# Patient Record
Sex: Male | Born: 1940 | Race: Black or African American | Hispanic: No | Marital: Married | Smoking: Former smoker
Health system: Southern US, Community
[De-identification: ages and names within clinical notes are randomized; demographics above are authoritative.]

## PROBLEM LIST (undated history)

## (undated) DIAGNOSIS — I739 Peripheral vascular disease, unspecified: Secondary | ICD-10-CM

## (undated) DIAGNOSIS — K2970 Gastritis, unspecified, without bleeding: Secondary | ICD-10-CM

## (undated) DIAGNOSIS — B9681 Helicobacter pylori [H. pylori] as the cause of diseases classified elsewhere: Secondary | ICD-10-CM

## (undated) DIAGNOSIS — N529 Male erectile dysfunction, unspecified: Secondary | ICD-10-CM

## (undated) DIAGNOSIS — N400 Enlarged prostate without lower urinary tract symptoms: Secondary | ICD-10-CM

## (undated) DIAGNOSIS — K219 Gastro-esophageal reflux disease without esophagitis: Secondary | ICD-10-CM

## (undated) DIAGNOSIS — D649 Anemia, unspecified: Secondary | ICD-10-CM

## (undated) DIAGNOSIS — D509 Iron deficiency anemia, unspecified: Secondary | ICD-10-CM

## (undated) DIAGNOSIS — Z86010 Personal history of colonic polyps: Secondary | ICD-10-CM

## (undated) DIAGNOSIS — J449 Chronic obstructive pulmonary disease, unspecified: Secondary | ICD-10-CM

## (undated) DIAGNOSIS — E785 Hyperlipidemia, unspecified: Secondary | ICD-10-CM

## (undated) DIAGNOSIS — T783XXA Angioneurotic edema, initial encounter: Secondary | ICD-10-CM

## (undated) DIAGNOSIS — Z87891 Personal history of nicotine dependence: Secondary | ICD-10-CM

## (undated) DIAGNOSIS — K589 Irritable bowel syndrome without diarrhea: Secondary | ICD-10-CM

## (undated) HISTORY — DX: Anemia, unspecified: D64.9

## (undated) HISTORY — PX: OTHER SURGICAL HISTORY: SHX169

## (undated) HISTORY — DX: Angioneurotic edema, initial encounter: T78.3XXA

## (undated) HISTORY — DX: Benign prostatic hyperplasia without lower urinary tract symptoms: N40.0

## (undated) HISTORY — DX: Peripheral vascular disease, unspecified: I73.9

## (undated) HISTORY — DX: Hyperlipidemia, unspecified: E78.5

## (undated) HISTORY — DX: Chronic obstructive pulmonary disease, unspecified: J44.9

## (undated) HISTORY — DX: Irritable bowel syndrome, unspecified: K58.9

## (undated) HISTORY — DX: Gastro-esophageal reflux disease without esophagitis: K21.9

## (undated) HISTORY — DX: Gastritis, unspecified, without bleeding: K29.70

## (undated) HISTORY — DX: Male erectile dysfunction, unspecified: N52.9

## (undated) HISTORY — DX: Personal history of nicotine dependence: Z87.891

## (undated) HISTORY — DX: Helicobacter pylori (H. pylori) as the cause of diseases classified elsewhere: B96.81

## (undated) HISTORY — DX: Iron deficiency anemia, unspecified: D50.9

## (undated) HISTORY — DX: Personal history of colonic polyps: Z86.010

## (undated) SURGERY — COLONOSCOPY
Anesthesia: Moderate Sedation

---

## 1978-04-12 HISTORY — PX: OTHER SURGICAL HISTORY: SHX169

## 1998-03-12 ENCOUNTER — Inpatient Hospital Stay (HOSPITAL_COMMUNITY): Admission: EM | Admit: 1998-03-12 | Discharge: 1998-03-13 | Payer: Self-pay | Admitting: Emergency Medicine

## 2001-08-16 ENCOUNTER — Encounter: Admission: RE | Admit: 2001-08-16 | Discharge: 2001-08-16 | Payer: Self-pay | Admitting: Urology

## 2001-08-16 ENCOUNTER — Encounter: Payer: Self-pay | Admitting: Urology

## 2001-08-17 ENCOUNTER — Ambulatory Visit (HOSPITAL_BASED_OUTPATIENT_CLINIC_OR_DEPARTMENT_OTHER): Admission: RE | Admit: 2001-08-17 | Discharge: 2001-08-17 | Payer: Self-pay | Admitting: Urology

## 2001-08-17 ENCOUNTER — Encounter: Payer: Self-pay | Admitting: Urology

## 2005-10-18 ENCOUNTER — Ambulatory Visit: Payer: Self-pay | Admitting: Internal Medicine

## 2005-10-19 ENCOUNTER — Ambulatory Visit: Payer: Self-pay | Admitting: Internal Medicine

## 2006-04-27 ENCOUNTER — Ambulatory Visit: Payer: Self-pay | Admitting: Internal Medicine

## 2006-06-02 ENCOUNTER — Ambulatory Visit: Payer: Self-pay | Admitting: Internal Medicine

## 2006-06-16 ENCOUNTER — Ambulatory Visit: Payer: Self-pay

## 2007-01-03 ENCOUNTER — Encounter: Payer: Self-pay | Admitting: Internal Medicine

## 2007-01-03 ENCOUNTER — Ambulatory Visit: Payer: Self-pay | Admitting: Internal Medicine

## 2007-01-03 DIAGNOSIS — E785 Hyperlipidemia, unspecified: Secondary | ICD-10-CM

## 2007-01-03 DIAGNOSIS — F528 Other sexual dysfunction not due to a substance or known physiological condition: Secondary | ICD-10-CM | POA: Insufficient documentation

## 2007-01-03 LAB — CONVERTED CEMR LAB
BUN: 7 mg/dL (ref 6–23)
CO2: 30 meq/L (ref 19–32)
Calcium: 9.8 mg/dL (ref 8.4–10.5)
Chloride: 104 meq/L (ref 96–112)
Cholesterol: 202 mg/dL (ref 0–200)
Creatinine, Ser: 1.1 mg/dL (ref 0.4–1.5)
Direct LDL: 138.3 mg/dL
GFR calc Af Amer: 86 mL/min
GFR calc non Af Amer: 71 mL/min
Glucose, Bld: 99 mg/dL (ref 70–99)
HDL: 45.9 mg/dL (ref >39.0)
Potassium: 4.7 meq/L (ref 3.5–5.1)
Sodium: 138 meq/L (ref 135–145)
Testosterone: 428.76 ng/dL (ref 350.00–890)
Total CHOL/HDL Ratio: 4.4
Triglycerides: 107 mg/dL (ref 0–149)
VLDL: 21 mg/dL (ref 0–40)

## 2007-02-01 ENCOUNTER — Ambulatory Visit: Payer: Self-pay | Admitting: Internal Medicine

## 2007-02-01 ENCOUNTER — Encounter: Payer: Self-pay | Admitting: Internal Medicine

## 2007-02-01 DIAGNOSIS — I10 Essential (primary) hypertension: Secondary | ICD-10-CM | POA: Insufficient documentation

## 2007-05-19 ENCOUNTER — Ambulatory Visit: Payer: Self-pay | Admitting: Internal Medicine

## 2007-05-19 DIAGNOSIS — I739 Peripheral vascular disease, unspecified: Secondary | ICD-10-CM | POA: Insufficient documentation

## 2007-05-19 DIAGNOSIS — D649 Anemia, unspecified: Secondary | ICD-10-CM

## 2007-06-09 ENCOUNTER — Ambulatory Visit: Payer: Self-pay | Admitting: Internal Medicine

## 2007-07-06 ENCOUNTER — Ambulatory Visit: Payer: Self-pay | Admitting: Internal Medicine

## 2007-10-20 ENCOUNTER — Ambulatory Visit: Payer: Self-pay | Admitting: Internal Medicine

## 2007-10-20 DIAGNOSIS — N400 Enlarged prostate without lower urinary tract symptoms: Secondary | ICD-10-CM

## 2007-10-20 DIAGNOSIS — K589 Irritable bowel syndrome without diarrhea: Secondary | ICD-10-CM

## 2007-11-10 ENCOUNTER — Ambulatory Visit: Payer: Self-pay | Admitting: Internal Medicine

## 2008-01-23 ENCOUNTER — Ambulatory Visit: Payer: Self-pay | Admitting: Internal Medicine

## 2008-02-14 ENCOUNTER — Telehealth: Payer: Self-pay | Admitting: Internal Medicine

## 2008-04-15 ENCOUNTER — Ambulatory Visit: Payer: Self-pay | Admitting: Internal Medicine

## 2008-06-07 ENCOUNTER — Telehealth (INDEPENDENT_AMBULATORY_CARE_PROVIDER_SITE_OTHER): Payer: Self-pay | Admitting: *Deleted

## 2008-06-13 ENCOUNTER — Ambulatory Visit: Payer: Self-pay | Admitting: Internal Medicine

## 2008-06-13 DIAGNOSIS — R252 Cramp and spasm: Secondary | ICD-10-CM | POA: Insufficient documentation

## 2008-09-11 ENCOUNTER — Ambulatory Visit: Payer: Self-pay | Admitting: Endocrinology

## 2008-10-02 ENCOUNTER — Telehealth (INDEPENDENT_AMBULATORY_CARE_PROVIDER_SITE_OTHER): Payer: Self-pay | Admitting: *Deleted

## 2008-10-03 ENCOUNTER — Ambulatory Visit: Payer: Self-pay

## 2008-10-03 ENCOUNTER — Encounter: Payer: Self-pay | Admitting: Cardiology

## 2008-11-12 ENCOUNTER — Telehealth: Payer: Self-pay | Admitting: Internal Medicine

## 2008-11-12 ENCOUNTER — Ambulatory Visit: Payer: Self-pay | Admitting: Internal Medicine

## 2008-11-13 ENCOUNTER — Telehealth (INDEPENDENT_AMBULATORY_CARE_PROVIDER_SITE_OTHER): Payer: Self-pay | Admitting: *Deleted

## 2008-11-26 ENCOUNTER — Ambulatory Visit: Payer: Self-pay | Admitting: Cardiovascular Disease

## 2008-11-26 DIAGNOSIS — I70219 Atherosclerosis of native arteries of extremities with intermittent claudication, unspecified extremity: Secondary | ICD-10-CM

## 2008-12-02 ENCOUNTER — Encounter: Payer: Self-pay | Admitting: Cardiovascular Disease

## 2008-12-02 ENCOUNTER — Ambulatory Visit: Payer: Self-pay

## 2008-12-06 ENCOUNTER — Ambulatory Visit: Payer: Self-pay | Admitting: Cardiovascular Disease

## 2008-12-06 ENCOUNTER — Encounter: Payer: Self-pay | Admitting: Cardiology

## 2008-12-17 ENCOUNTER — Ambulatory Visit: Payer: Self-pay | Admitting: Cardiovascular Disease

## 2008-12-18 ENCOUNTER — Ambulatory Visit (HOSPITAL_COMMUNITY): Admission: RE | Admit: 2008-12-18 | Discharge: 2008-12-18 | Payer: Self-pay | Admitting: Cardiovascular Disease

## 2008-12-18 ENCOUNTER — Ambulatory Visit: Payer: Self-pay | Admitting: Cardiovascular Disease

## 2008-12-18 LAB — CONVERTED CEMR LAB
BUN: 11 mg/dL (ref 6–23)
Basophils Absolute: 0 10*3/uL (ref 0.0–0.1)
Basophils Relative: 0.4 % (ref 0.0–3.0)
CO2: 30 meq/L (ref 19–32)
Calcium: 9.3 mg/dL (ref 8.4–10.5)
Chloride: 104 meq/L (ref 96–112)
Creatinine, Ser: 1.1 mg/dL (ref 0.4–1.5)
Eosinophils Absolute: 0.1 10*3/uL (ref 0.0–0.7)
Eosinophils Relative: 2.6 % (ref 0.0–5.0)
GFR calc non Af Amer: 85.75 mL/min (ref >60)
Glucose, Bld: 100 mg/dL — ABNORMAL HIGH (ref 70–99)
HCT: 39.3 % (ref 39.0–52.0)
Hemoglobin: 13.7 g/dL (ref 13.0–17.0)
INR: 0.9 (ref 0.8–1.0)
Lymphocytes Relative: 20.4 % (ref 12.0–46.0)
Lymphs Abs: 1.2 10*3/uL (ref 0.7–4.0)
MCHC: 34.8 g/dL (ref 30.0–36.0)
MCV: 100.5 fL — ABNORMAL HIGH (ref 78.0–100.0)
Monocytes Absolute: 0.6 10*3/uL (ref 0.1–1.0)
Monocytes Relative: 11.1 % (ref 3.0–12.0)
Neutro Abs: 3.8 10*3/uL (ref 1.4–7.7)
Neutrophils Relative %: 65.5 % (ref 43.0–77.0)
Platelets: 178 10*3/uL (ref 150.0–400.0)
Potassium: 4.3 meq/L (ref 3.5–5.1)
Prothrombin Time: 9.2 s (ref 9.1–11.7)
RBC: 3.91 M/uL — ABNORMAL LOW (ref 4.22–5.81)
RDW: 13.9 % (ref 11.5–14.6)
Sodium: 139 meq/L (ref 135–145)
WBC: 5.7 10*3/uL (ref 4.5–10.5)
aPTT: 33 s — ABNORMAL HIGH (ref 21.7–28.8)

## 2008-12-19 ENCOUNTER — Encounter: Payer: Self-pay | Admitting: Cardiovascular Disease

## 2009-01-01 ENCOUNTER — Encounter (INDEPENDENT_AMBULATORY_CARE_PROVIDER_SITE_OTHER): Payer: Self-pay | Admitting: *Deleted

## 2009-01-08 ENCOUNTER — Ambulatory Visit: Payer: Self-pay

## 2009-01-08 ENCOUNTER — Ambulatory Visit: Payer: Self-pay | Admitting: Cardiovascular Disease

## 2009-01-27 ENCOUNTER — Ambulatory Visit: Payer: Self-pay | Admitting: Cardiovascular Disease

## 2009-01-27 LAB — CONVERTED CEMR LAB
BUN: 13 mg/dL (ref 6–23)
Basophils Absolute: 0 10*3/uL (ref 0.0–0.1)
Basophils Relative: 0.5 % (ref 0.0–3.0)
CO2: 29 meq/L (ref 19–32)
Calcium: 9 mg/dL (ref 8.4–10.5)
Chloride: 102 meq/L (ref 96–112)
Creatinine, Ser: 1.1 mg/dL (ref 0.4–1.5)
Eosinophils Absolute: 0.1 10*3/uL (ref 0.0–0.7)
Eosinophils Relative: 2.9 % (ref 0.0–5.0)
GFR calc non Af Amer: 85.72 mL/min (ref >60)
Glucose, Bld: 86 mg/dL (ref 70–99)
HCT: 38.3 % — ABNORMAL LOW (ref 39.0–52.0)
Hemoglobin: 12.9 g/dL — ABNORMAL LOW (ref 13.0–17.0)
INR: 0.9 (ref 0.8–1.0)
Lymphocytes Relative: 29.4 % (ref 12.0–46.0)
Lymphs Abs: 1.2 10*3/uL (ref 0.7–4.0)
MCHC: 33.6 g/dL (ref 30.0–36.0)
MCV: 101.4 fL — ABNORMAL HIGH (ref 78.0–100.0)
Monocytes Absolute: 0.4 10*3/uL (ref 0.1–1.0)
Monocytes Relative: 10.6 % (ref 3.0–12.0)
Neutro Abs: 2.5 10*3/uL (ref 1.4–7.7)
Neutrophils Relative %: 56.6 % (ref 43.0–77.0)
Platelets: 219 10*3/uL (ref 150.0–400.0)
Potassium: 4.5 meq/L (ref 3.5–5.1)
Prothrombin Time: 9.2 s (ref 9.1–11.7)
RBC: 3.78 M/uL — ABNORMAL LOW (ref 4.22–5.81)
RDW: 13.9 % (ref 11.5–14.6)
Sodium: 140 meq/L (ref 135–145)
WBC: 4.2 10*3/uL — ABNORMAL LOW (ref 4.5–10.5)
aPTT: 32.2 s — ABNORMAL HIGH (ref 21.7–28.8)

## 2009-01-29 ENCOUNTER — Ambulatory Visit: Payer: Self-pay | Admitting: Cardiovascular Disease

## 2009-01-29 ENCOUNTER — Ambulatory Visit (HOSPITAL_COMMUNITY): Admission: RE | Admit: 2009-01-29 | Discharge: 2009-01-29 | Payer: Self-pay | Admitting: Cardiovascular Disease

## 2009-02-03 ENCOUNTER — Ambulatory Visit: Payer: Self-pay | Admitting: Internal Medicine

## 2009-02-04 ENCOUNTER — Telehealth: Payer: Self-pay | Admitting: Cardiovascular Disease

## 2009-02-05 ENCOUNTER — Telehealth (INDEPENDENT_AMBULATORY_CARE_PROVIDER_SITE_OTHER): Payer: Self-pay | Admitting: *Deleted

## 2009-02-18 ENCOUNTER — Ambulatory Visit: Payer: Self-pay | Admitting: Internal Medicine

## 2009-02-18 DIAGNOSIS — I872 Venous insufficiency (chronic) (peripheral): Secondary | ICD-10-CM | POA: Insufficient documentation

## 2009-02-19 ENCOUNTER — Encounter (INDEPENDENT_AMBULATORY_CARE_PROVIDER_SITE_OTHER): Payer: Self-pay | Admitting: *Deleted

## 2009-02-24 ENCOUNTER — Ambulatory Visit: Payer: Self-pay | Admitting: Surgery

## 2009-02-24 ENCOUNTER — Ambulatory Visit: Payer: Self-pay | Admitting: Cardiovascular Disease

## 2009-03-04 ENCOUNTER — Ambulatory Visit (HOSPITAL_COMMUNITY): Admission: RE | Admit: 2009-03-04 | Discharge: 2009-03-04 | Payer: Self-pay | Admitting: Surgery

## 2009-03-04 ENCOUNTER — Ambulatory Visit: Payer: Self-pay | Admitting: Surgery

## 2009-04-14 ENCOUNTER — Encounter: Payer: Self-pay | Admitting: Cardiovascular Disease

## 2009-04-14 ENCOUNTER — Ambulatory Visit: Payer: Self-pay | Admitting: Surgery

## 2009-05-26 ENCOUNTER — Telehealth: Payer: Self-pay | Admitting: Internal Medicine

## 2009-07-16 ENCOUNTER — Encounter: Payer: Self-pay | Admitting: Internal Medicine

## 2009-07-17 ENCOUNTER — Encounter: Payer: Self-pay | Admitting: Cardiovascular Disease

## 2009-07-17 ENCOUNTER — Ambulatory Visit: Payer: Self-pay

## 2009-07-18 ENCOUNTER — Ambulatory Visit: Payer: Self-pay | Admitting: Endocrinology

## 2009-07-18 DIAGNOSIS — M545 Low back pain, unspecified: Secondary | ICD-10-CM | POA: Insufficient documentation

## 2009-07-18 LAB — CONVERTED CEMR LAB
PSA: 2.58 ng/mL (ref 0.10–4.00)
Sed Rate: 16 mm/hr (ref 0–22)

## 2009-07-28 ENCOUNTER — Telehealth: Payer: Self-pay | Admitting: Internal Medicine

## 2009-10-14 ENCOUNTER — Ambulatory Visit: Payer: Self-pay | Admitting: Cardiovascular Disease

## 2009-10-14 ENCOUNTER — Encounter: Payer: Self-pay | Admitting: Surgery

## 2009-10-20 ENCOUNTER — Ambulatory Visit: Payer: Self-pay | Admitting: Internal Medicine

## 2009-10-20 DIAGNOSIS — G609 Hereditary and idiopathic neuropathy, unspecified: Secondary | ICD-10-CM | POA: Insufficient documentation

## 2009-10-20 LAB — CONVERTED CEMR LAB
Albumin: 3.7 g/dL (ref 3.5–5.2)
BUN: 11 mg/dL (ref 6–23)
Basophils Absolute: 0 10*3/uL (ref 0.0–0.1)
Basophils Relative: 0.8 % (ref 0.0–3.0)
CO2: 28 meq/L (ref 19–32)
Calcium: 9.4 mg/dL (ref 8.4–10.5)
Chloride: 110 meq/L (ref 96–112)
Creatinine, Ser: 1 mg/dL (ref 0.4–1.5)
Eosinophils Absolute: 0.1 10*3/uL (ref 0.0–0.7)
Eosinophils Relative: 3.2 % (ref 0.0–5.0)
Folate: 12.3 ng/mL
GFR calc non Af Amer: 98.89 mL/min (ref >60)
Glucose, Bld: 120 mg/dL — ABNORMAL HIGH (ref 70–99)
HCT: 33.3 % — ABNORMAL LOW (ref 39.0–52.0)
Hemoglobin: 11.1 g/dL — ABNORMAL LOW (ref 13.0–17.0)
Lymphocytes Relative: 29.5 % (ref 12.0–46.0)
Lymphs Abs: 1 10*3/uL (ref 0.7–4.0)
MCHC: 33.3 g/dL (ref 30.0–36.0)
MCV: 90.6 fL (ref 78.0–100.0)
Monocytes Absolute: 0.4 10*3/uL (ref 0.1–1.0)
Monocytes Relative: 12.2 % — ABNORMAL HIGH (ref 3.0–12.0)
Neutro Abs: 1.9 10*3/uL (ref 1.4–7.7)
Neutrophils Relative %: 54.3 % (ref 43.0–77.0)
Phosphorus: 2.7 mg/dL (ref 2.3–4.6)
Platelets: 241 10*3/uL (ref 150.0–400.0)
Potassium: 4.5 meq/L (ref 3.5–5.1)
RBC: 3.68 M/uL — ABNORMAL LOW (ref 4.22–5.81)
RDW: 17.3 % — ABNORMAL HIGH (ref 11.5–14.6)
Sodium: 141 meq/L (ref 135–145)
TSH: 0.97 microintl units/mL (ref 0.35–5.50)
Vitamin B-12: 421 pg/mL (ref 211–911)
WBC: 3.6 10*3/uL — ABNORMAL LOW (ref 4.5–10.5)

## 2010-03-20 ENCOUNTER — Ambulatory Visit: Payer: Self-pay | Admitting: Internal Medicine

## 2010-03-20 ENCOUNTER — Telehealth: Payer: Self-pay | Admitting: Internal Medicine

## 2010-03-25 ENCOUNTER — Ambulatory Visit: Payer: Self-pay | Admitting: Internal Medicine

## 2010-03-26 ENCOUNTER — Encounter: Payer: Self-pay | Admitting: Internal Medicine

## 2010-03-31 ENCOUNTER — Encounter: Payer: Self-pay | Admitting: Internal Medicine

## 2010-04-01 ENCOUNTER — Telehealth: Payer: Self-pay | Admitting: Internal Medicine

## 2010-04-08 ENCOUNTER — Ambulatory Visit: Payer: Self-pay | Admitting: Pulmonary Disease

## 2010-04-08 DIAGNOSIS — J449 Chronic obstructive pulmonary disease, unspecified: Secondary | ICD-10-CM

## 2010-04-08 DIAGNOSIS — J4489 Other specified chronic obstructive pulmonary disease: Secondary | ICD-10-CM | POA: Insufficient documentation

## 2010-05-12 NOTE — Assessment & Plan Note (Signed)
Summary: f1m per check/rsc from [facility] in May/lg   Visit Type:  Follow-up Primary Provider:  Illene Regulus  CC:  No complaints.  History of Present Illness: 70 yo AAM with h/o hyperlipidemia, tobacco abuse and PVD followed in our PV clinic. He had abnormal ABI studies in 2008 with both in mild to moderate range. (0.75 right, 0.81 left). He was started on Pletal but his symptoms did not improve.  He continued to smoke. Pain in both legs recently worsened.  Cramping at night, not clearly rest pain but present occasionally. No ulcerations or loss of sensation. 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 Lower extremity angiography performed on December 18, 2008 and at that time, he was found to have total occlusions of both mid SFAs. I was able to open the CTO of the left SFA and placed 2 self expanding stents in the mid to distal SFA into the left popliteal. I planned on staging the PTA of the right SFA at a later date.  I then attempted to open the CTO of the right SFA on 01/29/09 but was unable to cross the stenosis with a wire. I discussed his case with Dr. Myra Gianotti of Vascular Surgery. He was  limited by right leg pain and is unable to get through his activities of daily living without severe pain. He was seen by Dr. Myra Gianotti and PTA was arranged. Dr. Myra Gianotti was able to open the CTO of the right SFA  and placed two stents. He has been doing great. No pain in either leg. His hips get "tired" at the end of the day. No rest pain or ulcerations. He has been walking without any difficulty.  He has completely stopped smoking.    Current Medications (verified): 1)  Aspirin Ec Low Dose 81 Mg  Tbec (Aspirin) .... Once Daily 2)  Cialis 20 Mg  Tabs (Tadalafil) .... As Needed 3)  Doxazosin Mesylate 4 Mg  Tabs (Doxazosin Mesylate) .... Take One Tablet By Mouth Twice Daily. 4)  Gabapentin 100 Mg Caps (Gabapentin) .Marland Kitchen.. 1 Caps  1 Hour Before Bedtime For Nocturnal Leg Cramps 5)  Plavix 75 Mg Tabs (Clopidogrel Bisulfate) .... Take One Tablet By Mouth Daily 6)  Vitamin C 100 Mg Tabs (Ascorbic Acid) 7)  Vitamin D3 1000 Unit  Tabs (Cholecalciferol) .Marland Kitchen.. 1 By Mouth Daily 8)  Hydrocodone-Acetaminophen 10-325 Mg Tabs (Hydrocodone-Acetaminophen) .Marland Kitchen.. 1 Every 4 Hrs As Needed For Pain  Allergies (verified): 1)  ! Penicillin 2)  ! Ace Inhibitors  Past History:  Past Medical History: PERIPHERAL VASCULAR DISEASE (ICD-443.9) s/p PTA/stent left SFA 9/10, right SFA PTA/stent 02/2009 CHEST PAIN (ICD-786.50) ANGIOEDEMA (ICD-995.1) ESSENTIAL HYPERTENSION (ICD-401.9) HYPERLIPIDEMIA (ICD-272.4) HX, PERSONAL, TOBACCO USE (ICD-V15.82) CALF CRAMPS, NOCTURNAL (ICD-729.82) DIARRHEA (ICD-787.91) IRRITABLE BOWEL SYNDROME (ICD-564.1) BENIGN PROSTATIC HYPERTROPHY, WITH URINARY OBSTRUCTION (ICD-600.01) ANEMIA-NOS (ICD-285.9) PHARYNGITIS, ACUTE (ICD-462) IMPOTENCE (ICD-302.72)  Review of Systems  The patient denies fatigue, malaise, fever, weight gain/loss, vision loss, decreased hearing, hoarseness, chest pain, palpitations, shortness of breath, prolonged cough, wheezing, sleep apnea, coughing up blood, abdominal pain, blood in stool, nausea, vomiting, diarrhea, heartburn, incontinence, blood in urine, muscle weakness, joint pain, leg swelling, rash, skin lesions, headache, fainting, dizziness, depression, anxiety, enlarged lymph nodes, easy bruising or bleeding, and environmental allergies.    Vital Signs:  Patient profile:   70 year old male Height:      69 inches Weight:      186 pounds BMI:  27.57 Pulse rate:   58 / minute Resp:     18 per minute BP sitting:   142 / 82  (left arm)  Vitals Entered By: Marrion Coy, CNA (October 14, 2009 1:51 PM)  Physical Exam  General:  General: Well developed, well nourished, NAD HEENT: OP clear, mucus membranes moist SKIN: warm, dry Neuro: No focal deficits Musculoskeletal: Muscle  strength 5/5 all ext Psychiatric: Mood and affect normal Neck: No JVD CV: RRR no murmurs, gallops rubs Abdomen: soft, NT, ND, BS present Extremities: No edema, pulses 1+ bilateral DP/PT    Arterial Doppler  Procedure date:  10/14/2009  Findings:      ABI 1.0 on right, 0.99 on left.   Impression & Recommendations:  Problem # 1:  PERIPHERAL VASCULAR DISEASE (ICD-443.9) Stable. ABI normal. No claudication. Repeat ABI in 6 months. Continue ASA and Plavix and walking daily.   Patient Instructions: 1)  Your physician recommends that you schedule a follow-up appointment in: 12 months 2)  Your physician has requested that you have an ankle brachial index (ABI). During this test an ultrasound and blood pressure cuff are used to evaluate the arteries that supply the arms and legs with blood. Allow thirty minutes for this exam. There are no restrictions or special instructions. To be done in 6 months

## 2010-05-12 NOTE — Progress Notes (Signed)
  Phone Note Call from Patient Call back at Home Phone [phone]   Caller: Patient--walkin Call For: Jacques Navy MD Summary of Call: Pt walked in,requests to be seen today for cramps in hand & feet. Pt states he has given him pills to treat this before. Initial call taken by: Verdell Face,  March 20, 2010 10:31 AM  Follow-up for Phone Call        office visit today, ok per md Follow-up by: Lamar Sprinkles, CMA,  March 20, 2010 11:04 AM

## 2010-05-12 NOTE — Assessment & Plan Note (Signed)
Summary: SWOLLEN FEET WITH BURNING/NWS   Vital Signs:  Patient profile:   70 year old male Height:      69 inches Weight:      189 pounds BMI:     28.01 O2 Sat:      98 % on Room air Temp:     96.9 degrees F oral Pulse rate:   89 / minute BP sitting:   124 / 70  (left arm) Cuff size:   regular  Vitals Entered By: Bill Salinas CMA (October 20, 2009 9:21 AM)  O2 Flow:  Room air CC: pt here with c/o swelling and tingling in his feet x 2 days/ ab   Primary Care Provider:  Illene Regulus  CC:  pt here with c/o swelling and tingling in his feet x 2 days/ ab.  History of Present Illness: Awoke in the AM with swollen feet. The swelling went down with elevation. He also feels tingling in the feet. No swelling this AM. No lab since October '10. Renal function was normal. On CBC MCV was elevated.  Patient with h/o PVD s/p bilateral LE PCI/stenting with good results. Recent f/o Dr. Sanjuana Kava - doing well with normal ABI's  Current Medications (verified): 1)  Aspirin Ec Low Dose 81 Mg  Tbec (Aspirin) .... Once Daily 2)  Cialis 20 Mg  Tabs (Tadalafil) .... As Needed 3)  Doxazosin Mesylate 4 Mg  Tabs (Doxazosin Mesylate) .... Take One Tablet By Mouth Twice Daily. 4)  Gabapentin 100 Mg Caps (Gabapentin) .Marland Kitchen.. 1 Caps 1 Hour Before Bedtime For Nocturnal Leg Cramps 5)  Plavix 75 Mg Tabs (Clopidogrel Bisulfate) .... Take One Tablet By Mouth Daily 6)  Vitamin C 100 Mg Tabs (Ascorbic Acid) 7)  Vitamin D3 1000 Unit  Tabs (Cholecalciferol) .Marland Kitchen.. 1 By Mouth Daily 8)  Hydrocodone-Acetaminophen 10-325 Mg Tabs (Hydrocodone-Acetaminophen) .Marland Kitchen.. 1 Every 4 Hrs As Needed For Pain  Allergies (verified): 1)  ! Penicillin 2)  ! Ace Inhibitors  Past History:  Past Medical History: Last updated: 10/14/2009 PERIPHERAL VASCULAR DISEASE (ICD-443.9) s/p PTA/stent left SFA 9/10, right SFA PTA/stent 02/2009 CHEST PAIN (ICD-786.50) ANGIOEDEMA (ICD-995.1) ESSENTIAL HYPERTENSION (ICD-401.9) HYPERLIPIDEMIA  (ICD-272.4) HX, PERSONAL, TOBACCO USE (ICD-V15.82) CALF CRAMPS, NOCTURNAL (ICD-729.82) DIARRHEA (ICD-787.91) IRRITABLE BOWEL SYNDROME (ICD-564.1) BENIGN PROSTATIC HYPERTROPHY, WITH URINARY OBSTRUCTION (ICD-600.01) ANEMIA-NOS (ICD-285.9) PHARYNGITIS, ACUTE (ICD-462) IMPOTENCE (ICD-302.72)  Past Surgical History: Last updated: 01/01/2009 Abdominal surgery for ulcers 1. Distal aortogram with bilateral lower extremity runoff.  2. Percutaneous transluminal angioplasty with placement of 2 self- expanding stents in the distal left superficial femoral artery extending into the left popliteal artery.Verne Carrow, MD. 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  The patient denies anorexia, weight loss, chest pain, dyspnea on exertion, abdominal pain, muscle weakness, and difficulty walking.    Physical Exam  General:  WNWD AA male in no distress Head:  normocephalic and atraumatic.   Eyes:  C&S clear Lungs:  Normal respiratory effort, chest expands symmetrically. Lungs are clear to auscultation, no crackles or wheezes. Heart:  Normal rate and regular rhythm. S1 and S2 normal without gallop, murmur, click, rub or other extra sounds. Pulses:  2+ DP pulses Extremities:  trace to 1+ edema distal LE left > right Skin:  turgor normal and color normal.   Psych:  normally interactive, good eye contact, and  not anxious appearing.     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 Possible B12 deficiency vs thyroid disease vs other  Plan - B12 level, TSH  Orders: TLB-Renal Function Panel (80069-RENAL) TLB-B12 + Folate Pnl (16109_60454-U98/JXB) TLB-TSH (Thyroid Stimulating Hormone) (84443-TSH) TLB-CBC Platelet - w/Differential (85025-CBCD)  Addendum  = normal labs  Tests: (2) B12 + Folate Panel (B12/FOL)   Vitamin B12               421 pg/mL                   211-911   Folate                    12.3 ng/mL     Deficient  0.4 - 3.4 ng/mL     Indeterminate  3.4 - 5.4 ng/mL     Normal  >5.4 ng/mL  Tests: (3) TSH (TSH)   FastTSH                   0.97 uIU/mL                 0.35-5.50  Problem # 2:  VENOUS INSUFFICIENCY, LEFT LEG (ICD-459.81) H/o venous insufficiency but has had recent episode of AM ankle edema. Exam is unrevealing.  Plan - r/o renal disease with metabolic panel.  Addendum - renal function normal.  Problem # 3:  PERIPHERAL VASCULAR DISEASE (ICD-443.9) S/P PCI with stents - doing well. No symptoms of claudication, normal ABI's. He has quit smoking!!!  Problem # 4:  ESSENTIAL HYPERTENSION (ICD-401.9)  His updated medication list for this problem includes:    Doxazosin Mesylate 4 Mg Tabs (Doxazosin mesylate) .Marland Kitchen... Take one tablet by mouth twice daily.  BP today: 124/70 Prior BP: 142/82 (10/14/2009)  Good control on present medications.   Complete Medication List: 1)  Aspirin Ec Low Dose 81 Mg Tbec (Aspirin) .... Once daily 2)  Cialis 20 Mg Tabs (Tadalafil) .... As needed 3)  Doxazosin Mesylate 4 Mg Tabs (Doxazosin mesylate) .... Take one tablet by mouth twice daily. 4)  Gabapentin 100 Mg Caps (Gabapentin) .Marland Kitchen.. 1 caps 1 hour before bedtime for nocturnal leg cramps 5)  Plavix 75 Mg Tabs (Clopidogrel bisulfate) .... Take one tablet by mouth daily 6)  Vitamin C 100 Mg Tabs (Ascorbic acid) 7)  Vitamin D3 1000 Unit Tabs (Cholecalciferol) .Marland Kitchen.. 1 by mouth daily 8)  Hydrocodone-acetaminophen 10-325 Mg Tabs (Hydrocodone-acetaminophen) .Marland Kitchen.. 1 every 4 hrs as needed for pain   Preventive Care Screening  Last Flu Shot:    Date:  10/20/2009    Results:  Declined  Last Tetanus Booster:    Date:  07/09/2007    Results:  Historical

## 2010-05-12 NOTE — Progress Notes (Signed)
  Phone Note Refill Request Message from:  Fax from Pharmacy on July 28, 2009 1:48 PM  Refills Requested: Medication #1:  GABAPENTIN 100 MG CAPS 1 caps 1 hour before bedtime for nocturnal leg cramps Initial call taken by: Rock Nephew CMA,  July 28, 2009 1:48 PM    Prescriptions: GABAPENTIN 100 MG CAPS (GABAPENTIN) 1 caps 1 hour before bedtime for nocturnal leg cramps  #60 x 6   Entered by:   Rock Nephew CMA   Authorized by:   Jacques Navy MD   Signed by:   Rock Nephew CMA on 07/28/2009   Method used:   Electronically to        [facility] [phone]* (retail)       [address]       [place], [place]  [address]       Ph: [phone] or [phone]       Fax: [phone]   RxID:   [id number]

## 2010-05-12 NOTE — Miscellaneous (Signed)
Summary: Orders Update  Clinical Lists Changes  Orders: Added new Test order of Arterial Duplex Lower Extremity (Arterial Duplex Low) - Signed 

## 2010-05-12 NOTE — Letter (Signed)
Summary: [facility]   Imported By: Roderic Ovens 04/23/2009 15:20:29  _____________________________________________________________________  External Attachment:    Type:   Image     Comment:   External Document  Appended Document: [facility]  Pat, Can we make sure that Mr. Chauca has surveillance lower extremity arterial dopplers arranged for April 2011. He will need these every three months for one year per request of Dr. Myra Gianotti. Thanks, CDM  Appended Document: [facility]  Pt. notified of need for LEA doppler in April 2011.  Pt. transferred to scheduler and appt made for 07/17/09 for this.

## 2010-05-12 NOTE — Letter (Signed)
[facility] [address] [place], [place]  [address] Phone: [phone]      October 26, 2009   Henry Carter [address] [place], [place] [address]  RE:  LAB RESULTS  Dear  Mr. Henry Carter,  The following is an interpretation of your most recent lab tests.  Please take note of any instructions provided or changes to medications that have resulted from your lab work.  ELECTROLYTES:  Good - no changes needed  KIDNEY FUNCTION TESTS:  Good - no changes needed   THYROID STUDIES:  Thyroid studies normal TSH: 0.97     B12 and folate normal   Labs are normal. Tingling my be a result of peripheral vascular disease or it my be idiopathic (no ready medical explanation). For worseing symptoms may need further testing.    Sincerely Yours,    Jacques Navy MD  Patient: Henry Carter Note: All result statuses are Final unless otherwise noted.  Tests: (1) Renal Function Panel (RENAL)   Sodium                    141 mEq/L                   135-145   Potassium                 4.5 mEq/L                   3.5-5.1   Chloride                  110 mEq/L                   96-112   Carbon Dioxide            28 mEq/L                    19-32   Calcium                   9.4 mg/dL                   4.0-34.7   Albumin                   3.7 g/dL                    4.2-5.9   BUN                       11 mg/dL                    5-63   Creatinine                1.0 mg/dL                   8.7-5.6   Glucose              [H]  120 mg/dL                   43-32   Phosphorus                2.7 mg/dL                   9.5-1.8   GFR  98.89 mL/min                >60  Tests: (2) B12 + Folate Panel (B12/FOL)   Vitamin B12               421 pg/mL                   211-911   Folate                    12.3 ng/mL     Deficient  0.4 - 3.4 ng/mL     Indeterminate  3.4 - 5.4 ng/mL     Normal  >5.4 ng/mL  Tests: (3) TSH (TSH)   FastTSH                    0.97 uIU/mL                 0.35-5.50  Tests: (4) CBC Platelet w/Diff (CBCD)   White Cell Count     [L]  3.6 K/uL                    4.5-10.5   Red Cell Count       [L]  3.68 Mil/uL                 4.22-5.81   Hemoglobin           [L]  11.1 g/dL                   69.6-29.5   Hematocrit           [L]  33.3 %                      39.0-52.0   MCV                       90.6 fl                     78.0-100.0   MCHC                      33.3 g/dL                   28.4-13.2   RDW                  [H]  17.3 %                      11.5-14.6   Platelet Count            241.0 K/uL                  150.0-400.0   Neutrophil %              54.3 %                      43.0-77.0   Lymphocyte %              29.5 %                      12.0-46.0   Monocyte %           [H]  12.2 %  3.0-12.0   Eosinophils%              3.2 %                       0.0-5.0   Basophils %               0.8 %                       0.0-3.0   Neutrophill Absolute      1.9 K/uL                    1.4-7.7   Lymphocyte Absolute       1.0 K/uL                    0.7-4.0   Monocyte Absolute         0.4 K/uL                    0.1-1.0  Eosinophils, Absolute                             0.1 K/uL                    0.0-0.7   Basophils Absolute        0.0 K/uL                    0.0-0.1

## 2010-05-12 NOTE — Assessment & Plan Note (Signed)
Summary: BACK PROBLEM/NWS   Vital Signs:  Patient profile:   70 year old male Height:      69 inches (175.26 cm) Weight:      184.13 pounds (83.70 kg) O2 Sat:      97 % on Room air Temp:     97.4 degrees F (36.33 degrees C) oral Pulse rate:   91 / minute BP sitting:   128 / 82  (left arm) Cuff size:   regular  Vitals Entered By: Josph Macho RMA (July 18, 2009 2:54 PM)  O2 Flow:  Room air CC: Back problems X1week/ pt states he fell twice this morning/ CF Is Patient Diabetic? No   Primary Provider:  Illene Regulus  CC:  Back problems X1week/ pt states he fell twice this morning/ CF.  History of Present Illness: pt states few days of pain at the lower back.  no associated numbness.  no injury.    Current Medications (verified): 1)  Aspirin Ec Low Dose 81 Mg  Tbec (Aspirin) .... Once Daily 2)  Cialis 20 Mg  Tabs (Tadalafil) .... As Needed 3)  Doxazosin Mesylate 4 Mg  Tabs (Doxazosin Mesylate) .... Take One Tablet By Mouth Twice Daily. 4)  Gabapentin 100 Mg Caps (Gabapentin) .Marland Kitchen.. 1 Caps 1 Hour Before Bedtime For Nocturnal Leg Cramps 5)  Plavix 75 Mg Tabs (Clopidogrel Bisulfate) .... Take One Tablet By Mouth Daily 6)  Vitamin C 100 Mg Tabs (Ascorbic Acid) 7)  Vitamin D3 1000 Unit  Tabs (Cholecalciferol) .Marland Kitchen.. 1 By Mouth Daily  Allergies (verified): 1)  ! Penicillin 2)  ! Ace Inhibitors  Past History:  Past Medical History: Last updated: 02/24/2009 PERIPHERAL VASCULAR DISEASE (ICD-443.9) s/p PTA/stent left SFA 9/10. CHEST PAIN (ICD-786.50) ANGIOEDEMA (ICD-995.1) ESSENTIAL HYPERTENSION (ICD-401.9) HYPERLIPIDEMIA (ICD-272.4) HX, PERSONAL, TOBACCO USE (ICD-V15.82) CALF CRAMPS, NOCTURNAL (ICD-729.82) DIARRHEA (ICD-787.91) IRRITABLE BOWEL SYNDROME (ICD-564.1) BENIGN PROSTATIC HYPERTROPHY, WITH URINARY OBSTRUCTION (ICD-600.01) ANEMIA-NOS (ICD-285.9) PHARYNGITIS, ACUTE (ICD-462) IMPOTENCE (ICD-302.72)  Review of Systems       denies decreased bowel or bladder  function.  Physical Exam  General:  normal appearance.   Msk:  back is nontender.  Neurologic:  sensation is intact to touch throughout the lower extremities.   Impression & Recommendations:  Problem # 1:  BACK PAIN, LUMBAR (ICD-724.2)  Medications Added to Medication List This Visit: 1)  Hydrocodone-acetaminophen 10-325 Mg Tabs (Hydrocodone-acetaminophen) .Marland Kitchen.. 1 every 4 hrs as needed for pain  Other Orders: TLB-Sedimentation Rate (ESR) (85652-ESR) TLB-PSA (Prostate Specific Antigen) (84153-PSA) T-Pelvis 1or 2 views (72170TC) T-Lumbar Spine 2 Views (72100TC) Est. Patient Level III (01027)  Patient Instructions: 1)  blood test, and x rays today. 2)  tests are being ordered for you today.  a few days after the test(s), please call [phone] to hear your test results. 3)  vicodin 10/325.  1 every 4 hrs as needed for pain. 4)  call dr Debby Bud next week if not feeling better. 5)  (update: i left message on phone-tree:  rx as we discussed.  x rays show osteoarthritis). Prescriptions: HYDROCODONE-ACETAMINOPHEN 10-325 MG TABS (HYDROCODONE-ACETAMINOPHEN) 1 every 4 hrs as needed for pain  #50 x 1   Entered and Authorized by:   Minus Breeding MD   Signed by:   Minus Breeding MD on 07/18/2009   Method used:   Print then Give to Patient   RxID:   [id number]

## 2010-05-12 NOTE — Progress Notes (Signed)
Summary: Doxazosin refill  Phone Note Refill Request Message from:  Fax from Pharmacy on May 26, 2009 4:12 PM  Refills Requested: Medication #1:  DOXAZOSIN MESYLATE 4 MG  TABS Take one tablet by mouth twice daily. Initial call taken by: Lucious Groves,  May 26, 2009 4:12 PM    Prescriptions: DOXAZOSIN MESYLATE 4 MG  TABS (DOXAZOSIN MESYLATE) Take one tablet by mouth twice daily.  #60 x 3   Entered by:   Lucious Groves   Authorized by:   Jacques Navy MD   Signed by:   Lucious Groves on 05/26/2009   Method used:   [facility] to        [facility] [phone][facility] (retail)       [address]       [place], [place]  [address]       Ph: [phone] or [phone]       Fax: [phone]   RxID:   [id number]

## 2010-05-14 NOTE — Assessment & Plan Note (Signed)
Summary: SOB/LED   Visit Type:  Initial Consult Copy to:  pcp Primary Provider/Referring Provider:  Illene Regulus  CC:  Pulmonary consult.  History of Present Illness: 70/M, ex smoker with DOE, quit smoking nov'10 - 25 Pyrs Had stents put in B LEs - claudication better Smokes marijuana - a blunt/ day - coughs white phlegm when he does this, No wheeze, PND, orthopnea  No recurrent colds, chest pain. His wife continues to smoke CXR  -hyperaeration PFTs - mild airway obstruction FEV1 70%,no BD response,  nml lung volumes,  preserved DLCO.   Preventive Screening-Counseling & Management  Alcohol-Tobacco     Smoking Status: quit < 6 months     Packs/Day: 0.5     Year Started: 1958     Year Quit: 01/2009  Current Medications (verified): 1)  Aspirin Ec Low Dose 81 Mg  Tbec (Aspirin) .... Once Daily 2)  Cialis 20 Mg  Tabs (Tadalafil) .... As Needed 3)  Doxazosin Mesylate 4 Mg  Tabs (Doxazosin Mesylate) .... Take One Tablet By Mouth Twice Daily. 4)  Gabapentin 300 Mg Caps (Gabapentin) .Marland Kitchen.. 1 By Mouth Three Times A Day 5)  Plavix 75 Mg Tabs (Clopidogrel Bisulfate) .... Take One Tablet By Mouth Daily 6)  Vitamin C 100 Mg Tabs (Ascorbic Acid) 7)  Vitamin D3 1000 Unit  Tabs (Cholecalciferol) .Marland Kitchen.. 1 By Mouth Daily 8)  Hydrocodone-Acetaminophen 10-325 Mg Tabs (Hydrocodone-Acetaminophen) .Marland Kitchen.. 1 Every 4 Hrs As Needed For Pain  Allergies (verified): 1)  ! Penicillin 2)  ! Ace Inhibitors  Past History:  Past Medical History: Last updated: 10/14/2009 PERIPHERAL VASCULAR DISEASE (ICD-443.9) s/p PTA/stent left SFA 9/10, right SFA PTA/stent 02/2009 CHEST PAIN (ICD-786.50) ANGIOEDEMA (ICD-995.1) ESSENTIAL HYPERTENSION (ICD-401.9) HYPERLIPIDEMIA (ICD-272.4) HX, PERSONAL, TOBACCO USE (ICD-V15.82) CALF CRAMPS, NOCTURNAL (ICD-729.82) DIARRHEA (ICD-787.91) IRRITABLE BOWEL SYNDROME (ICD-564.1) BENIGN PROSTATIC HYPERTROPHY, WITH URINARY OBSTRUCTION (ICD-600.01) ANEMIA-NOS  (ICD-285.9) PHARYNGITIS, ACUTE (ICD-462) IMPOTENCE (ICD-302.72)  Past Surgical History: Last updated: 01/01/2009 Abdominal surgery for ulcers 1. Distal aortogram with bilateral lower extremity runoff.  2. Percutaneous transluminal angioplasty with placement of 2 self- expanding stents in the distal left superficial femoral artery extending into the left popliteal artery.Verne Carrow, MD. 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  The patient denies shortness of breath at rest, non-productive cough, coughing up blood, chest pain, irregular heartbeats, indigestion, loss of appetite, abdominal pain, difficulty swallowing, sore throat, headaches, nasal congestion/difficulty breathing through nose, sneezing, itching, ear ache, anxiety, depression, rash, change in color of mucus, and fever.    Vital Signs:  Patient profile:   70 year old male Height:      69 inches Weight:      192.4 pounds BMI:     28.52 O2 Sat:      95 % on Room air Temp:     98.1 degrees F oral Pulse rate:   93 / minute BP sitting:   138 / 82  (left arm) Cuff size:   regular  Vitals Entered By: Zackery Barefoot CMA (April 08, 2010 10:57 AM)  O2 Flow:  Room  air CC: Pulmonary consult Comments Medications reviewed with patient Verified contact number and pharmacy with patient Zackery Barefoot CMA  April 08, 2010 11:02 AM    Physical Exam  Additional Exam:  Gen. Pleasant, thin, in no distress, normal  affect ENT - no lesions, no post nasal drip Neck: No JVD, no thyromegaly, no carotid bruits Lungs: no use of accessory muscles, no dullness to percussion, clear without rales or rhonchi  Cardiovascular: Rhythm regular, heart sounds  normal, no murmurs or gallops, no peripheral edema Abdomen: soft and non-tender, no hepatosplenomegaly, BS normal. Musculoskeletal: No deformities, no cyanosis or clubbing Neuro:  alert, non focal     Impression & Recommendations:  Problem # 1:  C O P D (ICD-496) Gold stg 2 Trial of spiriva- discussed side effects - he will call for Rx if this owrks I have asked him to cut down/ quit smoking marijuana Pulm rehab referral  Problem # 2:  DYSPNEA ON EXERTION (ICD-786.09)  Likely a combination of COPD & PVD  Orders: Consultation Level III (04540) Rehabilitation Referral (Rehab)  Patient Instructions: 1)  Copy sent to: dr Debby Bud 2)  Please schedule a follow-up appointment in 6 weeks. 3)  We have recommended that you participate in Pulmonary Rehabilitation Program.   4)  trial of spiriva - call me if this works

## 2010-05-14 NOTE — Progress Notes (Signed)
Summary: REQ FOR RX   Phone Note Call from Patient   Caller: Steward Drone Summary of Call: Steward Drone left vm for pt: Pt was advised to increase gabapentin and needs new rx to reflect changes. I do not see changes after looking at office notes, what is ok to fill?  Initial call taken by: Lamar Sprinkles, CMA,  April 01, 2010 3:20 PM  Follow-up for Phone Call        gabapentin 300mg : 1 at bedtime x 3 days, 1 two times a day x 3 days and then 1 three times a day. # 90, refill as needed. Follow-up by: Jacques Navy MD,  April 01, 2010 5:44 PM  Additional Follow-up for Phone Call Additional follow up Details #1::        Pt informed  Additional Follow-up by: Lamar Sprinkles, CMA,  April 01, 2010 6:19 PM    New/Updated Medications: GABAPENTIN 300 MG CAPS (GABAPENTIN) 1 by mouth three times a day Prescriptions: GABAPENTIN 300 MG CAPS (GABAPENTIN) 1 by mouth three times a day  #90 x 3   Entered and Authorized by:   Jacques Navy MD   Signed by:   Jacques Navy MD on 04/01/2010   Method used:   Electronically to        [facility] [phone]* (retail)       [address]       [place], [place]  [address]       Ph: [phone] or [phone]       Fax: [phone]   RxID:   [id number]

## 2010-05-14 NOTE — Miscellaneous (Signed)
Summary: Orders Update  Clinical Lists Changes  Orders: Added new Referral order of Pulmonary Referral (Pulmonary) - Signed 

## 2010-05-14 NOTE — Miscellaneous (Signed)
Summary: Orders Update pft charges  Clinical Lists Changes  Orders: Added new Service order of Carbon Monoxide diffusing w/capacity (94720) - Signed Added new Service order of Lung Volumes (94240) - Signed Added new Service order of Spirometry (Pre & Post) (94060) - Signed 

## 2010-05-14 NOTE — Assessment & Plan Note (Signed)
Summary: cramps hand,feet/ok Henry Carter/cd   Vital Signs:  Patient profile:   70 year old male Height:      69 inches Weight:      186 pounds BMI:     27.57 O2 Sat:      97 % on Room air Temp:     97.2 degrees F oral Pulse rate:   79 / minute BP sitting:   130 / 72  (left arm) Cuff size:   regular  Vitals Entered By: Bill Salinas CMA (March 20, 2010 11:18 AM)  O2 Flow:  Room air CC: pt here with c/o cramps in legs and feet he states cramps worse at night  when sleeping. He states gabapentin not as effective as it used to be/ ab   Primary Care Callen Zuba:  Illene Regulus  CC:  pt here with c/o cramps in legs and feet he states cramps worse at night  when sleeping. He states gabapentin not as effective as it used to be/ ab.  History of Present Illness: Patinet presents for follow -up. His chief complaint is nocturnal leg cramps that are getting worse. He has been taking gabapentin 100mg   at bedtime which isn't working like it used to.   Patient's primary concern is DOE which has gotten progressively worse. He has been not smoking for a year. Chart reviewed: no recent CXR, NST June '10 with a low risk study. He has had claudication and is s/p intevention and remains on plavix.  Current Medications (verified): 1)  Aspirin Ec Low Dose 81 Mg  Tbec (Aspirin) .... Once Daily 2)  Cialis 20 Mg  Tabs (Tadalafil) .... As Needed 3)  Doxazosin Mesylate 4 Mg  Tabs (Doxazosin Mesylate) .... Take One Tablet By Mouth Twice Daily. 4)  Gabapentin 100 Mg Caps (Gabapentin) .Marland Kitchen.. 1 Caps 1 Hour Before Bedtime For Nocturnal Leg Cramps 5)  Plavix 75 Mg Tabs (Clopidogrel Bisulfate) .... Take One Tablet By Mouth Daily 6)  Vitamin C 100 Mg Tabs (Ascorbic Acid) 7)  Vitamin D3 1000 Unit  Tabs (Cholecalciferol) .Marland Kitchen.. 1 By Mouth Daily 8)  Hydrocodone-Acetaminophen 10-325 Mg Tabs (Hydrocodone-Acetaminophen) .Marland Kitchen.. 1 Every 4 Hrs As Needed For Pain  Allergies (verified): 1)  ! Penicillin 2)  ! Ace Inhibitors  Past  History:  Past Medical History: Last updated: 10/14/2009 PERIPHERAL VASCULAR DISEASE (ICD-443.9) s/p PTA/stent left SFA 9/10, right SFA PTA/stent 02/2009 CHEST PAIN (ICD-786.50) ANGIOEDEMA (ICD-995.1) ESSENTIAL HYPERTENSION (ICD-401.9) HYPERLIPIDEMIA (ICD-272.4) HX, PERSONAL, TOBACCO USE (ICD-V15.82) CALF CRAMPS, NOCTURNAL (ICD-729.82) DIARRHEA (ICD-787.91) IRRITABLE BOWEL SYNDROME (ICD-564.1) BENIGN PROSTATIC HYPERTROPHY, WITH URINARY OBSTRUCTION (ICD-600.01) ANEMIA-NOS (ICD-285.9) PHARYNGITIS, ACUTE (ICD-462) IMPOTENCE (ICD-302.72)  Past Surgical History: Last updated: 01/01/2009 Abdominal surgery for ulcers 1. Distal aortogram with bilateral lower extremity runoff.  2. Percutaneous transluminal angioplasty with placement of 2 self- expanding stents in the distal left superficial femoral artery extending into the left popliteal artery.Verne Carrow, MD. 12/18/2008  FH reviewed for relevance, SH/Risk Factors reviewed for relevance  Review of Systems       The patient complains of dyspnea on exertion and peripheral edema.  The patient denies anorexia, fever, weight loss, hoarseness, chest pain, syncope, prolonged cough, headaches, abdominal pain, severe indigestion/heartburn, muscle weakness, suspicious skin lesions, difficulty walking, abnormal bleeding, and enlarged lymph nodes.    Physical Exam  General:  Well-developed,well-nourished,in no acute distress; alert,appropriate and cooperative throughout examination Head:  Normocephalic and atraumatic without obvious abnormalities. No apparent alopecia or balding. Lungs:  normal respiratory effort, no intercostal retractions, no accessory muscle use,  normal breath sounds, no crackles, and no wheezes.   Heart:  normal rate and regular rhythm.   Msk:  no joint tenderness, no joint swelling, and no redness over joints.   Pulses:  2+ radial Neurologic:  alert & oriented X3 and gait normal.   Skin:  turgor normal and color  normal.   Cervical Nodes:  no anterior cervical adenopathy and no posterior cervical adenopathy.   Psych:  Oriented X3, normally interactive, and good eye contact.     Impression & Recommendations:  Problem # 1:  DYSPNEA ON EXERTION (ICD-786.09) Has had cardiovascular work-up negative of CAD. He has known peripheral vascular disease s/p treatment/intervention. He does have a 50 pk year smoking history and suspect DOE is related to primary lung disease - emphysema  Plan - CXR           full PFTs pre and post bronchodilators.  Orders: T-2 View CXR (71020TC) Pulmonary Referral (Pulmonary)  Complete Medication List: 1)  Aspirin Ec Low Dose 81 Mg Tbec (Aspirin) .... Once daily 2)  Cialis 20 Mg Tabs (Tadalafil) .... As needed 3)  Doxazosin Mesylate 4 Mg Tabs (Doxazosin mesylate) .... Take one tablet by mouth twice daily. 4)  Gabapentin 100 Mg Caps (Gabapentin) .Marland Kitchen.. 1 caps 1 hour before bedtime for nocturnal leg cramps 5)  Plavix 75 Mg Tabs (Clopidogrel bisulfate) .... Take one tablet by mouth daily 6)  Vitamin C 100 Mg Tabs (Ascorbic acid) 7)  Vitamin D3 1000 Unit Tabs (Cholecalciferol) .Marland Kitchen.. 1 by mouth daily 8)  Hydrocodone-acetaminophen 10-325 Mg Tabs (Hydrocodone-acetaminophen) .Marland Kitchen.. 1 every 4 hrs as needed for pain  DG CHEST 2 VIEW - [id number]   Clinical Data: Shortness of breath with exertion, hypertension, former smoker   CHEST - 2 VIEW   Comparison: Chest x-ray of 10/19/2005   Findings: The lungs are clear but slightly hyperaerated. Mediastinal contours appear normal.  The heart is within normal limits in size.  No bony abnormality is seen.   IMPRESSION: Hyperaeration consistent with COPD.  No active lung disease.   Read By:  Juline Patch,  M.D.  Orders Added: 1)  T-2 View CXR [71020TC] 2)  Pulmonary Referral [Pulmonary] 3)  Est. Patient Level IV [54098]

## 2010-05-21 ENCOUNTER — Encounter: Payer: Self-pay | Admitting: Pulmonary Disease

## 2010-05-21 ENCOUNTER — Ambulatory Visit (INDEPENDENT_AMBULATORY_CARE_PROVIDER_SITE_OTHER): Payer: Self-pay | Admitting: Pulmonary Disease

## 2010-05-21 DIAGNOSIS — J449 Chronic obstructive pulmonary disease, unspecified: Secondary | ICD-10-CM

## 2010-05-28 NOTE — Assessment & Plan Note (Signed)
Summary: 6 week rov//sh   Visit Type:  Follow-up Copy to:  pcp Primary Provider/Referring Provider:  Illene Regulus  CC:  Pt states breathing is the same. c/o SOB with exertion.  History of Present Illness: 69/M, ex smoker with DOE, quit smoking nov'10 - 25 Pyrs Had stents put in B LEs - claudication better Smokes marijuana - a blunt/ day - coughs white phlegm when he does this, No wheeze, PND, orthopnea  No recurrent colds, chest pain. His wife continues to smoke CXR  -hyperaeration PFTs - mild airway obstruction FEV1 70%,no BD response,  nml lung volumes,  preserved DLCO.  May 21, 2010 1:51 PM  spiriva did not help. Has not been contacted by pulm rehab. feels his 'vigor' has decresaed.    Preventive Screening-Counseling & Management  Alcohol-Tobacco     Smoking Status: quit < 6 months     Smoking Cessation Counseling: yes     Packs/Day: 0.5     Year Started: 1957     Year Quit: 01/2009  Caffeine-Diet-Exercise     Caffeine use/day: 2     Does Patient Exercise: no     Exercise (avg: min/session): 5:15  Current Medications (verified): 1)  Aspirin Ec Low Dose 81 Mg  Tbec (Aspirin) .... Once Daily 2)  Cialis 20 Mg  Tabs (Tadalafil) .... As Needed 3)  Doxazosin Mesylate 4 Mg  Tabs (Doxazosin Mesylate) .... Take One Tablet By Mouth Twice Daily. 4)  Gabapentin 300 Mg Caps (Gabapentin) .... Take 1 Tablet By Mouth Two Times A Day 5)  Plavix 75 Mg Tabs (Clopidogrel Bisulfate) .... Take One Tablet By Mouth Daily  Allergies (verified): 1)  ! Penicillin 2)  ! Ace Inhibitors  Past History:  Past Medical History: Last updated: 10/14/2009 PERIPHERAL VASCULAR DISEASE (ICD-443.9) s/p PTA/stent left SFA 9/10, right SFA PTA/stent 02/2009 CHEST PAIN (ICD-786.50) ANGIOEDEMA (ICD-995.1) ESSENTIAL HYPERTENSION (ICD-401.9) HYPERLIPIDEMIA (ICD-272.4) HX, PERSONAL, TOBACCO USE (ICD-V15.82) CALF CRAMPS, NOCTURNAL (ICD-729.82) DIARRHEA (ICD-787.91) IRRITABLE BOWEL SYNDROME  (ICD-564.1) BENIGN PROSTATIC HYPERTROPHY, WITH URINARY OBSTRUCTION (ICD-600.01) ANEMIA-NOS (ICD-285.9) PHARYNGITIS, ACUTE (ICD-462) IMPOTENCE (ICD-302.72)  Social History: Last updated: 10/20/2009 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  The patient denies anorexia, fever, weight loss, weight gain, vision loss, decreased hearing, hoarseness, chest pain, syncope, peripheral edema, prolonged cough, headaches, hemoptysis, abdominal pain, melena, hematochezia, severe indigestion/heartburn, hematuria, muscle weakness, transient blindness, difficulty walking, depression, unusual weight change, abnormal bleeding, enlarged lymph nodes, and angioedema.    Vital Signs:  Patient profile:   70 year old male Height:      69 inches Weight:      196 pounds BMI:     29.05 O2 Sat:      93 % on Room air Temp:     98.0 degrees F oral Pulse rate:   76 / minute BP sitting:   124 / 80  (left arm) Cuff size:   regular  Vitals Entered By: Zackery Barefoot CMA (May 21, 2010 1:27 PM)  O2 Flow:  Room air CC: Pt states breathing is the same. c/o SOB with exertion Comments Medications reviewed with patient Verified contact number and pharmacy with patient Zackery Barefoot CMA  May 21, 2010 1:27 PM    Physical Exam  Additional Exam:  Gen. Pleasant, thin, in no distress, normal affect ENT - no lesions, no post  nasal drip Neck: No JVD, no thyromegaly, no carotid bruits Lungs: no use of accessory muscles, no dullness to percussion, clear without rales or rhonchi  Cardiovascular: Rhythm regular, heart sounds  normal, no murmurs or gallops, no peripheral edema Musculoskeletal: No deformities, no cyanosis or clubbing      Impression & Recommendations:  Problem # 1:  C O P D (ICD-496) Enroll in pulm rehab Use albuterol MDI as needed  only Follow yearly for lung function assessment  Medications Added to Medication List This Visit: 1)  Gabapentin 300 Mg Caps (Gabapentin) .... Take 1 tablet by mouth two times a day  Other Orders: Est. Patient Level II (11914)  Patient Instructions: 1)  Copy sent to:dr norins 2)  Please schedule a follow-up appointment in 1 year. 3)  Pulmonary rehab program  4)  Albuterol sample to use as needed

## 2010-08-25 NOTE — Procedures (Signed)
VASCULAR LAB EXAM   INDICATION:  Preop for peripheral vascular disease.   HISTORY:  Diabetes:  Cardiac:  Yes.  Hypertension:  Yes.   EXAM:  Duplex of bilateral great saphenous vein for bypass conduit.   IMPRESSION:  Patent bilateral great saphenous veins with normal size.  See attachment for drawings.   ___________________________________________  V. Charlena Cross, MD   MG/MEDQ  D:  02/25/2009  T:  02/25/2009  Job:  [id number]

## 2010-08-25 NOTE — Assessment & Plan Note (Signed)
OFFICE VISIT   GURVEER, COLUCCI  DOB:  June 02, 1940                                       02/24/2009  ZOXWR#:[id number]   REASON FOR VISIT:  Right leg claudication.   HISTORY:  This is a 70 year old gentleman that I am seeing at the  request of Dr. Clifton James for right leg claudication.  The patient has  been having symptoms for greater than 1-1/2 years that has gotten worse.  He has recently undergone subintimal recannulization on the left on  December 18, 2008.  This resulted in resolution of the patient's  symptoms on the left.  Shortly thereafter, the patient underwent an  attempt at recannulization of the right but was unsuccessful.  The  patient comes in today for surgical discussions.  He states that he  cannot walk more than 50 feet without having to stop secondary to cramps  in his legs.  He denies having any rest pain.  He denies having any  ulcers.  His symptoms are alleviated by rest, they are aggravated by  activity.   The patient has a history of tobacco abuse but quit 2 months ago after a  trial of Chantix.  He also suffers from hypercholesterolemia which is  under control.   REVIEW OF SYSTEMS:  Positive for shortness of breath with exertion,  reflux, hiatal hernia, frequent urination, pain in the legs with  walking, muscle pain.  All other review of systems are negative as  documented in the encounter form.   PAST MEDICAL HISTORY:  Hypercholesterolemia, peripheral vascular  disease, erectile dysfunction.   FAMILY HISTORY:  Negative for cardiovascular disease at an early age.   SOCIAL HISTORY:  He is married with 4 children and works in [employer]ural [employer].  He does not smoke.  Has a history smoking but quit 2 months  ago.  He drinks alcohol once weekly.   PHYSICAL EXAMINATION:  Heart rate is 52, blood pressure 132/91,  temperature is 98.4.  General:  Well-appearing, no distress.  HEENT:  Normocephalic, atraumatic.  Pupils equal.   Sclerae anicteric.  Neck:  Supple.  No JVD.  No carotid bruits.  Lungs:  Clear bilaterally.  No  rhonchi.  No wheezing.  Cardiovascular:  Regular rate and rhythm.  No  murmur.  Abdomen:  Soft, nontender.  Musculoskeletal:  Without major  deformities.  Extremities:  Warm, well-perfused.  There is no  ulceration.  Neuro:  Nonfocal.  Skin:  Without rash.   DIAGNOSTICS:  I have reviewed the patient's arteriograms performed by  Dr. Clifton James.  He has a total occlusion of the right with three-vessel  runoff.   ASSESSMENT/PLAN:  Right leg claudication.   PLAN:  I had a long conversation with the patient and his wife.  We  discussed proceeding with surgical bypass.  The patient and wife,  however, preferred to have a repeat attempt at recannulization  percutaneously.  I told them that I would attempt at performing  subintimal recanalization, and I discussed that since Dr. Clifton James was  unable to get across this lesion that it is likely that I would not be  able to as well.  However, they wish to try this prior to proceeding  with surgical bypass.  I have scheduled his arteriogram for Tuesday,  March 04, 2009.  We discussed the risks of distal embolization and  possible  emergent surgery.  He understands this.  If we are unable to  get across this, we would go ahead and proceed with surgical bypass  within the next week.  He is going to get a vein map today.   Jorge Ny, MD  Electronically Signed   VWB/MEDQ  D:  02/24/2009  T:  02/25/2009  Job:  [id number]   cc:   Verne Carrow, MD

## 2010-08-25 NOTE — Procedures (Signed)
NAME:  Henry Carter, Henry Carter NO.:  [id number]   MEDICAL RECORD NO.:  [id number]          PATIENT TYPE:  AMB   LOCATION:  SDS                          FACILITY:  [facility]   PHYSICIAN:  Verne Carrow, MDDATE OF BIRTH:  23-Apr-1940   DATE OF PROCEDURE:  DATE OF DISCHARGE:                    PERIPHERAL VASCULAR INVASIVE PROCEDURE   PROCEDURES PERFORMED:  1. Selective right superficial RA angiogram.  2. Attempted percutaneous intervention of the total occlusion of the      mid right superficial femoral artery.   OPERATOR:  Verne Carrow, MD   INDICATIONS:  Claudication in the right lower extremity.  This patient  underwent angioplasty of the CTO of the left superficial femoral artery  on December 18, 2008.  We were able to cross the occlusion at that time  and placed a stent.  His ABI normalized in his left leg.  He was brought  back today for attempted angioplasty of the chronic total occlusion of  the right superficial femoral artery.   DETAILS OF THE PROCEDURE:  The patient was brought to the peripheral  vascular laboratory after signing informed consent for the procedure.  He was placed supine on the cath table.  The left groin was prepped and  draped in sterile fashion.  Lidocaine 1% was used for local anesthesia.  A 7-French crossover sheath was then inserted into the left femoral  artery.  A crossover catheter was used to engage the wire around the  bifurcation of the aorta down into the right common femoral artery.  A  Versicore wire was then used to lead in front of the crossover catheter  as it was advanced around the bifurcation into the right common femoral  artery.  The dilator and the wire were then removed.  A slip catheter  was then advanced down to the tip of the sheath.  A glidewire was then  advanced down through the proximal portion of the superficial femoral  artery to the area in the midportion of the artery of the total  occlusion.  The  slip catheter was then advanced down to the total  occlusion.  I initially attempted to advance the glidewire but was  unable to find the true lumen.  We changed out the slip catheter for an  angled catheter.  I once again attempted to pass the glidewire but was  unable to find the true lumen.  We then placed a 0.14 mm wire down and  made multiple attempts to find the lumen of the vessel.  We were unable  to do so.  The procedure was aborted after 75 minutes.  The patient  tolerated the procedure well and was taken to the holding area in stable  condition.   IMPRESSION:  1. 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  2. Unsuccessful percutaneous attempt at revascularization of the total      occlusion of the mid right superficial femoral artery.   RECOMMENDATIONS:  The patient will be continued on aspirin and Plavix.  He will be encouraged to continue smoking cessation.  I will discuss  further with him, but will  make a surgical referral for a possible  surgical revascularization as this patient is severely limited in his  lifestyle by the claudication in his right leg.      Verne Carrow, MD  Electronically Signed     CM/MEDQ  D:  01/29/2009  T:  01/29/2009  Job:  [id number]

## 2010-08-25 NOTE — Assessment & Plan Note (Signed)
OFFICE VISIT   Henry Carter, Henry Carter  DOB:  September 23, 1940                                       04/14/2009  MVHQI#:[id number]   REASON FOR VISIT:  Followup.   HISTORY:  This is a 70 year old gentleman who underwent subintimal  recanalization of an occluded right superficial femoral and popliteal  artery, which were subsequently stented.  This was performed 03/04/2009.  The patient's left leg had been performed previously by Dr. Clifton James.  The patient is not having any problems at this time.  He does complain  of some swelling in his left ankle from where he dropped a [place] on it  several months ago.  He is concerned that this could be due to some  bleeding since he is on a significant amount of blood thinner.  Otherwise, however, the patient is not having claudication-like  symptoms.   PHYSICAL EXAMINATION:  Blood pressure is 131/82, pulse 79, respirations  20.  General:  He is well-appearing, in no distress.  HEENT:  Normocephalic, atraumatic.  Pupils equal.  Sclerae anicteric,  conjunctivae clear bilaterally.  Cardiovascular reveals a regular  rhythm.  Abdomen is soft, nontender.  Neurologic:  He has no focal  deficit.  Skin is without rash.  Musculoskeletal shows a swollen left  ankle with normal range of motion.  He has palpable pedal pulses.   DIAGNOSTIC STUDIES:  Ankle-brachial index was performed today.  He is  0.98 on the right, 1.1 on the left.   ASSESSMENT/PLAN:  Status post bilateral subintimal recanalization.   PLAN:  The patient will need surveillance screening ultrasound to be  done every 3 months for the first year.  Since he was initially Dr.  Gibson Ramp patient, I would be satisfied if this was performed at the  [facility] office as he is seeing him for other reasons as well.  As long  as I can get a copy of his ultrasound, that would be fine.  Otherwise, I  will plan to see him back in 1 year.     Jorge Ny, MD  Electronically  Signed   VWB/MEDQ  D:  04/14/2009  T:  04/16/2009  Job:  [id number]   cc:   Verne Carrow, MD

## 2010-08-28 NOTE — Assessment & Plan Note (Signed)
[facility]                           PRIMARY CARE OFFICE NOTE   Henry Carter, Henry Carter                     MRN:          [id number]  DATE:06/02/2006                            DOB:          1940-08-28    Henry Carter is a 70 year old African-American gentleman last seen October 19, 2005, for a complete physical exam; please see that dictation.  The  patient's past medical history is also well documented in the chart.   The patient presents acutely today because of chest discomfort that has  been getting worse over the last week or so.  He describes pain  underneath his left breast sharp in nature, rated as a 6-8/10.  He  reports that there will be no radiation to his neck or arm, he has had  shortness of breath, no diaphoresis.  There is no exercise relationship.  Duration will be 5-10 minutes.  He will sometimes have, unrelated to  this, some discomfort and tingling in his right arm which may lead to  some discomfort under his left breast.  The patient has had no  limitations in his activities because of this discomfort.  It has not  awakened him from sleep.   CARDIAC RISK FACTORS:  Male gender, tobacco abuse.  The patient has  normal lipids, he has normal blood sugar.  He is of normal weight.   The patient also complains of pain in his calves with walking.  After 50-  75 yards he will have significant tightness and cramping type discomfort  in his calves which does get relieved with rest.   CURRENT MEDICATIONS:  Aspirin 81 mg a day.   EXAMINATION:  Temperature was 96.5, blood pressure 145/83, pulse was 76,  weight 166.  GENERAL APPEARANCE:  A slender African-American gentleman in no acute  distress.  CHEST:  Clear.  CARDIOVASCULAR:  Radial pulses 2+, precordium was quiet.  He had a  regular rate and rhythm. No murmurs, rubs or gallops were appreciated.  There were no carotid bruits, there was no JVD.  The patient had a trace  dorsalis pedis  pulse, trace posterior tibial pulse.  He had an easily  palpable femoral pulse on the right.  He did have a bruit over this  artery.   ASSESSMENT AND PLAN:  1. Cardiovascular.  Patient with atypical chest pain.  He does have      several cardiac risk factors but not a high risk.  His symptoms, as      noted, are atypical.  Plan:  Patient to increase his aspirin to 325      mg daily.  He is to stop immediately all smoking and all tobacco      products.  He is advised if he has progressive pain or discomfort,      he gets shortness of breath or diaphoresis or any change in his      symptoms he is to notify us immediately.  He was given a      prescription for sublingual nitroglycerin to take on an as needed      basis should he  get significant or severe chest discomfort.   Patient is scheduled for a stress only Cardiolite study on Thursday,  March 6th, at 7:30 a.m. and he is aware of this appointment.  1. Peripheral vascular disease.  Patient is presenting with symptoms      of claudication.  On examination, he does have weak peripheral      pulses and a bruit over the femoral artery.  His risk factors are      as above.  Plan:  The patient is to take 325 mg aspirin and to stop      all nicotine products.  He is scheduled for a lower extremity      arterial Doppler study for Thursday, March 6th, at 11 a.m. at      [facility].   In summary, the patient is presenting with atypical discomfort.  I think  he is at low to moderate risk.  He does understand if his symptoms  should progress he needs to notify us immediately for evaluation and/or  go to [facility].     Rosalyn Gess. Norins, MD  Electronically Signed    MEN/MedQ  DD: 06/02/2006  DT: 06/02/2006  Job #: [id number]   cc:   Nuclear Lab  Felicity Pellegrini

## 2010-08-28 NOTE — Assessment & Plan Note (Signed)
[facility]                             PRIMARY CARE OFFICE NOTE   Henry Carter, Henry Carter                     MRN:          [id number]  DATE:10/19/2005                            DOB:          26-May-1940    Henry Carter is a delightful 70 year old African American gentleman who  presents for followup evaluation and exam.  I last saw him in May of 2005;  please see that complete dictation for past medical history, family history  and social history.   INTERVAL HISTORY:  The patient has been healthy with no new medical  problems.  He has been working hard and watching his diet and this has  resulted in some weight loss.  The patient does continue to smoke.   CURRENT MEDICATIONS:  The patient is taking no prescription medications.   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  They currently have 8  residential patients.  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  Although he is working very hard, he is very proud of his  work and thinks that it is his calling.   REVIEW OF SYSTEMS:  Review of systems is negative for fevers, sweats and  chills.  He has had weight loss, which is due to hard work and planned.  It  has been more than 10 years since he has had an eye exam.  No dental or ENT  complaints.  The patient has rare chest discomfort described as dull  squeezing, the duration only being seconds.  He has had no respiratory  problems.  GI is significant for occasional heartburn.  GU is significant  for nocturia of 0-3 times, but does not disrupt his sleep and he does have  musculoskeletal cramps.   EXAMINATION:  VITAL SIGNS:  Temperature was 97.1, blood pressure 146/90,  pulse 67.  Weight 168, down 18 pounds from 2 years ago.  GENERAL APPEARANCE:  This is a slender  gentleman in no acute distress.  HEENT:  Normocephalic, atraumatic.  EACs and TMs were unremarkable.  The  patient is edentulous with dentures in place.  No buccal lesions were noted.  Posterior pharynx was clear.  Conjunctivae and sclerae were clear.  Pupils  were equal, round and reactive.  Funduscopic exam was unremarkable.  NECK:  Supple without thyromegaly.  NODES:  No adenopathy was noted in the cervical or supraclavicular regions.  CHEST:  Clear.  No rales, wheezes or rhonchi were noted.  CARDIOVASCULAR:  Radial pulse 2+.  No JVD or carotid bruits.  He has a quiet  precordium with regular rate and rhythm without murmurs, rubs, or gallops.  ABDOMEN:  Soft.  No guarding or rebound.  No organosplenomegaly was noted.  GENITALIA:  Normal uncircumcised  male, bilaterally descended testicles  without masses.  RECTAL:  Normal sphincter tone was noted.  Prostate was smooth, round,  normal in size and contour without nodules.  EXTREMITIES:  Without clubbing or cyanosis.  No edema and no deformities  were noted.  NEUROLOGIC:  Exam is nonfocal.   DATA BASE:  Hemoglobin was 11.1 g, white count was 4200 with a normal  differential.  Chemistries were unremarkable.  His blood sugar was 114.  Liver functions and kidney function were normal.  Thyroid function normal  with a TSH of 0.69.  PSA was normal at 1.39.  Cholesterol was normal at 152,  triglycerides 65.  HDL was slightly low at 36.  LDL cholesterol as excellent  at 103.   Twelve-lead EKG was performed, which revealed a normal sinus rhythm, no  signs of acute injury, no signs of ischemia.   ASSESSMENT AND PLAN:  This is a healthy-appearing gentleman with a normal  examination with normal laboratory, except for very minimal anemia and  normal EKG.  The patient does continue to smoke.  We discussed this at  length.  I provided him with the number 1-800-QUIT-NOW.  He is for chest x-  ray today.  I have adamantly encouraged him to stop  smoking.   The patient otherwise is doing well.  I have asked him to return to see me  for routine followup evaluation in 12-18 months, sooner on an as-needed  basis.                                   Rosalyn Gess Norins, MD   MEN/MedQ  DD:  10/20/2005  DT:  10/20/2005  Job #:  [id number]   cc:   [address] [place], [place] [address] Felicity Pellegrini

## 2010-09-09 ENCOUNTER — Other Ambulatory Visit: Payer: Self-pay | Admitting: Internal Medicine

## 2010-09-09 NOTE — Telephone Encounter (Signed)
Doxazosin 4mg  tablet Last refill: #60x2 on 04/10/2010 Rx Done.

## 2010-10-25 ENCOUNTER — Other Ambulatory Visit: Payer: Self-pay | Admitting: Internal Medicine

## 2010-11-20 ENCOUNTER — Other Ambulatory Visit (INDEPENDENT_AMBULATORY_CARE_PROVIDER_SITE_OTHER): Payer: Medicare Other

## 2010-11-20 ENCOUNTER — Encounter: Payer: Self-pay | Admitting: Internal Medicine

## 2010-11-20 ENCOUNTER — Other Ambulatory Visit: Payer: Self-pay | Admitting: Internal Medicine

## 2010-11-20 ENCOUNTER — Ambulatory Visit (INDEPENDENT_AMBULATORY_CARE_PROVIDER_SITE_OTHER): Payer: Medicare Other | Admitting: Internal Medicine

## 2010-11-20 DIAGNOSIS — I739 Peripheral vascular disease, unspecified: Secondary | ICD-10-CM

## 2010-11-20 DIAGNOSIS — R0609 Other forms of dyspnea: Secondary | ICD-10-CM

## 2010-11-20 DIAGNOSIS — I10 Essential (primary) hypertension: Secondary | ICD-10-CM

## 2010-11-20 DIAGNOSIS — E785 Hyperlipidemia, unspecified: Secondary | ICD-10-CM

## 2010-11-20 DIAGNOSIS — Z125 Encounter for screening for malignant neoplasm of prostate: Secondary | ICD-10-CM

## 2010-11-20 DIAGNOSIS — R5381 Other malaise: Secondary | ICD-10-CM

## 2010-11-20 DIAGNOSIS — F528 Other sexual dysfunction not due to a substance or known physiological condition: Secondary | ICD-10-CM

## 2010-11-20 DIAGNOSIS — D649 Anemia, unspecified: Secondary | ICD-10-CM

## 2010-11-20 DIAGNOSIS — N529 Male erectile dysfunction, unspecified: Secondary | ICD-10-CM

## 2010-11-20 DIAGNOSIS — R5383 Other fatigue: Secondary | ICD-10-CM

## 2010-11-20 LAB — HEPATIC FUNCTION PANEL
Albumin: 4.1 g/dL (ref 3.5–5.2)
Alkaline Phosphatase: 145 U/L — ABNORMAL HIGH (ref 39–117)

## 2010-11-20 LAB — CBC WITH DIFFERENTIAL/PLATELET
Basophils Absolute: 0 10*3/uL (ref 0.0–0.1)
Eosinophils Absolute: 0.1 10*3/uL (ref 0.0–0.7)
MCHC: 31.9 g/dL (ref 30.0–36.0)
MCV: 83.8 fl (ref 78.0–100.0)
Monocytes Absolute: 0.4 10*3/uL (ref 0.1–1.0)
Neutrophils Relative %: 57.2 % (ref 43.0–77.0)
Platelets: 267 10*3/uL (ref 150.0–400.0)
RDW: 20.6 % — ABNORMAL HIGH (ref 11.5–14.6)

## 2010-11-20 LAB — COMPREHENSIVE METABOLIC PANEL
BUN: 11 mg/dL (ref 6–23)
CO2: 27 mEq/L (ref 19–32)
Creatinine, Ser: 1.3 mg/dL (ref 0.4–1.5)
GFR: 67.89 mL/min (ref >60.00)
Glucose, Bld: 102 mg/dL — ABNORMAL HIGH (ref 70–99)
Sodium: 139 mEq/L (ref 135–145)
Total Bilirubin: 0.6 mg/dL (ref 0.3–1.2)
Total Protein: 7.4 g/dL (ref 6.0–8.3)

## 2010-11-20 LAB — LIPID PANEL
Cholesterol: 214 mg/dL — ABNORMAL HIGH (ref 0–200)
Triglycerides: 117 mg/dL (ref 0.0–149.0)

## 2010-11-20 LAB — TSH: TSH: 1.11 u[IU]/mL (ref 0.35–5.50)

## 2010-11-20 NOTE — Assessment & Plan Note (Signed)
For follow up lab today with recommendations to follow.  

## 2010-11-20 NOTE — Assessment & Plan Note (Signed)
Possible source of weakness and fatigue  Plan - CBC

## 2010-11-20 NOTE — Assessment & Plan Note (Signed)
Patient with generalized fatigue. No overt cardiac or respiratory symptoms to explain his complaint  Plan - r/o metabolic disease, anemia, LV dysfunction: CBC, Bmet, TSH, 2D echo

## 2010-11-20 NOTE — Assessment & Plan Note (Signed)
Good peripheral pulses, No carotid bruits

## 2010-11-20 NOTE — Assessment & Plan Note (Signed)
B/P controlled 

## 2010-11-20 NOTE — Progress Notes (Signed)
  Subjective:    Patient ID: Henry Carter, male    DOB: November 12, 1940, 70 y.o.   MRN: [id number]  HPI Mr. Dematteo presents with a 4 week h/o decreased energy and exercise tolerance. He does not recall any precipitating symptom or event. He denies c/p, fever, sweats, chills, weight change, change in bowel habit, N/V, no hematemesis, cough, increased SOB/DOE. He reports that with minimal exertion he feels exhausted. He also reports that he is unable to attain an erection.  Past Medical History  Diagnosis Date  . Peripheral vascular disease, unspecified   . Chest pain   . Angioedema   . Essential hypertension   . Hyperlipidemia   . Personal history of tobacco use, presenting hazards to health   . Cramp of limb   . Diarrhea   . IBS (irritable bowel syndrome)   . BPH (benign prostatic hyperplasia)   . Anemia   . Acute pharyngitis   . Impotence    Past Surgical History  Procedure Date  . Abdominal surg for ulcers'   . Distal aortogram   . Percutaneous transluminal angionplasty with placement of 2 self expanding stent in the distal left superfical femeral artery    No family history on file. History   Social History  . Marital Status: Married    Spouse Name: N/A    Number of Children: N/A  . Years of Education: N/A   Occupational History  . Not on file.   Social History Main Topics  . Smoking status: Not on file  . Smokeless tobacco: Not on file  . Alcohol Use: Not on file  . Drug Use: Not on file  . Sexually Active: Not on file   Other Topics Concern  . 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  HEENT:  Negative for hearing loss, ear pain, congestion, neck stiffness and postnasal drip. Negative for sore throat or swallowing problems.  Negative for dental complaints.   Eyes: Negative for vision loss or change in visual acuity.  Respiratory: Negative for chest tightness and wheezing.   Cardiovascular: Negative for chest pain and palpitation. No decreased exercise tolerance Gastrointestinal: No change in bowel habit. No bloating or gas. No reflux or indigestion Genitourinary: Negative for urgency, frequency, flank pain and difficulty urinating.  Musculoskeletal: Negative for myalgias, back pain, arthralgias and gait problem.  Neurological: Negative for dizziness, tremors, weakness and headaches.  Hematological: Negative for adenopathy.  Psychiatric/Behavioral: Negative for behavioral problems and dysphoric mood.       Objective:   Physical Exam Vitals normal Gen'l- WNWD AA male in no distress HEENT - C&S clear, PERRLA Neck - supple, no thyromegaly Nodes - negative submandibular, cervical, supraclavicular, axillary or inguinal regions Chest - no CVAT, respirations are normal Cor - 2+ radial pulses, RRR w/o m/r/g, no JVD, no carotid bruit Abdomen- BS+, no guarding or rebound Neuro - A&O x 3, CN II-XII normal       Assessment & Plan:

## 2010-11-20 NOTE — Assessment & Plan Note (Signed)
Unable to attain an erection.  Plan - testosterone level.

## 2010-11-23 ENCOUNTER — Telehealth: Payer: Self-pay | Admitting: Internal Medicine

## 2010-11-23 ENCOUNTER — Encounter: Payer: Self-pay | Admitting: Internal Medicine

## 2010-11-23 LAB — TESTOSTERONE, FREE, TOTAL, SHBG
Testosterone, Free: 90.7 pg/mL (ref 47.0–244.0)
Testosterone-% Free: 1.9 % (ref 1.6–2.9)

## 2010-11-23 NOTE — Telephone Encounter (Signed)
Please call - testosterone levels are normal.

## 2010-11-23 NOTE — Telephone Encounter (Signed)
Patient notified per MD.

## 2010-11-26 ENCOUNTER — Other Ambulatory Visit: Payer: Self-pay | Admitting: *Deleted

## 2010-11-26 MED ORDER — CLOPIDOGREL BISULFATE 75 MG PO TABS: 75.0000 mg | ORAL_TABLET | Freq: Every day | ORAL | Status: DC

## 2010-12-02 ENCOUNTER — Other Ambulatory Visit (HOSPITAL_COMMUNITY): Payer: Medicare Other

## 2010-12-09 ENCOUNTER — Ambulatory Visit (HOSPITAL_COMMUNITY): Payer: Medicare Other | Attending: Internal Medicine | Admitting: Radiology

## 2010-12-09 DIAGNOSIS — Z87891 Personal history of nicotine dependence: Secondary | ICD-10-CM | POA: Insufficient documentation

## 2010-12-09 DIAGNOSIS — J4489 Other specified chronic obstructive pulmonary disease: Secondary | ICD-10-CM | POA: Insufficient documentation

## 2010-12-09 DIAGNOSIS — R5383 Other fatigue: Secondary | ICD-10-CM | POA: Insufficient documentation

## 2010-12-09 DIAGNOSIS — I10 Essential (primary) hypertension: Secondary | ICD-10-CM | POA: Insufficient documentation

## 2010-12-09 DIAGNOSIS — J449 Chronic obstructive pulmonary disease, unspecified: Secondary | ICD-10-CM | POA: Insufficient documentation

## 2010-12-09 DIAGNOSIS — R5381 Other malaise: Secondary | ICD-10-CM | POA: Insufficient documentation

## 2010-12-09 DIAGNOSIS — I369 Nonrheumatic tricuspid valve disorder, unspecified: Secondary | ICD-10-CM

## 2010-12-09 DIAGNOSIS — E785 Hyperlipidemia, unspecified: Secondary | ICD-10-CM | POA: Insufficient documentation

## 2010-12-22 ENCOUNTER — Other Ambulatory Visit: Payer: Self-pay | Admitting: Internal Medicine

## 2011-05-03 ENCOUNTER — Other Ambulatory Visit: Payer: Self-pay | Admitting: Internal Medicine

## 2011-05-05 ENCOUNTER — Other Ambulatory Visit: Payer: Self-pay | Admitting: Internal Medicine

## 2011-05-05 ENCOUNTER — Ambulatory Visit (INDEPENDENT_AMBULATORY_CARE_PROVIDER_SITE_OTHER): Payer: Medicare HMO | Admitting: Internal Medicine

## 2011-05-05 VITALS — BP 124/86 | HR 71 | Temp 98.2°F | Wt 189.0 lb

## 2011-05-05 DIAGNOSIS — M7551 Bursitis of right shoulder: Secondary | ICD-10-CM

## 2011-05-05 DIAGNOSIS — M719 Bursopathy, unspecified: Secondary | ICD-10-CM

## 2011-05-05 MED ORDER — METHYLPREDNISOLONE ACETATE 80 MG/ML IJ SUSP: 120.0000 mg | Freq: Once | INTRAMUSCULAR | Status: DC

## 2011-05-05 MED ORDER — ESOMEPRAZOLE MAGNESIUM 40 MG PO CPDR: 40.0000 mg | DELAYED_RELEASE_CAPSULE | Freq: Every day | ORAL | Status: DC

## 2011-05-05 NOTE — Progress Notes (Signed)
  Subjective:    Patient ID: Henry Carter, male    DOB: 01/16/1941, 71 y.o.   MRN: [id number]  HPI Henry Carter presents with a 2 weeks h/o pain in the right shoulder. He denies any precipitating event. He does have restricted ROM. He is on anti-coagulation and has not taken any NSAIDs.  I have reviewed the patient's medical history in detail and updated the computerized patient record.    Review of Systems System review is negative for any constitutional, cardiac, pulmonary, GI or neuro symptoms or complaints other than as described in the HPI.     Objective:   Physical Exam Filed Vitals:   05/05/11 1044  BP: 124/86  Pulse: 71  Temp: 98.2 F (36.8 C)   Gen'l- WNWD AA man in no distress Pulm - normal respirations Cor - RRR MSK- passive ROM is 80% of normal with pain limiting adductions and flexion. No crepitus, no tenderness over the biceps tendon insertion.  Procedure Joint/bursal injection  Indication - localized pain - right shoulder Consent - informed verbal consent from patient after explanation of risks of bleeding and infection Prep - injection site identified, prepped with betadine followed by alcohol. Med -   40 Mg depomedrol with  0.5Cc 2% xylocain w/ epi Injection - bursa/joint space entered easily. Injected without difficulty. Patient tolerated this well. Post-procedure - patient with rapid reduction in discomfort. Bandaid applied. Routine precautions provided including instruction to return for fever, drainage or increased pain        Assessment & Plan:  Bursitis right shoulder Plan - better after depo injection           Decreased activity until symptoms resolved.

## 2011-07-12 ENCOUNTER — Other Ambulatory Visit: Payer: Self-pay | Admitting: Internal Medicine

## 2011-08-24 ENCOUNTER — Other Ambulatory Visit: Payer: Self-pay

## 2011-08-24 MED ORDER — GABAPENTIN 300 MG PO CAPS: ORAL_CAPSULE | ORAL | Status: DC

## 2011-09-13 ENCOUNTER — Encounter: Payer: Self-pay | Admitting: Internal Medicine

## 2011-09-13 ENCOUNTER — Ambulatory Visit (INDEPENDENT_AMBULATORY_CARE_PROVIDER_SITE_OTHER): Payer: Medicare HMO | Admitting: Internal Medicine

## 2011-09-13 VITALS — BP 140/88 | HR 72 | Temp 98.7°F | Resp 16 | Wt 178.0 lb

## 2011-09-13 DIAGNOSIS — I872 Venous insufficiency (chronic) (peripheral): Secondary | ICD-10-CM

## 2011-09-13 DIAGNOSIS — G609 Hereditary and idiopathic neuropathy, unspecified: Secondary | ICD-10-CM

## 2011-09-13 NOTE — Patient Instructions (Signed)
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 Treatment - you must try compression stockings for at least 6 months. If you continue to have problems we can refer you to a vein specialist for surgical (laser, etc) treatment.  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 Plan - increase gabapentin to 300 mg AM and 600 mg at bedtime. If this doesn't provide adequate relief may go to 600 mg twice a day.   Discharge Instructions Browse by Alphabet A B C D E F G H I J K L M N O P Q R S T U V W X Y Z  Browse by Category All Documents Allergy and Immunology Anesthesiology [facility] Bioterrorism Cardiology Critical Care Dentistry Dermatology Diabetes Dietary Easy-to-Read Emergency Medicine Endocrinology ENT Family Medicine Forms Gastroenterology Geriatrics Hematology Home Health Care Infectious Disease Internal Medicine Labs and Tests Neonatology Nephrology Neurology Obstetrics and Gynecology Oncology Ophthalmology Orthopedics Pediatrics Pharmacology Physical Medicine and Rehabilitation Podiatry Preventive Medicine Procedures Psychiatry Pulmonary Medicine Radiology Rheumatology Surgery Urology Drug Information Sheets All Drug Information Sheets  Browse by Alphabet A B C D E F G H I J K L M N O P Q R S T U V W X Y Z Varicose Veins Varicose veins are veins that have become enlarged and twisted. CAUSES This condition is the result of valves in the veins not working properly. Valves in the veins help return blood from the leg to the heart. If these valves are damaged, blood flows backwards and backs up into the veins in the leg near the skin. This causes the veins to become larger. People who are on their feet a lot, who are pregnant, or who are overweight are more likely to develop varicose veins. SYMPTOMS    Bulging, twisted-appearing, bluish veins, most commonly found on the legs.   Leg pain or a feeling of heaviness.  These symptoms may be worse at the end of the day.   Leg swelling.   Skin color changes.  DIAGNOSIS   Varicose veins can usually be diagnosed with an exam of your legs by your caregiver. He or she may recommend an ultrasound of your leg veins. TREATMENT   Most varicose veins can be treated at home. However, other treatments are available for people who have persistent symptoms or who want to treat the cosmetic appearance of the varicose veins. These include:  Laser treatment of very small varicose veins.   Medicine that is shot (injected) into the vein. This medicine hardens the walls of the vein and closes off the vein. This treatment is called sclerotherapy. Afterwards, you may need to wear clothing or bandages that apply pressure.   Surgery.  HOME CARE INSTRUCTIONS    Do not stand or sit in one position for long periods of time. Do not sit with your legs crossed. Rest with your legs raised during the day.   Wear elastic stockings or support hose. Do not wear other tight, encircling garments around the legs, pelvis, or waist.   Walk as much as possible to increase blood flow.   Raise the foot of your bed at night with 2-inch blocks.   If you get a cut in the skin over the vein and the vein bleeds, lie down with your leg raised and press on it with a clean cloth until the bleeding stops. Then place a bandage (dressing) on the cut. See your caregiver if it continues to bleed or needs  stitches.  SEEK MEDICAL CARE IF:    The skin around your ankle starts to break down.   You have pain, redness, tenderness, or hard swelling developing in your leg over a vein.   You are uncomfortable due to leg pain.  Document Released: 01/06/2005 Document Revised: 03/18/2011 Document Reviewed: 05/25/2010 [facility] Patient Information 2012 [place], [place].

## 2011-09-13 NOTE — Assessment & Plan Note (Signed)
Increasing pain.  Plan Increase gabapentin to 300 mg AM and 600 mg PM  For inadequate relief may increase to 600 mg BID

## 2011-09-13 NOTE — Assessment & Plan Note (Signed)
Patient with varicose veins worse on the left.  Plan -  knee high support hose - OTC  For persistent symptoms after six months of conservative therapy will refer to vein center.

## 2011-09-13 NOTE — Progress Notes (Signed)
  Subjective:    Patient ID: Henry Carter, male    DOB: [birth year], 71 y.o.   MRN: [id number]  HPI Mr. Wickens presents due to increased problems with varicose veins left distal LE. He reports that with work and standing the veins enlarge and become painful. He has not had phlebitis: redness, heat and pain. He has not had vein rupture.  He reports that his peripheral neuropathy, described as burning pain in the distal legs, worse at night, is getting worse. He is currently taking gabapentin 300 mg bid.  Past Medical History  Diagnosis Date  . Peripheral vascular disease, unspecified   . Chest pain   . Angioedema   . Essential hypertension   . Hyperlipidemia   . Personal history of tobacco use, presenting hazards to health   . Cramp of limb   . Diarrhea   . IBS (irritable bowel syndrome)   . BPH (benign prostatic hyperplasia)   . Anemia   . Acute pharyngitis   . Impotence    Past Surgical History  Procedure Date  . Abdominal surg for ulcers'   . Distal aortogram   . Percutaneous transluminal angionplasty with placement of 2 self expanding stent in the distal left superfical femeral artery    No family history on file. History   Social History  . Marital Status: Married    Spouse Name: N/A    Number of Children: N/A  . Years of Education: N/A   Occupational History  . Not on file.   Social History Main Topics  . Smoking status: Former Games developer  . Smokeless tobacco: Not on file  . Alcohol Use: Not on file  . Drug Use: Not on file  . Sexually Active: Not on file   Other Topics Concern  . 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     Objective:   Physical Exam Filed Vitals:   09/13/11 1152  BP: 140/88  Pulse: 72  Temp: 98.7 F (37.1 C)  Resp: 16   Gen'l- slender AA man in no distress Cor - RRR Pulm - normal respirations Ext - easily visible varicose veins distal LE L>R, not hot to touch or tender.       Assessment & Plan:

## 2011-11-29 ENCOUNTER — Other Ambulatory Visit: Payer: Self-pay | Admitting: Internal Medicine

## 2011-12-07 ENCOUNTER — Other Ambulatory Visit (INDEPENDENT_AMBULATORY_CARE_PROVIDER_SITE_OTHER): Payer: Medicare HMO

## 2011-12-07 ENCOUNTER — Encounter: Payer: Self-pay | Admitting: Internal Medicine

## 2011-12-07 ENCOUNTER — Ambulatory Visit (INDEPENDENT_AMBULATORY_CARE_PROVIDER_SITE_OTHER): Payer: Medicare HMO | Admitting: Internal Medicine

## 2011-12-07 VITALS — BP 140/86 | HR 82 | Temp 98.7°F | Resp 16 | Wt 173.0 lb

## 2011-12-07 DIAGNOSIS — R5383 Other fatigue: Secondary | ICD-10-CM

## 2011-12-07 DIAGNOSIS — E785 Hyperlipidemia, unspecified: Secondary | ICD-10-CM

## 2011-12-07 DIAGNOSIS — D649 Anemia, unspecified: Secondary | ICD-10-CM

## 2011-12-07 DIAGNOSIS — R5381 Other malaise: Secondary | ICD-10-CM

## 2011-12-07 DIAGNOSIS — R0989 Other specified symptoms and signs involving the circulatory and respiratory systems: Secondary | ICD-10-CM

## 2011-12-07 DIAGNOSIS — R0609 Other forms of dyspnea: Secondary | ICD-10-CM

## 2011-12-07 DIAGNOSIS — I10 Essential (primary) hypertension: Secondary | ICD-10-CM

## 2011-12-07 DIAGNOSIS — Z79899 Other long term (current) drug therapy: Secondary | ICD-10-CM

## 2011-12-07 LAB — COMPREHENSIVE METABOLIC PANEL
ALT: 13 U/L (ref 0–53)
CO2: 27 mEq/L (ref 19–32)
Calcium: 9.8 mg/dL (ref 8.4–10.5)
Chloride: 104 mEq/L (ref 96–112)
GFR: 76.15 mL/min (ref >60.00)
Sodium: 137 mEq/L (ref 135–145)
Total Protein: 6.6 g/dL (ref 6.0–8.3)

## 2011-12-07 LAB — LIPID PANEL
Total CHOL/HDL Ratio: 4
Triglycerides: 106 mg/dL (ref 0.0–149.0)

## 2011-12-07 LAB — SEDIMENTATION RATE: Sed Rate: 14 mm/hr (ref 0–22)

## 2011-12-07 LAB — CBC WITH DIFFERENTIAL/PLATELET
Basophils Relative: 0.4 % (ref 0.0–3.0)
Eosinophils Absolute: 0.1 10*3/uL (ref 0.0–0.7)
MCHC: 32.3 g/dL (ref 30.0–36.0)
MCV: 93.5 fl (ref 78.0–100.0)
Monocytes Absolute: 0.4 10*3/uL (ref 0.1–1.0)
Neutro Abs: 3.7 10*3/uL (ref 1.4–7.7)
Neutrophils Relative %: 69.5 % (ref 43.0–77.0)
RBC: 4.4 Mil/uL (ref 4.22–5.81)

## 2011-12-07 LAB — HEPATIC FUNCTION PANEL: Albumin: 3.9 g/dL (ref 3.5–5.2)

## 2011-12-07 LAB — TSH: TSH: 0.92 u[IU]/mL (ref 0.35–5.50)

## 2011-12-07 NOTE — Patient Instructions (Addendum)
Generalized weakness with no focal findings on exam. Concern for cardiac function - last Echo in August '12 with EF 50-55% in patient with known atherosclerotic vascular disease. Concern for metabolic derangement: sugar, anemia, kidney function or thyroid abnormality  Plan 2D echo at [facility] - you will be called with appointment  Labs - will check sugar, kidney, thyroid, blood counts  Continue all your medications.  Further evaluation based on the results of the above.

## 2011-12-07 NOTE — Progress Notes (Signed)
Subjective:    Patient ID: Henry Carter, male    DOB: Nov 13, 1940, 71 y.o.   MRN: [id number]  HPI Henry Carter presents for generalized weakness for the past month. He is limited in his activities due to loss of energy. He has no pain, no focal findings. He has not had any fever or chills but he does feel cold. He has not had a good appetite - lost 6 lbs since last visit. Bowels are normal.  No burning with urination. No cough but chronic SOB.  Past Medical History  Diagnosis Date  . Peripheral vascular disease, unspecified   . Chest pain   . Angioedema   . Essential hypertension   . Hyperlipidemia   . Personal history of tobacco use, presenting hazards to health   . Cramp of limb   . Diarrhea   . IBS (irritable bowel syndrome)   . BPH (benign prostatic hyperplasia)   . Anemia   . Acute pharyngitis   . Impotence    Past Surgical History  Procedure Date  . Abdominal surg for ulcers'   . Distal aortogram   . Percutaneous transluminal angionplasty with placement of 2 self expanding stent in the distal left superfical femeral artery    No family history on file. History   Social History  . Marital Status: Married    Spouse Name: N/A    Number of Children: N/A  . Years of Education: N/A   Occupational History  . Not on file.   Social History Main Topics  . Smoking status: Former Games developer  . Smokeless tobacco: Not on file  . Alcohol Use: Not on file  . Drug Use: Not on file  . Sexually Active: Not on file   Other Topics Concern  . 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 aspirin 81 MG tablet Take 81 mg by mouth daily.        . clopidogrel (PLAVIX) 75 MG tablet Take 1 tablet (75 mg total) by mouth daily.  90 tablet  3  . doxazosin (CARDURA) 4 MG tablet TAKE 1  TABLET BY MOUTH TWICE DAILY  180 tablet  3  . esomeprazole (NEXIUM) 40 MG capsule Take 1 capsule (40 mg total) by mouth daily.  30 capsule  11  . gabapentin (NEURONTIN) 300 MG capsule TAKE ONE CAPSULE BY MOUTH 3 TIMES A DAY  90 capsule  3  . tadalafil (CIALIS) 20 MG tablet Take 20 mg by mouth daily as needed.         Current Facility-Administered Medications on File Prior to Visit  Medication Dose Route Frequency Provider Last Rate Last Dose  . methylPREDNISolone acetate (DEPO-MEDROL) injection 120 mg  120 mg Intramuscular Once Jacques Navy, MD          Review of Systems System review is negative for any constitutional, cardiac, pulmonary, GI or neuro symptoms or complaints other than as described in the HPI.    Objective:   Physical Exam Filed Vitals:   12/07/11 1131  BP: 140/86  Pulse: 82  Temp: 98.7 F (37.1 C)  Resp: 16   Gen'l- WNWD AA man who looks younger than his stated age HEENT- C&S clear Neck supple, no thyromegaly Cor- 2+ radial pulse, RRR Pulm - normal respirations, CTAP Neuro - non-focal exam.  Lab Results  Component Value Date   WBC 5.3 12/07/2011   HGB 13.3 12/07/2011   HCT 41.1 12/07/2011   PLT 223.0 12/07/2011   GLUCOSE 72 12/07/2011   CHOL 187 12/07/2011   TRIG 106.0 12/07/2011   HDL 48.70 12/07/2011   LDLDIRECT 145.2 11/20/2010   LDLCALC 117* 12/07/2011        ALT 13 12/07/2011   AST 23 12/07/2011        NA 137 12/07/2011   K 5.2* 12/07/2011   CL 104 12/07/2011   CREATININE 1.2 12/07/2011   BUN 12 12/07/2011   CO2 27 12/07/2011   TSH 0.92 12/07/2011   PSA 2.58 07/18/2009   INR 0.9 ratio 01/27/2009   HGBA1C 6.1 12/07/2011        Assessment & Plan:

## 2011-12-08 ENCOUNTER — Encounter: Payer: Self-pay | Admitting: Internal Medicine

## 2011-12-08 NOTE — Assessment & Plan Note (Signed)
New on-set malaise/fatigue and weakness. He has multiple medical problem. Chart reviewed - last 2 D echo '12 - EF 50-55%, no wall motion abnormality, grade I diastolic dysfunction. Lab '12 were normal. Exam is non-focal and unrevealing.  Plan R/o cardiomyopathy - 2D echo  R/o metabolic origin: CBC, TSH, Cmet, A1C  Recommendations to follow. In the interim he will continue all present medications.  Addendum: all labs returned as in normal range.

## 2011-12-14 ENCOUNTER — Other Ambulatory Visit (HOSPITAL_COMMUNITY): Payer: Medicare HMO

## 2011-12-15 ENCOUNTER — Ambulatory Visit (HOSPITAL_COMMUNITY): Payer: Medicare HMO | Attending: Cardiovascular Disease | Admitting: Radiology

## 2011-12-15 DIAGNOSIS — I079 Rheumatic tricuspid valve disease, unspecified: Secondary | ICD-10-CM | POA: Insufficient documentation

## 2011-12-15 DIAGNOSIS — I517 Cardiomegaly: Secondary | ICD-10-CM

## 2011-12-15 DIAGNOSIS — J449 Chronic obstructive pulmonary disease, unspecified: Secondary | ICD-10-CM | POA: Insufficient documentation

## 2011-12-15 DIAGNOSIS — R5381 Other malaise: Secondary | ICD-10-CM | POA: Insufficient documentation

## 2011-12-15 DIAGNOSIS — F17200 Nicotine dependence, unspecified, uncomplicated: Secondary | ICD-10-CM | POA: Insufficient documentation

## 2011-12-15 DIAGNOSIS — J4489 Other specified chronic obstructive pulmonary disease: Secondary | ICD-10-CM | POA: Insufficient documentation

## 2011-12-15 DIAGNOSIS — I739 Peripheral vascular disease, unspecified: Secondary | ICD-10-CM | POA: Insufficient documentation

## 2011-12-15 DIAGNOSIS — I10 Essential (primary) hypertension: Secondary | ICD-10-CM | POA: Insufficient documentation

## 2011-12-15 DIAGNOSIS — R5383 Other fatigue: Secondary | ICD-10-CM

## 2011-12-15 NOTE — Progress Notes (Signed)
Echocardiogram performed.  

## 2011-12-19 ENCOUNTER — Encounter: Payer: Self-pay | Admitting: Internal Medicine

## 2011-12-22 ENCOUNTER — Other Ambulatory Visit: Payer: Self-pay

## 2011-12-22 MED ORDER — CLOPIDOGREL BISULFATE 75 MG PO TABS: 75.0000 mg | ORAL_TABLET | Freq: Every day | ORAL | Status: DC

## 2012-01-05 ENCOUNTER — Ambulatory Visit (INDEPENDENT_AMBULATORY_CARE_PROVIDER_SITE_OTHER): Payer: Medicare HMO | Admitting: Internal Medicine

## 2012-01-05 ENCOUNTER — Encounter: Payer: Self-pay | Admitting: Internal Medicine

## 2012-01-05 ENCOUNTER — Other Ambulatory Visit (INDEPENDENT_AMBULATORY_CARE_PROVIDER_SITE_OTHER): Payer: Medicare HMO

## 2012-01-05 VITALS — BP 142/90 | HR 76 | Temp 97.7°F | Resp 16 | Wt 176.0 lb

## 2012-01-05 DIAGNOSIS — N401 Enlarged prostate with lower urinary tract symptoms: Secondary | ICD-10-CM

## 2012-01-05 DIAGNOSIS — R5381 Other malaise: Secondary | ICD-10-CM

## 2012-01-05 DIAGNOSIS — I10 Essential (primary) hypertension: Secondary | ICD-10-CM

## 2012-01-05 DIAGNOSIS — Z125 Encounter for screening for malignant neoplasm of prostate: Secondary | ICD-10-CM

## 2012-01-05 DIAGNOSIS — R5383 Other fatigue: Secondary | ICD-10-CM

## 2012-01-05 DIAGNOSIS — Z23 Encounter for immunization: Secondary | ICD-10-CM

## 2012-01-05 LAB — COMPREHENSIVE METABOLIC PANEL
ALT: 13 U/L (ref 0–53)
Alkaline Phosphatase: 136 U/L — ABNORMAL HIGH (ref 39–117)
CO2: 29 mEq/L (ref 19–32)
Creatinine, Ser: 1.4 mg/dL (ref 0.4–1.5)
GFR: 67.09 mL/min (ref >60.00)
Glucose, Bld: 83 mg/dL (ref 70–99)
Sodium: 141 mEq/L (ref 135–145)
Total Bilirubin: 0.5 mg/dL (ref 0.3–1.2)
Total Protein: 6.5 g/dL (ref 6.0–8.3)

## 2012-01-05 LAB — PSA: PSA: 4.04 ng/mL — ABNORMAL HIGH (ref 0.10–4.00)

## 2012-01-05 NOTE — Assessment & Plan Note (Signed)
Continued fatigue and malaise: work up - lab August 27, '13 - normal chemistry, normal CBC, TSH, A1C, LFTs except Alk Phos 138. 2 D echo normal - EF 60-65%.  Weight loss = 20 lbs since Feb '12. He is a smoker and does have a cough w/o hemoptysis.  Plan - CT with contrast  PSA

## 2012-01-05 NOTE — Progress Notes (Signed)
Subjective:    Patient ID: Henry Carter, male    DOB: September 12, 1940, 71 y.o.   MRN: [id number]  HPI Mr. Klees presents for increased problem with urinary function with incontinence secondary to urgency. He reports that the doxazosin isn't working as well which he attributes in part to a change in pill size and color. Called the pharmacy - new generic product.   Mr. Broner continues to have significant fatigue and weakness. His recent work-up included normal chemistries except for alk phos, normal CBC, Bmet, A1C, TSH. He had a 2D echo 12/15/11 - EF 60-65% with no wall motion abnormality. Reviewed vitals - he has lost 20 lbs since Feb '12. He does report having increased DOE, an increased cough but no hemoptysis.  Past Medical History  Diagnosis Date  . Peripheral vascular disease, unspecified   . Chest pain   . Angioedema   . Essential hypertension   . Hyperlipidemia   . Personal history of tobacco use, presenting hazards to health   . Cramp of limb   . Diarrhea   . IBS (irritable bowel syndrome)   . BPH (benign prostatic hyperplasia)   . Anemia   . Acute pharyngitis   . Impotence    Past Surgical History  Procedure Date  . Abdominal surg for ulcers'   . Distal aortogram   . Percutaneous transluminal angionplasty with placement of 2 self expanding stent in the distal left superfical femeral artery    No family history on file. History   Social History  . Marital Status: Married    Spouse Name: N/A    Number of Children: N/A  . Years of Education: N/A   Occupational History  . Not on file.   Social History Main Topics  . Smoking status: Former Games developer  . Smokeless tobacco: Not on file  . Alcohol Use: Not on file  . Drug Use: Not on file  . Sexually Active: Not on file   Other Topics Concern  . 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 aspirin 81 MG tablet Take 81 mg by mouth daily.        . clopidogrel (PLAVIX) 75 MG tablet Take 1 tablet (75 mg total) by mouth daily.  90 tablet  3  . doxazosin (CARDURA) 4 MG tablet TAKE 1 TABLET BY MOUTH TWICE DAILY  180 tablet  3  . esomeprazole (NEXIUM) 40 MG capsule Take 1 capsule (40 mg total) by mouth daily.  30 capsule  11  . gabapentin (NEURONTIN) 300 MG capsule TAKE ONE CAPSULE BY MOUTH 3 TIMES A DAY  90 capsule  3  . tadalafil (CIALIS) 20 MG tablet Take 20 mg by mouth daily as needed.         Current Facility-Administered Medications on File Prior to Visit  Medication Dose Route Frequency Provider Last Rate Last Dose  . methylPREDNISolone acetate (DEPO-MEDROL) injection 120 mg  120 mg Intramuscular Once Jacques Navy, MD          Review of Systems System review is negative for any constitutional, cardiac, pulmonary, GI or neuro symptoms or complaints other than as described in the HPI.     Objective:   Physical Exam Filed Vitals:   01/05/12 1553  BP: 142/90  Pulse: 76  Temp: 97.7 F (36.5  C)  Resp: 16   Wt Readings from Last 3 Encounters:  01/05/12 176 lb (79.833 kg)  12/07/11 173 lb (78.472 kg)  09/13/11 178 lb (80.74 kg)   Gen'l- gaunt appearing AA man in no distress HEENT- C&S clear Cor - RRR Pulm - normal respirations, no rales or wheezes, no increased WOB Nodes - negative in the cervical or supraclavicular region.  Rectal - NST, generous prostate w/o nodules       Assessment & Plan:

## 2012-01-05 NOTE — Assessment & Plan Note (Signed)
Doxazosin, generic cardura, isn't working as well as it did.   Plan - change to tamsulosin 0.4 mg once a day.  If tamsulosin doesn't work will need to consider a urology consult.

## 2012-01-05 NOTE — Patient Instructions (Addendum)
Enlargement of the prostate - the doxazosin has stopped working as well.  Plan - will try tamsulosin (flomax) 0.4 mg once a day.  If this doesn't work will need to consider a urology consult.  Weakness - 20 lb weight loss since Feb '12. All labs done last month were normal. 2 D echo revealed normal heart function. Plan Will check for prostate cancer - lab work PSA. Normal prostate exam today  CT scan of the chest with IV contrast to look for any lung problem that could be making you weak and loose weight. You will be called with appt.

## 2012-01-07 ENCOUNTER — Ambulatory Visit (INDEPENDENT_AMBULATORY_CARE_PROVIDER_SITE_OTHER)
Admission: RE | Admit: 2012-01-07 | Discharge: 2012-01-07 | Disposition: A | Discharge status: Home / Self Care | Payer: Medicare HMO | Source: Ambulatory Visit | Attending: Internal Medicine | Admitting: Internal Medicine

## 2012-01-07 DIAGNOSIS — R5383 Other fatigue: Secondary | ICD-10-CM

## 2012-01-07 DIAGNOSIS — R0602 Shortness of breath: Secondary | ICD-10-CM

## 2012-01-07 DIAGNOSIS — R5381 Other malaise: Secondary | ICD-10-CM

## 2012-01-07 MED ORDER — IOHEXOL 300 MG/ML  SOLN
80.0000 mL | Freq: Once | INTRAMUSCULAR | Status: AC | PRN
  Administered 2012-01-07: 80 mL via INTRAVENOUS

## 2012-01-10 ENCOUNTER — Encounter: Payer: Self-pay | Admitting: Internal Medicine

## 2012-01-10 ENCOUNTER — Telehealth: Payer: Self-pay | Admitting: *Deleted

## 2012-01-10 NOTE — Telephone Encounter (Signed)
Message copied by Elnora Morrison on Mon Jan 10, 2012  5:20 PM ------      Message from: Illene Regulus E      Created: Mon Jan 10, 2012  8:13 AM       Call pt. Bmet ok, PSA ok

## 2012-01-10 NOTE — Telephone Encounter (Signed)
Patient notified of lab results

## 2012-02-03 ENCOUNTER — Other Ambulatory Visit: Payer: Self-pay | Admitting: *Deleted

## 2012-02-03 MED ORDER — GABAPENTIN 300 MG PO CAPS: ORAL_CAPSULE | ORAL | Status: DC

## 2012-03-01 ENCOUNTER — Encounter: Payer: Self-pay | Admitting: Vascular Surgery

## 2012-08-03 ENCOUNTER — Other Ambulatory Visit: Payer: Self-pay

## 2012-08-03 MED ORDER — GABAPENTIN 300 MG PO CAPS: ORAL_CAPSULE | ORAL | Status: DC

## 2012-08-09 ENCOUNTER — Encounter: Payer: Self-pay | Admitting: Internal Medicine

## 2012-08-09 ENCOUNTER — Other Ambulatory Visit (INDEPENDENT_AMBULATORY_CARE_PROVIDER_SITE_OTHER): Payer: Medicare HMO

## 2012-08-09 ENCOUNTER — Ambulatory Visit (INDEPENDENT_AMBULATORY_CARE_PROVIDER_SITE_OTHER): Payer: Medicare HMO | Admitting: Internal Medicine

## 2012-08-09 VITALS — BP 152/80 | HR 83 | Temp 97.0°F | Wt 163.2 lb

## 2012-08-09 DIAGNOSIS — Z720 Tobacco use: Secondary | ICD-10-CM

## 2012-08-09 DIAGNOSIS — R5383 Other fatigue: Secondary | ICD-10-CM

## 2012-08-09 DIAGNOSIS — R634 Abnormal weight loss: Secondary | ICD-10-CM

## 2012-08-09 DIAGNOSIS — R5381 Other malaise: Secondary | ICD-10-CM

## 2012-08-09 DIAGNOSIS — F17200 Nicotine dependence, unspecified, uncomplicated: Secondary | ICD-10-CM

## 2012-08-09 DIAGNOSIS — R630 Anorexia: Secondary | ICD-10-CM

## 2012-08-09 DIAGNOSIS — Z79899 Other long term (current) drug therapy: Secondary | ICD-10-CM

## 2012-08-09 LAB — CBC WITH DIFFERENTIAL/PLATELET
Basophils Absolute: 0 K/uL (ref 0.0–0.1)
Basophils Relative: 0.5 % (ref 0.0–3.0)
Eosinophils Absolute: 0.1 K/uL (ref 0.0–0.7)
Eosinophils Relative: 1.7 % (ref 0.0–5.0)
HCT: 43.1 % (ref 39.0–52.0)
Hemoglobin: 14.4 g/dL (ref 13.0–17.0)
Lymphocytes Relative: 21.7 % (ref 12.0–46.0)
Lymphs Abs: 0.9 K/uL (ref 0.7–4.0)
MCHC: 33.4 g/dL (ref 30.0–36.0)
MCV: 94.3 fl (ref 78.0–100.0)
Monocytes Absolute: 0.6 K/uL (ref 0.1–1.0)
Monocytes Relative: 13.7 % — ABNORMAL HIGH (ref 3.0–12.0)
Neutro Abs: 2.6 K/uL (ref 1.4–7.7)
Neutrophils Relative %: 62.4 % (ref 43.0–77.0)
Platelets: 204 K/uL (ref 150.0–400.0)
RBC: 4.57 Mil/uL (ref 4.22–5.81)
RDW: 16.7 % — ABNORMAL HIGH (ref 11.5–14.6)
WBC: 4.1 K/uL — ABNORMAL LOW (ref 4.5–10.5)

## 2012-08-09 LAB — BASIC METABOLIC PANEL
BUN: 14 mg/dL (ref 6–23)
CO2: 30 mEq/L (ref 19–32)
Calcium: 10.1 mg/dL (ref 8.4–10.5)
Chloride: 108 mEq/L (ref 96–112)
Creatinine, Ser: 1.1 mg/dL (ref 0.4–1.5)
Glucose, Bld: 86 mg/dL (ref 70–99)
Potassium: 5.3 mEq/L — ABNORMAL HIGH (ref 3.5–5.1)

## 2012-08-09 LAB — HEPATIC FUNCTION PANEL
AST: 27 U/L (ref 0–37)
Alkaline Phosphatase: 113 U/L (ref 39–117)
Bilirubin, Direct: 0.1 mg/dL (ref 0.0–0.3)
Total Bilirubin: 0.6 mg/dL (ref 0.3–1.2)

## 2012-08-09 LAB — HEMOGLOBIN A1C: Hgb A1c MFr Bld: 6.2 % (ref 4.6–6.5)

## 2012-08-09 MED ORDER — VARENICLINE TARTRATE 0.5 MG PO TABS: 0.5000 mg | ORAL_TABLET | Freq: Two times a day (BID) | ORAL | Status: DC

## 2012-08-09 NOTE — Patient Instructions (Signed)
It was good to see you today. We have reviewed your prior records including labs and tests today Test(s) ordered today. Your results will be released to MyChart (or called to you) after review, usually within 72hours after test completion. If any changes need to be made, you will be notified at that same time. we'll make referral to GI specialist for evaluation of your stomach . Our office will contact you regarding appointment(s) once made. Medications reviewed and updated, Resume Chantix to help you quit smoking at this time - no other changes recommended at this time.  Your prescription(s) have been submitted to your pharmacy. Please take as directed and contact our office if you believe you are having problem(s) with the medication(s). followup with Dr. Debby Bud in 4-6 weeks, call sooner if problems

## 2012-08-09 NOTE — Assessment & Plan Note (Signed)
Continued fatigue with unexplained weight loss Continued tobacco abuse and longtime smoker Evaluation summer 2013 reviewed - unremarkable labs, CT chest September 2013 without malignancy Recheck labs now Refer to GI because of anorexia and weight loss -patient unable to recall last colonoscopy and denies prior EGD Followup with primary care physician in 6 weeks to continue review and workup as needed

## 2012-08-09 NOTE — Progress Notes (Signed)
Subjective:    Patient ID: Henry Carter, male    DOB: 1941-03-02, 72 y.o.   MRN: [id number]  HPI  Patient of Dr. Debby Bud' - here for six-month followup Previous evaluation summer 2013 for unexplained weight loss and fatigue reviewed  Continued anorexia with poor appetite admits to eating very little but denies abdominal pain, reflux or regurgitation  Continued generalized fatigue  Resumed smoking and requests refill on Chantix to help cessation efforts  Past Medical History  Diagnosis Date  . Peripheral vascular disease, unspecified   . Angioedema   . Essential hypertension   . Hyperlipidemia   . Personal history of tobacco use, presenting hazards to health   . IBS (irritable bowel syndrome)   . BPH (benign prostatic hyperplasia)   . Anemia   . Impotence     Review of Systems  Constitutional: Positive for appetite change (poor appetite with decrease intake), fatigue and unexpected weight change. Negative for fever and activity change.  Cardiovascular: Negative for chest pain, palpitations and leg swelling.       Denies BLE claudication sx  Gastrointestinal: Negative for nausea, abdominal pain, diarrhea, constipation and blood in stool.  Musculoskeletal: Negative for joint swelling and arthralgias.       Objective:   Physical Exam BP 152/80  Pulse 83  Temp(Src) 97 F (36.1 C) (Oral)  Wt 163 lb 3.2 oz (74.027 kg)  BMI 24.09 kg/m2  SpO2 98% Wt Readings from Last 3 Encounters:  08/09/12 163 lb 3.2 oz (74.027 kg)  01/05/12 176 lb (79.833 kg)  12/07/11 173 lb (78.472 kg)   Constitutional: he is thin, but appears well-developed and well-nourished. No distress.   Neck: Normal range of motion. Neck supple. No JVD present. No thyromegaly present.  Cardiovascular: Normal rate, regular rhythm and normal heart sounds.  No murmur heard. No BLE edema. Pulmonary/Chest: Effort normal and breath sounds normal. No respiratory distress. he has no wheezes.  Abdominal: Soft.  Bowel sounds are normal. he exhibits no distension. There is no tenderness. no masses Psychiatric: he has a normal mood and affect. behavior is normal. Judgment and thought content normal.  Lab Results  Component Value Date   WBC 5.3 12/07/2011   HGB 13.3 12/07/2011   HCT 41.1 12/07/2011   PLT 223.0 12/07/2011   GLUCOSE 83 01/05/2012   CHOL 187 12/07/2011   TRIG 106.0 12/07/2011   HDL 48.70 12/07/2011   LDLDIRECT 145.2 11/20/2010   LDLCALC 117* 12/07/2011   ALT 13 01/05/2012   AST 22 01/05/2012   NA 141 01/05/2012   K 4.9 01/05/2012   CL 104 01/05/2012   CREATININE 1.4 01/05/2012   BUN 11 01/05/2012   CO2 29 01/05/2012   TSH 0.92 12/07/2011   PSA 4.04* 01/05/2012   INR 0.9 ratio 01/27/2009   HGBA1C 6.1 12/07/2011   Ct Chest W Contrast  01/07/2012  *RADIOLOGY REPORT*  Clinical Data: Weight loss.  History of smoking.  Lethargy. Increasing shortness of breath.  CT CHEST WITH CONTRAST  Technique:  Multidetector CT imaging of the chest was performed following the standard protocol during bolus administration of intravenous contrast.  Contrast: 80mL OMNIPAQUE IOHEXOL 300 MG/ML  SOLN  Comparison: No priors.  Findings:  Mediastinum: There is a small amount of anterior pericardial fluid and/or thickening, unlikely to be of hemodynamic significance at this time.  No associated pericardial calcification.  Heart size is normal. No pathologically enlarged mediastinal or hilar lymph nodes. Esophagus is unremarkable in appearance.  Mild atherosclerosis of the thoracic  aorta.  Lungs/Pleura: Mild diffuse bronchial wall thickening.  Mild centrilobular and paraseptal emphysema with multiple bilateral apical blebs.  No definite suspicious appearing pulmonary nodules or masses are identified at this time.  No acute consolidative airspace disease.  No pleural effusions.  Upper Abdomen: 4 mm nonobstructive calculus in the upper pole collecting system of the left kidney.  Several subcentimeter low attenuation lesions are noted in  the visualized portion of the kidneys bilaterally, too small to definitively characterize. Calcification along the lesser curvature of the stomach is of uncertain etiology and significance.  Musculoskeletal: There are no aggressive appearing lytic or blastic lesions noted in the visualized portions of the skeleton.  IMPRESSION: 1.  Mild diffuse bronchial wall thickening with mild centrilobular paraseptal emphysema; findings compatible with underlying COPD. 2.  No suspicious appearing pulmonary nodules or masses at this time to suggest lung malignancy. 3.  Small amount of anterior pericardial fluid and/or thickening is unusual in appearance, but is unlikely to be of hemodynamic significance at this time.  No associated pericardial calcification. 3.  Mild atherosclerosis. 4.  4 mm nonobstructive calculus in the upper pole collecting system of the left kidney. 5.  Tiny low attenuation lesions in the upper poles of the kidneys bilaterally are too small to definitively characterize. Statistically, these are likely to represent tiny cysts.   Original Report Authenticated By: Florencia Reasons, M.D.       Assessment & Plan:   See problem list. Medications and labs reviewed today.  Continued tobacco abuse - 5 minutes today spent counseling patient on unhealthy effects of continued tobacco abuse and encouragement of cessation including medical options available to help the patient quit smoking. New prescription for Chantix prescribed, patient will resume same and refer back to primary

## 2012-08-14 ENCOUNTER — Encounter: Payer: Self-pay | Admitting: Internal Medicine

## 2012-09-08 ENCOUNTER — Telehealth: Payer: Self-pay

## 2012-09-08 ENCOUNTER — Ambulatory Visit (INDEPENDENT_AMBULATORY_CARE_PROVIDER_SITE_OTHER): Payer: Medicare HMO | Admitting: Internal Medicine

## 2012-09-08 ENCOUNTER — Encounter: Payer: Self-pay | Admitting: Internal Medicine

## 2012-09-08 VITALS — BP 110/64 | HR 90 | Ht 69.0 in | Wt 160.0 lb

## 2012-09-08 DIAGNOSIS — R197 Diarrhea, unspecified: Secondary | ICD-10-CM

## 2012-09-08 DIAGNOSIS — R634 Abnormal weight loss: Secondary | ICD-10-CM

## 2012-09-08 MED ORDER — NA SULFATE-K SULFATE-MG SULF 17.5-3.13-1.6 GM/177ML PO SOLN: ORAL | Status: DC

## 2012-09-08 NOTE — Telephone Encounter (Signed)
 [facility] [address]  [place] [address]    09/08/2012    RE: Henry Carter DOB: 08-28-1940 MRN: [id number]   Dear Dr. Debby Bud,    We have scheduled the above patient for an endoscopic procedure. Our records show that he is on anticoagulation therapy.   Please advise as to how long the patient may come off his therapy of Plavix prior to the colonoscopy procedure, which is scheduled for 09/20/12.  Please fax back/ or route the completed form to Ouida Abeyta [place], CMA at [phone].   Sincerely,  [place], Clayborne Dana

## 2012-09-08 NOTE — Patient Instructions (Addendum)
You have been scheduled for a colonoscopy at [facility]. Please follow written instructions given to you at your visit today.  Please use the suprep kit we have given you today. If you use inhalers (even only as needed), please bring them with you on the day of your procedure. Your physician has requested that you go to www.startemmi.com and enter the access code given to you at your visit today. This web site gives a general overview about your procedure. However, you should still follow specific instructions given to you by our office regarding your preparation for the procedure.   You will be contaced by our office prior to your procedure for directions on holding your Plavix. If you do not hear from our office 1 week prior to your scheduled procedure, please call [phone] to discuss.   I appreciate the opportunity to care for you.

## 2012-09-08 NOTE — Progress Notes (Signed)
Subjective:    Patient ID: Henry Carter, male    DOB: 02/22/1941, 72 y.o.   MRN: [id number]  HPI This patient is here with a 1-2 month history of loose bowel movements and diarrhea. He has anorexia and says he is losing weight. He has not started any new medications and there's been no travel. He has not experienced problems like this before. He reports that every time he goes to urinate he has to turnaround and sit in defecate and has very loose stools. There is no rectal bleeding, he denies abdominal pain, there is no nausea or vomiting. He is on Plavix for peripheral vascular disease and stents. He has never had a colonoscopy.  Medications, allergies, past medical history, past surgical history, family history and social history are reviewed and updated in the EMR.  Review of Systems All other ROS negative except as per HPI.    Objective:   Physical Exam General:  Well-developed, well-nourished and in no acute distress Eyes:  anicteric. ENT:   Mouth and posterior pharynx free of lesions.  Neck:   supple w/o thyromegaly or mass.  Lungs: Clear to auscultation bilaterally. Heart:  S1S2, no rubs, murmurs, gallops. Abdomen:  soft, non-tender, no hepatosplenomegaly, hernia, or mass and BS+.  Rectal: Normal tone, no mass, soft brown stool. He has a normal prostate Lymph:  no cervical or supraclavicular adenopathy. Extremities:   no edema Skin   no rash. Some tattoos Neuro:  A&O x 3.  Psych:  appropriate mood and  Affect.   Data Reviewed: Lab Results  Component Value Date   WBC 4.1* 08/09/2012   HGB 14.4 08/09/2012   HCT 43.1 08/09/2012   MCV 94.3 08/09/2012   PLT 204.0 08/09/2012   Lab Results  Component Value Date   TSH 1.09 08/09/2012       Assessment & Plan:   1. Diarrhea   2. Loss of weight   #3 Anorexia  1. Colonoscopy is indicated. He could have neoplasia/cancer, could have inflammatory bowel disease. The risks and benefits as well as alternatives of endoscopic  procedure(s) have been discussed and reviewed. All questions answered. The patient agrees to proceed. Further plans pending these results. Will request clearance to hold his Plavix for 5-7 days prior to the procedure from his primary care physician who appears to be the main prescribe her of this. He has peripheral vascular disease and this is been stable, he may continue aspirin. He has an appointment with Dr. Norins in 3 days.   Medication List                   TAKE these medications       aspirin 81 MG tablet  Take 81 mg by mouth daily.     clopidogrel 75 MG tablet  Commonly known as:  PLAVIX  Take 1 tablet (75 mg total) by mouth daily.     doxazosin 4 MG tablet  Commonly known as:  CARDURA  TAKE 1 TABLET BY MOUTH TWICE DAILY     esomeprazole 40 MG capsule  Commonly known as:  NEXIUM  Take 1 capsule (40 mg total) by mouth daily.     gabapentin 300 MG capsule  Commonly known as:  NEURONTIN  TAKE ONE CAPSULE BY MOUTH 3 TIMES A DAY     tadalafil 20 MG tablet  Commonly known as:  CIALIS  Take 20 mg by mouth daily as needed.        CC: Michael Norins, MD   

## 2012-09-11 ENCOUNTER — Other Ambulatory Visit: Payer: Medicare HMO

## 2012-09-11 ENCOUNTER — Encounter: Payer: Self-pay | Admitting: Internal Medicine

## 2012-09-11 ENCOUNTER — Ambulatory Visit (INDEPENDENT_AMBULATORY_CARE_PROVIDER_SITE_OTHER): Payer: Medicare HMO | Admitting: Internal Medicine

## 2012-09-11 VITALS — BP 130/90 | HR 59 | Temp 97.5°F | Wt 162.4 lb

## 2012-09-11 DIAGNOSIS — J449 Chronic obstructive pulmonary disease, unspecified: Secondary | ICD-10-CM

## 2012-09-11 DIAGNOSIS — I313 Pericardial effusion (noninflammatory): Secondary | ICD-10-CM

## 2012-09-11 DIAGNOSIS — I10 Essential (primary) hypertension: Secondary | ICD-10-CM

## 2012-09-11 DIAGNOSIS — R5381 Other malaise: Secondary | ICD-10-CM

## 2012-09-11 DIAGNOSIS — Z23 Encounter for immunization: Secondary | ICD-10-CM

## 2012-09-11 DIAGNOSIS — I319 Disease of pericardium, unspecified: Secondary | ICD-10-CM

## 2012-09-11 MED ORDER — TUBERCULIN PPD 5 UNIT/0.1ML ID SOLN: 5.0000 [IU] | Freq: Once | INTRADERMAL | Status: DC

## 2012-09-11 NOTE — Assessment & Plan Note (Addendum)
Patient with continued weakness and marked weight loss: 20 lbs since September, 30+ pounds over the past year. Lab eval, CT chest are negative to date except for pericardial thickening. He admits to anorexia. He has seen Dr. Leone Payor in consult and is for colonoscopy June 6th. Additional consideration is the possibility of latent TB with recrudescence. He does have exposure to a high risk population.  Plan Colonoscopy as planned.  If negative, with poor appetite and early satiety he may be a candidate for EGD  R/o TB: placed PPD today to be read in 48-72 hours, Quanterferon TB gold

## 2012-09-11 NOTE — Patient Instructions (Addendum)
1. Blood pressure BP Readings from Last 3 Encounters:  09/11/12 130/90  09/08/12 110/64  08/09/12 152/80   Good control on present medication  2. PUlmonary - CT in the fall did reveal centro-lobular emphysema. This is the primary cause for shortness of breath and shortness of breath with exertion.  3. Infection - the CT in the fall did reveal a small pericardial effusion (fluid between the sack surrounding the heart and the heart wall) vs thickening of the pericardium (the sack). This was not thought to be causing any heart problems but this can be seen with latent (asymptomatic) TB. You have contact with a high risk population. This could also be an explanation for weight loss.  Plan TB skin test  Blood test for TB  4. Weight loss - most of the testing done in September '13 covers the territory. With the decreased appetite and continued weight loss doing a colonoscopy, as scheduled, is important to rule out any colon cancer that could be the villain. You need to be off plavix for 5 days.

## 2012-09-11 NOTE — Telephone Encounter (Signed)
5 days

## 2012-09-11 NOTE — Assessment & Plan Note (Signed)
Patient with definite DOE/SOB in a setting of advanced Emphysema. Discussed mechanism of disease. He currently is not SOB at rest nor in need of oxygen. He has once again stopped smoking.  Plan - continue smoking cessation

## 2012-09-11 NOTE — Progress Notes (Signed)
  Subjective:    Patient ID: Henry Carter, male    DOB: December 30, 1940, 72 y.o.   MRN: [id number]  HPI  Henry Carter presents due to continued DOE/SOB, weight loss, anoxrexia. Reveiwed chart going back to Sept '13 and all the tests: all labs were normal, CT chest with pericardial effusion vs pericardial thickening, centrolobular emphysema but no mass or adenopathy. He was seen by Henry Carter April '14 - note reviewed and he was referred to GI. He saw Henry Carter for anorexia and weight loss May 30th and he is scheduled for colonoscopy to r/o malignancy.  Henry Carter does not have focal symptoms. He reports that he does have progressive DOE. 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   PMH, FamHx and SocHx reviewed for any changes and relevance.  Current Outpatient Prescriptions on File Prior to Visit  Medication Sig Dispense Refill  . aspirin 81 MG tablet Take 81 mg by mouth daily.        . clopidogrel (PLAVIX) 75 MG tablet Take 1 tablet (75 mg total) by mouth daily.  90 tablet  3  . doxazosin (CARDURA) 4 MG tablet TAKE 1 TABLET BY MOUTH TWICE DAILY  180 tablet  3  . gabapentin (NEURONTIN) 300 MG capsule TAKE ONE CAPSULE BY MOUTH 3 TIMES A DAY  90 capsule  3  . Na Sulfate-K Sulfate-Mg Sulf (SUPREP BOWEL PREP) SOLN Use as directed  2 Bottle  0  . tadalafil (CIALIS) 20 MG tablet Take 20 mg by mouth daily as needed.        Marland Kitchen esomeprazole (NEXIUM) 40 MG capsule Take 1 capsule (40 mg total) by mouth daily.  30 capsule  11   No current facility-administered medications on file prior to visit.     Review of Systems System review is negative for any constitutional, cardiac, pulmonary, GI or neuro symptoms or complaints other than as described in the HPI.     Objective:   Physical Exam Filed Vitals:   09/11/12 1415  BP: 130/90  Pulse: 59  Temp: 97.5 F (36.4 C)   Wt Readings from Last 3 Encounters:  09/11/12 162 lb 6.4 oz  (73.664 kg)  09/08/12 160 lb (72.576 kg)  08/09/12 163 lb 3.2 oz (74.027 kg)          Assessment & Plan:

## 2012-09-11 NOTE — Assessment & Plan Note (Signed)
BP Readings from Last 3 Encounters:  09/11/12 130/90  09/08/12 110/64  08/09/12 152/80   Good control

## 2012-09-12 NOTE — Telephone Encounter (Signed)
LM for patient to call back to discuss plans for holding Plavix.

## 2012-09-13 LAB — QUANTIFERON TB GOLD ASSAY (BLOOD)
Mitogen value: >10 IU/mL
Quantiferon Tb Ag Minus Nil Value: 0 IU/mL

## 2012-09-13 NOTE — Telephone Encounter (Signed)
Spoke to patient , informed him to hold his Plavix for 5 days per Dr. Debby Bud prior to his colonoscopy.  He thought his colonoscopy was 09/15/12 so he didn't take his Plavix 09/12/12.  Correct date confirmed with him today: 09/20/12 colonoscopy at [facility], so last dose of Plavix 09/14/12.  He verbalized understanding.

## 2012-09-18 ENCOUNTER — Ambulatory Visit: Payer: Medicare HMO

## 2012-09-20 ENCOUNTER — Encounter (HOSPITAL_COMMUNITY): Payer: Self-pay | Admitting: *Deleted

## 2012-09-20 ENCOUNTER — Encounter: Payer: Self-pay | Admitting: Internal Medicine

## 2012-09-20 ENCOUNTER — Ambulatory Visit (HOSPITAL_COMMUNITY)
Admission: RE | Admit: 2012-09-20 | Discharge: 2012-09-20 | Disposition: A | Discharge status: Home / Self Care | Payer: Medicare HMO | Source: Ambulatory Visit | Attending: Internal Medicine | Admitting: Internal Medicine

## 2012-09-20 DIAGNOSIS — R634 Abnormal weight loss: Secondary | ICD-10-CM

## 2012-09-20 DIAGNOSIS — R197 Diarrhea, unspecified: Secondary | ICD-10-CM

## 2012-09-20 MED ORDER — SODIUM CHLORIDE 0.9 % IV SOLN: INTRAVENOUS | Status: DC

## 2012-09-20 NOTE — Progress Notes (Signed)
Patient ID: Henry Carter, male   DOB: 1941-02-25, 72 y.o.   MRN: [id number] Patient was unable to do his colonoscopy at [facility] today b/c he did not use prep kit.  He was given another suprep sample kit and the procedure is set up for 09/22/12 at 9:30 AM, arrive at 8AM.  Went over instructions and new date and time with him and his wife.  They verbalized understanding.

## 2012-09-22 ENCOUNTER — Ambulatory Visit (HOSPITAL_COMMUNITY)
Admission: RE | Admit: 2012-09-22 | Discharge: 2012-09-22 | Disposition: A | Discharge status: Home / Self Care | Payer: Medicare HMO | Source: Ambulatory Visit | Attending: Internal Medicine | Admitting: Internal Medicine

## 2012-09-22 ENCOUNTER — Encounter (HOSPITAL_COMMUNITY): Payer: Self-pay | Admitting: *Deleted

## 2012-09-22 ENCOUNTER — Encounter (HOSPITAL_COMMUNITY)
Admission: RE | Disposition: A | Discharge status: Home / Self Care | Payer: Self-pay | Source: Ambulatory Visit | Attending: Internal Medicine

## 2012-09-22 DIAGNOSIS — Z860100 Personal history of colon polyps, unspecified: Secondary | ICD-10-CM | POA: Diagnosis present

## 2012-09-22 DIAGNOSIS — D126 Benign neoplasm of colon, unspecified: Secondary | ICD-10-CM | POA: Insufficient documentation

## 2012-09-22 DIAGNOSIS — Z7902 Long term (current) use of antithrombotics/antiplatelets: Secondary | ICD-10-CM | POA: Insufficient documentation

## 2012-09-22 DIAGNOSIS — R630 Anorexia: Secondary | ICD-10-CM | POA: Insufficient documentation

## 2012-09-22 DIAGNOSIS — Z79899 Other long term (current) drug therapy: Secondary | ICD-10-CM | POA: Insufficient documentation

## 2012-09-22 DIAGNOSIS — R634 Abnormal weight loss: Secondary | ICD-10-CM | POA: Insufficient documentation

## 2012-09-22 DIAGNOSIS — R197 Diarrhea, unspecified: Secondary | ICD-10-CM | POA: Insufficient documentation

## 2012-09-22 DIAGNOSIS — I739 Peripheral vascular disease, unspecified: Secondary | ICD-10-CM | POA: Insufficient documentation

## 2012-09-22 HISTORY — DX: Personal history of colon polyps, unspecified: Z86.0100

## 2012-09-22 HISTORY — PX: COLONOSCOPY: SHX5424

## 2012-09-22 HISTORY — DX: Personal history of colonic polyps: Z86.010

## 2012-09-22 SURGERY — COLONOSCOPY
Anesthesia: Moderate Sedation

## 2012-09-22 MED ORDER — FENTANYL CITRATE 0.05 MG/ML IJ SOLN
INTRAMUSCULAR | Status: AC
  Filled 2012-09-22: qty 4

## 2012-09-22 MED ORDER — MIDAZOLAM HCL 5 MG/5ML IJ SOLN
INTRAMUSCULAR | Status: DC | PRN
  Administered 2012-09-22: 1 mg via INTRAVENOUS
  Administered 2012-09-22 (×3): 2 mg via INTRAVENOUS

## 2012-09-22 MED ORDER — MIDAZOLAM HCL 10 MG/2ML IJ SOLN
INTRAMUSCULAR | Status: AC
  Filled 2012-09-22: qty 4

## 2012-09-22 MED ORDER — LOPERAMIDE HCL 2 MG PO TABS: 2.0000 mg | ORAL_TABLET | Freq: Four times a day (QID) | ORAL | Status: DC | PRN

## 2012-09-22 MED ORDER — FENTANYL CITRATE 0.05 MG/ML IJ SOLN
INTRAMUSCULAR | Status: DC | PRN
  Administered 2012-09-22 (×3): 25 ug via INTRAVENOUS

## 2012-09-22 MED ORDER — SODIUM CHLORIDE 0.9 % IV SOLN: INTRAVENOUS | Status: DC

## 2012-09-22 MED ORDER — DIPHENHYDRAMINE HCL 50 MG/ML IJ SOLN
INTRAMUSCULAR | Status: AC
  Filled 2012-09-22: qty 1

## 2012-09-22 NOTE — Interval H&P Note (Signed)
History and Physical Interval Note:  09/22/2012 8:33 AM  Henry Carter  has presented today for surgery, with the diagnosis of diarrhea  The various methods of treatment have been discussed with the patient and family. After consideration of risks, benefits and other options for treatment, the patient has consented to  Procedure(s): COLONOSCOPY (N/A) as a surgical intervention .  The patient's history has been reviewed, patient examined, no change in status, stable for surgery.  I have reviewed the patient's chart and labs.  Questions were answered to the patient's satisfaction.     Stan Head

## 2012-09-22 NOTE — H&P (View-Only) (Signed)
Subjective:    Patient ID: Henry Carter, male    DOB: 1940-08-08, 72 y.o.   MRN: [id number]  HPI This patient is here with a 1-2 month history of loose bowel movements and diarrhea. He has anorexia and says he is losing weight. He has not started any new medications and there's been no travel. He has not experienced problems like this before. He reports that every time he goes to urinate he has to turnaround and sit in defecate and has very loose stools. There is no rectal bleeding, he denies abdominal pain, there is no nausea or vomiting. He is on Plavix for peripheral vascular disease and stents. He has never had a colonoscopy.  Medications, allergies, past medical history, past surgical history, family history and social history are reviewed and updated in the EMR.  Review of Systems All other ROS negative except as per HPI.    Objective:   Physical Exam General:  Well-developed, well-nourished and in no acute distress Eyes:  anicteric. ENT:   Mouth and posterior pharynx free of lesions.  Neck:   supple w/o thyromegaly or mass.  Lungs: Clear to auscultation bilaterally. Heart:  S1S2, no rubs, murmurs, gallops. Abdomen:  soft, non-tender, no hepatosplenomegaly, hernia, or mass and BS+.  Rectal: Normal tone, no mass, soft brown stool. He has a normal prostate Lymph:  no cervical or supraclavicular adenopathy. Extremities:   no edema Skin   no rash. Some tattoos Neuro:  A&O x 3.  Psych:  appropriate mood and  Affect.   Data Reviewed: Lab Results  Component Value Date   WBC 4.1* 08/09/2012   HGB 14.4 08/09/2012   HCT 43.1 08/09/2012   MCV 94.3 08/09/2012   PLT 204.0 08/09/2012   Lab Results  Component Value Date   TSH 1.09 08/09/2012       Assessment & Plan:   1. Diarrhea   2. Loss of weight   #3 Anorexia  1. Colonoscopy is indicated. He could have neoplasia/cancer, could have inflammatory bowel disease. The risks and benefits as well as alternatives of endoscopic  procedure(s) have been discussed and reviewed. All questions answered. The patient agrees to proceed. Further plans pending these results. Will request clearance to hold his Plavix for 5-7 days prior to the procedure from his primary care physician who appears to be the main prescribe her of this. He has peripheral vascular disease and this is been stable, he may continue aspirin. He has an appointment with Dr. Debby Bud in 3 days.   Medication List                   TAKE these medications       aspirin 81 MG tablet  Take 81 mg by mouth daily.     clopidogrel 75 MG tablet  Commonly known as:  PLAVIX  Take 1 tablet (75 mg total) by mouth daily.     doxazosin 4 MG tablet  Commonly known as:  CARDURA  TAKE 1 TABLET BY MOUTH TWICE DAILY     esomeprazole 40 MG capsule  Commonly known as:  NEXIUM  Take 1 capsule (40 mg total) by mouth daily.     gabapentin 300 MG capsule  Commonly known as:  NEURONTIN  TAKE ONE CAPSULE BY MOUTH 3 TIMES A DAY     tadalafil 20 MG tablet  Commonly known as:  CIALIS  Take 20 mg by mouth daily as needed.        CC: Illene Regulus, MD

## 2012-09-22 NOTE — Op Note (Signed)
[facility] [address] [place], [address]   COLONOSCOPY PROCEDURE REPORT  PATIENT: Henry Carter, Henry Carter  MR#: [id number] BIRTHDATE: Sep 16, 1940 , 72  yrs. old GENDER: Male ENDOSCOPIST: Iva Boop, MD, [facility] PROCEDURE DATE:  09/22/2012 PROCEDURE:   Colonoscopy with biopsy ASA CLASS:   Class II INDICATIONS:chronic diarrhea. MEDICATIONS: Fentanyl 75 mcg IV and Versed 7 mg IV  DESCRIPTION OF PROCEDURE:   After the risks benefits and alternatives of the procedure were thoroughly explained, informed consent was obtained.  A digital rectal exam revealed no rectal mass and A digital rectal exam revealed no prostatic nodules.   The Pentax Colonoscope [id number]  endoscope was introduced through the anus and advanced to the terminal ileum which was intubated for a short distance. No adverse events experienced.   The quality of the prep was Suprep good  The instrument was then slowly withdrawn as the colon was fully examined.      COLON FINDINGS: A diminutive sessile polyp was found in the right colon.  A polypectomy was performed with cold forceps.  The resection was complete and the polyp tissue was completely retrieved.   The mucosa appeared normal in the terminal ileum. The colon mucosa was otherwise normal, multiple random biopsies taken. A right colon retroflexion was performed.  Retroflexed views revealed no abnormalities. The time to cecum=2 minutes 0 seconds. Withdrawal time=12 minutes 0 seconds.  The scope was withdrawn and the procedure completed. COMPLICATIONS: There were no complications.  ENDOSCOPIC IMPRESSION: 1.   Diminutive sessile polyp was found in the left colon; polypectomy was performed with cold forceps 2.   Normal mucosa in the terminal ileum 3.   The colon mucosa was otherwise normal - random biopsies taken  RECOMMENDATIONS: 1.  Office will call with the results and plans next week 2.   Imodium AD as needed 3.   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

## 2012-09-26 ENCOUNTER — Encounter: Payer: Self-pay | Admitting: Internal Medicine

## 2012-09-26 NOTE — Progress Notes (Signed)
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

## 2012-10-02 ENCOUNTER — Ambulatory Visit (AMBULATORY_SURGERY_CENTER): Payer: Medicare HMO | Admitting: *Deleted

## 2012-10-02 VITALS — Ht 70.0 in | Wt 160.4 lb

## 2012-10-02 DIAGNOSIS — R197 Diarrhea, unspecified: Secondary | ICD-10-CM

## 2012-10-02 DIAGNOSIS — R634 Abnormal weight loss: Secondary | ICD-10-CM

## 2012-10-03 ENCOUNTER — Encounter: Payer: Self-pay | Admitting: Internal Medicine

## 2012-10-06 ENCOUNTER — Encounter: Payer: Medicare HMO | Admitting: Internal Medicine

## 2012-10-09 ENCOUNTER — Encounter: Payer: Self-pay | Admitting: *Deleted

## 2012-10-09 NOTE — Telephone Encounter (Signed)
Erroneous encounter

## 2012-10-10 ENCOUNTER — Ambulatory Visit (AMBULATORY_SURGERY_CENTER): Payer: Medicare HMO | Admitting: Internal Medicine

## 2012-10-10 ENCOUNTER — Encounter: Payer: Self-pay | Admitting: Internal Medicine

## 2012-10-10 VITALS — BP 162/88 | HR 44 | Temp 96.6°F | Resp 17 | Ht 70.0 in | Wt 160.0 lb

## 2012-10-10 DIAGNOSIS — R197 Diarrhea, unspecified: Secondary | ICD-10-CM

## 2012-10-10 DIAGNOSIS — R634 Abnormal weight loss: Secondary | ICD-10-CM

## 2012-10-10 DIAGNOSIS — B9681 Helicobacter pylori [H. pylori] as the cause of diseases classified elsewhere: Secondary | ICD-10-CM

## 2012-10-10 HISTORY — DX: Helicobacter pylori (H. pylori) as the cause of diseases classified elsewhere: B96.81

## 2012-10-10 MED ORDER — SODIUM CHLORIDE 0.9 % IV SOLN: 500.0000 mL | INTRAVENOUS | Status: DC

## 2012-10-10 NOTE — Progress Notes (Signed)
Lidocaine-40mg IV prior to Propofol InductionPropofol given over incremental dosages 

## 2012-10-10 NOTE — Progress Notes (Signed)
Patient did not experience any of the following events: a burn prior to discharge; a fall within the facility; wrong site/side/patient/procedure/implant event; or a hospital transfer or hospital admission upon discharge from the facility. (G8907)Patient did not have preoperative order for IV antibiotic SSI prophylaxis. (G8918) ewm 

## 2012-10-10 NOTE — Patient Instructions (Addendum)
No signs of ulcer or cancer that I saw. There were signs of inflammation in the stomach and intestine so I took biopsies - will let you  Know - should be by beginning of next week.  Hang in there and hopefully will have an answer soon.  I appreciate the opportunity to care for you. Iva Boop, MD, FACG  YOU HAD AN ENDOSCOPIC PROCEDURE TODAY AT THE [facility]: Refer to the procedure report that was given to you for any specific questions about what was found during the examination.  If the procedure report does not answer your questions, please call your gastroenterologist to clarify.  If you requested that your care partner not be given the details of your procedure findings, then the procedure report has been included in a sealed envelope for you to review at your convenience later.  YOU SHOULD EXPECT: Some feelings of bloating in the abdomen. Passage of more gas than usual.  Walking can help get rid of the air that was put into your GI tract during the procedure and reduce the bloating.  DIET: Your first meal following the procedure should be a light meal and then it is ok to progress to your normal diet.  A half-sandwich or bowl of soup is an example of a good first meal.  Heavy or fried foods are harder to digest and may make you feel nauseous or bloated.  Likewise meals heavy in dairy and vegetables can cause extra gas to form and this can also increase the bloating.  Drink plenty of fluids but you should avoid alcoholic beverages for 24 hours.  ACTIVITY: Your care partner should take you home directly after the procedure.  You should plan to take it easy, moving slowly for the rest of the day.  You can resume normal activity the day after the procedure however you should NOT DRIVE or use heavy machinery for 24 hours (because of the sedation medicines used during the test).    SYMPTOMS TO REPORT IMMEDIATELY: A gastroenterologist can be reached at any hour.  During normal  business hours, 8:30 AM to 5:00 PM Monday through Friday, call [phone].  After hours and on weekends, please call the GI answering service at [phone]  Emergency number who will take a message and have the physician on call contact you.   Following upper endoscopy (EGD)  Vomiting of blood or coffee ground material  New chest pain or pain under the shoulder blades  Painful or persistently difficult swallowing  New shortness of breath  Fever of 100F or higher  Black, tarry-looking stools  FOLLOW UP: If any biopsies were taken you will be contacted by phone or by letter within the next 1-3 weeks.  Call your gastroenterologist if you have not heard about the biopsies in 3 weeks.  Our staff will call the home number listed on your records the next business day following your procedure to check on you and address any questions or concerns that you may have at that time regarding the information given to you following your procedure. This is a courtesy call and so if there is no answer at the home number and we have not heard from you through the emergency physician on call, we will assume that you have returned to your regular daily activities without incident.  SIGNATURES/CONFIDENTIALITY: You and/or your care partner have signed paperwork which will be entered into your electronic medical record.  These signatures attest to the fact that that  the information above on your After Visit Summary has been reviewed and is understood.  Full responsibility of the confidentiality of this discharge information lies with you and/or your care-partner.  Handout on gastritis  Office will call with results

## 2012-10-10 NOTE — Progress Notes (Signed)
Called to room to assist during endoscopic procedure.  Patient ID and intended procedure confirmed with present staff. Received instructions for my participation in the procedure from the performing physician.  

## 2012-10-10 NOTE — Op Note (Signed)
[facility] [address] [place], [address]   ENDOSCOPY PROCEDURE REPORT  PATIENT: Klayten, Jolliff  MR#: [id number] BIRTHDATE: 09/19/1940 , 72  yrs. old GENDER: Male ENDOSCOPIST: Iva Boop, MD, [facility] PROCEDURE DATE:  10/10/2012 PROCEDURE:  EGD w/ biopsy ASA CLASS:     Class III INDICATIONS:  Unexplained diarrhea.   Weight loss. MEDICATIONS: propofol (Diprivan) 100mg  IV, MAC sedation, administered by CRNA, and These medications were titrated to patient response per physician's verbal order TOPICAL ANESTHETIC: none  DESCRIPTION OF PROCEDURE: After the risks benefits and alternatives of the procedure were thoroughly explained, informed consent was obtained.  The LB [id number] endoscope was introduced through the mouth and advanced to the second portion of the duodenum. Limited by small amount of food residue in body of stomach.  The instrument was slowly withdrawn as the mucosa was fully examined.        STOMACH: Mild and moderate gastritis (inflammation) was suspected in the gastric body and gastric antrum. erythema and edema. Multiple biopsies were performed using cold forceps.  Sample sent for histology.   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   Food residue, small amount was found in the gastric body, obscuring small area of mucosa.  DUODENUM: Abnormal mucosa was found in the duodenal bulb and 2nd part of the duodenum.  The mucosa had granularity, hypertrophic papillae  and clefts  Multiple biopsies were performed using cold forceps.  Sample sent for histology.  The remainder of the upper endoscopy exam was otherwise normal. Retroflexed views revealed no abnormalities.     The scope was then withdrawn from the patient and the procedure completed.  COMPLICATIONS: There were no complications.       ENDOSCOPIC IMPRESSION: 1.   Gastritis (inflammation) was  suspected in the gastric body and gastric antrum; multiple biopsies 2.   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   Food residue in the gastric body 4.   Abnormal mucosa was found in the duodenal bulb and 2nd part of the duodenum; The mucosa had granularity, clefts and hypertrophic papillae; multiple biopsies 5.   The remainder of the upper endoscopy exam was otherwise normal  RECOMMENDATIONS: Office will call with results - he could need repeat pericardial imaging He has not had bleeding problems from AVM that I know but is on clopidogrel so is at higher risk. Keep in mindover time and if develops anemia - could need ablation (off clopidogrel).    eSigned:  Iva Boop, MD, [facility] 10/10/2012 9:54 AM   OZ:[id number] Esther Hardy, MD and The Patient  PATIENT NAME:  Ryman, Rathgeber MR#: [id number]

## 2012-10-11 ENCOUNTER — Telehealth: Payer: Self-pay | Admitting: *Deleted

## 2012-10-11 NOTE — Telephone Encounter (Signed)
  Follow up Call-  Call back number 10/10/2012  Post procedure Call Back phone  # [phone]  Permission to leave phone message Yes     Patient questions:  Do you have a fever, pain , or abdominal swelling? no Pain Score  0 *  Have you tolerated food without any problems? yes  Have you been able to return to your normal activities? yes  Do you have any questions about your discharge instructions: Diet   no Medications  no Follow up visit  no  Do you have questions or concerns about your Care? no  Actions: * If pain score is 4 or above: No action needed, pain <4.

## 2012-10-12 ENCOUNTER — Encounter: Payer: Medicare HMO | Admitting: Internal Medicine

## 2012-10-20 ENCOUNTER — Encounter: Payer: Self-pay | Admitting: Internal Medicine

## 2012-10-20 ENCOUNTER — Ambulatory Visit (INDEPENDENT_AMBULATORY_CARE_PROVIDER_SITE_OTHER): Payer: Medicare HMO | Admitting: Internal Medicine

## 2012-10-20 VITALS — BP 140/80 | HR 64 | Ht 69.0 in | Wt 162.0 lb

## 2012-10-20 DIAGNOSIS — R5381 Other malaise: Secondary | ICD-10-CM

## 2012-10-20 DIAGNOSIS — B9681 Helicobacter pylori [H. pylori] as the cause of diseases classified elsewhere: Secondary | ICD-10-CM | POA: Insufficient documentation

## 2012-10-20 DIAGNOSIS — R634 Abnormal weight loss: Secondary | ICD-10-CM

## 2012-10-20 DIAGNOSIS — K2970 Gastritis, unspecified, without bleeding: Secondary | ICD-10-CM

## 2012-10-20 DIAGNOSIS — A048 Other specified bacterial intestinal infections: Secondary | ICD-10-CM

## 2012-10-20 DIAGNOSIS — R197 Diarrhea, unspecified: Secondary | ICD-10-CM

## 2012-10-20 MED ORDER — BIS SUBCIT-METRONID-TETRACYC 140-125-125 MG PO CAPS: 3.0000 | ORAL_CAPSULE | Freq: Three times a day (TID) | ORAL | Status: DC

## 2012-10-20 NOTE — Patient Instructions (Addendum)
Please pick up the Pylera medication at your pharmacy. If it is too expensive - let me know and I will see if we can change to something else though it is one of the best. You must take Nexium every day while on the Pylera. Ask Dr. Debby Bud about fatigue medication side effects.   I appreciate the opportunity to care for you. Iva Boop, MD, [facility]   Canceled rx at pharmacy, found pylera samples for patient.

## 2012-10-20 NOTE — Progress Notes (Signed)
  Subjective:    Patient ID: Henry Carter, male    DOB: 03-30-1941, 72 y.o.   MRN: [id number]  HPI Patient is here to followup with his wife. He has been having diarrhea problems and weight loss. He had a colonoscopy that was unrevealing and then he had an upper GI endoscopy that showed H. pylori gastritis. He reports less diarrhea. He is forcing himself to eat.Still fatigued. Medications, allergies, past medical history, past surgical history, family history and social history are reviewed and updated in the EMR.  Review of Systems As per history of present illness    Objective:   Physical Exam NAD  Wt Readings from Last 3 Encounters:  10/20/12 162 lb (73.483 kg)  10/10/12 160 lb (72.576 kg)  10/02/12 160 lb 6.4 oz (72.757 kg)       Assessment & Plan:   1. Helicobacter pylori gastritis   2. Diarrhea   3. Loss of weight   4. Other malaise and fatigue    He seems to be improving. I'm going to treat him with Pylera and Nexium. He will see me 2 months. Hopefully she will continue to improve. He did have some pericardial thickening on a previous CT of the chest without significant changes on an echocardiogram. I had wondered about the possibility of a constrictive pericarditis-type problem developing leading to lymphatic obstruction and diarrhea and malabsorption but I'm less enthusiastic that that is likely now.   He will followup with Dr. Debby Bud regarding the fatigue issues. We did review that gabapentin and Cardura could contribute to he indicates that gabapentin has been helpful. His wife is concerned about the fatigue and how easily he falls asleep.   I appreciate the opportunity to care for this patient.   CC: Illene Regulus, MD

## 2012-10-25 ENCOUNTER — Telehealth: Payer: Self-pay | Admitting: Internal Medicine

## 2012-10-25 NOTE — Telephone Encounter (Signed)
Discussed with the patient the importance of taking Pylera.  He will try and do the best he can, he states,  He will call back for any questions or concerns

## 2012-10-25 NOTE — Telephone Encounter (Signed)
Left message for patient to call back  

## 2012-11-15 ENCOUNTER — Other Ambulatory Visit: Payer: Self-pay

## 2012-12-12 ENCOUNTER — Telehealth: Payer: Self-pay | Admitting: Internal Medicine

## 2012-12-12 DIAGNOSIS — A048 Other specified bacterial intestinal infections: Secondary | ICD-10-CM

## 2012-12-12 NOTE — Telephone Encounter (Signed)
Patient reports that  "when I take that Pylera it makes me burn like I have the syphilis".  He reports he took about 1 1/2 weeks of the Pylera, but did not finish the rx.  He reports that this was 2 weeks ago when he quit taking Pylera.  He is advised that he needs to see his primary care MD as this is not a side effect of Pylera.  He wants to get a rx for another H. Pylori treatment.  He is asked to call me back and let me know how many days he took.  He reports he will call back this pm.

## 2012-12-13 NOTE — Telephone Encounter (Signed)
Left message for patient to call back  

## 2012-12-14 NOTE — Telephone Encounter (Signed)
Dr. Leone Payor the patient has not returned my call. Will he need alternate h. Pylori treatment.  He says he took for a week and a half??? But did not finish the rx.

## 2012-12-14 NOTE — Telephone Encounter (Signed)
He should do an H. Pylori stool antigen and should be off PPI 2 weeks before doing that). Needs REV me also sept/Oct

## 2012-12-15 NOTE — Telephone Encounter (Signed)
Patient notified Follow up scheduled for 01/17/13

## 2012-12-25 ENCOUNTER — Other Ambulatory Visit: Payer: Medicare HMO

## 2012-12-25 DIAGNOSIS — A048 Other specified bacterial intestinal infections: Secondary | ICD-10-CM

## 2012-12-27 LAB — H. PYLORI ANTIGEN, STOOL: H pylori Ag, Stl: NEGATIVE

## 2012-12-29 ENCOUNTER — Encounter: Payer: Self-pay | Admitting: Internal Medicine

## 2012-12-29 NOTE — Progress Notes (Signed)
Quick Note:  H pylori is gone ______

## 2013-01-15 ENCOUNTER — Other Ambulatory Visit: Payer: Self-pay | Admitting: Internal Medicine

## 2013-01-17 ENCOUNTER — Other Ambulatory Visit (INDEPENDENT_AMBULATORY_CARE_PROVIDER_SITE_OTHER): Payer: Medicare HMO

## 2013-01-17 ENCOUNTER — Ambulatory Visit (INDEPENDENT_AMBULATORY_CARE_PROVIDER_SITE_OTHER): Payer: Medicare HMO | Admitting: Internal Medicine

## 2013-01-17 ENCOUNTER — Encounter: Payer: Self-pay | Admitting: Internal Medicine

## 2013-01-17 VITALS — BP 160/86 | HR 56 | Ht 69.0 in | Wt 163.1 lb

## 2013-01-17 DIAGNOSIS — R5381 Other malaise: Secondary | ICD-10-CM

## 2013-01-17 DIAGNOSIS — A048 Other specified bacterial intestinal infections: Secondary | ICD-10-CM

## 2013-01-17 DIAGNOSIS — B9681 Helicobacter pylori [H. pylori] as the cause of diseases classified elsewhere: Secondary | ICD-10-CM

## 2013-01-17 LAB — FERRITIN: Ferritin: 16 ng/mL — ABNORMAL LOW (ref 22.0–322.0)

## 2013-01-17 LAB — VITAMIN B12: Vitamin B-12: 394 pg/mL (ref 211–911)

## 2013-01-17 NOTE — Assessment & Plan Note (Signed)
Feels much better - no diarrhea, weight up slightly, appetite better fatigue better

## 2013-01-17 NOTE — Assessment & Plan Note (Signed)
Better after H. Pylori tx. Has persistent high RDW Will check ferritin and B12 Lab Results  Component Value Date   TSH 1.09 08/09/2012

## 2013-01-17 NOTE — Progress Notes (Signed)
  Subjective:    Patient ID: Henry Carter, male    DOB: 23-Dec-1940, 72 y.o.   MRN: [id number]  HPI The patient is here for followup after he was treated for H. pylori gastritis, with Pylera. Since that time he has noted less fatigue. He is not having nausea, he is not having diarrhea. He still has some fatigue. He says he picked up his golf club for the first time in 3 years lately. His weight is stable to slightly increased.  Wt Readings from Last 3 Encounters:  01/17/13 163 lb 2 oz (73.993 kg)  10/20/12 162 lb (73.483 kg)  10/10/12 160 lb (72.576 kg)   Medications, allergies, past medical history, past surgical history, family history and social history are reviewed and updated in the EMR.   Review of Systems As per history of present illness, he is having some knee pain and tends to see Dr. Debby Bud    Objective:   Physical Exam Well-developed elderly black man in no acute distress    Assessment & Plan:   1. Helicobacter pylori gastritis   2. Other malaise and fatigue

## 2013-01-17 NOTE — Patient Instructions (Addendum)
Your physician has requested that you go to the basement for the following lab work before leaving today: B-12, Ferritin  Follow up with [place] as needed.   I appreciate the opportunity to care for you.

## 2013-01-18 ENCOUNTER — Encounter: Payer: Self-pay | Admitting: Internal Medicine

## 2013-01-18 DIAGNOSIS — D509 Iron deficiency anemia, unspecified: Secondary | ICD-10-CM

## 2013-01-18 DIAGNOSIS — D500 Iron deficiency anemia secondary to blood loss (chronic): Secondary | ICD-10-CM | POA: Insufficient documentation

## 2013-01-18 HISTORY — DX: Iron deficiency anemia, unspecified: D50.9

## 2013-01-18 NOTE — Progress Notes (Signed)
Quick Note:  Iron is low, B12 ok  Ferrous sulfate 325 mg bid with meals to supplement - will feel less tired when iron replete  Follow-up with Dr. Debby Bud ______

## 2013-02-06 ENCOUNTER — Ambulatory Visit (INDEPENDENT_AMBULATORY_CARE_PROVIDER_SITE_OTHER): Payer: Medicare HMO | Admitting: Internal Medicine

## 2013-02-06 ENCOUNTER — Encounter: Payer: Self-pay | Admitting: Internal Medicine

## 2013-02-06 VITALS — BP 168/100 | HR 63 | Ht 70.0 in | Wt 164.0 lb

## 2013-02-06 DIAGNOSIS — Z23 Encounter for immunization: Secondary | ICD-10-CM

## 2013-02-06 DIAGNOSIS — N401 Enlarged prostate with lower urinary tract symptoms: Secondary | ICD-10-CM

## 2013-02-06 DIAGNOSIS — M1710 Unilateral primary osteoarthritis, unspecified knee: Secondary | ICD-10-CM

## 2013-02-06 DIAGNOSIS — E785 Hyperlipidemia, unspecified: Secondary | ICD-10-CM

## 2013-02-06 DIAGNOSIS — Z0000 Encounter for general adult medical examination without abnormal findings: Secondary | ICD-10-CM

## 2013-02-06 DIAGNOSIS — M25561 Pain in right knee: Secondary | ICD-10-CM

## 2013-02-06 DIAGNOSIS — I10 Essential (primary) hypertension: Secondary | ICD-10-CM

## 2013-02-06 DIAGNOSIS — M25569 Pain in unspecified knee: Secondary | ICD-10-CM

## 2013-02-06 DIAGNOSIS — R252 Cramp and spasm: Secondary | ICD-10-CM

## 2013-02-06 DIAGNOSIS — M1711 Unilateral primary osteoarthritis, right knee: Secondary | ICD-10-CM

## 2013-02-06 MED ORDER — METHYLPREDNISOLONE ACETATE 80 MG/ML IJ SUSP
80.0000 mg | Freq: Once | INTRAMUSCULAR | Status: AC
  Administered 2013-02-06: 80 mg via INTRA_ARTICULAR

## 2013-02-06 MED ORDER — ALBUTEROL SULFATE HFA 108 (90 BASE) MCG/ACT IN AERS: 2.0000 | INHALATION_SPRAY | Freq: Four times a day (QID) | RESPIRATORY_TRACT | Status: DC | PRN

## 2013-02-06 NOTE — Progress Notes (Signed)
Subjective:    Patient ID: Henry Carter, male    DOB: 04-08-1941, 72 y.o.   MRN: [id number]  HPI The patient is here for annual Medicare wellness examination and management of other chronic and acute problems.  In the interval he has been seen by Dr. Leone Payor for GI - diagnosed with H. Pylori and treated. Since then he has put on some weight, has better energy.   He is having right knee pain and collapse with a couple of almost falls.    The risk factors are reflected in the social history.  The roster of all physicians providing medical care to patient - is listed in the Snapshot section of the chart.  Activities of daily living:  The patient is 100% inedpendent in all ADLs: dressing, toileting, feeding as well as independent mobility  Home safety : The patient has smoke detectors in the home. Falls - almost falls due to knee trouble. Has rails on the steps.  They wear seatbelts.  firearms are present in the home, kept in a safe fashion. There is no violence in the home.   There is no risks for hepatitis, STDs or HIV. There is no   history of blood transfusion. They have no travel history to infectious disease endemic areas of the world.  The patient has not seen their dentist in the last six month, has full dentures. Good fit. They have seen their eye doctor in the last year. They deny any hearing difficulty and have not had audiologic testing in the last year.    They do not  have excessive sun exposure. Discussed the need for sun protection: hats, long sleeves and use of sunscreen if there is significant sun exposure.   Diet: the importance of a healthy diet is discussed. They do have a unhealthy-high fat diet.  The patient has no regular exercise program.  The benefits of regular aerobic exercise were discussed.  Depression screen: there are no signs or vegative symptoms of depression- irritability, change in appetite, anhedonia, sadness/tearfullness.  Cognitive assessment: the  patient manages all their financial and personal affairs and is actively engaged.   The following portions of the patient's history were reviewed and updated as appropriate: allergies, current medications, past family history, past medical history,  past surgical history, past social history  and problem list.  Vision, hearing, body mass index were assessed and reviewed.   During the course of the visit the patient was educated and counseled about appropriate screening and preventive services including : fall prevention , diabetes screening, nutrition counseling, colorectal cancer screening, and recommended immunizations.  Past Medical History  Diagnosis Date  . Peripheral vascular disease, unspecified   . Angioedema   . Essential hypertension   . Hyperlipidemia   . Personal history of tobacco use, presenting hazards to health   . IBS (irritable bowel syndrome)   . BPH (benign prostatic hyperplasia)   . Anemia   . Impotence   . Personal history of colonic adenoma 09/22/2012  . Helicobacter pylori gastritis 10/10/2012  .  iron deficiency 01/18/2013   Past Surgical History  Procedure Laterality Date  . Abdominal surg for ulcers'    . Distal aortogram    . Percutaneous transluminal angionplasty with placement of 2 self expanding stent in the distal left superfical femeral artery    . Colonoscopy N/A 09/22/2012    Procedure: COLONOSCOPY;  Surgeon: Iva Boop, MD;  Location: [facility];  Service: Endoscopy;  Laterality: N/A;   Family History  Problem Relation Age of Onset  . Diabetes Mother   . Diabetes Father   . Diabetes Brother   . Colon cancer Neg Hx    History   Social History  . Marital Status: Married    Spouse Name: N/A    Number of Children: N/A  . Years of Education: N/A   Occupational History  . Not on file.   Social History Main Topics  . Smoking status: Former Games developer  . Smokeless tobacco: Never Used  . Alcohol Use: No     Comment: OCC  . Drug Use: No  .  Sexual Activity: Not on file   Other Topics Concern  . 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 aspirin 81 MG tablet Take 81 mg by mouth daily.        . CHANTIX 0.5 MG tablet Take 0.5 mg by mouth 2 (two) times daily.       . clopidogrel (PLAVIX) 75 MG tablet Take 1 tablet (75 mg total) by mouth daily.  90 tablet  3  . doxazosin (CARDURA) 4 MG tablet TAKE 1 TABLET BY MOUTH TWICE DAILY  180 tablet  1  . gabapentin (NEURONTIN) 300 MG capsule TAKE ONE CAPSULE BY MOUTH 3 TIMES A DAY  90 capsule  3  . loperamide (IMODIUM A-D) 2 MG tablet Take 1 tablet (2 mg total) by mouth 4 (four) times daily as needed for diarrhea or loose stools.  30 tablet  0  . esomeprazole (NEXIUM) 40 MG capsule Take 1 capsule (40 mg total) by mouth daily.  30 capsule  11   No current facility-administered medications on file prior to visit.      Review of Systems Constitutional:  Negative for fever, chills, activity change and unexpected weight change.  HEENT:  Negative for hearing loss, ear pain, congestion, neck stiffness and postnasal drip. Negative for sore throat or swallowing problems. Negative for dental complaints.   Eyes: Negative for vision loss or change in visual acuity.  Respiratory: Negative for chest tightness and wheezing. Negative for DOE.   Cardiovascular: Negative for chest pain or palpitations. No decreased exercise tolerance Gastrointestinal: No change in bowel habit. No bloating or gas. No reflux or indigestion Genitourinary: Negative for urgency, frequency, flank pain and difficulty urinating. Nocturia 1-2  Musculoskeletal: Negative for myalgias, back pain, arthralgias and gait problem, except for hand cramps and right knee pain/collapse  Neurological: Negative for dizziness,  tremors, weakness and headaches.  Hematological: Negative for adenopathy.  Psychiatric/Behavioral: Negative for behavioral problems and dysphoric mood.       Objective:   Physical Exam Filed Vitals:   02/06/13 1005  BP: 168/100  Pulse: 63   Wt Readings from Last 3 Encounters:  02/06/13 164 lb (74.39 kg)  01/17/13 163 lb 2 oz (73.993 kg)  10/20/12 162 lb (73.483 kg)   Gen'l: Well nourished well developed male in no acute distress  HEENT: Head: Normocephalic and atraumatic. Right Ear: External ear normal. EAC - soft cerumen/TM nl. Left Ear: External ear normal.  EAC w/ soft cerumen/TM nl. Nose: Nose normal. Mouth/Throat: Oropharynx is clear and moist. Dentition -edentulous with full dentures. No buccal or palatal lesions. Posterior pharynx clear. Eyes: Conjunctivae and sclera clear. EOM intact. Pupils  are equal, round, and reactive to light. Right eye exhibits no discharge. Left eye exhibits no discharge. Neck: Normal range of motion. Neck supple. No JVD present. No tracheal deviation present. No thyromegaly present.  Cardiovascular: Normal rate, regular rhythm, no gallop, no friction rub, no murmur heard.      Quiet precordium. 2+ radial and DP pulses . No carotid bruits Pulmonary/Chest: Effort normal. No respiratory distress or increased WOB, no wheezes, no rales. No chest wall deformity or CVAT. Abdomen: Soft. Bowel sounds are normal in all quadrants. He exhibits no distension, no tenderness, no rebound or guarding, No heptosplenomegaly  Genitourinary:  deferred Musculoskeletal: Normal range of motion. He exhibits no edema and no tenderness.       Small and large joints without redness, synovial thickening or deformity. Full range of motion preserved about all small, median and large joints. Right knee with loose knee cap but no crepitus or click. Mild tenderness with passive ROM Lymphadenopathy:    He has no cervical or supraclavicular adenopathy.  Neurological: He is alert and  oriented to person, place, and time. CN II-XII intact. DTRs 2+ and symmetrical biceps, radial and patellar tendons. Cerebellar function normal with no tremor, rigidity, normal gait and station.  Skin: Skin is warm and dry. No rash noted. No erythema.  Psychiatric: He has a normal mood and affect. His behavior is normal. Thought content normal.   Procedure Joint injection  Indication - localized pain right knee c/w DJD Consent - informed verbal consent from patient after explanation of risks of bleeding and infection Prep - injection site identified, prepped with betadine followed by alcohol. Med -  80 Mg depomedrol with 0.5 cc 2% xylocain Injection - joint space entered easily. Injected without difficulty. Patient tolerated this well. Post-procedure - patient with rapid reduction in discomfort. Bandaid applied. Routine precautions provided including instruction to return for fever, drainage or increased pain  Recent Results (from the past 2160 hour(s))  H. PYLORI ANTIGEN, STOOL     Status: None   Collection Time    12/25/12  1:02 PM      Result Value Range   H pylori Ag, Stl Negative  Negative  VITAMIN B12     Status: None   Collection Time    01/17/13 11:20 AM      Result Value Range   Vitamin B-12 394  211 - 911 pg/mL  FERRITIN     Status: Abnormal   Collection Time    01/17/13 11:20 AM      Result Value Range   Ferritin 16.0 (*) 22.0 - 322.0 ng/mL        Assessment & Plan:

## 2013-02-06 NOTE — Patient Instructions (Addendum)
Good to see you and I am glad that you are doing better.  Stomach - glad that you are doing better. Continue to take iron supplement.  Knee pain, right: this is most likely degenerative joint disease, aka wear and tear arthritis. The injection will hopefully give you many weeks of relief.   General health exam is good. For basic lab today and results will be on my chart.  Immunizations: will give first pneumonia vaccine today - Prevnar 13. You will need the companion pneumonia vaccine, pneumovax, in about 10 weeks.   Over all you seem to be doing well.

## 2013-02-07 ENCOUNTER — Encounter: Payer: Self-pay | Admitting: Internal Medicine

## 2013-02-07 ENCOUNTER — Other Ambulatory Visit: Payer: Self-pay | Admitting: Internal Medicine

## 2013-02-07 DIAGNOSIS — M1711 Unilateral primary osteoarthritis, right knee: Secondary | ICD-10-CM | POA: Insufficient documentation

## 2013-02-07 DIAGNOSIS — Z0000 Encounter for general adult medical examination without abnormal findings: Secondary | ICD-10-CM | POA: Insufficient documentation

## 2013-02-07 NOTE — Assessment & Plan Note (Signed)
Labs reviewed for previous 3 visits - normal range LDL. No indications for medication

## 2013-02-07 NOTE — Assessment & Plan Note (Signed)
Stable on Cardura

## 2013-02-07 NOTE — Assessment & Plan Note (Signed)
Interval hx - treated for H. Pylori - feels much better. Hgb is stable, B12 is normal. He feels his strength is better. Physical exam - unremarkable. Reviewed previous labs and no follow up lab at this time. He is current for colorectal cancer screening and has aged out of Prostate cancer screening. Immunizations are brought up to date. He is not smoking.

## 2013-02-07 NOTE — Assessment & Plan Note (Signed)
BP Readings from Last 3 Encounters:  02/06/13 168/100  01/17/13 160/86  10/20/12 140/80   BP is too high. Will ask patient to monitor at home and report back by MyChart-recommendations to follow.

## 2013-02-07 NOTE — Assessment & Plan Note (Signed)
knee pain with h/o collapse. Exam w/o effusion, crepitus, click.  Plan Intra-articular depomedrol injection done - patient reported good relief.

## 2013-02-07 NOTE — Assessment & Plan Note (Signed)
Reasonably good control of cramps using gabapentin. He can tell if he misses a dose.

## 2013-02-14 ENCOUNTER — Other Ambulatory Visit: Payer: Self-pay | Admitting: Cardiovascular Disease

## 2013-02-14 NOTE — Telephone Encounter (Signed)
Pt has not been seen here since July 2011. Prescription can be refilled for 90 days but pt must contact office for further refills.

## 2013-03-01 ENCOUNTER — Ambulatory Visit (INDEPENDENT_AMBULATORY_CARE_PROVIDER_SITE_OTHER): Payer: Medicare HMO | Admitting: Nurse Practitioner

## 2013-03-01 ENCOUNTER — Encounter: Payer: Self-pay | Admitting: Nurse Practitioner

## 2013-03-01 ENCOUNTER — Other Ambulatory Visit (INDEPENDENT_AMBULATORY_CARE_PROVIDER_SITE_OTHER): Payer: Medicare HMO

## 2013-03-01 VITALS — BP 130/80 | HR 76 | Ht 69.0 in | Wt 163.2 lb

## 2013-03-01 DIAGNOSIS — R195 Other fecal abnormalities: Secondary | ICD-10-CM

## 2013-03-01 LAB — CBC WITH DIFFERENTIAL/PLATELET
Basophils Absolute: 0 10*3/uL (ref 0.0–0.1)
Eosinophils Relative: 1.4 % (ref 0.0–5.0)
HCT: 41 % (ref 39.0–52.0)
Lymphocytes Relative: 24.6 % (ref 12.0–46.0)
Lymphs Abs: 1.2 10*3/uL (ref 0.7–4.0)
MCV: 95.9 fl (ref 78.0–100.0)
Monocytes Absolute: 0.4 10*3/uL (ref 0.1–1.0)
Monocytes Relative: 8.6 % (ref 3.0–12.0)
Neutrophils Relative %: 64.6 % (ref 43.0–77.0)
Platelets: 215 10*3/uL (ref 150.0–400.0)
RDW: 16.3 % — ABNORMAL HIGH (ref 11.5–14.6)
WBC: 5 10*3/uL (ref 4.5–10.5)

## 2013-03-01 LAB — BASIC METABOLIC PANEL
Calcium: 9.8 mg/dL (ref 8.4–10.5)
Chloride: 100 mEq/L (ref 96–112)
GFR: 87.45 mL/min (ref >60.00)
Glucose, Bld: 83 mg/dL (ref 70–99)
Potassium: 4.5 mEq/L (ref 3.5–5.1)
Sodium: 136 mEq/L (ref 135–145)

## 2013-03-01 NOTE — Progress Notes (Signed)
     History of Present Illness:   Patient is a 72 year old male seen in May 2014 by Dr. Leone Payor for a 1-2 month history of loose bowel movements and diarrhea. He was losing weight. Colonosocpy in June unremarkable except for a tiny left colon polyp. EGD in July revealed gastritis, gastric AVM, abnormal duodenum. Gastric biopsies positive for h.pylori. Duodenal biopsies negative. He was prescribed Pylera but only took it 6 days because of urinary burning. Discontinuation of the medication relieved the dysyria.  At his follow up visit in October patient was feeling better. Diarrhea had resolved, weight was up.   Patient started iron tablets two weeks ago.  Three days ago he began having black stools. Stools no more frequent than usual. No abdominal pain. No nausea. No unusual SOB.  Current Medications, Allergies, Past Medical History, Past Surgical History, Family History and Social History were reviewed in [employer] record.  Physical Exam: General: Pleasant, thin , black male in no acute distress Head: Normocephalic and atraumatic Eyes:  sclerae anicteric, conjunctiva pink  Ears: Normal auditory acuity Lungs: Clear throughout to auscultation Heart: Regular rate and rhythm Abdomen: Soft, non distended, non-tender. No masses, no hepatomegaly. Normal bowel sounds Rectal: black, heme + stool Musculoskeletal: Symmetrical with no gross deformities  Extremities: No edema  Neurological: Alert oriented x 4, grossly nonfocal Psychological:  Alert and cooperative. Normal mood and affect  Assessment and Recommendations: 1. Black, heme + stool on iron.  He did have a non-bleeding gastric AVM on EGD in July 2014. AVM may be bleeding on Plavix / asa but black stools may also be from recent initiation of iron though that doesn't explain + hemoccult.  Will check a CBC today. Hold iron until we can sort this out. Will call him with CBC results. If hgb declines and /or black stools  persist off iron then he will need EGD with possible AVM ablation.   2. H.Pylori gastritis on EGD. Patient only completed 6 days of pylera. Continue daily PPI   3. Chronic antiplatelet therapy (Plavix)

## 2013-03-01 NOTE — Patient Instructions (Addendum)
Your physician has requested that you go to the basement for the following lab work before leaving today: CBC BMET We will be called tomorrow with the results. Continue Nexium. Hold Iron for now.  CC:  Illene Regulus MD

## 2013-03-02 ENCOUNTER — Other Ambulatory Visit: Payer: Self-pay | Admitting: *Deleted

## 2013-03-02 DIAGNOSIS — K921 Melena: Secondary | ICD-10-CM

## 2013-03-02 NOTE — Progress Notes (Signed)
Agree with Ms. Guenther's assessment and plan. Iva Boop, MD, [facility]  We also need to consider checking for H. Pylori eradication. Lab Results  Component Value Date   WBC 5.0 03/01/2013   HGB 13.8 03/01/2013   HCT 41.0 03/01/2013   MCV 95.9 03/01/2013   PLT 215.0 03/01/2013   He can have a f/u  Gunnar Fusi (or me) in Dec/Jan

## 2013-03-06 ENCOUNTER — Encounter: Payer: Self-pay | Admitting: *Deleted

## 2013-03-06 ENCOUNTER — Telehealth: Payer: Self-pay | Admitting: *Deleted

## 2013-03-06 ENCOUNTER — Other Ambulatory Visit (INDEPENDENT_AMBULATORY_CARE_PROVIDER_SITE_OTHER): Payer: Medicare HMO

## 2013-03-06 DIAGNOSIS — K921 Melena: Secondary | ICD-10-CM

## 2013-03-06 LAB — CBC WITH DIFFERENTIAL/PLATELET
Basophils Relative: 0.5 % (ref 0.0–3.0)
Eosinophils Absolute: 0.1 10*3/uL (ref 0.0–0.7)
Eosinophils Relative: 2.7 % (ref 0.0–5.0)
HCT: 38.4 % — ABNORMAL LOW (ref 39.0–52.0)
Hemoglobin: 12.8 g/dL — ABNORMAL LOW (ref 13.0–17.0)
Lymphocytes Relative: 29.2 % (ref 12.0–46.0)
Lymphs Abs: 1.4 10*3/uL (ref 0.7–4.0)
MCHC: 33.4 g/dL (ref 30.0–36.0)
MCV: 98 fl (ref 78.0–100.0)
Monocytes Absolute: 0.5 10*3/uL (ref 0.1–1.0)
Monocytes Relative: 10.9 % (ref 3.0–12.0)
Neutro Abs: 2.6 10*3/uL (ref 1.4–7.7)
Platelets: 199 10*3/uL (ref 150.0–400.0)
RBC: 3.92 Mil/uL — ABNORMAL LOW (ref 4.22–5.81)

## 2013-03-06 NOTE — Telephone Encounter (Signed)
Meredith Pel, NP Daphine Deutscher, RN            Rene Kocher,  Can you check on patient. He should have repeat CBC sometime next week to make sure hgb holding. Also, can you get him in to see Dr. Leone Payor for sometime early December. Thanks            Patient is already scheduled for repeat labs this week. Scheduled with Willette Cluster, NP (no OV available with Dr. Leone Payor) on 03/20/13 at 9:00 AM. Letter mailed to patient with appointment.

## 2013-03-12 ENCOUNTER — Other Ambulatory Visit: Payer: Self-pay | Admitting: *Deleted

## 2013-03-12 DIAGNOSIS — K921 Melena: Secondary | ICD-10-CM

## 2013-03-14 ENCOUNTER — Other Ambulatory Visit (INDEPENDENT_AMBULATORY_CARE_PROVIDER_SITE_OTHER): Payer: Medicare HMO

## 2013-03-14 DIAGNOSIS — K921 Melena: Secondary | ICD-10-CM

## 2013-03-14 LAB — CBC WITH DIFFERENTIAL/PLATELET
Basophils Absolute: 0 10*3/uL (ref 0.0–0.1)
Eosinophils Relative: 1.7 % (ref 0.0–5.0)
HCT: 42 % (ref 39.0–52.0)
Hemoglobin: 14.1 g/dL (ref 13.0–17.0)
Lymphocytes Relative: 32.6 % (ref 12.0–46.0)
Lymphs Abs: 1.5 10*3/uL (ref 0.7–4.0)
MCHC: 33.5 g/dL (ref 30.0–36.0)
MCV: 96.6 fl (ref 78.0–100.0)
Monocytes Absolute: 0.5 10*3/uL (ref 0.1–1.0)
Monocytes Relative: 11.1 % (ref 3.0–12.0)
Neutro Abs: 2.6 10*3/uL (ref 1.4–7.7)
Neutrophils Relative %: 54.1 % (ref 43.0–77.0)
Platelets: 190 10*3/uL (ref 150.0–400.0)
RDW: 16 % — ABNORMAL HIGH (ref 11.5–14.6)

## 2013-03-19 ENCOUNTER — Other Ambulatory Visit: Payer: Self-pay

## 2013-03-19 MED ORDER — ESOMEPRAZOLE MAGNESIUM 40 MG PO CPDR: 40.0000 mg | DELAYED_RELEASE_CAPSULE | Freq: Every day | ORAL | Status: DC

## 2013-03-19 NOTE — Telephone Encounter (Signed)
Nexium is not on current med list. Should patient be on this?

## 2013-03-20 ENCOUNTER — Ambulatory Visit: Payer: Commercial Managed Care - HMO | Admitting: Nurse Practitioner

## 2013-04-11 ENCOUNTER — Telehealth: Payer: Self-pay

## 2013-04-11 NOTE — Telephone Encounter (Signed)
Patient has appointment with Dr. Leone Payor on 05/01/13, please do the new paper referral required.  Thank you.

## 2013-04-17 ENCOUNTER — Ambulatory Visit (INDEPENDENT_AMBULATORY_CARE_PROVIDER_SITE_OTHER): Payer: Medicare HMO

## 2013-04-17 DIAGNOSIS — Z23 Encounter for immunization: Secondary | ICD-10-CM

## 2013-04-24 ENCOUNTER — Other Ambulatory Visit: Payer: Self-pay | Admitting: Internal Medicine

## 2013-04-25 NOTE — Telephone Encounter (Signed)
Faxed to [facility] @ [phone], awaiting approval.

## 2013-04-25 NOTE — Telephone Encounter (Signed)
As of today, there is still no referral from PCP to GI at [facility].

## 2013-04-30 NOTE — Telephone Encounter (Signed)
Have auth. From 05/01/13-07/30/13 for 4 visits, #[id number].

## 2013-05-01 ENCOUNTER — Encounter: Payer: Self-pay | Admitting: Internal Medicine

## 2013-05-01 ENCOUNTER — Other Ambulatory Visit: Payer: Commercial Managed Care - HMO

## 2013-05-01 ENCOUNTER — Ambulatory Visit (INDEPENDENT_AMBULATORY_CARE_PROVIDER_SITE_OTHER): Payer: Medicare HMO | Admitting: Internal Medicine

## 2013-05-01 VITALS — BP 100/80 | HR 80 | Ht 69.0 in | Wt 165.6 lb

## 2013-05-01 DIAGNOSIS — B9681 Helicobacter pylori [H. pylori] as the cause of diseases classified elsewhere: Secondary | ICD-10-CM | POA: Insufficient documentation

## 2013-05-01 DIAGNOSIS — A048 Other specified bacterial intestinal infections: Secondary | ICD-10-CM

## 2013-05-01 DIAGNOSIS — Z7902 Long term (current) use of antithrombotics/antiplatelets: Secondary | ICD-10-CM

## 2013-05-01 DIAGNOSIS — K31819 Angiodysplasia of stomach and duodenum without bleeding: Secondary | ICD-10-CM | POA: Insufficient documentation

## 2013-05-01 DIAGNOSIS — K2970 Gastritis, unspecified, without bleeding: Secondary | ICD-10-CM

## 2013-05-01 DIAGNOSIS — D500 Iron deficiency anemia secondary to blood loss (chronic): Secondary | ICD-10-CM

## 2013-05-01 DIAGNOSIS — Q2733 Arteriovenous malformation of digestive system vessel: Secondary | ICD-10-CM

## 2013-05-01 DIAGNOSIS — K2940 Chronic atrophic gastritis without bleeding: Secondary | ICD-10-CM

## 2013-05-01 NOTE — Assessment & Plan Note (Signed)
At this point hold off on ablation per my discussion with him he has chosen conservative treatment understanding that this could hemorrhage at some point.

## 2013-05-01 NOTE — Patient Instructions (Addendum)
Hold your Nexium until we tell you to re-start.  After 2 weeks of holding the Nexium collect stool for the H. Pylori test.  Re-start your iron now and in 2 months (March 20th) come to our basement lab and have blood drawn, no appointment needed.  The lab hours are 7:30am-5:30pm.   I appreciate the opportunity to care for you.

## 2013-05-01 NOTE — Assessment & Plan Note (Signed)
Symptomatically improved after 6 days of treatment was stopped due to dysuria from Pylera. Given his overall situation we're going to check for eradication at this point after he holds his Nexium for 2 weeks. Stool antigen has been ordered.

## 2013-05-01 NOTE — Assessment & Plan Note (Addendum)
I'm assuming it this point that he has been losing blood chronically from the gastric AVM and possibly other AVMs. I discussed the possibility of holding his clopidogrel and ablating the gastric AVM and doing enteroscopy and looking for more distal AVMs. He prefers to conservative approach by taking his iron, and following up with a CBC and ferritin in 2 months. H. pylori gastritis might have contributed to iron deficiency and blood loss as well. I will check for eradication of H. pylori. He relates that he has not been taking his Nexium daily, and I advised him that since he is on clopidogrel and aspirin it would be appropriate to take a daily PPI we will sort that out after the H. pylori testing. I've explained that he could have hemorrhage in the future though unlikely be life-threatening, do so he knows that it could be more problematic than it has been with the bleeding from the AVM that I presume is ongoing but occult and slow.

## 2013-05-01 NOTE — Progress Notes (Signed)
    Subjective:    Patient ID: Henry Carter, male    DOB: [birth year], 73 y.o.   MRN: [id number]  HPI The patient is here after he noticed dark stools, and I began iron for low ferritin. He was found to be Hemoccult-positive at that time. His hemoglobin is now normal and has not been very low. The dark stools resolved when he stopped his iron is to off his iron. He tells me he has not been taking his Nexium but as needed for gas pressure pains. He remains on clopidogrel and aspirin. His diarrhea and weight loss problems resolved after I treated him for H. pylori last year. He was seen by our nurse practitioner in December for the problems mentioned above with the change in stools. Medications, allergies, past medical history, past surgical history, family history and social history are reviewed and updated in the EMR.   Review of Systems As above    Objective:   Physical Exam Thin but well-developed well-nourished elderly black man in no acute distress    Assessment & Plan:   1. Iron deficiency anemia secondary to blood loss (chronic)   2. Gastric AVM   3. Helicobacter pylori gastritis   4. Long term current use of antithrombotics/antiplatelets - clopidogrel    I appreciate the opportunity to care for this patient. CC: Adella Hare, MD

## 2013-05-15 ENCOUNTER — Other Ambulatory Visit: Payer: Commercial Managed Care - HMO

## 2013-05-15 DIAGNOSIS — K2970 Gastritis, unspecified, without bleeding: Principal | ICD-10-CM

## 2013-05-15 DIAGNOSIS — B9681 Helicobacter pylori [H. pylori] as the cause of diseases classified elsewhere: Secondary | ICD-10-CM

## 2013-05-17 LAB — H. PYLORI ANTIGEN, STOOL: H pylori Ag, Stl: NEGATIVE

## 2013-05-17 NOTE — Progress Notes (Signed)
Quick Note:  H. Pylori infection is gone No new recommendations ______

## 2013-05-21 ENCOUNTER — Other Ambulatory Visit: Payer: Self-pay | Admitting: Cardiovascular Disease

## 2013-06-25 ENCOUNTER — Telehealth: Payer: Self-pay

## 2013-06-25 NOTE — Telephone Encounter (Signed)
Spoke with patient and reminded him to come get labs, he verbalized understanding.

## 2013-06-25 NOTE — Telephone Encounter (Signed)
Message copied by [place], Renard Caperton E on Mon Jun 25, 2013  8:58 AM ------      Message from: [place], Alliene Klugh E      Created: Tue May 01, 2013 10:27 AM       Call and remind patient to have CBC, Ferritin done around late March. ------

## 2013-06-26 ENCOUNTER — Encounter: Payer: Self-pay | Admitting: Internal Medicine

## 2013-06-26 ENCOUNTER — Other Ambulatory Visit (INDEPENDENT_AMBULATORY_CARE_PROVIDER_SITE_OTHER): Payer: Commercial Managed Care - HMO

## 2013-06-26 DIAGNOSIS — D500 Iron deficiency anemia secondary to blood loss (chronic): Secondary | ICD-10-CM

## 2013-06-26 LAB — FERRITIN: FERRITIN: 51.1 ng/mL (ref 22.0–322.0)

## 2013-06-26 LAB — CBC WITH DIFFERENTIAL/PLATELET
Basophils Absolute: 0 10*3/uL (ref 0.0–0.1)
Basophils Relative: 0.4 % (ref 0.0–3.0)
EOS ABS: 0.2 10*3/uL (ref 0.0–0.7)
Eosinophils Relative: 2.4 % (ref 0.0–5.0)
HEMATOCRIT: 41 % (ref 39.0–52.0)
Hemoglobin: 13.6 g/dL (ref 13.0–17.0)
Lymphocytes Relative: 17.4 % (ref 12.0–46.0)
Lymphs Abs: 1.1 10*3/uL (ref 0.7–4.0)
MCHC: 33.2 g/dL (ref 30.0–36.0)
MCV: 100.8 fl — ABNORMAL HIGH (ref 78.0–100.0)
MONO ABS: 0.7 10*3/uL (ref 0.1–1.0)
Monocytes Relative: 10.8 % (ref 3.0–12.0)
Neutro Abs: 4.5 10*3/uL (ref 1.4–7.7)
Neutrophils Relative %: 69 % (ref 43.0–77.0)
PLATELETS: 216 10*3/uL (ref 150.0–400.0)
RBC: 4.07 Mil/uL — ABNORMAL LOW (ref 4.22–5.81)
RDW: 14.8 % — AB (ref 11.5–14.6)
WBC: 6.5 10*3/uL (ref 4.5–10.5)

## 2013-06-26 NOTE — Progress Notes (Signed)
Quick Note:  Notified by My Chart Needs CBC July 1 (or so) I told him that in My Chart ______

## 2013-06-27 ENCOUNTER — Telehealth: Payer: Self-pay | Admitting: Internal Medicine

## 2013-06-27 ENCOUNTER — Other Ambulatory Visit: Payer: Self-pay

## 2013-06-27 ENCOUNTER — Other Ambulatory Visit: Payer: Self-pay | Admitting: Cardiovascular Disease

## 2013-06-27 DIAGNOSIS — D500 Iron deficiency anemia secondary to blood loss (chronic): Secondary | ICD-10-CM

## 2013-06-27 NOTE — Telephone Encounter (Signed)
Patient notified of labs - see message from Dr. Carlean Purl on [place]

## 2013-07-03 ENCOUNTER — Ambulatory Visit (INDEPENDENT_AMBULATORY_CARE_PROVIDER_SITE_OTHER): Payer: Medicare HMO | Admitting: Internal Medicine

## 2013-07-03 ENCOUNTER — Encounter: Payer: Self-pay | Admitting: Internal Medicine

## 2013-07-03 VITALS — BP 122/80 | HR 81 | Temp 98.3°F | Wt 165.5 lb

## 2013-07-03 DIAGNOSIS — R42 Dizziness and giddiness: Secondary | ICD-10-CM | POA: Insufficient documentation

## 2013-07-03 DIAGNOSIS — R269 Unspecified abnormalities of gait and mobility: Secondary | ICD-10-CM

## 2013-07-03 DIAGNOSIS — H9311 Tinnitus, right ear: Secondary | ICD-10-CM

## 2013-07-03 DIAGNOSIS — H9319 Tinnitus, unspecified ear: Secondary | ICD-10-CM

## 2013-07-03 MED ORDER — CLOPIDOGREL BISULFATE 75 MG PO TABS: ORAL_TABLET | ORAL | Status: DC

## 2013-07-03 NOTE — Progress Notes (Signed)
Pre visit review using our clinic review tool, if applicable. No additional management support is needed unless otherwise documented below in the visit note. 

## 2013-07-03 NOTE — Assessment & Plan Note (Signed)
March '15 - new onset in Jan '15. Ear exam is normal  Plan Audiology evaluation

## 2013-07-03 NOTE — Assessment & Plan Note (Signed)
New onset dysequilibrium along with tinnitus. Concern for acoustic neuroma vs Cerebellar injury  Plan MRI brain w/o contrast.

## 2013-07-03 NOTE — Progress Notes (Signed)
Subjective:    Patient ID: Henry Carter, male    DOB: 02-28-1941, 73 y.o.   MRN: [id number]  HPI Henry Carter presents for a several month history of tinnitus and w/o hearing loss. He has also had a problem with staggering which is worse in the AM. He has had several bad headaches across the right forehead to the temporal region. No double vision. No change in strength.  He did see Dr. Carlean Purl Jan 20th, he is to have repeat EGD in July '15.   Past Medical History  Diagnosis Date  . Peripheral vascular disease, unspecified   . Angioedema   . Essential hypertension   . Hyperlipidemia   . Personal history of tobacco use, presenting hazards to health   . IBS (irritable bowel syndrome)   . BPH (benign prostatic hyperplasia)   . Anemia   . Impotence   . Personal history of colonic adenoma 09/22/2012  . Helicobacter pylori gastritis 10/10/2012  .  iron deficiency 01/18/2013   Past Surgical History  Procedure Laterality Date  . Abdominal surg for ulcers'    . Distal aortogram    . Percutaneous transluminal angionplasty with placement of 2 self expanding stent in the distal left superfical femeral artery    . Colonoscopy N/A 09/22/2012    Procedure: COLONOSCOPY;  Surgeon: Gatha Mayer, MD;  Location: [facility];  Service: Endoscopy;  Laterality: N/A;   Family History  Problem Relation Age of Onset  . Diabetes Mother   . Diabetes Father   . Diabetes Brother   . Colon cancer Neg Hx    History   Social History  . Marital Status: Married    Spouse Name: N/A    Number of Children: N/A  . Years of Education: N/A   Occupational History  . Not on file.   Social History Main Topics  . Smoking status: Former Research scientist (life sciences)  . Smokeless tobacco: Never Used  . Alcohol Use: No     Comment: OCC  . Drug Use: No  . Sexual Activity: Not on file   Other Topics Concern  . 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 albuterol (PROVENTIL HFA;VENTOLIN HFA) 108 (90 BASE) MCG/ACT inhaler Inhale 2 puffs into the lungs every 6 (six) hours as needed for wheezing.  1 Inhaler  3  . aspirin 81 MG tablet Take 81 mg by mouth daily.        . CHANTIX 0.5 MG tablet Take 0.5 mg by mouth 2 (two) times daily.       Marland Kitchen doxazosin (CARDURA) 4 MG tablet TAKE 1 TABLET BY MOUTH TWICE DAILY  180 tablet  1  . esomeprazole (NEXIUM) 40 MG capsule Take 1 capsule (40 mg total) by mouth daily.  30 capsule  5  . ferrous sulfate 325 (65 FE) MG tablet Take 325 mg by mouth daily.      Marland Kitchen gabapentin (NEURONTIN) 300 MG capsule TAKE 1 CAPSULE BY MOUTH THREE TIMES DAILY  270 capsule  1  . loperamide (IMODIUM A-D) 2 MG tablet Take 1 tablet (2 mg total) by mouth 4 (four) times daily as needed for diarrhea or loose stools.  30 tablet  0   No current facility-administered medications on file prior to visit.  Review of Systems System review is negative for any constitutional, cardiac, pulmonary, GI or neuro symptoms or complaints other than as described in the HPI.     Objective:   Physical Exam Filed Vitals:   07/03/13 1520  BP: 122/80  Pulse: 81  Temp: 98.3 F (36.8 C)   Wt Readings from Last 3 Encounters:  07/03/13 165 lb 8 oz (75.07 kg)  05/01/13 165 lb 9.6 oz (75.116 kg)  03/01/13 163 lb 4 oz (74.05 kg)   BP Readings from Last 3 Encounters:  07/03/13 122/80  05/01/13 100/80  03/01/13 130/80   gen'l- slender man in no distress HEENT - EACs, after irrigation, clear, TMs normal. Cor - RRR Neuro - normal gait at exam, no tremor, hearing - normal Rhinne test.       Assessment & Plan:  Tinnitus - new onset along with new headaches. Exam is normal but there is a concern for acoustic neuroma vs. Other.  Plan Referral to audiology for full evaluation  MRI brain to r/o acoustic  neuroma or other mass occupying lesion.

## 2013-07-24 ENCOUNTER — Other Ambulatory Visit: Payer: Self-pay | Admitting: Internal Medicine

## 2013-07-24 DIAGNOSIS — R269 Unspecified abnormalities of gait and mobility: Secondary | ICD-10-CM

## 2013-07-24 DIAGNOSIS — H9311 Tinnitus, right ear: Secondary | ICD-10-CM

## 2013-07-25 ENCOUNTER — Other Ambulatory Visit: Payer: Self-pay | Admitting: Internal Medicine

## 2013-08-01 ENCOUNTER — Other Ambulatory Visit: Payer: Medicare HMO

## 2013-08-01 ENCOUNTER — Inpatient Hospital Stay
Admission: RE | Admit: 2013-08-01 | Discharge: 2013-08-01 | Disposition: A | Discharge status: Home / Self Care | Payer: Medicare HMO | Source: Ambulatory Visit | Attending: Internal Medicine | Admitting: Internal Medicine

## 2013-08-07 ENCOUNTER — Other Ambulatory Visit: Payer: Medicare HMO

## 2013-08-07 ENCOUNTER — Ambulatory Visit
Admission: RE | Admit: 2013-08-07 | Discharge: 2013-08-07 | Disposition: A | Discharge status: Home / Self Care | Payer: Commercial Managed Care - HMO | Source: Ambulatory Visit | Attending: Internal Medicine | Admitting: Internal Medicine

## 2013-08-07 DIAGNOSIS — R269 Unspecified abnormalities of gait and mobility: Secondary | ICD-10-CM

## 2013-08-07 DIAGNOSIS — H9311 Tinnitus, right ear: Secondary | ICD-10-CM

## 2013-08-14 ENCOUNTER — Other Ambulatory Visit: Payer: Self-pay

## 2013-08-14 MED ORDER — DOXAZOSIN MESYLATE 4 MG PO TABS: ORAL_TABLET | ORAL | Status: DC

## 2013-08-15 ENCOUNTER — Encounter: Payer: Self-pay | Admitting: Internal Medicine

## 2013-10-11 ENCOUNTER — Telehealth: Payer: Self-pay

## 2013-10-11 NOTE — Telephone Encounter (Signed)
Message copied by Greggory Keen on Thu Oct 11, 2013  1:49 PM ------      Message from: Marlon Pel      Created: Wed Jun 27, 2013 12:53 PM       Needs CBC- Carlean Purl see 3/18 ------

## 2013-10-11 NOTE — Telephone Encounter (Signed)
Left message to call back on the patient's mobile number

## 2013-10-16 NOTE — Telephone Encounter (Signed)
Spoke with the patient-states he will come this week for his CBC

## 2013-10-18 ENCOUNTER — Other Ambulatory Visit (INDEPENDENT_AMBULATORY_CARE_PROVIDER_SITE_OTHER): Payer: Medicare HMO

## 2013-10-18 DIAGNOSIS — D500 Iron deficiency anemia secondary to blood loss (chronic): Secondary | ICD-10-CM

## 2013-10-18 LAB — CBC WITH DIFFERENTIAL/PLATELET
BASOS ABS: 0 10*3/uL (ref 0.0–0.1)
Basophils Relative: 0.5 % (ref 0.0–3.0)
EOS ABS: 0.2 10*3/uL (ref 0.0–0.7)
Eosinophils Relative: 3.6 % (ref 0.0–5.0)
HCT: 43 % (ref 39.0–52.0)
HEMOGLOBIN: 14.2 g/dL (ref 13.0–17.0)
Lymphocytes Relative: 28.4 % (ref 12.0–46.0)
Lymphs Abs: 1.5 10*3/uL (ref 0.7–4.0)
MCHC: 33.1 g/dL (ref 30.0–36.0)
MCV: 99.1 fl (ref 78.0–100.0)
MONOS PCT: 7.8 % (ref 3.0–12.0)
Monocytes Absolute: 0.4 10*3/uL (ref 0.1–1.0)
NEUTROS ABS: 3.2 10*3/uL (ref 1.4–7.7)
Neutrophils Relative %: 59.7 % (ref 43.0–77.0)
Platelets: 212 10*3/uL (ref 150.0–400.0)
RBC: 4.34 Mil/uL (ref 4.22–5.81)
RDW: 15.1 % (ref 11.5–15.5)
WBC: 5.3 10*3/uL (ref 4.0–10.5)

## 2013-10-19 ENCOUNTER — Other Ambulatory Visit: Payer: Self-pay

## 2013-10-19 DIAGNOSIS — D649 Anemia, unspecified: Secondary | ICD-10-CM

## 2013-10-19 NOTE — Progress Notes (Signed)
Quick Note:  CBC ok Needs to stay on iron and have another CBC in early Nov I am notifying through My Chart ______

## 2013-11-20 ENCOUNTER — Other Ambulatory Visit: Payer: Self-pay

## 2013-11-20 MED ORDER — DOXAZOSIN MESYLATE 4 MG PO TABS: ORAL_TABLET | ORAL | Status: DC

## 2013-11-26 ENCOUNTER — Ambulatory Visit (INDEPENDENT_AMBULATORY_CARE_PROVIDER_SITE_OTHER): Payer: Medicare HMO | Admitting: Medical

## 2013-11-26 ENCOUNTER — Encounter: Payer: Self-pay | Admitting: Medical

## 2013-11-26 VITALS — BP 151/84 | HR 60 | Temp 98.0°F | Ht 70.0 in | Wt 163.0 lb

## 2013-11-26 DIAGNOSIS — R42 Dizziness and giddiness: Secondary | ICD-10-CM

## 2013-11-26 DIAGNOSIS — G911 Obstructive hydrocephalus: Secondary | ICD-10-CM

## 2013-11-26 DIAGNOSIS — R609 Edema, unspecified: Secondary | ICD-10-CM

## 2013-11-26 DIAGNOSIS — E785 Hyperlipidemia, unspecified: Secondary | ICD-10-CM

## 2013-11-26 DIAGNOSIS — R600 Localized edema: Secondary | ICD-10-CM | POA: Insufficient documentation

## 2013-11-26 DIAGNOSIS — G919 Hydrocephalus, unspecified: Secondary | ICD-10-CM

## 2013-11-26 DIAGNOSIS — D500 Iron deficiency anemia secondary to blood loss (chronic): Secondary | ICD-10-CM

## 2013-11-26 DIAGNOSIS — J449 Chronic obstructive pulmonary disease, unspecified: Secondary | ICD-10-CM

## 2013-11-26 DIAGNOSIS — J4489 Other specified chronic obstructive pulmonary disease: Secondary | ICD-10-CM

## 2013-11-26 DIAGNOSIS — I10 Essential (primary) hypertension: Secondary | ICD-10-CM

## 2013-11-26 MED ORDER — BECLOMETHASONE DIPROPIONATE 40 MCG/ACT IN AERS: 2.0000 | INHALATION_SPRAY | Freq: Two times a day (BID) | RESPIRATORY_TRACT | Status: DC

## 2013-11-26 MED ORDER — ALBUTEROL SULFATE HFA 108 (90 BASE) MCG/ACT IN AERS: 2.0000 | INHALATION_SPRAY | Freq: Four times a day (QID) | RESPIRATORY_TRACT | Status: DC | PRN

## 2013-11-26 MED ORDER — MECLIZINE HCL 12.5 MG PO TABS: 12.5000 mg | ORAL_TABLET | Freq: Three times a day (TID) | ORAL | Status: DC | PRN

## 2013-11-26 NOTE — Assessment & Plan Note (Signed)
Has history of but hydrocephalus on mri in spring. 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 During interim will rx meclizine. If severe neurologic type signs and symptoms as discussed before neuro then ED.

## 2013-11-26 NOTE — Assessment & Plan Note (Signed)
His blood work one month ago showed stable hb/hct.

## 2013-11-26 NOTE — Assessment & Plan Note (Signed)
Pt is not any bp specific med that I  can see but on cardura and has bph. He will start to check his bp and document readings. May add low dose ace inhibitor if his bp is about same level on average as is today. Follow up in 2 wks. Bring log book in.

## 2013-11-26 NOTE — Assessment & Plan Note (Signed)
Mild elevated in the past and will recheck lipid panel today. He is fasting.

## 2013-11-26 NOTE — Assessment & Plan Note (Signed)
Minimal and faint now. Barely noticeable. I advised check weight every other day. If worsens and weight gain then cxr and bph.

## 2013-11-26 NOTE — Progress Notes (Signed)
   Subjective:    Patient ID: Henry Carter, male    DOB: 09/05/1940, 73 y.o.   MRN: [id number]  HPI  Pt in to get  Established. Prior MD Dr. Veverly Fells. He retired. Pt has various problems.   Dizziness for about one year(No recent acute severe change). He thinks it is for about one year. His MRI was negative except for mild hydrocephalus and small vessel disease. Pt states his gate is slower. His balance is off at times.  Pt does have history of bph. As long as he takes bph med he urinates ok.  Pt copd he states some problems breathing about 2 wks ago. He will catch himself wheezing at time.  Pt lipid not check since 2013. Not real restrictive diet. He was previously taken off of cholesterol med. He was told did not need med and no side effects.  Diagnosis of hypertension in chart. No headache and no chest pain. Pt on cardura. I got 150/80.  Anemia. Hx of iron deficiency. Last check in July. Levels ok then. Last done in 2014 and pt state normal.  Some mild ankle swelling of ankles. No change in his dysnpnea.      Review of Systems  Constitutional: Negative for fever, chills and fatigue.  HENT: Negative for congestion, ear discharge, ear pain, nosebleeds, postnasal drip, rhinorrhea, sneezing, tinnitus and trouble swallowing.   Eyes: Negative for photophobia, pain and visual disturbance.  Respiratory: Positive for shortness of breath and wheezing. Negative for cough, choking and chest tightness.        Daily mild sob. But with humid weather will wheeze. Last time was 2 wks.  Cardiovascular: Positive for leg swelling. Negative for chest pain and palpitations.       He thinks ankles have mild swelling. But no weight gain.  Endocrine: Negative for polydipsia, polyphagia and polyuria.  Genitourinary: Negative.        As long as he takes cardura he states urinates well.  Musculoskeletal: Negative.   Skin: Negative for color change, pallor, rash and wound.  Neurological: Positive for  dizziness. Negative for syncope, speech difficulty, weakness, numbness and headaches.       On and off for one year. Slight worse since mri. But no acute worsening and on other associated signs or symptoms.  Hematological: Negative for adenopathy. Does not bruise/bleed easily.  Psychiatric/Behavioral: Negative for suicidal ideas, hallucinations, behavioral problems, confusion, self-injury and agitation. The patient is not nervous/anxious.        Objective:   Physical Exam  General Mental Status- Alert. General Appearance- Not in acute distress.   Skin General: Color- Normal Color. Moisture- Normal Moisture.  Neck Carotid Arteries- Normal color. Moisture- Normal Moisture. No carotid bruits. No JVD.  Chest and Lung Exam Auscultation: Breath Sounds:-Normal,even and unlabored.  Cardiovascular Auscultation:Rythm- Regular. Murmurs & Other Heart Sounds:Auscultation of the heart reveals- No Murmurs.  Abdomen Inspection:-Inspeection Normal. Palpation/Percussion:Note:No mass. Palpation and Percussion of the abdomen reveal- Non Tender, Non Distended + BS, no rebound or guarding.    Neurologic Cranial Nerve exam:- CN III-XII intact(No nystagmus), symmetric smile. Drift Test:- No drift. Romberg Exam:- Negative.  Heal to Toe Gait exam:-Normal. Finger to Nose:- Normal/Intact Strength:- 5/5 equal and symmetric strength both upper and lower extremities. Heal to to gait intact.  Lower Extremities Bilateral very faint 1+   Edema around ankle, negative homans sign. No pretibial edema.    Assessment & Plan:

## 2013-11-26 NOTE — Assessment & Plan Note (Signed)
Will refill his albuterol today and made qvar available. To use qvar daily. Most of time he gets excacerbation during humid hot weather.

## 2013-11-26 NOTE — Patient Instructions (Signed)
For your on and off dizziness with possible mild hydrocephalus on MRI in spring will go ahead and refer you to Neurologist. For you worsening occasional copd/wheezing continue albuterol but will send qvar to your pharmacy. Will check your labs(lipid, kidney function and liver today). I want you to get bp cuff and check bp at least every other day. If bp like today or higher then will add bp med. Your anemia one month ago was stable continue iron. For you lower ext edema. Weigh yourself and if true edema or if any worsening shortness of breath the get cxr and special labs for Congestive heart failure. Follow up in 2 wks or as needed.

## 2013-11-27 LAB — COMPREHENSIVE METABOLIC PANEL
ALK PHOS: 107 U/L (ref 39–117)
ALT: 10 U/L (ref 0–53)
AST: 25 U/L (ref 0–37)
Albumin: 3.7 g/dL (ref 3.5–5.2)
BUN: 10 mg/dL (ref 6–23)
CO2: 27 mEq/L (ref 19–32)
Calcium: 9.5 mg/dL (ref 8.4–10.5)
Chloride: 104 mEq/L (ref 96–112)
Creatinine, Ser: 1.1 mg/dL (ref 0.4–1.5)
GFR: 86.33 mL/min (ref >60.00)
Glucose, Bld: 67 mg/dL — ABNORMAL LOW (ref 70–99)
POTASSIUM: 4.5 meq/L (ref 3.5–5.1)
SODIUM: 139 meq/L (ref 135–145)
TOTAL PROTEIN: 6.8 g/dL (ref 6.0–8.3)
Total Bilirubin: 0.6 mg/dL (ref 0.2–1.2)

## 2013-11-27 LAB — LIPID PANEL
Cholesterol: 183 mg/dL (ref 0–200)
HDL: 60 mg/dL (ref >39.00)
LDL CALC: 109 mg/dL — AB (ref 0–99)
NONHDL: 123
Total CHOL/HDL Ratio: 3
Triglycerides: 69 mg/dL (ref 0.0–149.0)
VLDL: 13.8 mg/dL (ref 0.0–40.0)

## 2013-12-10 ENCOUNTER — Ambulatory Visit: Payer: Medicare HMO | Admitting: Medical

## 2013-12-10 ENCOUNTER — Telehealth: Payer: Self-pay | Admitting: Medical

## 2013-12-10 DIAGNOSIS — Z0289 Encounter for other administrative examinations: Secondary | ICD-10-CM

## 2013-12-10 NOTE — Telephone Encounter (Signed)
PT DID NOT COME FOR FOLLOW UP

## 2013-12-20 ENCOUNTER — Encounter: Payer: Self-pay | Admitting: Neurology

## 2013-12-20 ENCOUNTER — Ambulatory Visit (INDEPENDENT_AMBULATORY_CARE_PROVIDER_SITE_OTHER): Payer: Medicare HMO | Admitting: Neurology

## 2013-12-20 ENCOUNTER — Other Ambulatory Visit: Payer: Self-pay | Admitting: *Deleted

## 2013-12-20 VITALS — BP 150/84 | HR 72 | Ht 68.0 in | Wt 163.6 lb

## 2013-12-20 DIAGNOSIS — G911 Obstructive hydrocephalus: Secondary | ICD-10-CM

## 2013-12-20 DIAGNOSIS — R2681 Unsteadiness on feet: Secondary | ICD-10-CM

## 2013-12-20 DIAGNOSIS — R413 Other amnesia: Secondary | ICD-10-CM

## 2013-12-20 DIAGNOSIS — R269 Unspecified abnormalities of gait and mobility: Secondary | ICD-10-CM

## 2013-12-20 DIAGNOSIS — I739 Peripheral vascular disease, unspecified: Secondary | ICD-10-CM

## 2013-12-20 DIAGNOSIS — Z48812 Encounter for surgical aftercare following surgery on the circulatory system: Secondary | ICD-10-CM

## 2013-12-20 DIAGNOSIS — G919 Hydrocephalus, unspecified: Secondary | ICD-10-CM

## 2013-12-20 NOTE — Progress Notes (Signed)
NEUROLOGY CONSULTATION NOTE  Jeriah Corkum MRN: [id number] DOB: 1941-02-21  Referring provider: Mackie Pai, PA-C Primary care provider: Mackie Pai, PA-C  Reason for consult:  dizziness  Thank you for your kind referral of Keith Felten for consultation of the above symptoms. Although his history is well known to you, please allow me to reiterate it for the purpose of our medical record. The patient was accompanied to the clinic by his wife who also provides collateral information. Records and images were personally reviewed where available.   HISTORY OF PRESENT ILLNESS: This is a pleasant 73 year old right-handed man with a history of hypertension, hyperlipidemia, peripheral vascular disease s/p bilateral stent in LE, BPH, presenting for gait instability that he describes as "staggering."  He had initially reported these symptoms to his previous PCP [name] in March 2015, where he reported staggering worse in the morning, balance being off, and several months of tinnitus without hearing loss.  I reviewed MRI brain without contrast done at that time which did not show any acute changes, however note of ventricular prominence slightly out of proportion to sulcal prominence, mild hydrocephalus could not be completely excluded. Aqueduct noted to be patent.  He and his wife report that at that time, staggering was not as bad, however over the past month, his wife has noticed a significant difference.  He would walk down the hallway and go against the wall, close to falling but always catching himself.  His wife describes him as appearing like he is "under the influence of something."  His speech has been affected as well, where it would become slow.  She reports that she was afraid to leave him alone 2 days ago and yesterday, but this morning he is talking relatively well. He describes dizziness as lightheadedness, he feels like he is floating and would rather sit in his recliner,  although still feeling the same sensation even if sitting down.  His wife reports that he is very active, but over the past 3 months has been more sleepy, he fades out and just goes to sleep.  He has been having new onset headaches with a pressure sensation over his forehead lasting 15 minutes or so, with no associated nausea, vomiting, photo or phonophobia.  He does not take any medication for this.  He continues to have the constant tinnitus.  He has occasional hand tremors that do not affect his daily activities except his writing is more sloppy.  He has a history of peripheral vascular disease s/p stents in both lower extremities.  He has had tingling and burning around his ankles for the past 6-7 years.  He is concerned about a bruise in his right inner thigh noted last week with no known trauma.  He feels his memory has been worsening, he forgets conversations easily.  He has been forgetting to take his medications "just about everyday" for the past 3 years.  He reports an episode 6 months ago when he woke up with his mouth twisted, tongue numb, with slurred speech. He drove to the store then went to his nephew's house and drove the wrong way.  He has gotten turned around driving a couple of times, 2 months ago he forgot how to get somewhere he goes to frequently. One time his wife had to drive down the road looking for him.  His wife has always been in charge of bills.  No family history of memory problems.  He denies any anosmia, constipation, REM behavior  disorder but has sleep jerks that wake him up. He denies any diplopia, blurred vision, dysphagia, bowel/bladder incontinence, some urinary urgency with the BPH. He has occasional neck and back stiffness.  There is no family history of similar symptoms.  He has been taking gabapentin for cramps with good effect.    Laboratory Data:  Lab Results  Component Value Date   WBC 5.3 10/18/2013   HGB 14.2 10/18/2013   HCT 43.0 10/18/2013   MCV 99.1 10/18/2013     PLT 212.0 10/18/2013     Chemistry      Component Value Date/Time   NA 139 11/26/2013 1409   K 4.5 11/26/2013 1409   CL 104 11/26/2013 1409   CO2 27 11/26/2013 1409   BUN 10 11/26/2013 1409   CREATININE 1.1 11/26/2013 1409      Component Value Date/Time   CALCIUM 9.5 11/26/2013 1409   ALKPHOS 107 11/26/2013 1409   AST 25 11/26/2013 1409   ALT 10 11/26/2013 1409   BILITOT 0.6 11/26/2013 1409     Lab Results  Component Value Date   CHOL 183 11/26/2013   HDL 60.00 11/26/2013   LDLCALC 109* 11/26/2013   LDLDIRECT 145.2 11/20/2010   TRIG 69.0 11/26/2013   CHOLHDL 3 11/26/2013     PAST MEDICAL HISTORY: Past Medical History  Diagnosis Date  . Peripheral vascular disease, unspecified   . Angioedema   . Essential hypertension   . Hyperlipidemia   . Personal history of tobacco use, presenting hazards to health   . IBS (irritable bowel syndrome)   . BPH (benign prostatic hyperplasia)   . Anemia   . Impotence   . Personal history of colonic adenoma 09/22/2012  . Helicobacter pylori gastritis 10/10/2012  .  iron deficiency 01/18/2013    PAST SURGICAL HISTORY: Past Surgical History  Procedure Laterality Date  . Abdominal surg for ulcers'    . Distal aortogram    . Percutaneous transluminal angionplasty with placement of 2 self expanding stent in the distal left superfical femeral artery    . Colonoscopy N/A 09/22/2012    Procedure: COLONOSCOPY;  Surgeon: Gatha Mayer, MD;  Location: [facility];  Service: Endoscopy;  Laterality: N/A;    MEDICATIONS: Current Outpatient Prescriptions on File Prior to Visit  Medication Sig Dispense Refill  . albuterol (PROVENTIL HFA;VENTOLIN HFA) 108 (90 BASE) MCG/ACT inhaler Inhale 2 puffs into the lungs every 6 (six) hours as needed for wheezing.  1 Inhaler  3  . aspirin 81 MG tablet Take 81 mg by mouth daily.        . beclomethasone (QVAR) 40 MCG/ACT inhaler Inhale 2 puffs into the lungs 2 (two) times daily.  1 Inhaler  1  . doxazosin (CARDURA) 4 MG  tablet TAKE 1 TABLET BY MOUTH TWICE DAILY  180 tablet  0  . esomeprazole (NEXIUM) 40 MG capsule Take 1 capsule (40 mg total) by mouth daily.  30 capsule  5  . ferrous sulfate 325 (65 FE) MG tablet Take 325 mg by mouth daily.      Marland Kitchen gabapentin (NEURONTIN) 300 MG capsule TAKE 1 CAPSULE BY MOUTH THREE TIMES DAILY  270 capsule  1  . meclizine (ANTIVERT) 12.5 MG tablet Take 1 tablet (12.5 mg total) by mouth 3 (three) times daily as needed for dizziness.  30 tablet  0   No current facility-administered medications on file prior to visit.    ALLERGIES: Allergies  Allergen Reactions  . Ace Inhibitors  REACTION: antioedema  . Penicillins Hives    FAMILY HISTORY: Family History  Problem Relation Age of Onset  . Diabetes Mother   . Diabetes Father   . Diabetes Brother   . Colon cancer Neg Hx     SOCIAL HISTORY: History   Social History  . Marital Status: Married    Spouse Name: N/A    Number of Children: N/A  . Years of Education: N/A   Occupational History  . Not on file.   Social History Main Topics  . Smoking status: Current Every Day Smoker  . Smokeless tobacco: Never Used  . Alcohol Use: No     Comment: OCC  . Drug Use: No  . Sexual Activity: Not on file   Other Topics Concern  . 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)   Alcohol use- occasionally   No illict drugs   REVIEW OF SYSTEMS: Constitutional: No fevers, chills, or sweats, no generalized fatigue, change in appetite Eyes: No visual changes, double vision, eye pain Ear, nose and throat: No hearing loss, ear pain, nasal congestion, sore throat Cardiovascular: No chest pain, palpitations Respiratory:  No shortness of breath at rest or with exertion, wheezes GastrointestinaI: No nausea, vomiting, diarrhea, abdominal pain, fecal incontinence Genitourinary:  No dysuria, urinary retention or frequency Musculoskeletal:  +occl neck  pain, back pain Integumentary: No rash, pruritus, skin lesions Neurological: as above Psychiatric: No depression, insomnia, anxiety Endocrine: No palpitations, fatigue, diaphoresis, mood swings, change in appetite, change in weight, increased thirst Hematologic/Lymphatic:  No anemia, purpura, petechiae. Allergic/Immunologic: no itchy/runny eyes, nasal congestion, recent allergic reactions, rashes  PHYSICAL EXAM: Filed Vitals:   12/20/13 0802  BP: 150/84  Pulse: 72   General: No acute distress Head:  Normocephalic/atraumatic Eyes: Fundoscopic exam shows bilateral sharp discs, no vessel changes, exudates, or hemorrhages Neck: supple, no paraspinal tenderness, full range of motion Back: No paraspinal tenderness Heart: regular rate and rhythm Lungs: Clear to auscultation bilaterally. Vascular: No carotid bruits. Skin/Extremities: No rash, no edema. +bruise in the right inner thigh Neurological Exam: Mental status: alert and oriented to person, place, and time, no dysarthria or aphasia, Fund of knowledge is appropriate.  Remote memory intact.  Attention and concentration are normal.    Able to name objects and repeat phrases.  Montreal Cognitive Assessment  12/23/2013  Visuospatial/ Executive (0/5) 3  Naming (0/3) 3  Attention: Read list of digits (0/2) 2  Attention: Read list of letters (0/1) 1  Attention: Serial 7 subtraction starting at 100 (0/3) 2  Language: Repeat phrase (0/2) 2  Language : Fluency (0/1) 0  Abstraction (0/2) 2  Delayed Recall (0/5) 3  Orientation (0/6) 6  Total 24  Adjusted Score (based on education) 24   Cranial nerves: CN I: not tested CN II: pupils equal, round and reactive to light, visual fields intact, fundi unremarkable. CN III, IV, VI:  full range of motion, no nystagmus, no ptosis CN V: facial sensation intact CN VII: upper and lower face symmetric CN VIII: hearing intact to finger rub CN IX, X: gag intact, uvula midline CN XI:  sternocleidomastoid and trapezius muscles intact CN XII: tongue midline Bulk & Tone: normal, no cogwheeling, no fasciculations. Motor: 5/5 throughout with no pronator drift. Sensation: decreased vibration up to ankles bilaterally, otherwise intact to light touch, cold, pin,  and joint position sense.  No extinction to double simultaneous stimulation.  Romberg  test negative Deep Tendon Reflexes: +1 both UE, brisk +3 right patella, +2 left patella and both ankles. no ankle clonus, negative Hoffman sign Plantar responses: downgoing bilaterally Cerebellar: no incoordination on finger to nose, heel to shin. No dysdiadochokinesia Gait: slow and cautious, no ataxia, no magnetic gait noted, good arm swing Tremor: none No postural instability  IMPRESSION: This is a pleasant 73 year old right-handed man with a history of hypertension, hyperdlipidemia, BPH, peripheral vascular disease, presenting with worsening balance problems with note of episodes of change in speech over the past month. His MRI brain without contrast 5 months ago showed prominence of ventricles, mild hydrocephalus could not be excluded. No other signs of normal pressure hydrocephalus on exam today. 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  A repeat MRI brain with and without contrast will be ordered to assess for interval change. The episodic nature is concerning, MRA head without contrast will be ordered to assess for vertebrobasilar insufficiency.  He will be referred to physical therapy for balance and gait therapy, continue current medications at this time.  He is concerned about the atraumatic right inner thigh hematoma and will re-establish care with his vascular surgeon.  He will follow-up after the tests.    Thank you for allowing me to participate in the care of this patient. Please do not hesitate to call for any questions or concerns.   Ellouise Newer, M.D.

## 2013-12-20 NOTE — Patient Instructions (Signed)
1. MRI brain with and without contrast 2. MRA head without contrast 3. Referral to physical therapy for gait instability 4. Refer to Vascular Surgery for follow-up on right thigh hematoma (Dr. Trula Slade had seen him previously)

## 2013-12-21 ENCOUNTER — Telehealth: Payer: Self-pay | Admitting: Neurology

## 2013-12-21 NOTE — Telephone Encounter (Signed)
Pt called/returning your call [phone]

## 2013-12-21 NOTE — Telephone Encounter (Signed)
Pt's emergency contact called requesting to speak to a nurse regarding his meds. Please call her back # [phone]

## 2013-12-21 NOTE — Telephone Encounter (Signed)
Left msg on vm to return my call. 

## 2013-12-23 ENCOUNTER — Encounter: Payer: Self-pay | Admitting: Neurology

## 2013-12-24 ENCOUNTER — Telehealth: Payer: Self-pay | Admitting: Neurology

## 2013-12-24 NOTE — Telephone Encounter (Signed)
Left another msg for pts wife/Brenda to return my call.

## 2013-12-24 NOTE — Telephone Encounter (Signed)
Spoke with patient's wife. She did want to clarify that pt is taking Plavix 75 mg qd. At his office visit they were unsure if he was still taking med.

## 2013-12-24 NOTE — Telephone Encounter (Signed)
Please call patient wife brenda at [phone]

## 2013-12-25 ENCOUNTER — Ambulatory Visit (INDEPENDENT_AMBULATORY_CARE_PROVIDER_SITE_OTHER)
Admission: RE | Admit: 2013-12-25 | Discharge: 2013-12-25 | Disposition: A | Discharge status: Home / Self Care | Payer: Medicare HMO | Source: Ambulatory Visit | Attending: Surgery | Admitting: Surgery

## 2013-12-25 ENCOUNTER — Ambulatory Visit (HOSPITAL_COMMUNITY)
Admission: RE | Admit: 2013-12-25 | Discharge: 2013-12-25 | Disposition: A | Discharge status: Home / Self Care | Payer: Medicare HMO | Source: Ambulatory Visit | Attending: Surgery | Admitting: Surgery

## 2013-12-25 DIAGNOSIS — I739 Peripheral vascular disease, unspecified: Secondary | ICD-10-CM

## 2013-12-25 DIAGNOSIS — Z48812 Encounter for surgical aftercare following surgery on the circulatory system: Secondary | ICD-10-CM

## 2013-12-28 ENCOUNTER — Ambulatory Visit (HOSPITAL_COMMUNITY): Admission: RE | Admit: 2013-12-28 | Payer: Medicare HMO | Source: Ambulatory Visit

## 2013-12-28 ENCOUNTER — Encounter: Payer: Self-pay | Admitting: Surgery

## 2013-12-28 ENCOUNTER — Ambulatory Visit (HOSPITAL_COMMUNITY)
Admission: RE | Admit: 2013-12-28 | Discharge: 2013-12-28 | Disposition: A | Discharge status: Home / Self Care | Payer: Medicare HMO | Source: Ambulatory Visit | Attending: Neurology | Admitting: Neurology

## 2013-12-28 DIAGNOSIS — G912 (Idiopathic) normal pressure hydrocephalus: Secondary | ICD-10-CM | POA: Insufficient documentation

## 2013-12-28 DIAGNOSIS — R413 Other amnesia: Secondary | ICD-10-CM | POA: Diagnosis present

## 2013-12-28 DIAGNOSIS — R51 Headache: Secondary | ICD-10-CM | POA: Insufficient documentation

## 2013-12-28 MED ORDER — GADOBENATE DIMEGLUMINE 529 MG/ML IV SOLN
15.0000 mL | Freq: Once | INTRAVENOUS | Status: AC | PRN
  Administered 2013-12-28: 15 mL via INTRAVENOUS

## 2013-12-31 ENCOUNTER — Encounter: Payer: Self-pay | Admitting: Surgery

## 2013-12-31 ENCOUNTER — Ambulatory Visit (INDEPENDENT_AMBULATORY_CARE_PROVIDER_SITE_OTHER): Payer: Commercial Managed Care - HMO | Admitting: Surgery

## 2013-12-31 VITALS — BP 176/101 | HR 70 | Ht 68.0 in | Wt 167.4 lb

## 2013-12-31 DIAGNOSIS — I70219 Atherosclerosis of native arteries of extremities with intermittent claudication, unspecified extremity: Secondary | ICD-10-CM

## 2013-12-31 NOTE — Progress Notes (Signed)
Patient name: Henry Carter MRN: [id number] DOB: [birth year] Sex: male   Referred by: Dr. Delice Lesch  Reason for referral:  Chief Complaint  Patient presents with  . Re-evaluation    1-2 wk f/u     HISTORY OF PRESENT ILLNESS: This is a 73 year old gentleman who I have seen in the remote past, back in 2011 for bilateral claudication.  He initially underwent subintimal recanalization with subsequent stenting on 12/18/2008 by Dr. Julianne Handler.  I performed a subintimal recanalization with subsequent stenting of the right leg in November of 2010.  At that time, the patient was having claudication symptoms at 50 feet without rest pain or ulceration.  His symptoms have resolved.  Approximately one month ago he found to have a bruise in his right thigh.  He then noticed bulging veins in his right leg.  These areas resolved overnight.  He was concerned that his stents may have gone down and wanted to be evaluated.  He does complain of some numbness in his legs.  He is taking Neurontin.  The patient has quit smoking in the past but is currently smoking with plans to stop next week with Chantix.  The patient has hypercholesterolemia but does not take a statin.  He is medically managed for his hypertension.  He struggles with balance issues and is taking meclizine.  He does complain of edema at his ankles bilaterally.  Past Medical History  Diagnosis Date  . Peripheral vascular disease, unspecified   . Angioedema   . Essential hypertension   . Hyperlipidemia   . Personal history of tobacco use, presenting hazards to health   . IBS (irritable bowel syndrome)   . BPH (benign prostatic hyperplasia)   . Anemia   . Impotence   . Personal history of colonic adenoma 09/22/2012  . Helicobacter pylori gastritis 10/10/2012  .  iron deficiency 01/18/2013    Past Surgical History  Procedure Laterality Date  . Abdominal surg for ulcers'    . Distal aortogram    . Percutaneous transluminal angionplasty with  placement of 2 self expanding stent in the distal left superfical femeral artery    . Colonoscopy N/A 09/22/2012    Procedure: COLONOSCOPY;  Surgeon: Gatha Mayer, MD;  Location: [facility];  Service: Endoscopy;  Laterality: N/A;    History   Social History  . Marital Status: Married    Spouse Name: N/A    Number of Children: N/A  . Years of Education: N/A   Occupational History  . Not on file.   Social History Main Topics  . Smoking status: Current Every Day Smoker -- 0.25 packs/day    Types: Cigarettes  . Smokeless tobacco: Never Used  . Alcohol Use: No     Comment: OCC  . Drug Use: No  . Sexual Activity: Not on file   Other Topics Concern  . 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 Diabetes Mother   . Diabetes Father   . Diabetes Brother   . Colon cancer Neg Hx     Allergies as of 12/31/2013 - Review Complete 12/31/2013  Allergen Reaction Noted  . Ace inhibitors  05/19/2007  . Penicillins Hives 02/01/2007    Current Outpatient Prescriptions on File Prior to Visit  Medication Sig Dispense  Refill  . albuterol (PROVENTIL HFA;VENTOLIN HFA) 108 (90 BASE) MCG/ACT inhaler Inhale 2 puffs into the lungs every 6 (six) hours as needed for wheezing.  1 Inhaler  3  . aspirin 81 MG tablet Take 81 mg by mouth daily.        . beclomethasone (QVAR) 40 MCG/ACT inhaler Inhale 2 puffs into the lungs 2 (two) times daily.  1 Inhaler  1  . clopidogrel (PLAVIX) 75 MG tablet Take 75 mg by mouth daily.      Marland Kitchen doxazosin (CARDURA) 4 MG tablet TAKE 1 TABLET BY MOUTH TWICE DAILY  180 tablet  0  . esomeprazole (NEXIUM) 40 MG capsule Take 1 capsule (40 mg total) by mouth daily.  30 capsule  5  . ferrous sulfate 325 (65 FE) MG tablet Take 325 mg by mouth daily.      Marland Kitchen gabapentin (NEURONTIN) 300 MG capsule TAKE 1  CAPSULE BY MOUTH THREE TIMES DAILY  270 capsule  1  . meclizine (ANTIVERT) 12.5 MG tablet Take 1 tablet (12.5 mg total) by mouth 3 (three) times daily as needed for dizziness.  30 tablet  0   No current facility-administered medications on file prior to visit.     REVIEW OF SYSTEMS: Cardiovascular: No chest pain, chest pressure, palpitations, orthopnea, or dyspnea on exertion. No claudication or rest pain, please see history of present illness Pulmonary: No productive cough, asthma or wheezing. Neurologic: Positive dizziness. Hematologic: No bleeding problems or clotting disorders. Musculoskeletal: No joint pain or joint swelling. Gastrointestinal: No blood in stool or hematemesis Genitourinary: No dysuria or hematuria. Psychiatric:: No history of major depression. Integumentary: No rashes or ulcers. Constitutional: No fever or chills.  PHYSICAL EXAMINATION: General: The patient appears their stated age.  Vital signs are BP 176/101  Pulse 70  Ht 5\' 8"  (1.727 m)  Wt 167 lb 6.4 oz (75.932 kg)  BMI 25.46 kg/m2  SpO2 100% HEENT:  No gross abnormalities Pulmonary: Respirations are non-labored Abdomen: Soft and non-tender.  Aorta is palpable and does not feel aneurysmal  Musculoskeletal: There are no major deformities.   Neurologic: No focal weakness or paresthesias are detected, Skin: There are no ulcer or rashes noted. Psychiatric: The patient has normal affect. Cardiovascular: There is a regular rate and rhythm without significant murmur appreciated.  Pedal pulses are not palpable.  No carotid bruits.  Diagnostic Studies: I have ordered and reviewed the patient's vascular lab studies.  This reveals an ankle-brachial index of 1.09 on the right with biphasic waveforms and 1.09 on the left with biphasic waveforms.  Duplex shows no significant stenosis within his previously deployed stent   Assessment:  Peripheral vascular disease with bilateral claudication, status post bilateral  stenting Plan: Ultrasound today confirms that the stents in both legs are widely patent without evidence of in-stent stenosis.  It is unclear to me who went the patient's episode several weeks ago was the result of.  Regardless, these episodes have resolved with out further symptomatology.  I would recommend continuing the aspirin and Plavix.  He'll followup with me in one year with a duplex ultrasound an ankle-brachial indices.  He needs to consider a trial of a statin 4 hypercholesterolemia given his peripheral vascular disease.     Eldridge Abrahams, M.D. [facility] Office: [phone] Pager:  [phone]

## 2014-01-01 ENCOUNTER — Telehealth: Payer: Self-pay | Admitting: Neurology

## 2014-01-01 NOTE — Addendum Note (Signed)
Addended by: Mena Goes on: 01/01/2014 12:57 PM   Modules accepted: Orders

## 2014-01-01 NOTE — Telephone Encounter (Signed)
MRI/MRA reviewed. MRI brain unchanged from prior scan, still with enlarged ventricles out of proportion to sulci. 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 Findings d/w patient and his wife. He states that the staggering is not everyday, some days he is fine, others he won't leave the bed. But does say that walking is slower. He has not scheduled PT yet. Discussed doing LP for NPH, we have agreed to do physical therapy first, and if no improvement, then proceed with LP. Also discussed possible aneurysms, incidental finding, asymptomatic from these. Low annual rupture risk. We will do serial imaging, but they know to call for any clinical changes. F/u in 4-5 weeks.

## 2014-01-04 ENCOUNTER — Ambulatory Visit (INDEPENDENT_AMBULATORY_CARE_PROVIDER_SITE_OTHER): Payer: Commercial Managed Care - HMO | Admitting: Internal Medicine

## 2014-01-04 ENCOUNTER — Encounter: Payer: Self-pay | Admitting: Internal Medicine

## 2014-01-04 VITALS — BP 148/82 | HR 87 | Temp 97.6°F | Resp 14 | Ht 69.0 in | Wt 164.8 lb

## 2014-01-04 DIAGNOSIS — I10 Essential (primary) hypertension: Secondary | ICD-10-CM

## 2014-01-04 DIAGNOSIS — D500 Iron deficiency anemia secondary to blood loss (chronic): Secondary | ICD-10-CM

## 2014-01-04 DIAGNOSIS — J449 Chronic obstructive pulmonary disease, unspecified: Secondary | ICD-10-CM

## 2014-01-04 DIAGNOSIS — I70219 Atherosclerosis of native arteries of extremities with intermittent claudication, unspecified extremity: Secondary | ICD-10-CM

## 2014-01-04 DIAGNOSIS — R2681 Unsteadiness on feet: Secondary | ICD-10-CM

## 2014-01-04 DIAGNOSIS — R29898 Other symptoms and signs involving the musculoskeletal system: Secondary | ICD-10-CM

## 2014-01-04 DIAGNOSIS — R269 Unspecified abnormalities of gait and mobility: Secondary | ICD-10-CM

## 2014-01-04 DIAGNOSIS — N401 Enlarged prostate with lower urinary tract symptoms: Secondary | ICD-10-CM

## 2014-01-04 DIAGNOSIS — H9311 Tinnitus, right ear: Secondary | ICD-10-CM

## 2014-01-04 DIAGNOSIS — E785 Hyperlipidemia, unspecified: Secondary | ICD-10-CM

## 2014-01-04 DIAGNOSIS — H9319 Tinnitus, unspecified ear: Secondary | ICD-10-CM

## 2014-01-04 NOTE — Assessment & Plan Note (Signed)
BP fairly well controlled and after gait is taken care of may need change to his regimen if still mildly elevated.

## 2014-01-04 NOTE — Assessment & Plan Note (Signed)
Concern for NPH given MRI findings and he does have the triad of symptoms. His gait is unsteady, he is having more memory impairment, and increased urinary frequency although he denies incontinence.

## 2014-01-04 NOTE — Progress Notes (Signed)
   Subjective:    Patient ID: Henry Carter, male    DOB: 01/06/1941, 73 y.o.   MRN: [id number]  HPI The patient is a 73 YO man who is coming in today to establish care. He has PMH of BPH, COPD, HTN, hyperlipidemia, PVD. He is having more problems with his balance and his left leg is dragging recently. He saw the neurologist and was told to go to PT but he has been unable to get a hold of them to schedule. He is also having more urgency with urination and feels his BPH medicine is not doing as well. During the same period of the last several months he is having more memory disturbance and does not feel his memory is good. He had to have his wife drive the other day as he got confused at the gas station.   Review of Systems  Constitutional: Positive for activity change and fatigue. Negative for fever, appetite change and unexpected weight change.  HENT: Negative.   Respiratory: Negative for cough, chest tightness, shortness of breath and wheezing.   Gastrointestinal: Negative for abdominal pain and abdominal distention.       Dark stools, on iron  Genitourinary: Positive for urgency and frequency. Negative for difficulty urinating.  Musculoskeletal: Positive for arthralgias and gait problem.  Skin: Negative.   Neurological: Positive for dizziness and weakness. Negative for syncope, facial asymmetry, light-headedness and headaches.  Psychiatric/Behavioral: Positive for confusion.      Objective:   Physical Exam  Vitals reviewed. Constitutional: He is oriented to person, place, and time. He appears well-developed and well-nourished. No distress.  HENT:  Head: Normocephalic and atraumatic.  Eyes: EOM are normal. Pupils are equal, round, and reactive to light.  Neck: Normal range of motion.  Cardiovascular: Normal rate and regular rhythm.   Pulmonary/Chest: Effort normal and breath sounds normal.  Abdominal: Soft. Bowel sounds are normal.  Neurological: He is alert and oriented to person,  place, and time. Coordination abnormal.  Skin: Skin is warm and dry. He is not diaphoretic.   Filed Vitals:   01/04/14 0825  BP: 148/82  Pulse: 87  Temp: 97.6 F (36.4 C)  TempSrc: Oral  Resp: 14  Height: 5\' 9"  (1.753 m)  Weight: 164 lb 12.8 oz (74.753 kg)  SpO2: 92%      Assessment & Plan:

## 2014-01-04 NOTE — Assessment & Plan Note (Signed)
Does well with his qvar and rarely uses the albuterol but has used it twice in the last 6 months.

## 2014-01-04 NOTE — Assessment & Plan Note (Signed)
He continues to take his cardura but is having more symptoms. Feel that these symptoms could be related to concern for NPH rather than progression of his BPH and will await neurology visit early next month.

## 2014-01-04 NOTE — Progress Notes (Signed)
Pre visit review using our clinic review tool, if applicable. No additional management support is needed unless otherwise documented below in the visit note. 

## 2014-01-04 NOTE — Assessment & Plan Note (Signed)
Stable, takes ASA daily.

## 2014-01-04 NOTE — Assessment & Plan Note (Signed)
He does take iron daily and last Hg was stable at 14.

## 2014-01-04 NOTE — Patient Instructions (Signed)
We would like you to start physical therapy. If this does not help the neurologist can have to work on another way to help your balance.   Normal-Pressure Hydrocephalus Normal-pressure hydrocephalus (NPH) is a buildup of fluid (cerebrospinal fluid [CSF]) inside the brain. CSF is normal within the brain, but too much fluid can affect your brain's function. NPH commonly occurs in people older than 72 years of age.  CAUSES  The cause of NPH is not always known. It can be caused by anything that blocks the flow of CSF.  SIGNS AND SYMPTOMS  Since many symptoms of NPH are also associated with conditions sometimes seen in older people, taking care to note changes in behavior and mental function is important. Symptoms of NPH may include:   Difficulty walking, such as:  Problems when beginning to walk.  Feet being "frozen" to the floor.  Shuffling feet when walking.  Unsteadiness.  Problems with bowel and bladder control.  Memory problems such as forgetfulness, lack of concentration, dull mood, or confusion. DIAGNOSIS   A medical history and physical exam will be done. A physical exam can reveal walking (gait) changes. Reflexes may be increased in the lower legs.  Tests can include:  Lumbar puncture (spinal tap).  CT scan of your head.  MRI scan of your head. TREATMENT  NPH is treated by having surgery to place a ventriculoperitoneal shunt in the brain. The ventriculoperitoneal shunt drains the excess CSF.  Document Released: 08/13/2013 Document Reviewed: 04/03/2013 [facility] Patient Information 2015 [place], [place]. This information is not intended to replace advice given to you by your health care provider. Make sure you discuss any questions you have with your health care provider.

## 2014-01-04 NOTE — Assessment & Plan Note (Signed)
Not on statin at this time and last lipid panel stable.

## 2014-01-04 NOTE — Assessment & Plan Note (Signed)
He has seen ENT for this and sounds like they were trying to use noise cancellation device which he did not like. Stable.

## 2014-01-07 ENCOUNTER — Ambulatory Visit: Payer: Medicare HMO | Attending: Neurology | Admitting: Physical Therapy

## 2014-01-07 DIAGNOSIS — IMO0001 Reserved for inherently not codable concepts without codable children: Secondary | ICD-10-CM | POA: Insufficient documentation

## 2014-01-07 DIAGNOSIS — R269 Unspecified abnormalities of gait and mobility: Secondary | ICD-10-CM | POA: Insufficient documentation

## 2014-01-14 ENCOUNTER — Ambulatory Visit: Payer: Commercial Managed Care - HMO | Attending: Neurology | Admitting: Physical Therapy

## 2014-01-14 DIAGNOSIS — R2681 Unsteadiness on feet: Secondary | ICD-10-CM | POA: Diagnosis present

## 2014-01-14 DIAGNOSIS — R42 Dizziness and giddiness: Secondary | ICD-10-CM | POA: Insufficient documentation

## 2014-01-14 DIAGNOSIS — R269 Unspecified abnormalities of gait and mobility: Secondary | ICD-10-CM | POA: Diagnosis not present

## 2014-01-16 ENCOUNTER — Ambulatory Visit: Payer: Commercial Managed Care - HMO | Admitting: Physical Therapy

## 2014-01-16 DIAGNOSIS — R269 Unspecified abnormalities of gait and mobility: Secondary | ICD-10-CM | POA: Diagnosis not present

## 2014-01-17 ENCOUNTER — Ambulatory Visit (INDEPENDENT_AMBULATORY_CARE_PROVIDER_SITE_OTHER): Payer: Commercial Managed Care - HMO | Admitting: Neurology

## 2014-01-17 ENCOUNTER — Encounter: Payer: Self-pay | Admitting: Neurology

## 2014-01-17 VITALS — BP 122/84 | HR 84 | Resp 16 | Ht 68.0 in | Wt 164.0 lb

## 2014-01-17 DIAGNOSIS — G919 Hydrocephalus, unspecified: Secondary | ICD-10-CM

## 2014-01-17 DIAGNOSIS — R413 Other amnesia: Secondary | ICD-10-CM

## 2014-01-17 DIAGNOSIS — R2681 Unsteadiness on feet: Secondary | ICD-10-CM

## 2014-01-17 NOTE — Progress Notes (Signed)
NEUROLOGY FOLLOW UP OFFICE NOTE  Henry Carter [id number]  HISTORY OF PRESENT ILLNESS: I had the pleasure of seeing Henry Carter in follow-up in the neurology clinic on 01/17/2014.  The patient was last seen a month ago for gait instability that occurs intermittently and memory loss.  He is by himself today, and reports that he drove without any difficulties.  Records and images were personally reviewed where available.  I personally reviewed MRI brain with and without contrast, which was unchanged from MRI brain 5 months prior with ventricular prominence out of proportion to the sulci, raising the possibility of NPH. MRA did not show any significant stenosis, there was question of 81mm aneurysm in the left pCOM and on the right ACOM region. The vessels are tortuous but it is suspected these are small aneurysms that need interval imaging follow-up.  He has been to physical therapy twice since his last visit. I spoke to his physical therapist Amy, who agrees that there is no evidence of typical magnetic gait in [name] that is seen with NPH.  He has bilateral leg weakness, with decreased foot clearance on left LE. He tells me today that he walks better with a cane, he notices he catches his right foot when walking. No falls. He feels his memory is better, 2 weeks ago he went for gas and parked opposite the gas tank, but states that this has not recurred and drove to the office today by himself. His wife asked him this morning if he could make it because he was a little disoriented but he states he was fine.   HPI:  This is a pleasant 73 yo RH man with a history of hypertension, hyperlipidemia, peripheral vascular disease s/p bilateral stent in LE, BPH, who presented for gait instability that he describes as "staggering." He had initially reported these symptoms to his previous PCP [name] in March 2015, where he reported staggering worse in the morning, balance being off, and several months  of tinnitus without hearing loss. I reviewed MRI brain without contrast done at that time which did not show any acute changes, however note of ventricular prominence slightly out of proportion to sulcal prominence, mild hydrocephalus could not be completely excluded. Aqueduct noted to be patent. He and his wife report that at that time, staggering was not as bad, since August 2015, his wife has noticed a significant difference. He would walk down the hallway and go against the wall, close to falling but always catching himself. His wife describes him as appearing like he is "under the influence of something." His speech has been affected as well, where it would become slow. He describes dizziness as lightheadedness, he feels like he is floating and would rather sit in his recliner, although still feeling the same sensation even if sitting down. His wife reports that he is very active, but over the past 3 months has been more sleepy, he fades out and just goes to sleep. He has been having new onset headaches with a pressure sensation over his forehead lasting 15 minutes or so, with no associated nausea, vomiting, photo or phonophobia. He does not take any medication for this. He continues to have the constant tinnitus. He has occasional hand tremors that do not affect his daily activities except his writing is more sloppy.   He feels his memory has been worsening, he forgets conversations easily. He has been forgetting to take his medications "just about everyday" for the past 3 years. He  reports an episode 6 months ago when he woke up with his mouth twisted, tongue numb, with slurred speech. He drove to the store then went to his nephew's house and drove the wrong way. He has gotten turned around driving a couple of times, 2 months ago he forgot how to get somewhere he goes to frequently. One time his wife had to drive down the road looking for him. His wife has always been in charge of bills. No family history  of memory problems.   He denies any anosmia, constipation, REM behavior disorder but has sleep jerks that wake him up. He denies any diplopia, blurred vision, dysphagia, bowel/bladder incontinence, some urinary urgency with the BPH. He has occasional neck and back stiffness. There is no family history of similar symptoms. He has been taking gabapentin for cramps with good effect.   PAST MEDICAL HISTORY: Past Medical History  Diagnosis Date  . Peripheral vascular disease, unspecified   . Angioedema   . Essential hypertension   . Hyperlipidemia   . Personal history of tobacco use, presenting hazards to health   . IBS (irritable bowel syndrome)   . BPH (benign prostatic hyperplasia)   . Anemia   . Impotence   . Personal history of colonic adenoma 09/22/2012  . Helicobacter pylori gastritis 10/10/2012  .  iron deficiency 01/18/2013    MEDICATIONS: Current Outpatient Prescriptions on File Prior to Visit  Medication Sig Dispense Refill  . albuterol (PROVENTIL HFA;VENTOLIN HFA) 108 (90 BASE) MCG/ACT inhaler Inhale 2 puffs into the lungs every 6 (six) hours as needed for wheezing.  1 Inhaler  3  . aspirin 81 MG tablet Take 81 mg by mouth daily.        . beclomethasone (QVAR) 40 MCG/ACT inhaler Inhale 2 puffs into the lungs 2 (two) times daily.  1 Inhaler  1  . clopidogrel (PLAVIX) 75 MG tablet Take 75 mg by mouth daily.      Marland Kitchen doxazosin (CARDURA) 4 MG tablet TAKE 1 TABLET BY MOUTH TWICE DAILY  180 tablet  0  . esomeprazole (NEXIUM) 40 MG capsule Take 1 capsule (40 mg total) by mouth daily.  30 capsule  5  . ferrous sulfate 325 (65 FE) MG tablet Take 325 mg by mouth daily.      Marland Kitchen gabapentin (NEURONTIN) 300 MG capsule TAKE 1 CAPSULE BY MOUTH THREE TIMES DAILY  270 capsule  1  . meclizine (ANTIVERT) 12.5 MG tablet Take 1 tablet (12.5 mg total) by mouth 3 (three) times daily as needed for dizziness.  30 tablet  0   No current facility-administered medications on file prior to visit.     ALLERGIES: Allergies  Allergen Reactions  . Ace Inhibitors     REACTION: antioedema  . Penicillins Hives    FAMILY HISTORY: Family History  Problem Relation Age of Onset  . Diabetes Mother   . Diabetes Father   . Diabetes Brother   . Colon cancer Neg Hx     SOCIAL HISTORY: History   Social History  . Marital Status: Married    Spouse Name: N/A    Number of Children: N/A  . Years of Education: N/A   Occupational History  . Not on file.   Social History Main Topics  . Smoking status: Current Every Day Smoker -- 0.25 packs/day    Types: Cigarettes  . Smokeless tobacco: Never Used  . Alcohol Use: No     Comment: OCC  . Drug Use: No  . Sexual  Activity: Not on file   Other Topics Concern  . 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)   Alcohol use- occasionally   No illict drugs    REVIEW OF SYSTEMS: Constitutional: No fevers, chills, or sweats, no generalized fatigue, change in appetite Eyes: No visual changes, double vision, eye pain Ear, nose and throat: No hearing loss, ear pain, nasal congestion, sore throat Cardiovascular: No chest pain, palpitations Respiratory:  No shortness of breath at rest or with exertion, wheezes GastrointestinaI: No nausea, vomiting, diarrhea, abdominal pain, fecal incontinence Genitourinary:  No dysuria, urinary retention, +frequency Musculoskeletal:  + occl neck pain, back pain Integumentary: No rash, pruritus, skin lesions Neurological: as above Psychiatric: No depression, insomnia, anxiety Endocrine: No palpitations, fatigue, diaphoresis, mood swings, change in appetite, change in weight, increased thirst Hematologic/Lymphatic:  No anemia, purpura, petechiae. Allergic/Immunologic: no itchy/runny eyes, nasal congestion, recent allergic reactions, rashes  PHYSICAL EXAM: Filed Vitals:   01/17/14 0806  BP: 122/84  Pulse: 84  Resp: 16    General: No acute distress Head:  Normocephalic/atraumatic Neck: supple, no paraspinal tenderness, full range of motion Heart:  Regular rate and rhythm Lungs:  Clear to auscultation bilaterally Back: No paraspinal tenderness Skin/Extremities: No rash, no edema Neurological Exam: alert and oriented to person, place, and time. No aphasia or dysarthria. Fund of knowledge is appropriate.  Remote memory intact.  Attention and concentration are normal.    Able to name objects and repeat phrases. Cranial nerves: Pupils equal, round, reactive to light.  Fundoscopic exam unremarkable, no papilledema. Extraocular movements intact with no nystagmus. Visual fields full. Facial sensation intact. No facial asymmetry. Tongue, uvula, palate midline.  Motor: Bulk and tone normal, no cogwheeling, muscle strength 5/5 throughout with no pronator drift. Sensation: decreased vibration up to ankles bilaterally, intact to light touch. No extinction to double simultaneous stimulation. Romberg test negative Deep Tendon Reflexes: +1 both UE, brisk +3 right patella, +2 left patella and both ankles. no ankle clonus. Plantar responses: downgoing bilaterally. Cerebellar: no incoordination on finger to nose testing. Gait: improved today, with normal walking speech, able to tandem walk without assistance, no magnetic gait noted, good arm swing. Tremor: none   IMPRESSION: This is a pleasant 73 yo RH man with a history of hypertension, hyperdlipidemia, BPH, peripheral vascular disease, who presented with worsening balance problems with note of episodes of change in speech. MRI brain unchanged with again note of prominence of ventricles, mild hydrocephalus could not be excluded. He does have memory issues and reports balance problems, however his neurological exam is not consistent with typical magnetic gait seen with NPH.  Gait is actually improved today, he has been doing PT exercises. There is no clear benefit to doing a high volume  lumbar puncture to assess for change in gait before and after tap at this time.  We will continue to monitor him clinically, continue physical therapy. He may benefit from cholinesterase inhibitors for the memory changes, I will speak to his wife regarding any concerns that the patient did not bring up today.  He will follow-up in 1 month.    Thank you for allowing me to participate in his care.  Please do not hesitate to call for any questions or concerns.  The duration of this appointment visit was 15 minutes of face-to-face time with the patient.  Greater than 50% of this time was spent in counseling, explanation of diagnosis,  planning of further management, and coordination of care.   Ellouise Newer, M.D.   CC: Dr. Doug Sou

## 2014-01-17 NOTE — Patient Instructions (Signed)
1. Continue physical therapy 2. Use your cane at all times 3. Please call our office for any problems 4. Follow-up in 1 month

## 2014-01-21 ENCOUNTER — Encounter: Payer: Self-pay | Admitting: Family

## 2014-01-21 ENCOUNTER — Ambulatory Visit (INDEPENDENT_AMBULATORY_CARE_PROVIDER_SITE_OTHER): Payer: Commercial Managed Care - HMO | Admitting: Family

## 2014-01-21 VITALS — BP 130/100 | HR 111 | Temp 99.1°F | Resp 20 | Ht 69.0 in | Wt 160.0 lb

## 2014-01-21 DIAGNOSIS — L0232 Furuncle of buttock: Secondary | ICD-10-CM

## 2014-01-21 MED ORDER — DOXYCYCLINE HYCLATE 100 MG PO TABS: 100.0000 mg | ORAL_TABLET | Freq: Two times a day (BID) | ORAL | Status: DC

## 2014-01-21 MED ORDER — TRAMADOL HCL 50 MG PO TABS: 50.0000 mg | ORAL_TABLET | Freq: Four times a day (QID) | ORAL | Status: DC | PRN

## 2014-01-21 NOTE — Progress Notes (Signed)
Pre visit review using our clinic review tool, if applicable. No additional management support is needed unless otherwise documented below in the visit note. 

## 2014-01-21 NOTE — Assessment & Plan Note (Signed)
Inflammed boil. Will treat with doxycycline to cover for MRSA. Follow up in 1 week. If no improvement will consult for potential I&D.

## 2014-01-21 NOTE — Patient Instructions (Signed)
Thank you for choosing [facility].  Summary/Instructions:   Please start the doxycycline  You may use tramadol for the pain. Sitz Bath A sitz bath is a warm water bath taken in the sitting position that covers only the hips and buttocks. It may be used for either healing or hygiene purposes. Sitz baths are also used to relieve pain, itching, or muscle spasms. The water may contain medicine. Moist heat will help you heal and relax.  HOME CARE INSTRUCTIONS  Take 3 to 4 sitz baths a day. Fill the bathtub half full with warm water. Sit in the water and open the drain a little. Turn on the warm water to keep the tub half full. Keep the water running constantly. Soak in the water for 15 to 20 minutes. After the sitz bath, pat the affected area dry first. SEEK MEDICAL CARE IF:  You get worse instead of better. Stop the sitz baths if you get worse. MAKE SURE YOU: Understand these instructions. Will watch your condition. Will get help right away if you are not doing well or get worse. Document Released: 12/20/2003 Document Revised: 12/22/2011 Document Reviewed: 06/26/2010 [facility] Patient Information 2015 [place], [place]. This information is not intended to replace advice given to you by your health care provider. Make sure you discuss any questions you have with your health care provider.

## 2014-01-21 NOTE — Progress Notes (Signed)
   Subjective:    Patient ID: Henry Carter, male    DOB: [birth year], 73 y.o.   MRN: [id number]  HPI:  Henry Carter is a 73 y.o. male who presents today for a boil. Boil first  appeared on Thursday on the inside of his left gluteal.. Since that time it has progressively gotten worse fluctuating in size. Currently he is unable to lie, sleep or sit on it. Indicates he is usually able to bring his boils to a head, however this one he is unable secondary to location. Denies any treatments to the area. Unsure if he may have had a fever at one point. Denies any other signs or symptoms at present.   Allergies  Allergen Reactions  . Ace Inhibitors     REACTION: antioedema  . Penicillins Hives   Current Outpatient Prescriptions on File Prior to Visit  Medication Sig Dispense Refill  . albuterol (PROVENTIL HFA;VENTOLIN HFA) 108 (90 BASE) MCG/ACT inhaler Inhale 2 puffs into the lungs every 6 (six) hours as needed for wheezing.  1 Inhaler  3  . aspirin 81 MG tablet Take 81 mg by mouth daily.        . beclomethasone (QVAR) 40 MCG/ACT inhaler Inhale 2 puffs into the lungs 2 (two) times daily.  1 Inhaler  1  . doxazosin (CARDURA) 4 MG tablet TAKE 1 TABLET BY MOUTH TWICE DAILY  180 tablet  0  . esomeprazole (NEXIUM) 40 MG capsule Take 1 capsule (40 mg total) by mouth daily.  30 capsule  5  . ferrous sulfate 325 (65 FE) MG tablet Take 325 mg by mouth daily.      Marland Kitchen gabapentin (NEURONTIN) 300 MG capsule TAKE 1 CAPSULE BY MOUTH THREE TIMES DAILY  270 capsule  1  . meclizine (ANTIVERT) 12.5 MG tablet Take 1 tablet (12.5 mg total) by mouth 3 (three) times daily as needed for dizziness.  30 tablet  0   No current facility-administered medications on file prior to visit.   Past Medical History  Diagnosis Date  . Peripheral vascular disease, unspecified   . Angioedema   . Essential hypertension   . Hyperlipidemia   . Personal history of tobacco use, presenting hazards to health   . IBS (irritable  bowel syndrome)   . BPH (benign prostatic hyperplasia)   . Anemia   . Impotence   . Personal history of colonic adenoma 09/22/2012  . Helicobacter pylori gastritis 10/10/2012  .  iron deficiency 01/18/2013   Review of Systems    See HPI Objective:     BP 130/100  Pulse 111  Temp(Src) 99.1 F (37.3 C) (Oral)  Resp 20  Ht 5\' 9"  (1.753 m)  Wt 160 lb (72.576 kg)  BMI 23.62 kg/m2  SpO2 96% Nursing note and vital signs reviewed.  Physical Exam  Constitutional: He is oriented to person, place, and time. He appears well-developed and well-nourished. No distress.  Lying prone on the exam table.   Cardiovascular: Normal rate, regular rhythm and normal heart sounds.   Pulmonary/Chest: Effort normal and breath sounds normal.  Genitourinary:     Neurological: He is alert and oriented to person, place, and time.  Skin: Skin is warm and dry.  Psychiatric: He has a normal mood and affect. His behavior is normal. Judgment and thought content normal.        Assessment & Plan:

## 2014-01-22 ENCOUNTER — Ambulatory Visit: Payer: Commercial Managed Care - HMO

## 2014-01-24 ENCOUNTER — Ambulatory Visit: Payer: Commercial Managed Care - HMO | Admitting: Physical Therapy

## 2014-01-28 ENCOUNTER — Ambulatory Visit: Payer: Commercial Managed Care - HMO | Admitting: Physical Therapy

## 2014-01-28 ENCOUNTER — Ambulatory Visit: Payer: Commercial Managed Care - HMO | Admitting: Family

## 2014-01-28 DIAGNOSIS — Z0289 Encounter for other administrative examinations: Secondary | ICD-10-CM

## 2014-01-31 ENCOUNTER — Ambulatory Visit: Payer: Commercial Managed Care - HMO | Admitting: Physical Therapy

## 2014-02-04 ENCOUNTER — Ambulatory Visit: Payer: Commercial Managed Care - HMO | Admitting: Physical Therapy

## 2014-02-06 ENCOUNTER — Ambulatory Visit: Payer: Commercial Managed Care - HMO | Admitting: Physical Therapy

## 2014-02-26 ENCOUNTER — Telehealth: Payer: Self-pay | Admitting: Neurology

## 2014-02-26 ENCOUNTER — Ambulatory Visit: Payer: Commercial Managed Care - HMO | Admitting: Neurology

## 2014-02-26 NOTE — Telephone Encounter (Signed)
Henry Carter, pt's wife called to cancel his appt for today 02/26/14. She did not give a reason but did mention that pt will call back to r/s. C/B [phone]

## 2014-02-26 NOTE — Telephone Encounter (Signed)
Noted  

## 2014-02-27 NOTE — Telephone Encounter (Signed)
appt marked as a no show b/c pt called the day of to cancel appt but a no show letter was not sent / Sherri S.

## 2014-03-23 ENCOUNTER — Other Ambulatory Visit: Payer: Self-pay | Admitting: Medical

## 2014-03-30 ENCOUNTER — Other Ambulatory Visit: Payer: Self-pay | Admitting: Family

## 2014-04-01 ENCOUNTER — Telehealth: Payer: Self-pay | Admitting: Neurology

## 2014-04-01 ENCOUNTER — Other Ambulatory Visit: Payer: Self-pay | Admitting: Geriatric Medicine

## 2014-04-01 ENCOUNTER — Telehealth: Payer: Self-pay | Admitting: Internal Medicine

## 2014-04-01 MED ORDER — DOXAZOSIN MESYLATE 4 MG PO TABS: ORAL_TABLET | ORAL | Status: DC

## 2014-04-01 NOTE — Telephone Encounter (Signed)
Sent to pharmacy 

## 2014-04-01 NOTE — Telephone Encounter (Signed)
Called patient and advised him to call pcp for refill that's who prescribes med for him. He said he would give them a call.

## 2014-04-01 NOTE — Telephone Encounter (Signed)
Pt is requesting a refill for DOXAZOSIN 4MG   Pharmacy: [facility] in [place]  C/b [phone]

## 2014-04-01 NOTE — Telephone Encounter (Signed)
Pt came by and needed to see if Dr Doug Sou could refill his Doxazosin 4mg .  He got 4 emergency  pills from the pharmacy.   Told him that Dr Doug Sou was not in this week would have to see what we could do?     Best number is [phone] Wife

## 2014-04-10 ENCOUNTER — Encounter: Payer: Self-pay | Admitting: Internal Medicine

## 2014-04-10 ENCOUNTER — Ambulatory Visit (INDEPENDENT_AMBULATORY_CARE_PROVIDER_SITE_OTHER): Payer: Commercial Managed Care - HMO | Admitting: Internal Medicine

## 2014-04-10 ENCOUNTER — Other Ambulatory Visit (INDEPENDENT_AMBULATORY_CARE_PROVIDER_SITE_OTHER): Payer: Commercial Managed Care - HMO

## 2014-04-10 VITALS — BP 142/82 | HR 80 | Temp 97.6°F | Resp 14 | Ht 68.0 in | Wt 170.0 lb

## 2014-04-10 DIAGNOSIS — N400 Enlarged prostate without lower urinary tract symptoms: Secondary | ICD-10-CM

## 2014-04-10 DIAGNOSIS — M1711 Unilateral primary osteoarthritis, right knee: Secondary | ICD-10-CM

## 2014-04-10 DIAGNOSIS — D500 Iron deficiency anemia secondary to blood loss (chronic): Secondary | ICD-10-CM

## 2014-04-10 DIAGNOSIS — R269 Unspecified abnormalities of gait and mobility: Secondary | ICD-10-CM

## 2014-04-10 DIAGNOSIS — D509 Iron deficiency anemia, unspecified: Secondary | ICD-10-CM

## 2014-04-10 DIAGNOSIS — M2351 Chronic instability of knee, right knee: Secondary | ICD-10-CM

## 2014-04-10 LAB — CBC
HCT: 41.5 % (ref 39.0–52.0)
Hemoglobin: 13.7 g/dL (ref 13.0–17.0)
MCHC: 33 g/dL (ref 30.0–36.0)
MCV: 99.7 fl (ref 78.0–100.0)
Platelets: 204 10*3/uL (ref 150.0–400.0)
RBC: 4.17 Mil/uL — ABNORMAL LOW (ref 4.22–5.81)
RDW: 15.9 % — AB (ref 11.5–15.5)
WBC: 4.9 10*3/uL (ref 4.0–10.5)

## 2014-04-10 MED ORDER — TAMSULOSIN HCL 0.4 MG PO CAPS: 0.4000 mg | ORAL_CAPSULE | Freq: Every day | ORAL | Status: DC

## 2014-04-10 NOTE — Patient Instructions (Addendum)
We will have you stop taking the cardura for your prostate (urination). We will send in a medicine called flomax that is once a day to see if this helps better with your prostate and urination. Call us back in 1-2 weeks and if we need to change or increase the dose we can do that then.  We will send you to a knee doctor so they can see if we need to do anything to help with your knee giving out.   We are going to check your iron levels today and will call you back with the results.   Come back in about 6 months for a check up.  Keep working on exercising regularly.

## 2014-04-10 NOTE — Progress Notes (Signed)
Pre visit review using our clinic review tool, if applicable. No additional management support is needed unless otherwise documented below in the visit note. 

## 2014-04-11 NOTE — Assessment & Plan Note (Signed)
Refer to orthopedic surgery to see if any brace or procedure that may help with his instability.

## 2014-04-11 NOTE — Assessment & Plan Note (Signed)
Gait improved overall and did well with PT. He is going to try to work with a Clinical research associate on balance. We talked about tai chi and yoga as ways to work on balance.

## 2014-04-11 NOTE — Progress Notes (Signed)
   Subjective:    Patient ID: Henry Carter, male    DOB: 09/07/1940, 73 y.o.   MRN: [id number]  HPI The patient is a 73 YO man who is coming in today to follow up on his balance. He was doing better but has stopped going to PT because of the cost (45 per visit). He is still having problems with his right knee and it gives out on him at times. He has a brace from the store and he is not sure it is doing well. He has not seen an orthopedic surgeon in some time about it. He is also getting up to urinate several times per night and wishes to change his medication for that. He denies any urinary retention. He denies fevers, chills.   Review of Systems  Constitutional: Positive for activity change. Negative for fever, appetite change, fatigue and unexpected weight change.       Not doing PT anymore  HENT: Negative.   Respiratory: Negative for cough, chest tightness, shortness of breath and wheezing.   Cardiovascular: Negative for chest pain, palpitations and leg swelling.  Gastrointestinal: Negative.   Genitourinary: Positive for frequency and enuresis.  Musculoskeletal: Positive for arthralgias and gait problem. Negative for myalgias.  Neurological: Positive for weakness. Negative for dizziness, light-headedness and headaches.       In right leg      Objective:   Physical Exam  Constitutional: He is oriented to person, place, and time. He appears well-developed and well-nourished.  HENT:  Head: Normocephalic and atraumatic.  Eyes: EOM are normal.  Neck: Normal range of motion.  Cardiovascular: Normal rate and regular rhythm.   Pulmonary/Chest: Effort normal and breath sounds normal.  Abdominal: Soft. Bowel sounds are normal.  Musculoskeletal: He exhibits no edema.  Neurological: He is alert and oriented to person, place, and time. Coordination normal.  gait improved from prior visit  Skin: Skin is warm and dry.  Psychiatric: He has a normal mood and affect.   Filed Vitals:   04/10/14 1001  BP: 142/82  Pulse: 80  Temp: 97.6 F (36.4 C)  TempSrc: Oral  Resp: 14  Height: 5\' 8"  (1.727 m)  Weight: 170 lb (77.111 kg)  SpO2: 96%      Assessment & Plan:

## 2014-04-11 NOTE — Assessment & Plan Note (Signed)
GI has been following and intended recheck this fall so check CBC today. Last several have been normal on iron.

## 2014-04-11 NOTE — Assessment & Plan Note (Signed)
Will change cardura to flomax to see if he gets better relief.

## 2014-04-22 ENCOUNTER — Telehealth: Payer: Self-pay | Admitting: Neurology

## 2014-04-22 ENCOUNTER — Ambulatory Visit: Payer: Commercial Managed Care - HMO | Admitting: Neurology

## 2014-04-22 NOTE — Telephone Encounter (Signed)
Pt called and candceled appt with the answering service on 04-19-14 for his appt on 04-22-14

## 2014-04-23 NOTE — Telephone Encounter (Signed)
Pt left message with answering service to cancel 04/22/14 appt. A no show letter will not be sent / Sherri S.

## 2014-04-25 ENCOUNTER — Encounter (HOSPITAL_COMMUNITY): Payer: Self-pay | Admitting: Internal Medicine

## 2014-05-07 DIAGNOSIS — M25361 Other instability, right knee: Secondary | ICD-10-CM | POA: Diagnosis not present

## 2014-05-07 DIAGNOSIS — M7062 Trochanteric bursitis, left hip: Secondary | ICD-10-CM | POA: Diagnosis not present

## 2014-05-14 ENCOUNTER — Encounter: Payer: Self-pay | Admitting: Internal Medicine

## 2014-05-14 ENCOUNTER — Ambulatory Visit (INDEPENDENT_AMBULATORY_CARE_PROVIDER_SITE_OTHER): Payer: Commercial Managed Care - HMO | Admitting: Internal Medicine

## 2014-05-14 VITALS — BP 118/78 | HR 77 | Temp 97.4°F | Resp 18 | Ht 69.0 in | Wt 168.1 lb

## 2014-05-14 DIAGNOSIS — L0233 Carbuncle of buttock: Secondary | ICD-10-CM | POA: Diagnosis not present

## 2014-05-14 DIAGNOSIS — Z720 Tobacco use: Secondary | ICD-10-CM

## 2014-05-14 DIAGNOSIS — L0232 Furuncle of buttock: Secondary | ICD-10-CM | POA: Diagnosis not present

## 2014-05-14 MED ORDER — DOXYCYCLINE HYCLATE 100 MG PO TABS: 100.0000 mg | ORAL_TABLET | Freq: Two times a day (BID) | ORAL | Status: DC

## 2014-05-14 MED ORDER — VARENICLINE TARTRATE 0.5 MG PO TABS: 0.5000 mg | ORAL_TABLET | Freq: Two times a day (BID) | ORAL | Status: DC

## 2014-05-14 NOTE — Progress Notes (Signed)
Pre visit review using our clinic review tool, if applicable. No additional management support is needed unless otherwise documented below in the visit note. 

## 2014-05-14 NOTE — Patient Instructions (Signed)
We will send in the chantix for you which you can take twice a day. You need to quit smoking within the first month of taking the chantix.   We will also send in an antibiotic to help the boil, called doxycycline which you take 1 pill twice a day for 1 week.   We are also sending the referral so you can get the cyst removed.   Smoking Cessation, Tips for Success If you are ready to quit smoking, congratulations! You have chosen to help yourself be healthier. Cigarettes bring nicotine, tar, carbon monoxide, and other irritants into your body. Your lungs, heart, and blood vessels will be able to work better without these poisons. There are many different ways to quit smoking. Nicotine gum, nicotine patches, a nicotine inhaler, or nicotine nasal spray can help with physical craving. Hypnosis, support groups, and medicines help break the habit of smoking. WHAT THINGS CAN I DO TO MAKE QUITTING EASIER?  Here are some tips to help you quit for good:  Pick a date when you will quit smoking completely. Tell all of your friends and family about your plan to quit on that date.  Do not try to slowly cut down on the number of cigarettes you are smoking. Pick a quit date and quit smoking completely starting on that day.  Throw away all cigarettes.   Clean and remove all ashtrays from your home, work, and car.  On a card, write down your reasons for quitting. Carry the card with you and read it when you get the urge to smoke.  Cleanse your body of nicotine. Drink enough water and fluids to keep your urine clear or pale yellow. Do this after quitting to flush the nicotine from your body.  Learn to predict your moods. Do not let a bad situation be your excuse to have a cigarette. Some situations in your life might tempt you into wanting a cigarette.  Never have "just one" cigarette. It leads to wanting another and another. Remind yourself of your decision to quit.  Change habits associated with smoking.  If you smoked while driving or when feeling stressed, try other activities to replace smoking. Stand up when drinking your coffee. Brush your teeth after eating. Sit in a different chair when you read the paper. Avoid alcohol while trying to quit, and try to drink fewer caffeinated beverages. Alcohol and caffeine may urge you to smoke.  Avoid foods and drinks that can trigger a desire to smoke, such as sugary or spicy foods and alcohol.  Ask people who smoke not to smoke around you.  Have something planned to do right after eating or having a cup of coffee. For example, plan to take a walk or exercise.  Try a relaxation exercise to calm you down and decrease your stress. Remember, you may be tense and nervous for the first 2 weeks after you quit, but this will pass.  Find new activities to keep your hands busy. Play with a pen, coin, or rubber band. Doodle or draw things on paper.  Brush your teeth right after eating. This will help cut down on the craving for the taste of tobacco after meals. You can also try mouthwash.   Use oral substitutes in place of cigarettes. Try using lemon drops, carrots, cinnamon sticks, or chewing gum. Keep them handy so they are available when you have the urge to smoke.  When you have the urge to smoke, try deep breathing.  Designate your home as  a nonsmoking area.  If you are a heavy smoker, ask your health care provider about a prescription for nicotine chewing gum. It can ease your withdrawal from nicotine.  Reward yourself. Set aside the cigarette money you save and buy yourself something nice.  Look for support from others. Join a support group or smoking cessation program. Ask someone at home or at work to help you with your plan to quit smoking.  Always ask yourself, "Do I need this cigarette or is this just a reflex?" Tell yourself, "Today, I choose not to smoke," or "I do not want to smoke." You are reminding yourself of your decision to quit.  Do  not replace cigarette smoking with electronic cigarettes (commonly called e-cigarettes). The safety of e-cigarettes is unknown, and some may contain harmful chemicals.  If you relapse, do not give up! Plan ahead and think about what you will do the next time you get the urge to smoke. HOW WILL I FEEL WHEN I QUIT SMOKING? You may have symptoms of withdrawal because your body is used to nicotine (the addictive substance in cigarettes). You may crave cigarettes, be irritable, feel very hungry, cough often, get headaches, or have difficulty concentrating. The withdrawal symptoms are only temporary. They are strongest when you first quit but will go away within 10-14 days. When withdrawal symptoms occur, stay in control. Think about your reasons for quitting. Remind yourself that these are signs that your body is healing and getting used to being without cigarettes. Remember that withdrawal symptoms are easier to treat than the major diseases that smoking can cause.  Even after the withdrawal is over, expect periodic urges to smoke. However, these cravings are generally short lived and will go away whether you smoke or not. Do not smoke! WHAT RESOURCES ARE AVAILABLE TO HELP ME QUIT SMOKING? Your health care provider can direct you to community resources or hospitals for support, which may include:  Group support.  Education.  Hypnosis.  Therapy. Document Released: 12/26/2003 Document Revised: 08/13/2013 Document Reviewed: 09/14/2012 [facility] Patient Information 2015 [place], [place]. This information is not intended to replace advice given to you by your health care provider. Make sure you discuss any questions you have with your health care provider.

## 2014-05-16 DIAGNOSIS — L0232 Furuncle of buttock: Secondary | ICD-10-CM | POA: Insufficient documentation

## 2014-05-16 DIAGNOSIS — Z720 Tobacco use: Secondary | ICD-10-CM | POA: Insufficient documentation

## 2014-05-16 NOTE — Assessment & Plan Note (Signed)
Rx for chantix so he can quit smoking. Given tips and strategies for success and reminded about the risks from tobacco smoke.

## 2014-05-16 NOTE — Progress Notes (Signed)
   Subjective:    Patient ID: Laken Lobato, male    DOB: 1940-12-28, 74 y.o.   MRN: [id number]  HPI The patient is a 74 YO man with 2 complaints today: he wants to stop smoking and has a recurrent boil on his buttock. He noticed that it came up about 3 days ago and now the pain is 6/10. He is unable to sit up as it causes him immense pain. He denies associated nausea, fever, chills. He has had this before 1 time and took antibiotics. Now he wants it out since it has come back in the same spot. He wants to quit smoking and has a friend that has tried chantix and he wants to use it. He has tried it and quit for 7 years before but went back to smoking out of stress.   Review of Systems  Constitutional: Negative for fever, activity change, appetite change, fatigue and unexpected weight change.  HENT: Negative.   Respiratory: Negative for cough, chest tightness, shortness of breath and wheezing.   Cardiovascular: Negative for chest pain, palpitations and leg swelling.  Gastrointestinal: Negative.   Musculoskeletal: Negative for myalgias.  Skin: Positive for color change and wound.  Neurological: Negative for dizziness, light-headedness and headaches.       In right leg  Psychiatric/Behavioral: Negative.       Objective:   Physical Exam  Constitutional: He is oriented to person, place, and time. He appears well-developed and well-nourished.  HENT:  Head: Normocephalic and atraumatic.  Eyes: EOM are normal.  Neck: Normal range of motion.  Cardiovascular: Normal rate and regular rhythm.   Pulmonary/Chest: Effort normal and breath sounds normal.  Abdominal: Soft. Bowel sounds are normal.  Genitourinary:  Boil on buttock, right cheek. Hard with minimal fluctuance  Musculoskeletal: He exhibits no edema.  Neurological: He is alert and oriented to person, place, and time. Coordination normal.  Skin: Skin is warm and dry.  Psychiatric: He has a normal mood and affect.   Filed Vitals:   05/14/14 1104  BP: 118/78  Pulse: 77  Temp: 97.4 F (36.3 C)  TempSrc: Oral  Resp: 18  Height: 5\' 9"  (1.753 m)  Weight: 168 lb 1.9 oz (76.259 kg)  SpO2: 97%      Assessment & Plan:

## 2014-05-16 NOTE — Assessment & Plan Note (Signed)
Rx for doxycycline 100 mg BID for 7 days and referral to surgery for removal of cyst so he can stop having recurrence. No indication for I and D today, hard and not fluctuant

## 2014-05-27 ENCOUNTER — Telehealth: Payer: Self-pay | Admitting: Internal Medicine

## 2014-05-27 MED ORDER — FLUCONAZOLE 150 MG PO TABS: 150.0000 mg | ORAL_TABLET | Freq: Once | ORAL | Status: DC

## 2014-05-27 NOTE — Telephone Encounter (Signed)
Left message for patient to call me back. 

## 2014-05-27 NOTE — Telephone Encounter (Signed)
Have sent in diflucan. Take 1 pill once will clear up the yeast infection.

## 2014-05-27 NOTE — Telephone Encounter (Signed)
Pt called in said he was here last week and Dr Doug Sou gave him Antibiotic and now he has a yeast infection.  He wanted to know if should call call something into [facility]?

## 2014-05-27 NOTE — Telephone Encounter (Signed)
Pt called back in, informed pt that meds have been called in.

## 2014-05-27 NOTE — Telephone Encounter (Signed)
Patient aware.

## 2014-05-27 NOTE — Telephone Encounter (Signed)
Please advise, thanks.

## 2014-07-02 DIAGNOSIS — D6959 Other secondary thrombocytopenia: Secondary | ICD-10-CM | POA: Diagnosis not present

## 2014-07-02 DIAGNOSIS — T45515A Adverse effect of anticoagulants, initial encounter: Secondary | ICD-10-CM | POA: Diagnosis not present

## 2014-07-02 DIAGNOSIS — L988 Other specified disorders of the skin and subcutaneous tissue: Secondary | ICD-10-CM | POA: Diagnosis not present

## 2014-07-02 DIAGNOSIS — B369 Superficial mycosis, unspecified: Secondary | ICD-10-CM | POA: Diagnosis not present

## 2014-07-02 DIAGNOSIS — I10 Essential (primary) hypertension: Secondary | ICD-10-CM | POA: Diagnosis not present

## 2014-07-02 DIAGNOSIS — I739 Peripheral vascular disease, unspecified: Secondary | ICD-10-CM | POA: Diagnosis not present

## 2014-07-25 ENCOUNTER — Ambulatory Visit (INDEPENDENT_AMBULATORY_CARE_PROVIDER_SITE_OTHER): Payer: Commercial Managed Care - HMO

## 2014-07-25 VITALS — BP 126/80 | HR 69 | Ht 69.0 in | Wt 176.5 lb

## 2014-07-25 DIAGNOSIS — Z0000 Encounter for general adult medical examination without abnormal findings: Secondary | ICD-10-CM | POA: Diagnosis not present

## 2014-07-25 NOTE — Patient Instructions (Addendum)
Mr. Henry Carter , Thank you for taking time to come for your Medicare Wellness Visit. I appreciate your ongoing commitment to your health goals. Please review the following plan we discussed and let me know if I can assist you in the future.  To note;  Will discuss need for handicapped with Dr. Doug Sou; runs out in June Also will schedule eye exam soon  Shingles : patient to check with insurance regarding coverage and then will take as directed  These are the goals we discussed: Goals    . Exercise 3x per week (30 min per time)     Wants to get back on the golf;  As much as possible; If right hip and leg is better; will play 3 times per week       This is a list of the screening recommended for you and due dates:  Health Maintenance  Topic Date Due  . Shingles Vaccine  06/25/2001  . Flu Shot  11/11/2014  . Tetanus Vaccine  07/08/2017  . Colon Cancer Screening  09/23/2022  . Pneumonia vaccines  Completed     Fall Prevention and Home Safety Falls cause injuries and can affect all age groups. It is possible to prevent falls.  HOW TO PREVENT FALLS  Wear shoes with rubber soles that do not have an opening for your toes.  Keep the inside and outside of your house well lit.  Use night lights throughout your home.  Remove clutter from floors.  Clean up floor spills.  Remove throw rugs or fasten them to the floor with carpet tape.  Do not place electrical cords across pathways.  Put grab bars by your tub, shower, and toilet. Do not use towel bars as grab bars.  Put handrails on both sides of the stairway. Fix loose handrails.  Do not climb on stools or stepladders, if possible.  Do not wax your floors.  Repair uneven or unsafe sidewalks, walkways, or stairs.  Keep items you use a lot within reach.  Be aware of pets.  Keep emergency numbers next to the telephone.  Put smoke detectors in your home and near bedrooms. Ask your doctor what other things you can do to  prevent falls. Document Released: 01/23/2009 Document Revised: 09/28/2011 Document Reviewed: 06/29/2011 [facility] Patient Information 2015 [place], [place]. This information is not intended to replace advice given to you by your health care provider. Make sure you discuss any questions you have with your health care provider.  Health Maintenance A healthy lifestyle and preventative care can promote health and wellness.  Maintain regular health, dental, and eye exams.  Eat a healthy diet. Foods like vegetables, fruits, whole grains, low-fat dairy products, and lean protein foods contain the nutrients you need and are low in calories. Decrease your intake of foods high in solid fats, added sugars, and salt. Get information about a proper diet from your health care provider, if necessary.  Regular physical exercise is one of the most important things you can do for your health. Most adults should get at least 150 minutes of moderate-intensity exercise (any activity that increases your heart rate and causes you to sweat) each week. In addition, most adults need muscle-strengthening exercises on 2 or more days a week.   Maintain a healthy weight. The body mass index (BMI) is a screening tool to identify possible weight problems. It provides an estimate of body fat based on height and weight. Your health care provider can find your BMI and can help you  achieve or maintain a healthy weight. For males 20 years and older:  A BMI below 18.5 is considered underweight.  A BMI of 18.5 to 24.9 is normal.  A BMI of 25 to 29.9 is considered overweight.  A BMI of 30 and above is considered obese.  Maintain normal blood lipids and cholesterol by exercising and minimizing your intake of saturated fat. Eat a balanced diet with plenty of fruits and vegetables. Blood tests for lipids and cholesterol should begin at age 34 and be repeated every 5 years. If your lipid or cholesterol levels are high, you are over age  56, or you are at high risk for heart disease, you may need your cholesterol levels checked more frequently.Ongoing high lipid and cholesterol levels should be treated with medicines if diet and exercise are not working.  If you smoke, find out from your health care provider how to quit. If you do not use tobacco, do not start.  Lung cancer screening is recommended for adults aged 7-80 years who are at high risk for developing lung cancer because of a history of smoking. A yearly low-dose CT scan of the lungs is recommended for people who have at least a 30-pack-year history of smoking and are current smokers or have quit within the past 15 years. A pack year of smoking is smoking an average of 1 pack of cigarettes a day for 1 year (for example, a 30-pack-year history of smoking could mean smoking 1 pack a day for 30 years or 2 packs a day for 15 years). Yearly screening should continue until the smoker has stopped smoking for at least 15 years. Yearly screening should be stopped for people who develop a health problem that would prevent them from having lung cancer treatment.  If you choose to drink alcohol, do not have more than 2 drinks per day. One drink is considered to be 12 oz (360 mL) of beer, 5 oz (150 mL) of wine, or 1.5 oz (45 mL) of liquor.  Avoid the use of street drugs. Do not share needles with anyone. Ask for help if you need support or instructions about stopping the use of drugs.  High blood pressure causes heart disease and increases the risk of stroke. Blood pressure should be checked at least every 1-2 years. Ongoing high blood pressure should be treated with medicines if weight loss and exercise are not effective.  If you are 97-74 years old, ask your health care provider if you should take aspirin to prevent heart disease.  Diabetes screening involves taking a blood sample to check your fasting blood sugar level. This should be done once every 3 years after age 26 if you are at  a normal weight and without risk factors for diabetes. Testing should be considered at a younger age or be carried out more frequently if you are overweight and have at least 1 risk factor for diabetes.  Colorectal cancer can be detected and often prevented. Most routine colorectal cancer screening begins at the age of 29 and continues through age 108. However, your health care provider may recommend screening at an earlier age if you have risk factors for colon cancer. On a yearly basis, your health care provider may provide home test kits to check for hidden blood in the stool. A small camera at the end of a tube may be used to directly examine the colon (sigmoidoscopy or colonoscopy) to detect the earliest forms of colorectal cancer. Talk to your health care provider  about this at age 24 when routine screening begins. A direct exam of the colon should be repeated every 5-10 years through age 75, unless early forms of precancerous polyps or small growths are found.  People who are at an increased risk for hepatitis B should be screened for this virus. You are considered at high risk for hepatitis B if:  You were born in a country where hepatitis B occurs often. Talk with your health care provider about which countries are considered high risk.  Your parents were born in a high-risk country and you have not received a shot to protect against hepatitis B (hepatitis B vaccine).  You have HIV or AIDS.  You use needles to inject street drugs.  You live with, or have sex with, someone who has hepatitis B.  You are a man who has sex with other men (MSM).  You get hemodialysis treatment.  You take certain medicines for conditions like cancer, organ transplantation, and autoimmune conditions.  Hepatitis C blood testing is recommended for all people born from [birth year] through 1964 and any individual with known risk factors for hepatitis C.  Healthy men should no longer receive prostate-specific antigen  (PSA) blood tests as part of routine cancer screening. Talk to your health care provider about prostate cancer screening.  Testicular cancer screening is not recommended for adolescents or adult males who have no symptoms. Screening includes self-exam, a health care provider exam, and other screening tests. Consult with your health care provider about any symptoms you have or any concerns you have about testicular cancer.  Practice safe sex. Use condoms and avoid high-risk sexual practices to reduce the spread of sexually transmitted infections (STIs).  You should be screened for STIs, including gonorrhea and chlamydia if:  You are sexually active and are younger than 24 years.  You are older than 24 years, and your health care provider tells you that you are at risk for this type of infection.  Your sexual activity has changed since you were last screened, and you are at an increased risk for chlamydia or gonorrhea. Ask your health care provider if you are at risk.  If you are at risk of being infected with HIV, it is recommended that you take a prescription medicine daily to prevent HIV infection. This is called pre-exposure prophylaxis (PrEP). You are considered at risk if:  You are a man who has sex with other men (MSM).  You are a heterosexual man who is sexually active with multiple partners.  You take drugs by injection.  You are sexually active with a partner who has HIV.  Talk with your health care provider about whether you are at high risk of being infected with HIV. If you choose to begin PrEP, you should first be tested for HIV. You should then be tested every 3 months for as long as you are taking PrEP.  Use sunscreen. Apply sunscreen liberally and repeatedly throughout the day. You should seek shade when your shadow is shorter than you. Protect yourself by wearing long sleeves, pants, a wide-brimmed hat, and sunglasses year round whenever you are outdoors.  Tell your health  care provider of new moles or changes in moles, especially if there is a change in shape or color. Also, tell your health care provider if a mole is larger than the size of a pencil eraser.  A one-time screening for abdominal aortic aneurysm (AAA) and surgical repair of large AAAs by ultrasound is recommended for men aged  65-75 years who are current or former smokers.  Stay current with your vaccines (immunizations). Document Released: 09/25/2007 Document Revised: 04/03/2013 Document Reviewed: 08/24/2010 [facility] Patient Information 2015 [place], [place]. This information is not intended to replace advice given to you by your health care provider. Make sure you discuss any questions you have with your health care provider.

## 2014-07-25 NOTE — Progress Notes (Signed)
Subjective:   Henry Carter is a 74 y.o. male who presents for Medicare Annual\  Fall Risk and general Safety reviewed Assess fear of falling: no Educated on prevention falls; Exercise, toning and strengthening; Balance exercises  Comfortable shoes Regular vision checks Home safety; removal of throw rugs; bathroom handrails; Unlevel surfaces outside; stumps;  Avoiding ice; climbing on ladders Getting up and down slowly  Calcium and Vit D as appropriate Any changes in medication   Other Safety Review Firearm safety as appropriate;  Assessed for community safety; feels safe Emergency Plan for illness or other Driving  Sun protection  TIME WARNER CABLE; HAS SET UP EMERGENCY HELP VIA PHONE  Smokes "blunt" 3 to 4 times a day to relax. Denies depression. Wants to try Fipo-flavnoid for tinnitis and will discuss with pharmacist as this is otc med    Cardiac Risk Factors include: hypertension;advanced age (>28men, >7 women);smoking/ tobacco exposure;dyslipidemia     Objective:    Vitals: BP 126/80 mmHg  Pulse 69  Ht 5\' 9"  (1.753 m)  Wt 176 lb 8 oz (80.06 kg)  BMI 26.05 kg/m2  SpO2 98%  Tobacco History  Smoking status  . Former Smoker -- 0.25 packs/day  . Types: Cigarettes  . Start date: 04/12/1957  . Quit date: 04/12/2014  Smokeless tobacco  . Never Used    Comment: using chatix; stopped now; took x 1 week / had stopped 3 years prior to starting back in 2005     Counseling given: Not Answered   Past Medical History  Diagnosis Date  . Peripheral vascular disease, unspecified   . Angioedema   . Essential hypertension   . Hyperlipidemia   . Personal history of tobacco use, presenting hazards to health   . IBS (irritable bowel syndrome)   . BPH (benign prostatic hyperplasia)   . Anemia   . Impotence   . Personal history of colonic adenoma 09/22/2012  . Helicobacter pylori gastritis 10/10/2012  .  iron deficiency 01/18/2013  . COPD (chronic obstructive  pulmonary disease)     STates he has copd  . GERD (gastroesophageal reflux disease)    Past Surgical History  Procedure Laterality Date  . Abdominal surg for ulcers'    . Distal aortogram    . Percutaneous transluminal angionplasty with placement of 2 self expanding stent in the distal left superfical femeral artery    . Colonoscopy N/A 09/22/2012    Procedure: COLONOSCOPY;  Surgeon: Gatha Mayer, MD;  Location: [facility];  Service: Endoscopy;  Laterality: N/A;   Family History  Problem Relation Age of Onset  . Alcohol abuse Father   . Kidney disease Brother   . Drug abuse Brother   . Colon cancer Neg Hx    History  Sexual Activity  . Sexual Activity: Not on file    Comment: [place]    Outpatient Encounter Prescriptions as of 07/25/2014  Medication Sig  . albuterol (PROVENTIL HFA;VENTOLIN HFA) 108 (90 BASE) MCG/ACT inhaler Inhale 2 puffs into the lungs every 6 (six) hours as needed for wheezing.  Marland Kitchen aspirin 81 MG tablet Take 81 mg by mouth daily.    . clopidogrel (PLAVIX) 75 MG tablet Take 75 mg by mouth daily.   Marland Kitchen doxycycline (VIBRA-TABS) 100 MG tablet Take 1 tablet (100 mg total) by mouth 2 (two) times daily.  Marland Kitchen esomeprazole (NEXIUM) 40 MG capsule Take 1 capsule (40 mg total) by mouth daily.  Marland Kitchen gabapentin (NEURONTIN) 300 MG capsule TAKE 1 CAPSULE BY MOUTH  THREE TIMES DAILY  . tamsulosin (FLOMAX) 0.4 MG CAPS capsule Take 1 capsule (0.4 mg total) by mouth daily.  . traMADol (ULTRAM) 50 MG tablet Take 1 tablet (50 mg total) by mouth every 6 (six) hours as needed.  . fluconazole (DIFLUCAN) 150 MG tablet Take 1 tablet (150 mg total) by mouth once. (Patient not taking: Reported on 07/25/2014)  . varenicline (CHANTIX) 0.5 MG tablet Take 1 tablet (0.5 mg total) by mouth 2 (two) times daily. (Patient not taking: Reported on 07/25/2014)    Activities of Daily Living In your present state of health, do you have any difficulty performing the following activities: 07/25/2014  Hearing?  N  Vision? N  Difficulty concentrating or making decisions? Y  Walking or climbing stairs? Y  Dressing or bathing? N  Doing errands, shopping? N  Preparing Food and eating ? N  Using the Toilet? N  In the past six months, have you accidently leaked urine? Y  Do you have problems with loss of bowel control? N  Managing your Medications? Y  Managing your Finances? N  Housekeeping or managing your Housekeeping? N    Patient Care Team: Olga Millers, MD as PCP - General (Internal Medicine) Rigoberto Noel, MD (Pulmonary Disease) Rosetta Posner, MD (Vascular Surgery) Burnell Blanks, MD (Cardiology)   Assessment:     Exercise Activities and Dietary recommendations Current Exercise Habits:: Exercise is limited by, Limited by:: orthopedic condition(s)  Goals    . Exercise 3x per week (30 min per time)     Wants to get back on the golf;  As much as possible; If right hip and leg is better; will play 3 times per week      Fall Risk Fall Risk  07/25/2014 07/25/2014 11/26/2013  Falls in the past year? - Yes No  Number falls in past yr: - 1 -  Injury with Fall? - No -  Risk for fall due to : Impaired mobility Other (Comment) -  Risk for fall due to (comments): has pain in right hip; can't do a lot of walking; to discuss with doctor; declines steriod shots/ somet times has to stop when walking Educated fall risk and getting up slow; concious of where feet are;  -   Depression Screen PHQ 2/9 Scores 07/25/2014 11/26/2013  PHQ - 2 Score 0 0    Cognitive Testing MMSE - Mini Mental State Exam 07/25/2014  Orientation to time 5  Orientation to Place 5  Registration 3  Attention/ Calculation 1  Recall 2  Language- name 2 objects 2  Language- repeat 1  Language- follow 3 step command 3  Language- read & follow direction 1  Write a sentence 1  Copy design 1  Total score 25    Immunization History  Administered Date(s) Administered  . Influenza Split 01/05/2012, 01/01/2013    . Influenza,inj,Quad PF,36+ Mos 12/21/2013  . PPD Test 09/11/2012  . Pneumococcal Conjugate-13 02/06/2013  . Pneumococcal Polysaccharide-23 04/17/2013  . Td 07/09/2007   Screening Tests Health Maintenance  Topic Date Due  . ZOSTAVAX  06/25/2001  . INFLUENZA VACCINE  11/11/2014  . TETANUS/TDAP  07/08/2017  . COLONOSCOPY  09/23/2022  . PNA vac Low Risk Adult  Completed      Plan:    During the course of the visit the patient was educated and counseled about the following appropriate screening and preventive services: Patient given information   Vaccines to include Pneumoccal, Influenza, Hepatitis B, Td, Zostavax  Electrocardiogram  Cardiovascular Disease  Colorectal cancer screening  Diabetes screening  Prostate Cancer Screening  Glaucoma screening  Nutrition counseling   Smoking cessation counseling  Patient Instructions (the written plan) was given to the patient.    Wynetta Fines, RN  07/25/2014

## 2014-07-31 ENCOUNTER — Telehealth: Payer: Self-pay

## 2014-07-31 NOTE — Telephone Encounter (Signed)
Dr.Kollar signed Handicapped sticker form and copy was scanned to chart Original mailed to patient at the address given

## 2014-08-02 ENCOUNTER — Other Ambulatory Visit: Payer: Self-pay

## 2014-08-02 MED ORDER — GABAPENTIN 300 MG PO CAPS: 300.0000 mg | ORAL_CAPSULE | Freq: Three times a day (TID) | ORAL | Status: DC

## 2014-08-09 ENCOUNTER — Other Ambulatory Visit: Payer: Self-pay

## 2014-08-09 MED ORDER — CLOPIDOGREL BISULFATE 75 MG PO TABS: 75.0000 mg | ORAL_TABLET | Freq: Every day | ORAL | Status: DC

## 2014-08-28 DIAGNOSIS — Z0279 Encounter for issue of other medical certificate: Secondary | ICD-10-CM

## 2014-10-10 ENCOUNTER — Other Ambulatory Visit (INDEPENDENT_AMBULATORY_CARE_PROVIDER_SITE_OTHER): Payer: Commercial Managed Care - HMO

## 2014-10-10 ENCOUNTER — Encounter: Payer: Self-pay | Admitting: Internal Medicine

## 2014-10-10 ENCOUNTER — Ambulatory Visit (INDEPENDENT_AMBULATORY_CARE_PROVIDER_SITE_OTHER): Payer: Commercial Managed Care - HMO | Admitting: Internal Medicine

## 2014-10-10 VITALS — BP 140/78 | HR 54 | Temp 98.0°F | Resp 18 | Ht 69.0 in | Wt 167.1 lb

## 2014-10-10 DIAGNOSIS — L0233 Carbuncle of buttock: Secondary | ICD-10-CM

## 2014-10-10 DIAGNOSIS — R5383 Other fatigue: Secondary | ICD-10-CM

## 2014-10-10 DIAGNOSIS — Z720 Tobacco use: Secondary | ICD-10-CM | POA: Diagnosis not present

## 2014-10-10 DIAGNOSIS — N400 Enlarged prostate without lower urinary tract symptoms: Secondary | ICD-10-CM | POA: Diagnosis not present

## 2014-10-10 DIAGNOSIS — L0232 Furuncle of buttock: Secondary | ICD-10-CM

## 2014-10-10 LAB — CBC
HCT: 45.7 % (ref 39.0–52.0)
Hemoglobin: 15 g/dL (ref 13.0–17.0)
MCHC: 32.9 g/dL (ref 30.0–36.0)
MCV: 97.8 fl (ref 78.0–100.0)
PLATELETS: 219 10*3/uL (ref 150.0–400.0)
RBC: 4.67 Mil/uL (ref 4.22–5.81)
RDW: 15.7 % — ABNORMAL HIGH (ref 11.5–15.5)
WBC: 5.4 10*3/uL (ref 4.0–10.5)

## 2014-10-10 LAB — FERRITIN: Ferritin: 35 ng/mL (ref 22.0–322.0)

## 2014-10-10 LAB — TSH: TSH: 1.34 u[IU]/mL (ref 0.35–4.50)

## 2014-10-10 NOTE — Patient Instructions (Signed)
We will check the blood work today and call you back with the results. You can start taking the iron pills again.   We have given you the doughnut pillow to take pressure off the middle so the place can finish healing. It is healing and does not look infected today.

## 2014-10-10 NOTE — Assessment & Plan Note (Signed)
-

## 2014-10-10 NOTE — Progress Notes (Signed)
   Subjective:    Patient ID: Henry Carter, male    DOB: July 06, 1940, 74 y.o.   MRN: [id number]  HPI The patient is a 74 YO man coming in for follow up of his abscess on his buttock. He feels that it is still draining at times. Not healed. No fevers or chills. No pus discharging mostly some clear fluid. Not draining out stool and not connected to his anus. Some pain when sitting on it.   Review of Systems  Constitutional: Negative for fever, activity change, appetite change, fatigue and unexpected weight change.  Respiratory: Negative for cough, chest tightness, shortness of breath and wheezing.   Cardiovascular: Negative for chest pain, palpitations and leg swelling.  Gastrointestinal: Negative.   Musculoskeletal: Negative for myalgias.  Skin: Positive for color change and wound.       On buttock  Neurological: Negative for dizziness, light-headedness and headaches.      Objective:   Physical Exam  Constitutional: He is oriented to person, place, and time. He appears well-developed and well-nourished.  HENT:  Head: Normocephalic and atraumatic.  Eyes: EOM are normal.  Neck: Normal range of motion.  Cardiovascular: Normal rate and regular rhythm.   Pulmonary/Chest: Effort normal and breath sounds normal.  Abdominal: Soft. Bowel sounds are normal.  Genitourinary:  Sore on buttock at gluteal crest, no fluctuance underneath and pink healing tissue, no signs of infection, appears healing.   Musculoskeletal: He exhibits no edema.  Neurological: He is alert and oriented to person, place, and time. Coordination normal.  Skin: Skin is warm and dry.  Psychiatric: He has a normal mood and affect.   Filed Vitals:   10/10/14 1004  BP: 140/78  Pulse: 54  Temp: 98 F (36.7 C)  TempSrc: Oral  Resp: 18  Height: 5\' 9"  (1.753 m)  Weight: 167 lb 1.9 oz (75.805 kg)  SpO2: 98%      Assessment & Plan:

## 2014-10-10 NOTE — Assessment & Plan Note (Signed)
Appears to be healing, advised he can sit on doughnut pillow to take pressure off when driving. Suspect that the tissue is pulling apart while driving and keeping from fully healing. Overall improved and abscess is gone. No indication for antibiotics today.

## 2014-10-10 NOTE — Assessment & Plan Note (Signed)
Still not smoking and counseled not to resume. Congratulated him on almost 6 months smoke free.

## 2014-10-10 NOTE — Progress Notes (Signed)
Pre visit review using our clinic review tool, if applicable. No additional management support is needed unless otherwise documented below in the visit note. 

## 2014-12-20 ENCOUNTER — Encounter: Payer: Self-pay | Admitting: Internal Medicine

## 2014-12-20 ENCOUNTER — Ambulatory Visit (INDEPENDENT_AMBULATORY_CARE_PROVIDER_SITE_OTHER): Payer: Commercial Managed Care - HMO | Admitting: Internal Medicine

## 2014-12-20 VITALS — BP 148/74 | HR 84 | Temp 98.4°F | Resp 14 | Ht 69.0 in | Wt 165.8 lb

## 2014-12-20 DIAGNOSIS — Z23 Encounter for immunization: Secondary | ICD-10-CM

## 2014-12-20 DIAGNOSIS — L0233 Carbuncle of buttock: Secondary | ICD-10-CM | POA: Diagnosis not present

## 2014-12-20 DIAGNOSIS — L0232 Furuncle of buttock: Secondary | ICD-10-CM

## 2014-12-20 MED ORDER — TRIAMCINOLONE ACETONIDE 0.1 % EX CREA: 1.0000 "application " | TOPICAL_CREAM | Freq: Two times a day (BID) | CUTANEOUS | Status: DC

## 2014-12-20 NOTE — Progress Notes (Signed)
   Subjective:    Patient ID: Henry Carter, male    DOB: 06-14-1940, 74 y.o.   MRN: [id number]  HPI The patient is a 74 YO man coming in with non-healing lesion on the crest of his buttocks. He is trying to keep it clean and no drainage coming out. Occasionally bleeds but no foul discharge. No fevers or chills. No rash on the area. Has not tried anything on the area.   Review of Systems  Constitutional: Negative for fever, activity change, appetite change, fatigue and unexpected weight change.  Respiratory: Negative for cough, chest tightness, shortness of breath and wheezing.   Cardiovascular: Negative for chest pain, palpitations and leg swelling.  Gastrointestinal: Negative.   Musculoskeletal: Negative for myalgias.  Skin: Positive for color change and wound.       On buttock  Neurological: Negative for dizziness, light-headedness and headaches.      Objective:   Physical Exam  Constitutional: He is oriented to person, place, and time. He appears well-developed and well-nourished.  HENT:  Head: Normocephalic and atraumatic.  Eyes: EOM are normal.  Neck: Normal range of motion.  Cardiovascular: Normal rate and regular rhythm.   Pulmonary/Chest: Effort normal and breath sounds normal.  Abdominal: Soft. Bowel sounds are normal.  Genitourinary:  Sore on buttock at gluteal crest, pinpoint opening, smaller than prior, no signs of infection, appears healing.   Musculoskeletal: He exhibits no edema.  Neurological: He is alert and oriented to person, place, and time. Coordination normal.  Skin: Skin is warm and dry.  Psychiatric: He has a normal mood and affect.   Filed Vitals:   12/20/14 1348  BP: 148/74  Pulse: 84  Temp: 98.4 F (36.9 C)  TempSrc: Oral  Resp: 14  Height: 5\' 9"  (1.753 m)  Weight: 165 lb 12.8 oz (75.206 kg)  SpO2: 98%      Assessment & Plan:

## 2014-12-20 NOTE — Patient Instructions (Addendum)
We have sent in a cream called triamcinolone cream. Use it on the boil twice a day for the next 1-2 weeks.   You do not need antibiotics today.   Call us back if this does not heal this up.

## 2014-12-20 NOTE — Progress Notes (Signed)
Pre visit review using our clinic review tool, if applicable. No additional management support is needed unless otherwise documented below in the visit note. 

## 2014-12-21 NOTE — Assessment & Plan Note (Addendum)
Still healing, much improved. Triamcinolone cream for the area. Advised to keep clean and not use hydrogen peroxide on it anymore. No indication for antibiotics today.

## 2015-01-01 ENCOUNTER — Encounter: Payer: Self-pay | Admitting: Family

## 2015-01-06 ENCOUNTER — Encounter: Payer: Self-pay | Admitting: Family

## 2015-01-06 ENCOUNTER — Ambulatory Visit (HOSPITAL_COMMUNITY)
Admission: RE | Admit: 2015-01-06 | Discharge: 2015-01-06 | Disposition: A | Discharge status: Home / Self Care | Payer: Commercial Managed Care - HMO | Source: Ambulatory Visit | Attending: Family | Admitting: Family

## 2015-01-06 ENCOUNTER — Ambulatory Visit (INDEPENDENT_AMBULATORY_CARE_PROVIDER_SITE_OTHER): Payer: Commercial Managed Care - HMO | Admitting: Family

## 2015-01-06 VITALS — BP 152/79 | HR 58 | Temp 96.9°F | Resp 16 | Ht 69.0 in | Wt 171.0 lb

## 2015-01-06 DIAGNOSIS — I70212 Atherosclerosis of native arteries of extremities with intermittent claudication, left leg: Secondary | ICD-10-CM | POA: Diagnosis not present

## 2015-01-06 DIAGNOSIS — I70211 Atherosclerosis of native arteries of extremities with intermittent claudication, right leg: Secondary | ICD-10-CM | POA: Insufficient documentation

## 2015-01-06 DIAGNOSIS — Z7722 Contact with and (suspected) exposure to environmental tobacco smoke (acute) (chronic): Secondary | ICD-10-CM

## 2015-01-06 DIAGNOSIS — Z9889 Other specified postprocedural states: Secondary | ICD-10-CM

## 2015-01-06 DIAGNOSIS — I70219 Atherosclerosis of native arteries of extremities with intermittent claudication, unspecified extremity: Secondary | ICD-10-CM | POA: Diagnosis not present

## 2015-01-06 DIAGNOSIS — I779 Disorder of arteries and arterioles, unspecified: Secondary | ICD-10-CM

## 2015-01-06 DIAGNOSIS — Z87891 Personal history of nicotine dependence: Secondary | ICD-10-CM | POA: Diagnosis not present

## 2015-01-06 DIAGNOSIS — Z959 Presence of cardiac and vascular implant and graft, unspecified: Secondary | ICD-10-CM

## 2015-01-06 NOTE — Patient Instructions (Addendum)
Peripheral Vascular Disease Peripheral Vascular Disease (PVD), also called Peripheral Arterial Disease (PAD), is a circulation problem caused by cholesterol (atherosclerotic plaque) deposits in the arteries. PVD commonly occurs in the lower extremities (legs) but it can occur in other areas of the body, such as your arms. The cholesterol buildup in the arteries reduces blood flow which can cause pain and other serious problems. The presence of PVD can place a person at risk for Coronary Artery Disease (CAD).  CAUSES  Causes of PVD can be many. It is usually associated with more than one risk factor such as:   High Cholesterol.  Smoking.  Diabetes.  Lack of exercise or inactivity.  High blood pressure (hypertension).  Obesity.  Family history. SYMPTOMS   When the lower extremities are affected, patients with PVD may experience:  Leg pain with exertion or physical activity. This is called INTERMITTENT CLAUDICATION. This may present as cramping or numbness with physical activity. The location of the pain is associated with the level of blockage. For example, blockage at the abdominal level (distal abdominal aorta) may result in buttock or hip pain. Lower leg arterial blockage may result in calf pain.  As PVD becomes more severe, pain can develop with less physical activity.  In people with severe PVD, leg pain may occur at rest.  Other PVD signs and symptoms:  Leg numbness or weakness.  Coldness in the affected leg or foot, especially when compared to the other leg.  A change in leg color.  Patients with significant PVD are more prone to ulcers or sores on toes, feet or legs. These may take longer to heal or may reoccur. The ulcers or sores can become infected.  If signs and symptoms of PVD are ignored, gangrene may occur. This can result in the loss of toes or loss of an entire limb.  Not all leg pain is related to PVD. Other medical conditions can cause leg pain such  as:  Blood clots (embolism) or Deep Vein Thrombosis.  Inflammation of the blood vessels (vasculitis).  Spinal stenosis. DIAGNOSIS  Diagnosis of PVD can involve several different types of tests. These can include:  Pulse Volume Recording Method (PVR). This test is simple, painless and does not involve the use of X-rays. PVR involves measuring and comparing the blood pressure in the arms and legs. An ABI (Ankle-Brachial Index) is calculated. The normal ratio of blood pressures is 1. As this number becomes smaller, it indicates more severe disease.  < 0.95 - indicates significant narrowing in one or more leg vessels.  <0.8 - there will usually be pain in the foot, leg or buttock with exercise.  <0.4 - will usually have pain in the legs at rest.  <0.25 - usually indicates limb threatening PVD.  Doppler detection of pulses in the legs. This test is painless and checks to see if you have a pulses in your legs/feet.  A dye or contrast material (a substance that highlights the blood vessels so they show up on x-ray) may be given to help your caregiver better see the arteries for the following tests. The dye is eliminated from your body by the kidney's. Your caregiver may order blood work to check your kidney function and other laboratory values before the following tests are performed:  Magnetic Resonance Angiography (MRA). An MRA is a picture study of the blood vessels and arteries. The MRA machine uses a large magnet to produce images of the blood vessels.  Computed Tomography Angiography (CTA). A CTA   is a specialized x-ray that looks at how the blood flows in your blood vessels. An IV may be inserted into your arm so contrast dye can be injected.  Angiogram. Is a procedure that uses x-rays to look at your blood vessels. This procedure is minimally invasive, meaning a small incision (cut) is made in your groin. A small tube (catheter) is then inserted into the artery of your groin. The catheter  is guided to the blood vessel or artery your caregiver wants to examine. Contrast dye is injected into the catheter. X-rays are then taken of the blood vessel or artery. After the images are obtained, the catheter is taken out. TREATMENT  Treatment of PVD involves many interventions which may include:  Lifestyle changes:  Quitting smoking.  Exercise.  Following a low fat, low cholesterol diet.  Control of diabetes.  Foot care is very important to the PVD patient. Good foot care can help prevent infection.  Medication:  Cholesterol-lowering medicine.  Blood pressure medicine.  Anti-platelet drugs.  Certain medicines may reduce symptoms of Intermittent Claudication.  Interventional/Surgical options:  Angioplasty. An Angioplasty is a procedure that inflates a balloon in the blocked artery. This opens the blocked artery to improve blood flow.  Stent Implant. A wire mesh tube (stent) is placed in the artery. The stent expands and stays in place, allowing the artery to remain open.  Peripheral Bypass Surgery. This is a surgical procedure that reroutes the blood around a blocked artery to help improve blood flow. This type of procedure may be performed if Angioplasty or stent implants are not an option. SEEK IMMEDIATE MEDICAL CARE IF:   You develop pain or numbness in your arms or legs.  Your arm or leg turns cold, becomes blue in color.  You develop redness, warmth, swelling and pain in your arms or legs. MAKE SURE YOU:   Understand these instructions.  Will watch your condition.  Will get help right away if you are not doing well or get worse. Document Released: 05/06/2004 Document Revised: 06/21/2011 Document Reviewed: 04/02/2008 ExitCare Patient Information 2015 [employer]. This information is not intended to replace advice given to you by your health care provider. Make sure you discuss any questions you have with your health care provider.   Secondhand  Smoke Secondhand smoke is the smoke exhaled by smokers and the smoke given off by a burning cigarette, cigar, or pipe. When a cigarette is smoked, about half of the smoke is inhaled and exhaled by the smoker, and the other half floats around in the air. Exposure to secondhand smoke is also called involuntary smoking or passive smoking. People can be exposed to secondhand smoke in:   Homes.  Cars.  Workplaces.  Public places (bars, restaurants, other recreation sites). Exposure to secondhand smoke is hazardous.It contains more than 250 harmful chemicals, including at least 60 that can cause cancer. These chemicals include:  Arsenic, a heavy metal toxin.  Benzene, a chemical found in gasoline.  Beryllium, a toxic metal.  Cadmium, a metal used in batteries.  Chromium, a metallic element.  Ethylene oxide, a chemical used to sterilize medical devices.  Nickel, a metallic element.  Polonium-210, a chemical element that gives off radiation.  Vinyl chloride, a toxic substance used in the manufacture of plastics. Nonsmoking spouses and family members of smokers have higher rates of cancer, heart disease, and serious respiratory illnesses than those not exposed to secondhand smoke.  Nicotine, a nicotine by-product called cotinine, carbon monoxide, and other evidence of   secondhand smoke exposure have been found in the body fluids of nonsmokers exposed to secondhand smoke.  Living with a smoker may increase a nonsmoker's chances of developing lung cancer by 20 to 30 percent.  Secondhand smoke may increase the risk of breast cancer, nasal sinus cavity cancer, cervical cancer, bladder cancer, and nose and throat (nasopharyngeal) cancer in adults.  Secondhand smoke may increase the risk of heart disease by 25 to 30 percent. Children are especially at risk from secondhand smoke exposure. Children of smokers have higher rates  of:  Pneumonia.  Asthma.  Smoking.  Bronchitis.  Colds.  Chronic cough.  Ear infections.  Tonsilitis.  School absences. Research suggests that exposure to secondhand smoke may cause leukemia, lymphoma, and brain tumors in children. Babies are three times more likely to die from sudden infant death syndrome (SIDS) if their mothers smoked during and after pregnancy. There is no safe level of exposure to secondhand smoke. Studies have shown that even low levels of exposure can be harmful. The only way to fully protect nonsmokers from secondhand smoke exposure is to completely eliminate smoking in indoor spaces. The best thing you can do for your own health and for your children's health is to stop smoking. You should stop as soon as possible. This is not easy, and you may fail several times at quitting before you get free of this addiction. Nicotine replacement therapy ( such as patches, gum, or lozenges) can help. These therapies can help you deal with the physical symptoms of withdrawal. Attending quit-smoking support groups can help you deal with the emotional issues of quitting smoking.  Even if you are not ready to quit right now, there are some simple changes you can make to reduce the effect of your smoking on your family:  Do not smoke in your home. Smoke away from your home in an open area, preferably outside.  Ask others to not smoke in your home.  Do not smoke while holding a child or when children are near.  Do not smoke in your car.  Avoid restaurants, day care centers, and other places that allow smoking. Document Released: 05/06/2004 Document Revised: 12/22/2011 Document Reviewed: 07/13/2013 ExitCare Patient Information 2015 [employer]. This information is not intended to replace advice given to you by your health care provider. Make sure you discuss any questions you have with your health care provider.  

## 2015-01-06 NOTE — Progress Notes (Signed)
[facility] [place] HISTORY AND PHYSICAL -PAD  History of Present Illness Henry Carter is a 74 y.o. male patient of Dr. Trula Slade whom he has seen for bilateral claudication. Pt initially underwent subintimal recanalization with subsequent stenting on 12/18/2008 by Dr. Julianne Handler. Dr. Trula Slade performed a subintimal recanalization with subsequent stenting of the right leg in November of 2010. At that time, the patient was having claudication symptoms at 50 feet without rest pain or ulceration. His symptoms have resolved. The patient returns today for follow up. He does complain of some numbness in his legs. He is taking Neurontin.  Pt states he had a TIA about February 2016 as manifested by left facial numbness and losing his sense of direction when he was driving; this resolved and has not recurred. Pt states he told his PCP about this. Pt states he has had a couple of CT of the head since then.  The patient denies New Medical or Surgical History.  Pt Diabetic: No Pt smoker: former smoker, quit in early 2016, smoked since 1959, stopped for a few years and resumed. He is, however, exposed in his home to his wife's secondhand smoke.   Pt meds include: Statin :No Betablocker: No ASA: Yes Other anticoagulants/antiplatelets: Plavix    Past Medical History  Diagnosis Date  . Peripheral vascular disease, unspecified   . Angioedema   . Essential hypertension   . Hyperlipidemia   . Personal history of tobacco use, presenting hazards to health   . IBS (irritable bowel syndrome)   . BPH (benign prostatic hyperplasia)   . Anemia   . Impotence   . Personal history of colonic adenoma 09/22/2012  . Helicobacter pylori gastritis 10/10/2012  .  iron deficiency 01/18/2013  . COPD (chronic obstructive pulmonary disease)     STates he has copd  . GERD (gastroesophageal reflux disease)     Social History Social History  Substance Use Topics  . Smoking status: Former  Smoker -- 0.25 packs/day    Types: Cigarettes    Start date: 04/12/1957    Quit date: 04/12/2014  . Smokeless tobacco: Never Used     Comment: using chatix; stopped now; took x 1 week / had stopped 3 years prior to starting back in 2005  . Alcohol Use: No     Comment: OCC    Family History Family History  Problem Relation Age of Onset  . Alcohol abuse Father   . Kidney disease Brother   . Drug abuse Brother   . Diabetes Brother   . Colon cancer Neg Hx   . Diabetes Mother   . Diabetes Brother     Past Surgical History  Procedure Laterality Date  . Abdominal surg for ulcers'  1980  . Distal aortogram    . Percutaneous transluminal angionplasty with placement of 2 self expanding stent in the distal left superfical femeral artery    . Colonoscopy N/A 09/22/2012    Procedure: COLONOSCOPY;  Surgeon: Gatha Mayer, MD;  Location: [facility];  Service: Endoscopy;  Laterality: N/A;    Allergies  Allergen Reactions  . Ace Inhibitors     REACTION: antioedema  . Penicillins Hives    Current Outpatient Prescriptions  Medication Sig Dispense Refill  . albuterol (PROVENTIL HFA;VENTOLIN HFA) 108 (90 BASE) MCG/ACT inhaler Inhale 2 puffs into the lungs every 6 (six) hours as needed for wheezing. 1 Inhaler 3  . aspirin 81 MG tablet Take 81 mg by mouth daily.      Marland Kitchen  clopidogrel (PLAVIX) 75 MG tablet Take 1 tablet (75 mg total) by mouth daily. 90 tablet 3  . doxazosin (CARDURA) 4 MG tablet 4 mg as needed.     Marland Kitchen esomeprazole (NEXIUM) 40 MG capsule Take 1 capsule (40 mg total) by mouth daily. 30 capsule 5  . gabapentin (NEURONTIN) 300 MG capsule Take 1 capsule (300 mg total) by mouth 3 (three) times daily. 270 capsule 3  . tamsulosin (FLOMAX) 0.4 MG CAPS capsule Take 1 capsule (0.4 mg total) by mouth daily. 90 capsule 0  . traMADol (ULTRAM) 50 MG tablet Take 1 tablet (50 mg total) by mouth every 6 (six) hours as needed. 10 tablet 0  . triamcinolone cream (KENALOG) 0.1 % Apply 1 application  topically 2 (two) times daily. 30 g 0  . varenicline (CHANTIX) 0.5 MG tablet Take 1 tablet (0.5 mg total) by mouth 2 (two) times daily. 60 tablet 0   No current facility-administered medications for this visit.    ROS: See HPI for pertinent positives and negatives.   Physical Examination  Filed Vitals:   01/06/15 1557 01/06/15 1606  BP: 148/91 152/79  Pulse: 76 58  Temp: 96.9 F (36.1 C)   TempSrc: Oral   Resp: 16   Height: 5\' 9"  (1.753 m)   Weight: 171 lb (77.565 kg)   SpO2: 98%    Body mass index is 25.24 kg/(m^2).  General: A&O x 3, WDWN. Gait: normal Eyes: PERRLA. Pulmonary: CTAB, without wheezes , rales or rhonchi. Cardiac: regular rhythm, no detected murmur.         Carotid Bruits Right Left   Negative Negative  Aorta is faintly palpable. Radial pulses: 2+ palpable and =                           VASCULAR EXAM: Extremities without ischemic changes, without Gangrene; without open wounds.                                                                                                          LE Pulses Right Left       FEMORAL  2+ palpable  2+ palpable        POPLITEAL  not palpable   not palpable       POSTERIOR TIBIAL  not palpable   not palpable        DORSALIS PEDIS      ANTERIOR TIBIAL not palpable  not palpable    Abdomen: soft, NT, no palpable masses. Skin: no rashes, no ulcers. Musculoskeletal: no muscle wasting or atrophy.  Neurologic: A&O X 3; Appropriate Affect; MOTOR FUNCTION:  moving all extremities equally, motor strength 5/5 throughout. Speech is fluent/normal. CN 2-12 intact.    Outside Studies/Documentation: 12/28/13 MR brain: IMPRESSION: No acute brain finding. 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 Mild chronic small-vessel change of the white matter otherwise.  No intracranial occlusion or correctable proximal stenosis.  Question of 3 mm aneurysm is a  at the left posterior communicating artery origin and on the right at the anterior communicating artery region. The vessels are quite tortuous in those areas but I suspect that there are small aneurysms. In a person of this age, these would usually not be treated but only followed.     Non-Invasive Vascular Imaging: DATE: 01/06/2015 ABI (Date: 01/06/2015)  R: 1.01 (1.09, 12/25/13), DP: monophasic, PT: triphasic, TBI: 0.50  L: 1.10 (1.09), DP: monophasic, PT: triphasic, TBI: 0.50     ASSESSMENT: Henry Carter is a 74 y.o. male who is s/p left femoropopliteal stent placed 12/18/08 and right LE stent placed 02/2009. Since these stents were placed he has had no further claudication. He has no signs of ischemia in his lower extremities.  ABI's remain in the normal range with tri and monophasic waveforms. Pt does not have DM and he quit smoking in January this year; however, he is exposed to secondhand smoke in his home from his wife's smoking.   PLAN:  Graduated walking program. Try to persuade his wife to quit smoking or smoke outside the home.  Based on the patient's vascular studies and examination, pt will return to clinic in 1 year with ABI's.   I discussed in depth with the patient the nature of atherosclerosis, and emphasized the importance of maximal medical management including strict control of blood pressure, blood glucose, and lipid levels, obtaining regular exercise, and cessation of smoking.  The patient is aware that without maximal medical management the underlying atherosclerotic disease process will progress, limiting the benefit of any interventions.  The patient was given information about PAD including signs, symptoms, treatment, what symptoms should prompt the patient to seek immediate medical care, and risk reduction measures to take.  Clemon Chambers, RN, MSN, FNP-C [facility] Phone:  [phone]  Clinic MD: Trula Slade  01/06/2015 4:10 PM

## 2015-01-06 NOTE — Progress Notes (Signed)
Filed Vitals:   01/06/15 1557 01/06/15 1606  BP: 148/91 152/79  Pulse: 76 58  Temp: 96.9 F (36.1 C)   TempSrc: Oral   Resp: 16   Height: 5\' 9"  (1.753 m)   Weight: 171 lb (77.565 kg)   SpO2: 98%

## 2015-01-07 NOTE — Addendum Note (Signed)
Addended by: Dorthula Rue L on: 01/07/2015 02:11 PM   Modules accepted: Orders

## 2015-01-13 ENCOUNTER — Emergency Department (HOSPITAL_COMMUNITY)
Admission: EM | Admit: 2015-01-13 | Discharge: 2015-01-13 | Disposition: A | Discharge status: Home / Self Care | Payer: Commercial Managed Care - HMO | Attending: Emergency Medicine | Admitting: Emergency Medicine

## 2015-01-13 ENCOUNTER — Encounter (HOSPITAL_COMMUNITY): Payer: Self-pay | Admitting: Emergency Medicine

## 2015-01-13 ENCOUNTER — Emergency Department (HOSPITAL_COMMUNITY): Payer: Commercial Managed Care - HMO

## 2015-01-13 DIAGNOSIS — Z862 Personal history of diseases of the blood and blood-forming organs and certain disorders involving the immune mechanism: Secondary | ICD-10-CM | POA: Insufficient documentation

## 2015-01-13 DIAGNOSIS — Z7982 Long term (current) use of aspirin: Secondary | ICD-10-CM | POA: Insufficient documentation

## 2015-01-13 DIAGNOSIS — Z7902 Long term (current) use of antithrombotics/antiplatelets: Secondary | ICD-10-CM | POA: Insufficient documentation

## 2015-01-13 DIAGNOSIS — K219 Gastro-esophageal reflux disease without esophagitis: Secondary | ICD-10-CM | POA: Insufficient documentation

## 2015-01-13 DIAGNOSIS — Z79899 Other long term (current) drug therapy: Secondary | ICD-10-CM | POA: Insufficient documentation

## 2015-01-13 DIAGNOSIS — Z87891 Personal history of nicotine dependence: Secondary | ICD-10-CM | POA: Insufficient documentation

## 2015-01-13 DIAGNOSIS — Z8639 Personal history of other endocrine, nutritional and metabolic disease: Secondary | ICD-10-CM | POA: Diagnosis not present

## 2015-01-13 DIAGNOSIS — Z880 Allergy status to penicillin: Secondary | ICD-10-CM | POA: Insufficient documentation

## 2015-01-13 DIAGNOSIS — Z86018 Personal history of other benign neoplasm: Secondary | ICD-10-CM | POA: Insufficient documentation

## 2015-01-13 DIAGNOSIS — R079 Chest pain, unspecified: Secondary | ICD-10-CM | POA: Insufficient documentation

## 2015-01-13 DIAGNOSIS — J449 Chronic obstructive pulmonary disease, unspecified: Secondary | ICD-10-CM | POA: Diagnosis not present

## 2015-01-13 DIAGNOSIS — R0789 Other chest pain: Secondary | ICD-10-CM | POA: Diagnosis not present

## 2015-01-13 DIAGNOSIS — I10 Essential (primary) hypertension: Secondary | ICD-10-CM | POA: Insufficient documentation

## 2015-01-13 DIAGNOSIS — Z7952 Long term (current) use of systemic steroids: Secondary | ICD-10-CM | POA: Insufficient documentation

## 2015-01-13 DIAGNOSIS — N400 Enlarged prostate without lower urinary tract symptoms: Secondary | ICD-10-CM | POA: Diagnosis not present

## 2015-01-13 LAB — I-STAT TROPONIN, ED: Troponin i, poc: 0 ng/mL (ref 0.00–0.08)

## 2015-01-13 LAB — CBC
HEMATOCRIT: 41.4 % (ref 39.0–52.0)
HEMOGLOBIN: 13.8 g/dL (ref 13.0–17.0)
MCH: 32.8 pg (ref 26.0–34.0)
MCHC: 33.3 g/dL (ref 30.0–36.0)
MCV: 98.3 fL (ref 78.0–100.0)
Platelets: 236 10*3/uL (ref 150–400)
RBC: 4.21 MIL/uL — ABNORMAL LOW (ref 4.22–5.81)
RDW: 14.8 % (ref 11.5–15.5)
WBC: 5.5 10*3/uL (ref 4.0–10.5)

## 2015-01-13 LAB — BASIC METABOLIC PANEL
Anion gap: 6 (ref 5–15)
BUN: 12 mg/dL (ref 6–20)
CHLORIDE: 103 mmol/L (ref 101–111)
CO2: 28 mmol/L (ref 22–32)
Calcium: 9.4 mg/dL (ref 8.9–10.3)
Creatinine, Ser: 1.05 mg/dL (ref 0.61–1.24)
GFR calc Af Amer: >60 mL/min (ref >60)
GLUCOSE: 99 mg/dL (ref 65–99)
POTASSIUM: 4.1 mmol/L (ref 3.5–5.1)
Sodium: 137 mmol/L (ref 135–145)

## 2015-01-13 LAB — PROTIME-INR
INR: 0.92 (ref 0.00–1.49)
Prothrombin Time: 12.5 seconds (ref 11.6–15.2)

## 2015-01-13 LAB — TROPONIN I

## 2015-01-13 NOTE — ED Notes (Signed)
Pt reports lt sided achy chest pain since Saturday.  Pain is constant since then and states the pain gets worse upon movement.  States pain gets better when he presses on it.  Denies NVD.  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

## 2015-01-13 NOTE — ED Notes (Signed)
Pt provided food. Pt keeps complainging he has been here since 0900. rn explained that we had to wait specific amounts of time to draw blood work. Pt irritated.   rn went into room just now and wife was removing IV.

## 2015-01-13 NOTE — ED Provider Notes (Signed)
CSN: [id number]     Arrival date & time 01/13/15  0907 History   First MD Initiated Contact with Patient 01/13/15 0915     Chief Complaint  Patient presents with  . Chest Pain     HPI Patient reports constant left-sided chest pain since Saturday.  He states at times it gets worse with movement.  He denies significant shortness of breath.  Denies nausea and vomiting.  He does have a history of COPD but reports no significant shortness of breath at this time.  No prior history of coronary artery disease.  He states pain became worse and thus he came to the ER for evaluation.  His pain is moderate in severity located in the left lateral chest at this time.  Denies cough.  He is on Plavix and aspirin   Past Medical History  Diagnosis Date  . Peripheral vascular disease, unspecified ([place])   . Angioedema   . Essential hypertension   . Hyperlipidemia   . Personal history of tobacco use, presenting hazards to health   . IBS (irritable bowel syndrome)   . BPH (benign prostatic hyperplasia)   . Anemia   . Impotence   . Personal history of colonic adenoma 09/22/2012  . Helicobacter pylori gastritis 10/10/2012  .  iron deficiency 01/18/2013  . COPD (chronic obstructive pulmonary disease) ([place])     STates he has copd  . GERD (gastroesophageal reflux disease)    Past Surgical History  Procedure Laterality Date  . Abdominal surg for ulcers'  1980  . Distal aortogram    . Percutaneous transluminal angionplasty with placement of 2 self expanding stent in the distal left superfical femeral artery    . Colonoscopy N/A 09/22/2012    Procedure: COLONOSCOPY;  Surgeon: Gatha Mayer, MD;  Location: [facility];  Service: Endoscopy;  Laterality: N/A;   Family History  Problem Relation Age of Onset  . Alcohol abuse Father   . Kidney disease Brother   . Drug abuse Brother   . Diabetes Brother   . Colon cancer Neg Hx   . Diabetes Mother   . Diabetes Brother    Social History  Substance Use Topics   . Smoking status: Former Smoker -- 0.25 packs/day for 50 years    Types: Cigarettes    Start date: 04/12/1957    Quit date: 04/12/2014  . Smokeless tobacco: Never Used     Comment: using chatix; stopped now; took x 1 week / had stopped 3 years prior to starting back in 2005  . Alcohol Use: No     Comment: OCC    Review of Systems  All other systems reviewed and are negative.     Allergies  Ace inhibitors and Penicillins  Home Medications   Prior to Admission medications   Medication Sig Start Date End Date Taking? Authorizing Provider  albuterol (PROVENTIL HFA;VENTOLIN HFA) 108 (90 BASE) MCG/ACT inhaler Inhale 2 puffs into the lungs every 6 (six) hours as needed for wheezing. 11/26/13   Mackie Pai, PA-C  aspirin 81 MG tablet Take 81 mg by mouth daily.      Historical Provider, MD  clopidogrel (PLAVIX) 75 MG tablet Take 1 tablet (75 mg total) by mouth daily. 08/09/14   [name], MD  doxazosin (CARDURA) 4 MG tablet 4 mg as needed.  01/04/15   Historical Provider, MD  esomeprazole (NEXIUM) 40 MG capsule Take 1 capsule (40 mg total) by mouth daily. 03/19/13 01/14/15  Neena Rhymes, MD  gabapentin (NEURONTIN) 300 MG capsule Take 1 capsule (300 mg total) by mouth 3 (three) times daily. 08/02/14   [name], MD  tamsulosin (FLOMAX) 0.4 MG CAPS capsule Take 1 capsule (0.4 mg total) by mouth daily. 04/10/14   [name], MD  traMADol (ULTRAM) 50 MG tablet Take 1 tablet (50 mg total) by mouth every 6 (six) hours as needed. 01/21/14   Golden Circle, FNP  triamcinolone cream (KENALOG) 0.1 % Apply 1 application topically 2 (two) times daily. 12/20/14   [name], MD  varenicline (CHANTIX) 0.5 MG tablet Take 1 tablet (0.5 mg total) by mouth 2 (two) times daily. 05/14/14   [name], MD   BP 174/95 mmHg  Pulse 50  Temp(Src) 97.9 F (36.6 C) (Oral)  Resp 16  SpO2 100% Physical Exam  Constitutional: He is oriented to person, place, and  time. He appears well-developed and well-nourished.  HENT:  Head: Normocephalic and atraumatic.  Eyes: EOM are normal.  Neck: Normal range of motion.  Cardiovascular: Normal rate, regular rhythm, normal heart sounds and intact distal pulses.   Pulmonary/Chest: Effort normal and breath sounds normal. No respiratory distress.  Abdominal: Soft. He exhibits no distension. There is no tenderness.  Musculoskeletal: Normal range of motion.  Neurological: He is alert and oriented to person, place, and time.  Skin: Skin is warm and dry.  Psychiatric: He has a normal mood and affect. Judgment normal.  Nursing note and vitals reviewed.   ED Course  Procedures (including critical care time) Labs Review Labs Reviewed  CBC - Abnormal; Notable for the following:    RBC 4.21 (*)    All other components within normal limits  BASIC METABOLIC PANEL  PROTIME-INR  TROPONIN I  I-STAT TROPOININ, ED    .  ECG interpretation #1   Date: 01/13/2015   915am  Rate: 54  Rhythm: normal sinus rhythm  QRS Axis: normal  Intervals: normal  ST/T Wave abnormalities: nonspecific ST changes  Conduction Disutrbances: none  Narrative Interpretation: artifact and poor baseline effects interpretation  Old EKG Reviewed: No significant changes noted       EKG Interpretation #2 Date/Time:  Monday January 13 2015 09:41:44  Ventricular Rate:  52 PR Interval:  192 QRS Duration: 74 QT Interval:  432 QTC Calculation: 402 R Axis:   75 Text Interpretation:  Sinus rhythm. Early repol pattern, No significant change was found Confirmed by Umberto Pavek  MD, Manali Mcelmurry ([id number]) on  01/13/2015 9:44:18 AM      MDM   Final diagnoses:  None   EKG 2 is without significant ischemic changes.  Troponin 2 is negative.  Discharge home with primary care follow-up.  Patient understands to return the ER for new or worsening symptoms.  Low suspicion for PE and ACS    Jola Schmidt, MD 01/16/15 1710

## 2015-01-13 NOTE — ED Notes (Signed)
Pt alert and oriented x4. Respirations even and unlabored, bilateral symmetrical rise and fall of chest. Skin warm and dry. In no acute distress. Denies needs.   

## 2015-01-13 NOTE — ED Notes (Signed)
md at bedside

## 2015-01-13 NOTE — ED Notes (Signed)
2x attempt to draw lab, unsuccessful - RN aware

## 2015-01-13 NOTE — ED Notes (Signed)
Patient transported to X-ray 

## 2015-01-28 ENCOUNTER — Ambulatory Visit (INDEPENDENT_AMBULATORY_CARE_PROVIDER_SITE_OTHER): Payer: Commercial Managed Care - HMO | Admitting: Internal Medicine

## 2015-01-28 ENCOUNTER — Encounter: Payer: Self-pay | Admitting: Internal Medicine

## 2015-01-28 VITALS — BP 138/82 | HR 79 | Temp 97.6°F | Resp 16 | Wt 173.0 lb

## 2015-01-28 DIAGNOSIS — S161XXA Strain of muscle, fascia and tendon at neck level, initial encounter: Secondary | ICD-10-CM

## 2015-01-28 NOTE — Progress Notes (Signed)
Pre visit review using our clinic review tool, if applicable. No additional management support is needed unless otherwise documented below in the visit note. 

## 2015-01-28 NOTE — Patient Instructions (Signed)
You have muscle pain in your neck  Try taking tylenol if needed for the pain  Try using over-the-counter topical medications such as bengay or icy hot - apply to sore muscle area.                          Cervical Sprain A cervical sprain is an injury in the neck in which the strong, fibrous tissues (ligaments) that connect your neck bones stretch or tear. Cervical sprains can range from mild to severe. Severe cervical sprains can cause the neck vertebrae to be unstable. This can lead to damage of the spinal cord and can result in serious nervous system problems. The amount of time it takes for a cervical sprain to get better depends on the cause and extent of the injury. Most cervical sprains heal in 1 to 3 weeks. CAUSES  Severe cervical sprains may be caused by:   Contact sport injuries (such as from football, rugby, wrestling, hockey, auto racing, gymnastics, diving, martial arts, or boxing).   Motor vehicle collisions.   Whiplash injuries. This is an injury from a sudden forward and backward whipping movement of the head and neck.  Falls.  Mild cervical sprains may be caused by:   Being in an awkward position, such as while cradling a telephone between your ear and shoulder.   Sitting in a chair that does not offer proper support.   Working at a poorly [employer] station.   Looking up or down for long periods of time.  SYMPTOMS   Pain, soreness, stiffness, or a burning sensation in the front, back, or sides of the neck. This discomfort may develop immediately after the injury or slowly, 24 hours or more after the injury.   Pain or tenderness directly in the middle of the back of the neck.   Shoulder or upper back pain.   Limited ability to move the neck.   Headache.   Dizziness.   Weakness, numbness, or tingling in the hands or arms.   Muscle spasms.   Difficulty swallowing or chewing.   Tenderness and swelling of the neck.  DIAGNOSIS    Most of the time your health care provider can diagnose a cervical sprain by taking your history and doing a physical exam. Your health care provider will ask about previous neck injuries and any known neck problems, such as arthritis in the neck. X-rays may be taken to find out if there are any other problems, such as with the bones of the neck. Other tests, such as a CT scan or MRI, may also be needed.  TREATMENT  Treatment depends on the severity of the cervical sprain. Mild sprains can be treated with rest, keeping the neck in place (immobilization), and pain medicines. Severe cervical sprains are immediately immobilized. Further treatment is done to help with pain, muscle spasms, and other symptoms and may include:  Medicines, such as pain relievers, numbing medicines, or muscle relaxants.   Physical therapy. This may involve stretching exercises, strengthening exercises, and posture training. Exercises and improved posture can help stabilize the neck, strengthen muscles, and help stop symptoms from returning.  HOME CARE INSTRUCTIONS   Put ice on the injured area.   Put ice in a plastic bag.   Place a towel between your skin and the bag.   Leave the ice on for 15-20 minutes, 3-4 times a day.   If your injury was severe, you may have been given a  cervical collar to wear. A cervical collar is a two-piece collar designed to keep your neck from moving while it heals.  Do not remove the collar unless instructed by your health care provider.  If you have long hair, keep it outside of the collar.  Ask your health care provider before making any adjustments to your collar. Minor adjustments may be required over time to improve comfort and reduce pressure on your chin or on the back of your head.  Ifyou are allowed to remove the collar for cleaning or bathing, follow your health care provider's instructions on how to do so safely.  Keep your collar clean by wiping it with mild soap  and water and drying it completely. If the collar you have been given includes removable pads, remove them every 1-2 days and hand wash them with soap and water. Allow them to air dry. They should be completely dry before you wear them in the collar.  If you are allowed to remove the collar for cleaning and bathing, wash and dry the skin of your neck. Check your skin for irritation or sores. If you see any, tell your health care provider.  Do not drive while wearing the collar.   Only take over-the-counter or prescription medicines for pain, discomfort, or fever as directed by your health care provider.   Keep all follow-up appointments as directed by your health care provider.   Keep all physical therapy appointments as directed by your health care provider.   Make any needed adjustments to your workstation to promote good posture.   Avoid positions and activities that make your symptoms worse.   Warm up and stretch before being active to help prevent problems.  SEEK MEDICAL CARE IF:   Your pain is not controlled with medicine.   You are unable to decrease your pain medicine over time as planned.   Your activity level is not improving as expected.  SEEK IMMEDIATE MEDICAL CARE IF:   You develop any bleeding.  You develop stomach upset.  You have signs of an allergic reaction to your medicine.   Your symptoms get worse.   You develop new, unexplained symptoms.   You have numbness, tingling, weakness, or paralysis in any part of your body.  MAKE SURE YOU:   Understand these instructions.  Will watch your condition.  Will get help right away if you are not doing well or get worse.   This information is not intended to replace advice given to you by your health care provider. Make sure you discuss any questions you have with your health care provider.   Document Released: 01/24/2007 Document Revised: 04/03/2013 Document Reviewed: 10/04/2012 Elsevier  Interactive Patient Education [employer].

## 2015-01-28 NOTE — Progress Notes (Signed)
Subjective:    Patient ID: Henry Carter, male    DOB: Feb 11, 1941, 74 y.o.   MRN: [id number]  HPI He is here for neck pain. The pain is located in the right posterior neck. It started last week and he is unsure why. He has increased pain when looking toward the right.  Massaging the area helps a little.  Has not tried taking any meds  He denies headaches dizziness, lightheadedness, fevers/chills. He denies any pain or numbness in his arms.  When he came in he also expressed some ear pain and decreased hearing. Both ears had excessive cerumen and ear lavage was performed. After the lavage his ear is clear of wax and his hearing had improved.    Medications and allergies reviewed with patient and updated if appropriate.  Patient Active Problem List   Diagnosis Date Noted  . Tobacco abuse 05/16/2014  . Boil, buttock 05/16/2014  . Hydrocephalus 01/17/2014  . Memory loss 01/17/2014  . Tinnitus of right ear 07/03/2013  . Abnormality of gait 07/03/2013  . 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 Healthcare maintenance 02/07/2013  . Iron deficiency anemia due to chronic blood loss 01/18/2013  . C O P D 04/08/2010  . ATHEROSLERO NATV ART EXTREM W/INTERMIT CLAUDICAT 11/26/2008  . BPH (benign prostatic hypertrophy) 10/20/2007  . PERIPHERAL VASCULAR DISEASE 05/19/2007  . ESSENTIAL HYPERTENSION 02/01/2007  . HYPERLIPIDEMIA 01/03/2007    Past Medical History  Diagnosis Date  . Peripheral vascular disease, unspecified ([place])   . Angioedema   . Essential hypertension   . Hyperlipidemia   . Personal history of tobacco use, presenting hazards to health   . IBS (irritable bowel syndrome)   . BPH (benign prostatic hyperplasia)   . Anemia   . Impotence   . Personal history of colonic adenoma 09/22/2012  . Helicobacter pylori gastritis 10/10/2012  .  iron deficiency 01/18/2013  . COPD (chronic obstructive pulmonary disease) ([place])     STates he has copd  . GERD  (gastroesophageal reflux disease)     Past Surgical History  Procedure Laterality Date  . Abdominal surg for ulcers'  1980  . Distal aortogram    . Percutaneous transluminal angionplasty with placement of 2 self expanding stent in the distal left superfical femeral artery    . Colonoscopy N/A 09/22/2012    Procedure: COLONOSCOPY;  Surgeon: Gatha Mayer, MD;  Location: [facility];  Service: Endoscopy;  Laterality: N/A;    Social History   Social History  . Marital Status: Married    Spouse Name: N/A  . Number of Children: N/A  . Years of Education: N/A   Social History Main Topics  . Smoking status: Former Smoker -- 0.25 packs/day for 50 years    Types: Cigarettes    Start date: 04/12/1957    Quit date: 04/12/2014  . Smokeless tobacco: Never Used     Comment: using chatix; stopped now; took x 1 week / had stopped 3 years prior to starting back in 2005  . Alcohol Use: No     Comment: OCC  . Drug Use: Yes     Comment: States marijuanna relaxes him;   . Sexual Activity: Not Asked     Comment: marijuanna   Other Topics Concern  . 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  HENT: Positive for ear pain and hearing loss.   Neurological: Negative for dizziness, weakness, light-headedness, numbness and headaches.       Objective:   Filed Vitals:   01/28/15 0926  BP: 138/82  Pulse: 79  Temp: 97.6 F (36.4 C)  Resp: 16   Filed Weights   01/28/15 0926  Weight: 173 lb (78.472 kg)   Body mass index is 25.54 kg/(m^2).   Physical Exam  Constitutional: He is oriented to person, place, and time. He appears well-developed and well-nourished. No distress.  HENT:  Head: Normocephalic and atraumatic.  Right Ear: External ear normal.  Left Ear: External ear normal.  Mouth/Throat:  Oropharynx is clear and moist.  Eyes: Conjunctivae are normal.  Neck: Neck supple. No tracheal deviation present. No thyromegaly present.  Musculoskeletal: He exhibits no edema.  Pain with palpation muscular region of right posterior neck  Lymphadenopathy:    He has no cervical adenopathy.  Neurological: He is alert and oriented to person, place, and time. No cranial nerve deficit.        Assessment & Plan:   Neck muscle strain Occurred about 1 week ago for unknown reasong Muscular in nature apply heat, gentle stretching Can try Tylenol for pain if needed He will try a topical medication such as BenGay We both agree we should avoid prescription medication including muscle relaxers unless there is no improvement  Call or return if no improvement in symptoms

## 2015-04-11 ENCOUNTER — Encounter: Payer: Self-pay | Admitting: Internal Medicine

## 2015-04-11 ENCOUNTER — Ambulatory Visit (INDEPENDENT_AMBULATORY_CARE_PROVIDER_SITE_OTHER): Payer: Commercial Managed Care - HMO | Admitting: Internal Medicine

## 2015-04-11 VITALS — BP 124/72 | HR 60 | Temp 97.9°F | Resp 20 | Ht 69.0 in | Wt 171.2 lb

## 2015-04-11 DIAGNOSIS — I10 Essential (primary) hypertension: Secondary | ICD-10-CM | POA: Diagnosis not present

## 2015-04-11 DIAGNOSIS — H9311 Tinnitus, right ear: Secondary | ICD-10-CM | POA: Diagnosis not present

## 2015-04-11 NOTE — Assessment & Plan Note (Signed)
Stable and no change. Given exercises to help with vestibular balance.

## 2015-04-11 NOTE — Assessment & Plan Note (Signed)
BP at goal and no symptoms of lightheadedness with standing. Controlled on cardura.

## 2015-04-11 NOTE — Progress Notes (Signed)
   Subjective:    Patient ID: Henry Carter, male    DOB: 06/24/1940, 74 y.o.   MRN: [id number]  HPI The patient is a 74 YO man coming in for dizziness. This has been going on for several months. More in the morning. Denies feeling like he might pass out. No syncope. Does not feel like the room is spinning. Has not fallen. Passes during the morning and then does okay. No sinus pain or tenderness.   Review of Systems  Constitutional: Negative for fever, activity change, appetite change, fatigue and unexpected weight change.  Respiratory: Negative for cough, chest tightness, shortness of breath and wheezing.   Cardiovascular: Negative for chest pain, palpitations and leg swelling.  Gastrointestinal: Negative.   Musculoskeletal: Negative for myalgias.  Neurological: Negative for dizziness, light-headedness and headaches.      Objective:   Physical Exam  Constitutional: He is oriented to person, place, and time. He appears well-developed and well-nourished.  HENT:  Head: Normocephalic and atraumatic.  Eyes: EOM are normal.  Neck: Normal range of motion.  Cardiovascular: Normal rate and regular rhythm.   Pulmonary/Chest: Effort normal and breath sounds normal.  Abdominal: Soft. Bowel sounds are normal.  Musculoskeletal: He exhibits no edema.  Neurological: He is alert and oriented to person, place, and time. Coordination normal.  Skin: Skin is warm and dry.  Psychiatric: He has a normal mood and affect.   Filed Vitals:   04/11/15 0942  BP: 124/72  Pulse: 60  Temp: 97.9 F (36.6 C)  TempSrc: Oral  Resp: 20  Height: 5\' 9"  (1.753 m)  Weight: 171 lb 3 oz (77.65 kg)  SpO2: 99%      Assessment & Plan:

## 2015-04-11 NOTE — Progress Notes (Signed)
Pre visit review using our clinic review tool, if applicable. No additional management support is needed unless otherwise documented below in the visit note. 

## 2015-04-11 NOTE — Patient Instructions (Signed)
We have given you a sheet to help with balance.   We do not need blood work today and will not change the medicines.

## 2015-07-18 ENCOUNTER — Telehealth: Payer: Self-pay

## 2015-07-18 NOTE — Telephone Encounter (Signed)
I left patient message to call back to schedule

## 2015-07-18 NOTE — Telephone Encounter (Signed)
Call to Henry Carter; AWV completed 4/14; call to schedule AWV for this year; would like to do the same day as seeing [name] Schedule to call and schedule AWV p 6/30 per Dr. Nathanial Millman last note

## 2015-08-22 ENCOUNTER — Other Ambulatory Visit: Payer: Self-pay | Admitting: Internal Medicine

## 2015-10-16 ENCOUNTER — Ambulatory Visit (INDEPENDENT_AMBULATORY_CARE_PROVIDER_SITE_OTHER): Payer: Commercial Managed Care - HMO | Admitting: Internal Medicine

## 2015-10-16 ENCOUNTER — Encounter: Payer: Self-pay | Admitting: Internal Medicine

## 2015-10-16 ENCOUNTER — Other Ambulatory Visit (INDEPENDENT_AMBULATORY_CARE_PROVIDER_SITE_OTHER): Payer: Commercial Managed Care - HMO

## 2015-10-16 VITALS — BP 138/82 | HR 73 | Temp 97.7°F | Resp 12 | Ht 68.5 in | Wt 164.0 lb

## 2015-10-16 DIAGNOSIS — Z0000 Encounter for general adult medical examination without abnormal findings: Secondary | ICD-10-CM

## 2015-10-16 DIAGNOSIS — G919 Hydrocephalus, unspecified: Secondary | ICD-10-CM

## 2015-10-16 DIAGNOSIS — M25512 Pain in left shoulder: Secondary | ICD-10-CM | POA: Diagnosis not present

## 2015-10-16 DIAGNOSIS — L0233 Carbuncle of buttock: Secondary | ICD-10-CM

## 2015-10-16 DIAGNOSIS — R42 Dizziness and giddiness: Secondary | ICD-10-CM

## 2015-10-16 DIAGNOSIS — E785 Hyperlipidemia, unspecified: Secondary | ICD-10-CM

## 2015-10-16 DIAGNOSIS — L0232 Furuncle of buttock: Secondary | ICD-10-CM | POA: Insufficient documentation

## 2015-10-16 LAB — VITAMIN B12: VITAMIN B 12: 362 pg/mL (ref 211–911)

## 2015-10-16 LAB — COMPREHENSIVE METABOLIC PANEL
ALBUMIN: 4 g/dL (ref 3.5–5.2)
ALK PHOS: 112 U/L (ref 39–117)
ALT: 12 U/L (ref 0–53)
AST: 20 U/L (ref 0–37)
BILIRUBIN TOTAL: 0.6 mg/dL (ref 0.2–1.2)
BUN: 13 mg/dL (ref 6–23)
CO2: 28 mEq/L (ref 19–32)
Calcium: 9.8 mg/dL (ref 8.4–10.5)
Chloride: 104 mEq/L (ref 96–112)
Creatinine, Ser: 1.13 mg/dL (ref 0.40–1.50)
GFR: 81.51 mL/min (ref >60.00)
Glucose, Bld: 102 mg/dL — ABNORMAL HIGH (ref 70–99)
POTASSIUM: 4.3 meq/L (ref 3.5–5.1)
SODIUM: 137 meq/L (ref 135–145)
TOTAL PROTEIN: 6.7 g/dL (ref 6.0–8.3)

## 2015-10-16 LAB — LIPID PANEL
CHOLESTEROL: 216 mg/dL — AB (ref 0–200)
HDL: 59.5 mg/dL (ref >39.00)
LDL Cholesterol: 134 mg/dL — ABNORMAL HIGH (ref 0–99)
NonHDL: 156.41
Total CHOL/HDL Ratio: 4
Triglycerides: 112 mg/dL (ref 0.0–149.0)
VLDL: 22.4 mg/dL (ref 0.0–40.0)

## 2015-10-16 LAB — CBC
HCT: 41.8 % (ref 39.0–52.0)
Hemoglobin: 13.9 g/dL (ref 13.0–17.0)
MCHC: 33.3 g/dL (ref 30.0–36.0)
MCV: 96.4 fl (ref 78.0–100.0)
PLATELETS: 189 10*3/uL (ref 150.0–400.0)
RBC: 4.33 Mil/uL (ref 4.22–5.81)
RDW: 15.6 % — ABNORMAL HIGH (ref 11.5–15.5)
WBC: 4.2 10*3/uL (ref 4.0–10.5)

## 2015-10-16 LAB — TSH: TSH: 1.34 u[IU]/mL (ref 0.35–4.50)

## 2015-10-16 MED ORDER — METHYLPREDNISOLONE ACETATE 40 MG/ML IJ SUSP
40.0000 mg | Freq: Once | INTRAMUSCULAR | Status: AC
  Administered 2015-10-16: 40 mg via INTRAMUSCULAR

## 2015-10-16 MED ORDER — TRIAMCINOLONE ACETONIDE 0.1 % EX CREA: 1.0000 "application " | TOPICAL_CREAM | Freq: Two times a day (BID) | CUTANEOUS | Status: DC

## 2015-10-16 MED ORDER — SULFAMETHOXAZOLE-TRIMETHOPRIM 800-160 MG PO TABS: 1.0000 | ORAL_TABLET | Freq: Two times a day (BID) | ORAL | Status: DC

## 2015-10-16 NOTE — Assessment & Plan Note (Signed)
Since he is having ongoing dizziness needs MRI brain to check for progression. 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 He is having urinary symptoms and memory changes as well as the dizziness.

## 2015-10-16 NOTE — Assessment & Plan Note (Signed)
Depo-medrol 40 mg IM given at visit. Given stretching exercises and if no improvement wants to see sports medicine. Talked to him about PT and he would rather do sports medicine first if needed.

## 2015-10-16 NOTE — Assessment & Plan Note (Signed)
Ordered MRI and labs to exclude reversible causes. 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

## 2015-10-16 NOTE — Patient Instructions (Addendum)
We have given you the steroid shot today.   We have sent in the antibiotic for the boils called bactrim. Take 1 pill twice a day for 1 week.   We are checking blood work today and will call you back with the results.   We are also going to check an image of the brain to check for a cause of the dizziness.   Shoulder Range of Motion Exercises Shoulder range of motion (ROM) exercises are designed to keep the shoulder moving freely. They are often recommended for people who have shoulder pain. MOVEMENT EXERCISE When you are able, do this exercise 5-6 days per week, or as told by your health care provider. Work toward doing 2 sets of 10 swings. Pendulum Exercise How To Do This Exercise Lying Down  Lie face-down on a bed with your abdomen close to the side of the bed.  Let your arm hang over the side of the bed.  Relax your shoulder, arm, and hand.  Slowly and gently swing your arm forward and back. Do not use your neck muscles to swing your arm. They should be relaxed. If you are struggling to swing your arm, have someone gently swing it for you. When you do this exercise for the first time, swing your arm at a 15 degree angle for 15 seconds, or swing your arm 10 times. As pain lessens over time, increase the angle of the swing to 30-45 degrees.  Repeat steps 1-4 with the other arm. How To Do This Exercise While Standing 1. Stand next to a sturdy chair or table and hold on to it with your hand.  Bend forward at the waist.  Bend your knees slightly.  Relax your other arm and let it hang limp.  Relax the shoulder blade of the arm that is hanging and let it drop.  While keeping your shoulder relaxed, use body motion to swing your arm in small circles. The first time you do this exercise, swing your arm for about 30 seconds or 10 times. When you do it next time, swing your arm for a little longer.  Stand up tall and relax.  Repeat steps 1-7, this time changing the direction of the  circles. 2. Repeat steps 1-8 with the other arm. STRETCHING EXERCISES Do these exercises 3-4 times per day on 5-6 days per week or as told by your health care provider. Work toward holding the stretch for 20 seconds. Stretching Exercise 1 1. Lift your arm straight out in front of you. 2. Bend your arm 90 degrees at the elbow (right angle) so your forearm goes across your body and looks like the letter "L." 3. Use your other arm to gently pull the elbow forward and across your body. 4. Repeat steps 1-3 with the other arm. Stretching Exercise 2 You will need a towel or rope for this exercise. 1. Bend one arm behind your back with the palm facing outward. 2. Hold a towel with your other hand. 3. Reach the arm that holds the towel above your head, and bend that arm at the elbow. Your wrist should be behind your neck. 4. Use your free hand to grab the free end of the towel. 5. With the higher hand, gently pull the towel up behind you. 6. With the lower hand, pull the towel down behind you. 7. Repeat steps 1-6 with the other arm. STRENGTHENING EXERCISES Do each of these exercises at four different times of day (sessions) every day or as  told by your health care provider. To begin with, repeat each exercise 5 times (repetitions). Work toward doing 3 sets of 12 repetitions or as told by your health care provider. Strengthening Exercise 1 You will need a light weight for this activity. As you grow stronger, you may use a heavier weight. 1. Standing with a weight in your hand, lift your arm straight out to the side until it is at the same height as your shoulder. 2. Bend your arm at 90 degrees so that your fingers are pointing to the ceiling. 3. Slowly raise your hand until your arm is straight up in the air. 4. Repeat steps 1-3 with the other arm. Strengthening Exercise 2 You will need a light weight for this activity. As you grow stronger, you may use a heavier weight. 1. Standing with a weight  in your hand, gradually move your straight arm in an arc, starting at your side, then out in front of you, then straight up over your head. 2. Gradually move your other arm in an arc, starting at your side, then out in front of you, then straight up over your head. 3. Repeat steps 1-2 with the other arm. Strengthening Exercise 3 You will need an elastic band for this activity. As you grow stronger, gradually increase the size of the bands or increase the number of bands that you use at one time. 1. While standing, hold an elastic band in one hand and raise that arm up in the air. 2. With your other hand, pull down the band until that hand is by your side. 3. Repeat steps 1-2 with the other arm.   This information is not intended to replace advice given to you by your health care provider. Make sure you discuss any questions you have with your health care provider.   Document Released: 12/26/2002 Document Revised: 08/13/2014 Document Reviewed: 03/25/2014 Elsevier Interactive Patient Education 2016 [employer].    Henry Carter , Thank you for taking time to come for your Medicare Wellness Visit. I appreciate your ongoing commitment to your health goals. Please review the following plan we discussed and let me know if I can assist you in the future.   Deaf & Hard of Hearing Division Services  No reviews  [employer] Office  [address]  [phone] will pay for one hearing aid   Educated to check with insurance regarding coverage of Shingles vaccination on Part D or Part B and may have lower co-pay if provided on the Part D side  Will try to get eye exam.  Wait on hearing screen for now   These are the goals we discussed: Goals    . Exercise 3x per week (30 min per time)     Wants to get back on the golf;  As much as possible; If right hip and leg is better; will play 3 times per week    . patient     Get back on golf course; COPD is challenging; will go to the  driving range and hit balls       This is a list of the screening recommended for you and due dates:  Health Maintenance  Topic Date Due  . Shingles Vaccine  06/25/2001  . Flu Shot  11/11/2015  . Tetanus Vaccine  07/08/2017  . Colon Cancer Screening  09/23/2022  . Pneumonia vaccines  Completed     Health Maintenance, Male A healthy lifestyle and preventative care can  promote health and wellness.  Maintain regular health, dental, and eye exams.  Eat a healthy diet. Foods like vegetables, fruits, whole grains, low-fat dairy products, and lean protein foods contain the nutrients you need and are low in calories. Decrease your intake of foods high in solid fats, added sugars, and salt. Get information about a proper diet from your health care provider, if necessary.  Regular physical exercise is one of the most important things you can do for your health. Most adults should get at least 150 minutes of moderate-intensity exercise (any activity that increases your heart rate and causes you to sweat) each week. In addition, most adults need muscle-strengthening exercises on 2 or more days a week.   Maintain a healthy weight. The body mass index (BMI) is a screening tool to identify possible weight problems. It provides an estimate of body fat based on height and weight. Your health care provider can find your BMI and can help you achieve or maintain a healthy weight. For males 20 years and older:  A BMI below 18.5 is considered underweight.  A BMI of 18.5 to 24.9 is normal.  A BMI of 25 to 29.9 is considered overweight.  A BMI of 30 and above is considered obese.  Maintain normal blood lipids and cholesterol by exercising and minimizing your intake of saturated fat. Eat a balanced diet with plenty of fruits and vegetables. Blood tests for lipids and cholesterol should begin at age 67 and be repeated every 5 years. If your lipid or cholesterol levels are high, you are over age 11, or you  are at high risk for heart disease, you may need your cholesterol levels checked more frequently.Ongoing high lipid and cholesterol levels should be treated with medicines if diet and exercise are not working.  If you smoke, find out from your health care provider how to quit. If you do not use tobacco, do not start.  Lung cancer screening is recommended for adults aged 32-80 years who are at high risk for developing lung cancer because of a history of smoking. A yearly low-dose CT scan of the lungs is recommended for people who have at least a 30-pack-year history of smoking and are current smokers or have quit within the past 15 years. A pack year of smoking is smoking an average of 1 pack of cigarettes a day for 1 year (for example, a 30-pack-year history of smoking could mean smoking 1 pack a day for 30 years or 2 packs a day for 15 years). Yearly screening should continue until the smoker has stopped smoking for at least 15 years. Yearly screening should be stopped for people who develop a health problem that would prevent them from having lung cancer treatment.  If you choose to drink alcohol, do not have more than 2 drinks per day. One drink is considered to be 12 oz (360 mL) of beer, 5 oz (150 mL) of wine, or 1.5 oz (45 mL) of liquor.  Avoid the use of street drugs. Do not share needles with anyone. Ask for help if you need support or instructions about stopping the use of drugs.  High blood pressure causes heart disease and increases the risk of stroke. High blood pressure is more likely to develop in:  People who have blood pressure in the end of the normal range (100-139/85-89 mm Hg).  People who are overweight or obese.  People who are African American.  If you are 22-19 years of age, have your blood  pressure checked every 3-5 years. If you are 23 years of age or older, have your blood pressure checked every year. You should have your blood pressure measured twice--once when you are at  a hospital or clinic, and once when you are not at a hospital or clinic. Record the average of the two measurements. To check your blood pressure when you are not at a hospital or clinic, you can use:  An automated blood pressure machine at a pharmacy.  A home blood pressure monitor.  If you are 24-72 years old, ask your health care provider if you should take aspirin to prevent heart disease.  Diabetes screening involves taking a blood sample to check your fasting blood sugar level. This should be done once every 3 years after age 21 if you are at a normal weight and without risk factors for diabetes. Testing should be considered at a younger age or be carried out more frequently if you are overweight and have at least 1 risk factor for diabetes.  Colorectal cancer can be detected and often prevented. Most routine colorectal cancer screening begins at the age of 76 and continues through age 35. However, your health care provider may recommend screening at an earlier age if you have risk factors for colon cancer. On a yearly basis, your health care provider may provide home test kits to check for hidden blood in the stool. A small camera at the end of a tube may be used to directly examine the colon (sigmoidoscopy or colonoscopy) to detect the earliest forms of colorectal cancer. Talk to your health care provider about this at age 26 when routine screening begins. A direct exam of the colon should be repeated every 5-10 years through age 41, unless early forms of precancerous polyps or small growths are found.  People who are at an increased risk for hepatitis B should be screened for this virus. You are considered at high risk for hepatitis B if:  You were born in a country where hepatitis B occurs often. Talk with your health care provider about which countries are considered high risk.  Your parents were born in a high-risk country and you have not received a shot to protect against hepatitis B  (hepatitis B vaccine).  You have HIV or AIDS.  You use needles to inject street drugs.  You live with, or have sex with, someone who has hepatitis B.  You are a man who has sex with other men (MSM).  You get hemodialysis treatment.  You take certain medicines for conditions like cancer, organ transplantation, and autoimmune conditions.  Hepatitis C blood testing is recommended for all people born from [birth year] through 1964 and any individual with known risk factors for hepatitis C.  Healthy men should no longer receive prostate-specific antigen (PSA) blood tests as part of routine cancer screening. Talk to your health care provider about prostate cancer screening.  Testicular cancer screening is not recommended for adolescents or adult males who have no symptoms. Screening includes self-exam, a health care provider exam, and other screening tests. Consult with your health care provider about any symptoms you have or any concerns you have about testicular cancer.  Practice safe sex. Use condoms and avoid high-risk sexual practices to reduce the spread of sexually transmitted infections (STIs).  You should be screened for STIs, including gonorrhea and chlamydia if:  You are sexually active and are younger than 24 years.  You are older than 24 years, and your health care provider  tells you that you are at risk for this type of infection.  Your sexual activity has changed since you were last screened, and you are at an increased risk for chlamydia or gonorrhea. Ask your health care provider if you are at risk.  If you are at risk of being infected with HIV, it is recommended that you take a prescription medicine daily to prevent HIV infection. This is called pre-exposure prophylaxis (PrEP). You are considered at risk if:  You are a man who has sex with other men (MSM).  You are a heterosexual man who is sexually active with multiple partners.  You take drugs by injection.  You are  sexually active with a partner who has HIV.  Talk with your health care provider about whether you are at high risk of being infected with HIV. If you choose to begin PrEP, you should first be tested for HIV. You should then be tested every 3 months for as long as you are taking PrEP.  Use sunscreen. Apply sunscreen liberally and repeatedly throughout the day. You should seek shade when your shadow is shorter than you. Protect yourself by wearing long sleeves, pants, a wide-brimmed hat, and sunglasses year round whenever you are outdoors.  Tell your health care provider of new moles or changes in moles, especially if there is a change in shape or color. Also, tell your health care provider if a mole is larger than the size of a pencil eraser.  A one-time screening for abdominal aortic aneurysm (AAA) and surgical repair of large AAAs by ultrasound is recommended for men aged 84-75 years who are current or former smokers.  Stay current with your vaccines (immunizations).   This information is not intended to replace advice given to you by your health care provider. Make sure you discuss any questions you have with your health care provider.   Document Released: 09/25/2007 Document Revised: 04/19/2014 Document Reviewed: 08/24/2010 Elsevier Interactive Patient Education [employer].

## 2015-10-16 NOTE — Progress Notes (Signed)
Subjective:   Henry Carter is a 75 y.o. male who presents for Medicare Annual/Subsequent preventive examination.  Review of Systems deferred to MD  Cardiac Risk Factors include: advanced age (>71men, >23 women);male gender;family history of premature cardiovascular disease;sedentary lifestyle HRA assessment completed during this visit with Henry Carter   The Patient was informed the wellness visit is to identify future health risk and educate and initiate measures that can reduce risk for increased disease through the lifespan.    Last OV:  03/2015 Labs completed: 10/10/2014 (lipids 183; Trig 69; LDL 109)   Lifestyle review and risk: lives with wife Children are "Grown and gone" takes care of his dog  Fairly sedentary; states his wife is in rehab;   Tobacco: no/ states he no longer is smoking;   How many drinks do you have per week? A beer occasionally  Medications; states he has assistance with meds and no issues; compliant   BMI: 24  Diet; bacon eggs Lunch out if running errands or currently taking wife to rehab.  Henry Carter cooks dinner; Likes to [employer], pork chops; vegetables from freezer last year;   Teeth or Denture issues? Dentures bottom and top; no issues with mastication  Exercise;  Fairly sedentary but does have a burst of exercise in the am; does some strength training;   Stretching exercises, jumping jacks 35- 45; Actually can do a full jumping jack without any issue;  Goal is to play golf again and wants set a goal of going to the driving range to hit balls.  Is challenged by his imbalance which is being followed by Henry Carter.   HOME SAFETY; one level  Fall hx; no   Fall risk: Fear of falling? Yes; no hx fractures;  Gait: is great today but states his imbalance has been an issue.  Given education on "Fall Prevention in the Home" for more safety tips the patient can apply as appropriate.   Safety features reviewed for safe community; firearms if  in the home; smoke alarms (yes); sun protection when outside; driving difficulties or accidents-none to date  Mental Health:  Any emotional problems? Anxious, depressed, irritable, sad or blue? no Equilibrium causes him some anxiety;  Denies feeling depressed or hopeless; voices pleasure in daily life: no    Cognitive; forgets quickly/; states he has to look at i-phone for dates; keeps apt on iphone;  Correct 2 serial 7's from 100.  Recall of 3 objects was 0; Affect appropriate; was able to draw a picture;  Modified test given today in lieu of time constraints as well as the patient admitted he had "difficulty" writing and therefore could be falsely low; but has awareness of his issues and compensates appropriately using his choice of tools. No failures at task to date; On time to apt, Will monitor over time;    Any dizziness when standing up? No; but generalized dizziness at times being evaluated   Sleep pattern changes; no  Urinary or fecal incontinence reviewed: no  Advanced Directive addressed; Completed or educated   Counseling Health Maintenance Gaps:  Colonoscopy; 09/2022/  EKG: 01/2015  Hearing: no issues; does have ears cleaned often   Ophthalmology exam; not recently; Agrees to fup this year for eye exam    Immunizations Due: shingles due and educated; Does understand this is generally covered under Part D but can check his insurance company for coverage at the office as well as at the pharmacy.   Individual Goal: to start playing golf  and will start by practicing at the driving range  Health Recommendations and Referrals Smoking Cessation ongoing  Barriers to Success Dizziness makes him less active and fearful of falling;  States he stays home when dizizy.     Current Care Team reviewed and updated  Education provided and lifestyle risk discussed   All Health Maintenance Gaps Reviewed for closure        Objective:    Vitals: BP 138/82 mmHg  Pulse 73   Temp(Src) 97.7 F (36.5 C) (Oral)  Resp 12  Ht 5' 8.5" (1.74 m)  Wt 164 lb (74.39 kg)  BMI 24.57 kg/m2  SpO2 96%  Body mass index is 24.57 kg/(m^2).  Tobacco History  Smoking status  . Former Smoker -- 0.25 packs/day for 50 years  . Types: Cigarettes  . Start date: 04/12/1957  . Quit date: 04/12/2014  Smokeless tobacco  . Never Used    Comment: using chatix; stopped now; took x 1 week / had stopped 3 years prior to starting back in 2005     Counseling given: Not Answered   Past Medical History  Diagnosis Date  . Peripheral vascular disease, unspecified ([place])   . Angioedema   . Essential hypertension   . Hyperlipidemia   . Personal history of tobacco use, presenting hazards to health   . IBS (irritable bowel syndrome)   . BPH (benign prostatic hyperplasia)   . Anemia   . Impotence   . Personal history of colonic adenoma 09/22/2012  . Helicobacter pylori gastritis 10/10/2012  .  iron deficiency 01/18/2013  . COPD (chronic obstructive pulmonary disease) ([place])     STates he has copd  . GERD (gastroesophageal reflux disease)    Past Surgical History  Procedure Laterality Date  . Abdominal surg for ulcers'  1980  . Distal aortogram    . Percutaneous transluminal angionplasty with placement of 2 self expanding stent in the distal left superfical femeral artery    . Colonoscopy N/A 09/22/2012    Procedure: COLONOSCOPY;  Surgeon: Gatha Mayer, MD;  Location: [facility];  Service: Endoscopy;  Laterality: N/A;   Family History  Problem Relation Age of Onset  . Alcohol abuse Father   . Kidney disease Brother   . Drug abuse Brother   . Diabetes Brother   . Colon cancer Neg Hx   . Diabetes Mother   . Diabetes Brother    History  Sexual Activity  . Sexual Activity: Not on file    Comment: [place]    Outpatient Encounter Prescriptions as of 10/16/2015  Medication Sig  . albuterol (PROVENTIL HFA;VENTOLIN HFA) 108 (90 BASE) MCG/ACT inhaler Inhale 2 puffs into the  lungs every 6 (six) hours as needed for wheezing.  Marland Kitchen aspirin 81 MG tablet Take 81 mg by mouth daily.    . clopidogrel (PLAVIX) 75 MG tablet Take 1 tablet (75 mg total) by mouth daily.  . ferrous sulfate 325 (65 FE) MG tablet Take 325 mg by mouth daily with breakfast.  . gabapentin (NEURONTIN) 300 MG capsule TAKE 1 CAPSULE BY MOUTH THREE TIMES DAILY.  . tamsulosin (FLOMAX) 0.4 MG CAPS capsule Take 1 capsule (0.4 mg total) by mouth daily.  Marland Kitchen triamcinolone cream (KENALOG) 0.1 % Apply 1 application topically 2 (two) times daily.  . vitamin C (ASCORBIC ACID) 500 MG tablet Take 500 mg by mouth daily.  . [DISCONTINUED] triamcinolone cream (KENALOG) 0.1 % Apply 1 application topically 2 (two) times daily.  Marland Kitchen sulfamethoxazole-trimethoprim (BACTRIM DS,SEPTRA DS) 800-160  MG tablet Take 1 tablet by mouth 2 (two) times daily.  . traMADol (ULTRAM) 50 MG tablet Take 1 tablet (50 mg total) by mouth every 6 (six) hours as needed. (Patient not taking: Reported on 10/16/2015)  . varenicline (CHANTIX) 0.5 MG tablet Take 1 tablet (0.5 mg total) by mouth 2 (two) times daily. (Patient not taking: Reported on 10/16/2015)  . [EXPIRED] methylPREDNISolone acetate (DEPO-MEDROL) injection 40 mg    No facility-administered encounter medications on file as of 10/16/2015.    Activities of Daily Living In your present state of health, do you have any difficulty performing the following activities: 10/16/2015  Hearing? N  Vision? N  Difficulty concentrating or making decisions? Y  Walking or climbing stairs? N  Dressing or bathing? N  Doing errands, shopping? N  Preparing Food and eating ? N  Using the Toilet? N  Managing your Medications? N  Managing your Finances? N  Housekeeping or managing your Housekeeping? N    Patient Care Team: [name], MD as PCP - General (Internal Medicine) Rigoberto Noel, MD (Pulmonary Disease) Rosetta Posner, MD (Vascular Surgery) Burnell Blanks, MD (Cardiology)     Assessment:    Mr. Brannon has a pleasant affect. MMSE was deferred due to time, as well as his difficulty reading my skew the result. Admits to recall being an issue; Registration of language and response accurate and without hesitation. Registration 3/3; Recall 0/3; states he does not write well so did not write a sentence but can follow commands; serial 7 from 100 x2;  MRI ordered to evaluated current balance issues; Compensates with iphone for dates etc.   Exercise Activities and Dietary recommendations Current Exercise Habits: Home exercise routine, Time (Minutes): 20, Frequency (Times/Week): 5, Weekly Exercise (Minutes/Week): 100, Intensity: Mild  Goals    . Exercise 3x per week (30 min per time)     Wants to get back on the golf;  As much as possible; If right hip and leg is better; will play 3 times per week    . patient     Get back on golf course; COPD is challenging; will go to the driving range and hit balls      Fall Risk Fall Risk  10/16/2015 10/16/2015 10/16/2015 07/25/2014 07/25/2014  Falls in the past year? No No No - Yes  Number falls in past yr: - - - - 1  Injury with Fall? - - - - No  Risk for fall due to : - - - Impaired mobility Other (Comment)  Risk for fall due to (comments): - - - has pain in right hip; can't do a lot of walking; to discuss with doctor; declines steriod shots/ somet times has to stop when walking Educated fall risk and getting up slow; concious of where feet are;   Follow up - - - - Education provided   Depression Screen PHQ 2/9 Scores 10/16/2015 10/16/2015 07/25/2014 11/26/2013  PHQ - 2 Score 0 0 0 0    Cognitive Testing MMSE - Mini Mental State Exam 10/16/2015 07/25/2014  Not completed: (No Data) -  Orientation to time - 5  Orientation to Place - 5  Registration - 3  Attention/ Calculation - 1  Recall - 2  Recall-comments - third with cue  Language- name 2 objects - 2  Language- repeat - 1  Language- follow 3 step command - 3  Language-  read & follow direction - 1  Write a sentence - 1  Copy  design - 1  Total score - 25   Deferred due to time but no noted failures; inc'd recall issues; but good historian;  No failures at task;   Immunization History  Administered Date(s) Administered  . Influenza Split 01/05/2012, 01/01/2013  . Influenza,inj,Quad PF,36+ Mos 12/21/2013, 12/20/2014  . PPD Test 09/11/2012  . Pneumococcal Conjugate-13 02/06/2013  . Pneumococcal Polysaccharide-23 04/17/2013  . Td 07/09/2007   Screening Tests Health Maintenance  Topic Date Due  . ZOSTAVAX  06/25/2001  . INFLUENZA VACCINE  11/11/2015  . TETANUS/TDAP  07/08/2017  . COLONOSCOPY  09/23/2022  . PNA vac Low Risk Adult  Completed      Plan:    Deaf & Hard of Hearing Division Services  No reviews  [employer] Office  [phone]  [phone] will pay for one hearing aid / needs for wife and would like for me to see her when she comes in next week;   Educated to check with insurance regarding coverage of Shingles vaccination on Part D or Part B and may have lower co-pay if provided on the Part D side  Will try to get eye exam this year  Wait on hearing screen for now; states he doesn't feel hearing is an issue.  Risk for falls being evaluated by Henry Carter.  Will defer Advanced Directives to wife when she comes in.   During the course of the visit the patient was educated and counseled about the following appropriate screening and preventive services:   Vaccines to include Pneumoccal, Influenza, Hepatitis B, Td, Zostavax, HCV  Electrocardiogram  Cardiovascular Disease  Colorectal cancer screening  Diabetes screening  Prostate Cancer Screening  Glaucoma screening  Nutrition counseling   Smoking cessation counseling  Patient Instructions (the written plan) was given to the patient.    Wynetta Fines, RN  10/16/2015

## 2015-10-16 NOTE — Assessment & Plan Note (Signed)
Spontaneously draining today. Rx for bactim 7 day course for resolution. Talked to him about ongoing sitz bath.

## 2015-10-16 NOTE — Progress Notes (Signed)
   Subjective:    Patient ID: Henry Carter, male    DOB: [birth year], 75 y.o.   MRN: [id number]  HPI The patient is a 75 YO man coming in for several problems today. The first is recurrent boils on his backside. He gets them every so often and they are painful. He has burst them by sitting on them recently. They still are sore with sitting. He has tried sitz baths with mild relief. Denies fevers or chills. Trying to keep the area clean.  Next concern is his dizziness. It is still present and worsening. He has had it for >6 months. Previous MRI with some concerns about NPH. Also with urinary urgency. Takes flomax and this helps most of the time, if he misses doses has had accident. Some memory problems and denies worsening of that recently. No headaches.  Next concern is left shoulder pain. Started about 2 months ago without cause. No injury or overuse. He has tried cream on the area which is mildly helpful. He is worried as the ROM is decreasing as he is not using it as much. He is able to passively stretch the arm up normally but gets severe pain if he tried to do that actively. No skin rash.   Review of Systems  Constitutional: Positive for activity change. Negative for fever, appetite change, fatigue and unexpected weight change.  HENT: Negative.   Eyes: Negative.   Respiratory: Negative for cough, chest tightness, shortness of breath and wheezing.   Cardiovascular: Negative for chest pain, palpitations and leg swelling.  Gastrointestinal: Negative.   Musculoskeletal: Positive for arthralgias. Negative for myalgias, back pain, gait problem and neck pain.  Skin: Positive for wound.  Neurological: Positive for dizziness. Negative for syncope, weakness, light-headedness, numbness and headaches.  Psychiatric/Behavioral: Negative.       Objective:   Physical Exam  Constitutional: He is oriented to person, place, and time. He appears well-developed and well-nourished.  HENT:  Head:  Normocephalic and atraumatic.  Eyes: EOM are normal.  Neck: Normal range of motion.  Cardiovascular: Normal rate and regular rhythm.   Pulmonary/Chest: Effort normal and breath sounds normal. No respiratory distress. He has no wheezes.  Abdominal: Soft. He exhibits no distension. There is no tenderness.  Genitourinary:  Boil on buttock, right cheek. Open with some fluctuance.  Musculoskeletal: He exhibits no edema.  Pain with active ROM left shoulder, passive ROM without pain. No pain in the neck or back or biceps or triceps muscles.   Neurological: He is alert and oriented to person, place, and time. Coordination normal.  Skin: Skin is warm and dry.   Filed Vitals:   10/16/15 0901  BP: 138/82  Pulse: 73  Temp: 97.7 F (36.5 C)  TempSrc: Oral  Resp: 12  Height: 5' 8.5" (1.74 m)  Weight: 164 lb (74.39 kg)  SpO2: 96%      Assessment & Plan:  Depo-medrol 40 mg given at visit

## 2015-10-16 NOTE — Progress Notes (Signed)
Pre visit review using our clinic review tool, if applicable. No additional management support is needed unless otherwise documented below in the visit note. 

## 2015-10-27 NOTE — Progress Notes (Signed)
Medical screening examination/treatment/procedure(s) were performed by non-physician practitioner and as supervising physician I was immediately available for consultation/collaboration. I agree with above. Elizabeth A Crawford, MD 

## 2015-11-06 ENCOUNTER — Ambulatory Visit
Admission: RE | Admit: 2015-11-06 | Discharge: 2015-11-06 | Disposition: A | Discharge status: Home / Self Care | Payer: Commercial Managed Care - HMO | Source: Ambulatory Visit | Attending: Internal Medicine | Admitting: Internal Medicine

## 2015-11-06 DIAGNOSIS — R42 Dizziness and giddiness: Secondary | ICD-10-CM | POA: Diagnosis not present

## 2015-11-07 ENCOUNTER — Other Ambulatory Visit: Payer: Self-pay | Admitting: Internal Medicine

## 2015-11-07 DIAGNOSIS — G912 (Idiopathic) normal pressure hydrocephalus: Secondary | ICD-10-CM

## 2015-11-18 ENCOUNTER — Ambulatory Visit (INDEPENDENT_AMBULATORY_CARE_PROVIDER_SITE_OTHER): Payer: Commercial Managed Care - HMO | Admitting: Family Medicine

## 2015-11-18 ENCOUNTER — Encounter: Payer: Self-pay | Admitting: Family Medicine

## 2015-11-18 ENCOUNTER — Other Ambulatory Visit: Payer: Self-pay

## 2015-11-18 VITALS — BP 140/90 | HR 82 | Wt 166.0 lb

## 2015-11-18 DIAGNOSIS — M12512 Traumatic arthropathy, left shoulder: Secondary | ICD-10-CM

## 2015-11-18 DIAGNOSIS — M25512 Pain in left shoulder: Secondary | ICD-10-CM

## 2015-11-18 DIAGNOSIS — M12812 Other specific arthropathies, not elsewhere classified, left shoulder: Secondary | ICD-10-CM | POA: Insufficient documentation

## 2015-11-18 DIAGNOSIS — M75102 Unspecified rotator cuff tear or rupture of left shoulder, not specified as traumatic: Secondary | ICD-10-CM

## 2015-11-18 NOTE — Patient Instructions (Signed)
Good to see you  Ice 20 minutes 2 times daily. Usually after activity and before bed. Exercises 3 times a week.  pennsaid pinkie amount topically 2 times daily as needed.  Vitamin D 2000 IU daily  Turmeric 500mg  daily  Avoid any activity with your hands outside your peripheral vision See em again in 4 weeks and if not better we will discuss PT and possible injection.

## 2015-11-18 NOTE — Assessment & Plan Note (Signed)
Patient does have some bursitis, rotator cuff degenerative changes, as well as some underlying arthritis. We discussed with patient at great length. Patient has elected try conservative therapy with home exercise, icing protocol, as well as topical anti-inflammatories. We'll avoid oral anti-inflammatories secondary to patient's history of AVM. We discussed over-the-counter medications a can be beneficial. Discussed which activities to avoid. Patient will follow-up again in 4 weeks. At that time if worsening symptoms we'll consider injection as well as referral for formal physical therapy.

## 2015-11-18 NOTE — Progress Notes (Signed)
[facility] [name], [place] [address] Phone: [phone] Subjective:    I'm seeing this patient by the request  of:  [name], MD  CC: left shoulder pain  [id number]  Henry Carter is a 75 y.o. male coming in with complaint of left shoulder pain.  Has been months. Does not remember any true injury. States that over the course of time has more discomfort. Seems to have some limitation in strength as well. States localized to the shoulder. No radiation down the arm or any numbness. Denies any associated neck pain. Patient states that lifting anything greater than 10 pounds is almost impossible now. Some discomfort sleeping at night but does not wake him up. Rates the severity of pain a 7 out of 10.    Past Medical History:  Diagnosis Date  .  iron deficiency 01/18/2013  . Anemia   . Angioedema   . BPH (benign prostatic hyperplasia)   . COPD (chronic obstructive pulmonary disease) ([place])    STates he has copd  . Essential hypertension   . GERD (gastroesophageal reflux disease)   . Helicobacter pylori gastritis 10/10/2012  . Hyperlipidemia   . IBS (irritable bowel syndrome)   . Impotence   . Peripheral vascular disease, unspecified ([place])   . Personal history of colonic adenoma 09/22/2012  . Personal history of tobacco use, presenting hazards to health    Past Surgical History:  Procedure Laterality Date  . abdominal surg for ulcers'  1980  . COLONOSCOPY N/A 09/22/2012   Procedure: COLONOSCOPY;  Surgeon: Gatha Mayer, MD;  Location: [facility];  Service: Endoscopy;  Laterality: N/A;  . distal aortogram    . percutaneous transluminal angionplasty with placement of 2 self expanding stent in the distal left superfical femeral artery     Social History   Social History  . Marital status: Married    Spouse name: N/A  . Number of children: N/A  . Years of education: N/A   Social History Main Topics  . Smoking status:  Former Smoker    Packs/day: 0.25    Years: 50.00    Types: Cigarettes    Start date: 04/12/1957    Quit date: 04/12/2014  . Smokeless tobacco: Never Used     Comment: using chatix; stopped now; took x 1 week / had stopped 3 years prior to starting back in 2005  . Alcohol use No     Comment: OCC  . Drug use:      Comment: States marijuanna relaxes him;   . Sexual activity: Not Asked     Comment: marijuanna   Other Topics Concern  . 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 Ace Inhibitors     REACTION: antioedema  . Penicillins Hives    Has patient had a PCN reaction causing immediate rash, facial/tongue/throat swelling, SOB or lightheadedness with hypotension: No Has patient had a PCN reaction causing severe rash involving mucus membranes or skin necrosis: No Has patient had a PCN reaction that required hospitalization yes Has patient had a PCN reaction occurring within the last 10 years: no If all of the above answers are "NO", then may proceed with Cephalosporin use.   Family History  Problem Relation Age of Onset  . Alcohol abuse Father   .  Kidney disease Brother   . Drug abuse Brother   . Diabetes Brother   . Colon cancer Neg Hx   . Diabetes Mother   . Diabetes Brother     Past medical history, social, surgical and family history all reviewed in electronic medical record.  No pertanent information unless stated regarding to the chief complaint.   Review of Systems: No headache, visual changes, nausea, vomiting, diarrhea, constipation, dizziness, abdominal pain, skin rash, fevers, chills, night sweats, weight loss, swollen lymph nodes, body aches, joint swelling, muscle aches, chest pain, shortness of breath, mood changes.   Objective  Blood pressure 140/90, pulse 82, weight 166 lb (75.3 kg), SpO2 94 %.  General: No  apparent distress alert and oriented x3 mood and affect normal, dressed appropriately.  HEENT: Pupils equal, extraocular movements intact  Respiratory: Patient's speak in full sentences and does not appear short of breath  Cardiovascular: No lower extremity edema, non tender, no erythema  Skin: Warm dry intact with no signs of infection or rash on extremities or on axial skeleton.  Abdomen: Soft nontender  Neuro: Cranial nerves II through XII are intact, neurovascularly intact in all extremities with 2+ DTRs and 2+ pulses.  Lymph: No lymphadenopathy of posterior or anterior cervical chain or axillae bilaterally.  Gait normal with good balance and coordination.  MSK:  Non tender with full range of motion and good stability and symmetric strength and tone of  elbows, wrist, hip, knee and ankles bilaterally.  Shoulder: left Inspection reveals no abnormalities, atrophy or asymmetry. Palpation is normal with no tenderness over AC joint or bicipital groove. Lacks last 5 of external rotation. Mild crepitus noted on exam Rotator cuff strength 4 out of 5 compared to 5 out of 5 on the contralateral side signs of impingement with positive Neer and Hawkin's tests, but negative empty can sign. Speeds and Yergason's tests normal. Positive labral pathology with O'Brien Normal scapular function observed. Positive painful arc No apprehension sign Contralateral shoulder unremarkable  MSK US performed of: left This study was ordered, performed, and interpreted by Charlann Boxer D.O.  Shoulder:   Supraspinatus: degenerative changes noted but no true tearpositive bursitis noted Infraspinatus:  Atrophy noted Subscapularis:atrophy noted with positive bursitis and degenerative tearing. AC joint:  Moderate arthritis Glenohumeral Joint:  Moderate arthritis Glenoid Labrum:  Intact without visualized tears. Biceps Tendon:  Appears normal on long and transverse views, no fraying of tendon, tendon located in  intertubercular groove, no subluxation with shoulder internal or external rotation.  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      Note: This dictation was prepared with Dragon dictation along with smaller phrase technology. Any transcriptional errors that result from this process are unintentional.

## 2015-11-21 ENCOUNTER — Ambulatory Visit: Payer: Commercial Managed Care - HMO | Admitting: Neurology

## 2015-11-21 DIAGNOSIS — Z029 Encounter for administrative examinations, unspecified: Secondary | ICD-10-CM

## 2015-12-11 ENCOUNTER — Ambulatory Visit (INDEPENDENT_AMBULATORY_CARE_PROVIDER_SITE_OTHER): Payer: Commercial Managed Care - HMO | Admitting: Neurology

## 2015-12-11 ENCOUNTER — Other Ambulatory Visit: Payer: Self-pay | Admitting: Internal Medicine

## 2015-12-11 ENCOUNTER — Encounter: Payer: Self-pay | Admitting: Neurology

## 2015-12-11 VITALS — BP 130/80 | HR 68 | Ht 68.5 in | Wt 167.2 lb

## 2015-12-11 DIAGNOSIS — G912 (Idiopathic) normal pressure hydrocephalus: Secondary | ICD-10-CM | POA: Diagnosis not present

## 2015-12-11 DIAGNOSIS — G3184 Mild cognitive impairment, so stated: Secondary | ICD-10-CM | POA: Diagnosis not present

## 2015-12-11 NOTE — Patient Instructions (Addendum)
Great to see you. Follow-up in 4 months. If symptoms change in between, please call and we would proceed with spinal tap as discussed.

## 2015-12-11 NOTE — Progress Notes (Addendum)
NEUROLOGY FOLLOW UP OFFICE NOTE  Henry Carter IY:[id number]  HISTORY OF PRESENT ILLNESS: I had the pleasure of seeing Henry Carter in follow-up in the neurology clinic on 12/11/2015. 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 He returns today with complaint of lightheadedness. He had a repeat MRI brain without contrast done 11/06/15 which I personally reviewed, the diffuse ventricular enlargement shows mild progression. Dilated aqueduct shows progression. Mild hyperintensity in the periventricular white matter has progressed and may represent transependymal absorption of CSF related to NPH. There were no acute changes, there was mild chronic microvascular disease. He reports that his walking is a lot slower, he staggers a lot and is still wobbling but denies any falls. He describes the dizziness as lightheadedness that occurs when he gets up in the morning, quieting down as he starts moving around, able to cut grass without symptoms, then in the evening on his recliner, the lightheadedness returns. He reports marijuana calms him down a lot. There is no nausea, vomiting, focal numbness/tingling/weakness. He reports urinary urgency, and having accidents if he does not get to the bathroom soon enough. He feels that his prostate medication helps with this. His memory has worsened, he got lost one time driving and had to call his wife. He forgets his medications, especially the morning ones. His wife is in charge of bills.  HPI:  This is a pleasant 75 yo RH man with a history of hypertension, hyperlipidemia, peripheral vascular disease s/p bilateral stent in LE, BPH, who presented for gait instability that he describes as "staggering." He had initially reported these symptoms to his previous PCP [name] in March 2015, where he reported staggering worse in the morning, balance being off, and several months of tinnitus without hearing loss. I  reviewed MRI brain without contrast done at that time which did not show any acute changes, however note of ventricular prominence slightly out of proportion to sulcal prominence, mild hydrocephalus could not be completely excluded. Aqueduct noted to be patent. He and his wife report that at that time, staggering was not as bad, since August 2015, his wife has noticed a significant difference. He would walk down the hallway and go against the wall, close to falling but always catching himself. His wife describes him as appearing like he is "under the influence of something." His speech has been affected as well, where it would become slow. He describes dizziness as lightheadedness, he feels like he is floating and would rather sit in his recliner, although still feeling the same sensation even if sitting down. His wife reports that he is very active, but over the past 3 months has been more sleepy, he fades out and just goes to sleep. He has been having new onset headaches with a pressure sensation over his forehead lasting 15 minutes or so, with no associated nausea, vomiting, photo or phonophobia. He does not take any medication for this. He continues to have the constant tinnitus. He has occasional hand tremors that do not affect his daily activities except his writing is more sloppy.   He feels his memory has been worsening, he forgets conversations easily. He has been forgetting to take his medications "just about everyday" for the past 3 years. He reports an episode 6 months ago when he woke up with his mouth twisted, tongue numb, with slurred speech. He drove to the store then went to his nephew's house and drove the wrong way.  He has gotten turned around driving a couple of times, 2 months ago he forgot how to get somewhere he goes to frequently. One time his wife had to drive down the road looking for him. His wife has always been in charge of bills. No family history of memory problems.   He denies any  anosmia, constipation, REM behavior disorder but has sleep jerks that wake him up. He denies any diplopia, blurred vision, dysphagia, bowel/bladder incontinence, some urinary urgency with the BPH. He has occasional neck and back stiffness. There is no family history of similar symptoms. He has been taking gabapentin for cramps with good effect.   PAST MEDICAL HISTORY: Past Medical History:  Diagnosis Date  .  iron deficiency 01/18/2013  . Anemia   . Angioedema   . BPH (benign prostatic hyperplasia)   . COPD (chronic obstructive pulmonary disease) ([place])    STates he has copd  . Essential hypertension   . GERD (gastroesophageal reflux disease)   . Helicobacter pylori gastritis 10/10/2012  . Hyperlipidemia   . IBS (irritable bowel syndrome)   . Impotence   . Peripheral vascular disease, unspecified ([place])   . Personal history of colonic adenoma 09/22/2012  . Personal history of tobacco use, presenting hazards to health     MEDICATIONS: Current Outpatient Prescriptions on File Prior to Visit  Medication Sig Dispense Refill  . albuterol (PROVENTIL HFA;VENTOLIN HFA) 108 (90 BASE) MCG/ACT inhaler Inhale 2 puffs into the lungs every 6 (six) hours as needed for wheezing. 1 Inhaler 3  . aspirin 81 MG tablet Take 81 mg by mouth daily.      . clopidogrel (PLAVIX) 75 MG tablet Take 1 tablet (75 mg total) by mouth daily. 90 tablet 3  . ferrous sulfate 325 (65 FE) MG tablet Take 325 mg by mouth daily with breakfast.    . gabapentin (NEURONTIN) 300 MG capsule TAKE 1 CAPSULE BY MOUTH THREE TIMES DAILY. 270 capsule 0  . sulfamethoxazole-trimethoprim (BACTRIM DS,SEPTRA DS) 800-160 MG tablet Take 1 tablet by mouth 2 (two) times daily. 14 tablet 0  . tamsulosin (FLOMAX) 0.4 MG CAPS capsule Take 1 capsule (0.4 mg total) by mouth daily. 90 capsule 0  . traMADol (ULTRAM) 50 MG tablet Take 1 tablet (50 mg total) by mouth every 6 (six) hours as needed. 10 tablet 0  . triamcinolone cream (KENALOG) 0.1 % Apply 1  application topically 2 (two) times daily. 30 g 6  . varenicline (CHANTIX) 0.5 MG tablet Take 1 tablet (0.5 mg total) by mouth 2 (two) times daily. 60 tablet 0  . vitamin C (ASCORBIC ACID) 500 MG tablet Take 500 mg by mouth daily.     No current facility-administered medications on file prior to visit.     ALLERGIES: Allergies  Allergen Reactions  . Ace Inhibitors     REACTION: antioedema  . Penicillins Hives    Has patient had a PCN reaction causing immediate rash, facial/tongue/throat swelling, SOB or lightheadedness with hypotension: No Has patient had a PCN reaction causing severe rash involving mucus membranes or skin necrosis: No Has patient had a PCN reaction that required hospitalization yes Has patient had a PCN reaction occurring within the last 10 years: no If all of the above answers are "NO", then may proceed with Cephalosporin use.    FAMILY HISTORY: Family History  Problem Relation Age of Onset  . Alcohol abuse Father   . Kidney disease Brother   . Drug abuse Brother   . Diabetes Brother   .  Diabetes Mother   . Diabetes Brother   . Colon cancer Neg Hx     SOCIAL HISTORY: Social History   Social History  . Marital status: Married    Spouse name: N/A  . Number of children: N/A  . Years of education: N/A   Occupational History  . Not on file.   Social History Main Topics  . Smoking status: Former Smoker    Packs/day: 0.25    Years: 50.00    Types: Cigarettes    Start date: 04/12/1957    Quit date: 04/12/2014  . Smokeless tobacco: Never Used     Comment: using chatix; stopped now; took x 1 week / had stopped 3 years prior to starting back in 2005  . Alcohol use No     Comment: OCC  . Drug use:      Comment: States marijuanna relaxes him;   . Sexual activity: Not on file     Comment: marijuanna   Other Topics Concern  . 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)   Alcohol  use- occasionally   No illict drugs    REVIEW OF SYSTEMS: Constitutional: No fevers, chills, or sweats, no generalized fatigue, change in appetite Eyes: No visual changes, double vision, eye pain Ear, nose and throat: No hearing loss, ear pain, nasal congestion, sore throat Cardiovascular: No chest pain, palpitations Respiratory:  No shortness of breath at rest or with exertion, wheezes GastrointestinaI: No nausea, vomiting, diarrhea, abdominal pain, fecal incontinence Genitourinary:  No dysuria, urinary retention, +frequency Musculoskeletal:  + occl neck pain, back pain Integumentary: No rash, pruritus, skin lesions Neurological: as above Psychiatric: No depression, insomnia, anxiety Endocrine: No palpitations, fatigue, diaphoresis, mood swings, change in appetite, change in weight, increased thirst Hematologic/Lymphatic:  No anemia, purpura, petechiae. Allergic/Immunologic: no itchy/runny eyes, nasal congestion, recent allergic reactions, rashes  PHYSICAL EXAM: Vitals:   12/11/15 0824  BP: 130/80  Pulse: 68   General: No acute distress Head:  Normocephalic/atraumatic Neck: supple, no paraspinal tenderness, full range of motion Heart:  Regular rate and rhythm Lungs:  Clear to auscultation bilaterally Back: No paraspinal tenderness Skin/Extremities: No rash, no edema Neurological Exam: alert and oriented to person, place, and time. No aphasia or dysarthria. Fund of knowledge is appropriate.  Remote memory intact.  Attention and concentration are normal.    Able to name objects and repeat phrases.  MMSE - Mini Mental State Exam 12/11/2015 10/16/2015 07/25/2014  Not completed: - (No Data) -  Orientation to time 4 - 5  Orientation to Place 5 - 5  Registration 3 - 3  Attention/ Calculation 2 - 1  Recall 2 - 2  Recall-comments - - third with cue  Language- name 2 objects 2 - 2  Language- repeat 1 - 1  Language- follow 3 step command 3 - 3  Language- read & follow direction 1 - 1    Write a sentence 1 - 1  Copy design 1 - 1  Total score 25 - 25   Cranial nerves: Pupils equal, round, reactive to light. Extraocular movements intact with no nystagmus. Visual fields full. Facial sensation intact. No facial asymmetry. Tongue, uvula, palate midline.  Motor: Bulk and tone normal, no cogwheeling, muscle strength 5/5 throughout with no pronator drift. Occasional fasciculations seen on the left forearm. Sensation: decreased vibration up to ankles bilaterally, intact to light touch (similar to prior). No extinction to  double simultaneous stimulation. Romberg test negative Deep Tendon Reflexes: +1 both UE, brisk +3 right patella, +2 left patella and both ankles (similar to prior). no ankle clonus. Plantar responses: downgoing bilaterally. Cerebellar: no incoordination on finger to nose testing. Gait: able to stand with arms crossed over chest, slightly wide-based, with decreased left foot clearance, no magnetic gait seen. good arm swing. Tremor: none   IMPRESSION: This is a pleasant 75 yo RH man with a history of hypertension, hyperdlipidemia, BPH, peripheral vascular disease, who presented 2 years ago with worsening balance problems with note of episodes of change in speech. He presents today for slight progression of ventricular enlargement on MRI concerning for NPH. Findings were discussed with the patient, his MMSE today is unchanged, 25/30. His gait is slightly wide-based but not the classic magnetic gait. We discussed urinary issues. We also discussed doing a large volume tap for NPH, he is very hesitant and does not want to proceed. He would like to discuss urinary medication first with his PCP, and understands that if symptoms worsen, would recommend proceeding with large volume spinal tap. We also discussed the dizziness, unlikely related to MRI findings, more suggestive of orthostasis with lightheadedness upon getting out of bed, continue to monitor. He will follow-up in 4 months and  knows to call for any changes.   Thank you for allowing me to participate in his care.  Please do not hesitate to call for any questions or concerns.  The duration of this appointment visit was 25 minutes of face-to-face time with the patient.  Greater than 50% of this time was spent in counseling, explanation of diagnosis, planning of further management, and coordination of care.   Ellouise Newer, M.D.   CC: Dr. [name]

## 2015-12-23 ENCOUNTER — Ambulatory Visit (INDEPENDENT_AMBULATORY_CARE_PROVIDER_SITE_OTHER): Payer: Commercial Managed Care - HMO

## 2015-12-23 DIAGNOSIS — Z23 Encounter for immunization: Secondary | ICD-10-CM | POA: Diagnosis not present

## 2016-01-09 ENCOUNTER — Encounter: Payer: Self-pay | Admitting: Family

## 2016-01-12 ENCOUNTER — Encounter: Payer: Self-pay | Admitting: Family

## 2016-01-12 ENCOUNTER — Ambulatory Visit (HOSPITAL_COMMUNITY)
Admission: RE | Admit: 2016-01-12 | Discharge: 2016-01-12 | Disposition: A | Discharge status: Home / Self Care | Payer: Commercial Managed Care - HMO | Source: Ambulatory Visit | Attending: Family | Admitting: Family

## 2016-01-12 ENCOUNTER — Ambulatory Visit (INDEPENDENT_AMBULATORY_CARE_PROVIDER_SITE_OTHER): Payer: Commercial Managed Care - HMO | Admitting: Family

## 2016-01-12 VITALS — BP 140/88 | HR 74 | Temp 97.4°F | Resp 18 | Ht 68.0 in | Wt 168.0 lb

## 2016-01-12 DIAGNOSIS — Z7722 Contact with and (suspected) exposure to environmental tobacco smoke (acute) (chronic): Secondary | ICD-10-CM

## 2016-01-12 DIAGNOSIS — I779 Disorder of arteries and arterioles, unspecified: Secondary | ICD-10-CM | POA: Diagnosis not present

## 2016-01-12 DIAGNOSIS — Z959 Presence of cardiac and vascular implant and graft, unspecified: Secondary | ICD-10-CM

## 2016-01-12 DIAGNOSIS — Z87891 Personal history of nicotine dependence: Secondary | ICD-10-CM | POA: Insufficient documentation

## 2016-01-12 NOTE — Patient Instructions (Signed)
Peripheral Vascular Disease Peripheral vascular disease (PVD) is a disease of the blood vessels that are not part of your heart and brain. A simple term for PVD is poor circulation. In most cases, PVD narrows the blood vessels that carry blood from your heart to the rest of your body. This can result in a decreased supply of blood to your arms, legs, and internal organs, like your stomach or kidneys. However, it most often affects a person's lower legs and feet. There are two types of PVD.  Organic PVD. This is the more common type. It is caused by damage to the structure of blood vessels.  Functional PVD. This is caused by conditions that make blood vessels contract and tighten (spasm). Without treatment, PVD tends to get worse over time. PVD can also lead to acute ischemic limb. This is when an arm or limb suddenly has trouble getting enough blood. This is a medical emergency. CAUSES Each type of PVD has many different causes. The most common cause of PVD is buildup of a fatty material (plaque) inside of your arteries (atherosclerosis). Small amounts of plaque can break off from the walls of the blood vessels and become lodged in a smaller artery. This blocks blood flow and can cause acute ischemic limb. Other common causes of PVD include:  Blood clots that form inside of blood vessels.  Injuries to blood vessels.  Diseases that cause inflammation of blood vessels or cause blood vessel spasms.  Health behaviors and health history that increase your risk of developing PVD. RISK FACTORS  You may have a greater risk of PVD if you:  Have a family history of PVD.  Have certain medical conditions, including:  High cholesterol.  Diabetes.  High blood pressure (hypertension).  Coronary heart disease.  Past problems with blood clots.  Past injury, such as burns or a broken bone. These may have damaged blood vessels in your limbs.  Buerger disease. This is caused by inflamed blood  vessels in your hands and feet.  Some forms of arthritis.  Rare birth defects that affect the arteries in your legs.  Use tobacco.  Do not get enough exercise.  Are obese.  Are age 50 or older. SIGNS AND SYMPTOMS  PVD may cause many different symptoms. Your symptoms depend on what part of your body is not getting enough blood. Some common signs and symptoms include:  Cramps in your lower legs. This may be a symptom of poor leg circulation (claudication).  Pain and weakness in your legs while you are physically active that goes away when you rest (intermittent claudication).  Leg pain when at rest.  Leg numbness, tingling, or weakness.  Coldness in a leg or foot, especially when compared with the other leg.  Skin or hair changes. These can include:  Hair loss.  Shiny skin.  Pale or bluish skin.  Thick toenails.  Inability to get or maintain an erection (erectile dysfunction). People with PVD are more prone to developing ulcers and sores on their toes, feet, or legs. These may take longer than normal to heal. DIAGNOSIS Your health care provider may diagnose PVD from your signs and symptoms. The health care provider will also do a physical exam. You may have tests to find out what is causing your PVD and determine its severity. Tests may include:  Blood pressure recordings from your arms and legs and measurements of the strength of your pulses (pulse volume recordings).  Imaging studies using sound waves to take pictures of   the blood flow through your blood vessels (Doppler ultrasound).  Injecting a dye into your blood vessels before having imaging studies using:  X-rays (angiogram or arteriogram).  Computer-generated X-rays (CT angiogram).  A powerful electromagnetic field and a computer (magnetic resonance angiogram or MRA). TREATMENT Treatment for PVD depends on the cause of your condition and the severity of your symptoms. It also depends on your age. Underlying  causes need to be treated and controlled. These include long-lasting (chronic) conditions, such as diabetes, high cholesterol, and high blood pressure. You may need to first try making lifestyle changes and taking medicines. Surgery may be needed if these do not work. Lifestyle changes may include:  Quitting smoking.  Exercising regularly.  Following a low-fat, low-cholesterol diet. Medicines may include:  Blood thinners to prevent blood clots.  Medicines to improve blood flow.  Medicines to improve your blood cholesterol levels. Surgical procedures may include:  A procedure that uses an inflated balloon to open a blocked artery and improve blood flow (angioplasty).  A procedure to put in a tube (stent) to keep a blocked artery open (stent implant).  Surgery to reroute blood flow around a blocked artery (peripheral bypass surgery).  Surgery to remove dead tissue from an infected wound on the affected limb.  Amputation. This is surgical removal of the affected limb. This may be necessary in cases of acute ischemic limb that are not improved through medical or surgical treatments. HOME CARE INSTRUCTIONS  Take medicines only as directed by your health care provider.  Do not use any tobacco products, including cigarettes, chewing tobacco, or electronic cigarettes. If you need help quitting, ask your health care provider.  Lose weight if you are overweight, and maintain a healthy weight as directed by your health care provider.  Eat a diet that is low in fat and cholesterol. If you need help, ask your health care provider.  Exercise regularly. Ask your health care provider to suggest some good activities for you.  Use compression stockings or other mechanical devices as directed by your health care provider.  Take good care of your feet.  Wear comfortable shoes that fit well.  Check your feet often for any cuts or sores. SEEK MEDICAL CARE IF:  You have cramps in your legs  while walking.  You have leg pain when you are at rest.  You have coldness in a leg or foot.  Your skin changes.  You have erectile dysfunction.  You have cuts or sores on your feet that are not healing. SEEK IMMEDIATE MEDICAL CARE IF:  Your arm or leg turns cold and blue.  Your arms or legs become red, warm, swollen, painful, or numb.  You have chest pain or trouble breathing.  You suddenly have weakness in your face, arm, or leg.  You become very confused or lose the ability to speak.  You suddenly have a very bad headache or lose your vision.   This information is not intended to replace advice given to you by your health care provider. Make sure you discuss any questions you have with your health care provider.   Document Released: 05/06/2004 Document Revised: 04/19/2014 Document Reviewed: 09/06/2013 Elsevier Interactive Patient Education 2016 [employer]       Secondhand Smoke WHAT IS SECONDHAND SMOKE? Secondhand smoke is smoke that comes from burning tobacco. It could be the smoke from a cigarette, a pipe, or a cigar. Even if you are not the one smoking, secondhand smoke exposes you to the dangers of   smoking. This is called involuntary, or passive, smoking. There are two types of secondhand smoke:  Sidestream smoke is the smoke that comes off the lighted end of a cigarette, pipe, or cigar.  This type of smoke has the highest amount of cancer-causing agents (carcinogens).  The particles in sidestream smoke are smaller. They get into your lungs more easily.  Mainstream smoke is the smoke that is exhaled by a person who is smoking.  This type of smoke is also dangerous to your health. HOW CAN SECONDHAND SMOKE AFFECT MY HEALTH? Studies show that there is no safe level of secondhand smoke. This smoke contains thousands of chemicals. At least 69 of them are known to cause cancer. Secondhand smoke can also cause many other health problems. It has been linked  to:  Lung cancer.  Cancer of the voice box (larynx) or throat.  Cancer of the sinuses.  Brain cancer.  Bladder cancer.  Stomach cancer.  Breast cancer.  White blood cell cancers (lymphoma and leukemia).  Brain and liver tumors in children.  Heart disease and stroke in adults.  Pregnancy loss (miscarriage).  Diseases in children, such as:  Asthma.  Lung infections.  Ear infections.  Sudden infant death syndrome (SIDS).  Slow growth. WHERE CAN I BE AT RISK FOR EXPOSURE TO SECONDHAND SMOKE?   For adults, the workplace is the main source of exposure to secondhand smoke.  Your workplace should have a policy separating smoking areas from nonsmoking areas.  Smoking areas should have a system for ventilating and cleaning the air.  For children, the home may be the most dangerous place for exposure to secondhand smoke.  Children who live in apartment buildings may be at risk from smoke drifting from hallways or other people's homes.  For everyone, many public places are possible sources of exposure to secondhand smoke.  These places include restaurants, shopping centers, and parks. HOW CAN I REDUCE MY RISK FOR EXPOSURE TO SECONDHAND SMOKE? The most important thing you can do is not smoke. Discourage family members from smoking. Other ways to reduce exposure for you and your family include the following:  Keep your home smoke free.  Make sure your child care providers do not smoke.  Warn your child about the dangers of smoking and secondhand smoke.  Do not allow smoking in your car. When someone smokes in a car, all the damaging chemicals from the smoke are confined in a small area.  Avoid public places where smoking is allowed.   This information is not intended to replace advice given to you by your health care provider. Make sure you discuss any questions you have with your health care provider.   Document Released: 05/06/2004 Document Revised: 04/19/2014  Document Reviewed: 07/13/2013 Elsevier Interactive Patient Education 2016 [employer]  

## 2016-01-12 NOTE — Progress Notes (Signed)
[facility] [place]   CC: Follow up peripheral artery occlusive disease  History of Present Illness Henry Carter is a 75 y.o. male patient of Dr. Trula Slade whom he has seen for bilateral claudication. Pt initially underwent subintimal recanalization with subsequent stenting on 12/18/2008 by Dr. Julianne Handler. Dr. Trula Slade performed a subintimal recanalization with subsequent stenting of the right leg in November of 2010. At that time, the patient was having claudication symptoms at 50 feet without rest pain or ulceration. His symptoms have resolved. The patient returns today for follow up. He does complain of some numbness in his legs. He is taking Neurontin.  He also reports vertigo, states his PCP is aware.   Pt states he had a TIA about February 2016 as manifested by left facial numbness and losing his sense of direction when he was driving; this resolved and has not recurred. Pt states he told his PCP about this. Pt states he has had a couple of CT of the head since then.  Pt states he has had intermittent left side chest pain for several months, accompanied by resting dyspnea, denies diaphoresis, denies radiation of pain. He does report left shoulder pain with no accompanying chest pain.   Pt Diabetic: No Pt smoker: former smoker, quit in early 2016, smoked since 1959, stopped for a few years and resumed. He is, however, exposed in his home to his wife's secondhand smoke.   Pt meds include: Statin :No Betablocker: No ASA: Yes, states he takes about 3 days/week Other anticoagulants/antiplatelets: He stopped taking the Plavix, states he forgets to take   Past Medical History:  Diagnosis Date  .  iron deficiency 01/18/2013  . Anemia   . Angioedema   . BPH (benign prostatic hyperplasia)   . COPD (chronic obstructive pulmonary disease) ([place])    STates he has copd  . Essential hypertension   . GERD (gastroesophageal reflux disease)   . Helicobacter pylori  gastritis 10/10/2012  . Hyperlipidemia   . IBS (irritable bowel syndrome)   . Impotence   . Peripheral vascular disease, unspecified   . Personal history of colonic adenoma 09/22/2012  . Personal history of tobacco use, presenting hazards to health     Social History Social History  Substance Use Topics  . Smoking status: Former Smoker    Packs/day: 0.25    Years: 50.00    Types: Cigarettes    Start date: 04/12/1957    Quit date: 04/12/2014  . Smokeless tobacco: Never Used     Comment: using chatix; stopped now; took x 1 week / had stopped 3 years prior to starting back in 2005  . Alcohol use No     Comment: OCC    Family History Family History  Problem Relation Age of Onset  . Alcohol abuse Father   . Kidney disease Brother   . Drug abuse Brother   . Diabetes Brother   . Diabetes Mother   . Diabetes Brother   . Colon cancer Neg Hx     Past Surgical History:  Procedure Laterality Date  . abdominal surg for ulcers'  1980  . COLONOSCOPY N/A 09/22/2012   Procedure: COLONOSCOPY;  Surgeon: Gatha Mayer, MD;  Location: [facility];  Service: Endoscopy;  Laterality: N/A;  . distal aortogram    . percutaneous transluminal angionplasty with placement of 2 self expanding stent in the distal left superfical femeral artery      Allergies  Allergen Reactions  . Ace Inhibitors  REACTION: antioedema  . Penicillins Hives    Has patient had a PCN reaction causing immediate rash, facial/tongue/throat swelling, SOB or lightheadedness with hypotension: No Has patient had a PCN reaction causing severe rash involving mucus membranes or skin necrosis: No Has patient had a PCN reaction that required hospitalization yes Has patient had a PCN reaction occurring within the last 10 years: no If all of the above answers are "NO", then may proceed with Cephalosporin use.    Current Outpatient Prescriptions  Medication Sig Dispense Refill  . albuterol (PROVENTIL HFA;VENTOLIN HFA) 108 (90  BASE) MCG/ACT inhaler Inhale 2 puffs into the lungs every 6 (six) hours as needed for wheezing. 1 Inhaler 3  . aspirin 81 MG tablet Take 81 mg by mouth daily.      . clopidogrel (PLAVIX) 75 MG tablet TAKE 1 TABLET(75 MG) BY MOUTH DAILY 90 tablet 0  . doxazosin (CARDURA) 4 MG tablet TAKE 1 TABLET BY MOUTH TWICE DAILY 180 tablet 0  . ferrous sulfate 325 (65 FE) MG tablet Take 325 mg by mouth daily with breakfast.    . gabapentin (NEURONTIN) 300 MG capsule TAKE 1 CAPSULE BY MOUTH THREE TIMES DAILY. 270 capsule 0  . sulfamethoxazole-trimethoprim (BACTRIM DS,SEPTRA DS) 800-160 MG tablet Take 1 tablet by mouth 2 (two) times daily. 14 tablet 0  . tamsulosin (FLOMAX) 0.4 MG CAPS capsule Take 1 capsule (0.4 mg total) by mouth daily. 90 capsule 0  . traMADol (ULTRAM) 50 MG tablet Take 1 tablet (50 mg total) by mouth every 6 (six) hours as needed. 10 tablet 0  . triamcinolone cream (KENALOG) 0.1 % Apply 1 application topically 2 (two) times daily. 30 g 6  . varenicline (CHANTIX) 0.5 MG tablet Take 1 tablet (0.5 mg total) by mouth 2 (two) times daily. 60 tablet 0  . vitamin C (ASCORBIC ACID) 500 MG tablet Take 500 mg by mouth daily.     No current facility-administered medications for this visit.     ROS: See HPI for pertinent positives and negatives.   Physical Examination  Vitals:   01/12/16 1550  BP: 140/88  Pulse: 74  Resp: 18  Temp: 97.4 F (36.3 C)  TempSrc: Oral  SpO2: 98%  Weight: 168 lb (76.2 kg)  Height: 5\' 8"  (1.727 m)   Body mass index is 25.54 kg/m.  General: A&O x 3, WDWN. Gait: normal Eyes: PERRLA. Pulmonary: Respirations are non labored, CTAB, without wheezes, rales, or rhonchi. Cardiac: regular rhythm and rate, no detected murmur.         Carotid Bruits Right Left   Negative Negative  Aorta is faintly palpable. Radial pulses: 2+ palpable and =                           VASCULAR EXAM: Extremities without ischemic changes, without Gangrene; without open  wounds.  LE Pulses Right Left       FEMORAL  2+ palpable  2+ palpable       POPLITEAL  not palpable  not palpable       POSTERIOR TIBIAL  not palpable  not palpable       DORSALIS PEDIS      ANTERIOR TIBIAL not palpable faintly palpable   Abdomen: soft, NT, no palpable masses. Skin: no rashes, no ulcers. Musculoskeletal: no muscle wasting or atrophy.         Neurologic: A&O X 3; Appropriate Affect; MOTOR FUNCTION:  moving all extremities equally, motor strength 5/5 throughout. Speech is fluent/normal. CN 2-12 intact.    Outside Studies/Documentation: 12/28/13 MR brain: IMPRESSION: No acute brain finding. 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 Mild chronic small-vessel change of the white matter otherwise.  No intracranial occlusion or correctable proximal stenosis. Question of 3 mm aneurysm is a at the left posterior communicating artery origin and on the right at the anterior communicating artery region. The vessels are quite tortuous in those areas but I suspect that there are small aneurysms. In a person of this age, these would usually not be treated but only followed.    ASSESSMENT: Leroy Lariscy is a 75 y.o. male who is s/p left femoropopliteal stent placed 12/18/08 and right LE stent placed 02/2009. Since these stents were placed he has had no further claudication. He has no signs of ischemia in his lower extremities. He walks a great deal. Pt does not have DM and he quit smoking in January of 2016; however, he is exposed to secondhand smoke in his home from his wife's smoking.  I advised pt to notify his PCP's office early tomorrow morning re his intermittent left side chest tightness with dyspnea.   DATA ABI's remain  in the normal range with monophasic waveforms on the right, mono and biphasic waveforms on the left. Right TBI is normal at 0.76, left TBI has a dampened waveform.    PLAN:  Based on the patient's vascular studies and examination, pt will return to clinic in 1 year with ABI's.  I discussed in depth with the patient the nature of atherosclerosis, and emphasized the importance of maximal medical management including strict control of blood pressure, blood glucose, and lipid levels, obtaining regular exercise, and continued cessation of smoking.  The patient is aware that without maximal medical management the underlying atherosclerotic disease process will progress, limiting the benefit of any interventions.  The patient was given information about PAD including signs, symptoms, treatment, what symptoms should prompt the patient to seek immediate medical care, and risk reduction measures to take.  Clemon Chambers, RN, MSN, FNP-C [facility] Phone: [phone]  Clinic MD: Trula Slade  01/12/16 4:02 PM

## 2016-03-03 ENCOUNTER — Other Ambulatory Visit: Payer: Self-pay | Admitting: Internal Medicine

## 2016-03-09 NOTE — Addendum Note (Signed)
Addended by: Lianne Cure A on: 03/09/2016 10:51 AM   Modules accepted: Orders

## 2016-03-15 ENCOUNTER — Other Ambulatory Visit: Payer: Self-pay | Admitting: Internal Medicine

## 2016-03-15 DIAGNOSIS — M549 Dorsalgia, unspecified: Principal | ICD-10-CM

## 2016-03-15 DIAGNOSIS — G8929 Other chronic pain: Secondary | ICD-10-CM

## 2016-03-18 ENCOUNTER — Encounter: Payer: Self-pay | Admitting: Neurology

## 2016-03-18 ENCOUNTER — Ambulatory Visit (INDEPENDENT_AMBULATORY_CARE_PROVIDER_SITE_OTHER): Payer: Commercial Managed Care - HMO | Admitting: Neurology

## 2016-03-18 VITALS — BP 124/78 | HR 73 | Ht 68.0 in | Wt 168.1 lb

## 2016-03-18 DIAGNOSIS — R2681 Unsteadiness on feet: Secondary | ICD-10-CM

## 2016-03-18 DIAGNOSIS — G3184 Mild cognitive impairment, so stated: Secondary | ICD-10-CM

## 2016-03-18 NOTE — Progress Notes (Signed)
NEUROLOGY FOLLOW UP OFFICE NOTE  Cap Wernicke [id number]  HISTORY OF PRESENT ILLNESS: I had the pleasure of seeing Henry Carter in follow-up in the neurology clinic on 03/18/2016. He was last seen 3 months ago. 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 He returned more than a year later due to complaint of lightheadedness, with repeat MRI brain without contrast ordered by his PCP done 11/06/15 showing the diffuse ventricular enlargement shows mild progression. Dilated aqueduct shows progression. Mild hyperintensity in the periventricular white matter has progressed and may represent transependymal absorption of CSF related to NPH. There were no acute changes, there was mild chronic microvascular disease. He had been reporting slowed walking and urinary urgency. I had discussed doing a large volume tap with him, which he declined unless symptoms worsened, we agreed to close clinical monitoring, which he returns for today. In the interim, he reports that the urinary issues have resolved now that he takes medication for his prostate. He occasionally staggers, which he reported on last visit as well. He was walking in the backyard a few months and fell forward, no injuries. He reports his memory is getting worse, he forgets a lot of stuff. He occasionally forgets his morning pills. He denies getting lost driving. His wife is in charge of bill payments.   HPI 12/20/2013:  This is a pleasant 75 yo RH man with a history of hypertension, hyperlipidemia, peripheral vascular disease s/p bilateral stent in LE, BPH, who presented for gait instability that he describes as "staggering." He had initially reported these symptoms to his previous PCP [name] in March 2015, where he reported staggering worse in the morning, balance being off, and several months of tinnitus without hearing loss. I reviewed MRI brain without contrast done at that time which did not  show any acute changes, however note of ventricular prominence slightly out of proportion to sulcal prominence, mild hydrocephalus could not be completely excluded. Aqueduct noted to be patent. He and his wife report that at that time, staggering was not as bad, since August 2015, his wife has noticed a significant difference. He would walk down the hallway and go against the wall, close to falling but always catching himself. His wife describes him as appearing like he is "under the influence of something." His speech has been affected as well, where it would become slow. He describes dizziness as lightheadedness, he feels like he is floating and would rather sit in his recliner, although still feeling the same sensation even if sitting down. His wife reports that he is very active, but over the past 3 months has been more sleepy, he fades out and just goes to sleep. He has been having new onset headaches with a pressure sensation over his forehead lasting 15 minutes or so, with no associated nausea, vomiting, photo or phonophobia. He does not take any medication for this. He continues to have the constant tinnitus. He has occasional hand tremors that do not affect his daily activities except his writing is more sloppy.   He feels his memory has been worsening, he forgets conversations easily. He has been forgetting to take his medications "just about everyday" for the past 3 years. He reports an episode 6 months ago when he woke up with his mouth twisted, tongue numb, with slurred speech. He drove to the store then went to his nephew's house and drove the wrong way. He has gotten turned around driving a couple of  times, 2 months ago he forgot how to get somewhere he goes to frequently. One time his wife had to drive down the road looking for him. His wife has always been in charge of bills. No family history of memory problems.   He denies any anosmia, constipation, REM behavior disorder but has sleep jerks  that wake him up. He denies any diplopia, blurred vision, dysphagia, bowel/bladder incontinence, some urinary urgency with the BPH. He has occasional neck and back stiffness. There is no family history of similar symptoms. He has been taking gabapentin for cramps with good effect.   PAST MEDICAL HISTORY: Past Medical History:  Diagnosis Date  .  iron deficiency 01/18/2013  . Anemia   . Angioedema   . BPH (benign prostatic hyperplasia)   . COPD (chronic obstructive pulmonary disease) ([place])    STates he has copd  . Essential hypertension   . GERD (gastroesophageal reflux disease)   . Helicobacter pylori gastritis 10/10/2012  . Hyperlipidemia   . IBS (irritable bowel syndrome)   . Impotence   . Peripheral vascular disease, unspecified   . Personal history of colonic adenoma 09/22/2012  . Personal history of tobacco use, presenting hazards to health     MEDICATIONS: Current Outpatient Prescriptions on File Prior to Visit  Medication Sig Dispense Refill  . albuterol (PROVENTIL HFA;VENTOLIN HFA) 108 (90 BASE) MCG/ACT inhaler Inhale 2 puffs into the lungs every 6 (six) hours as needed for wheezing. 1 Inhaler 3  . aspirin 81 MG tablet Take 81 mg by mouth daily.      . clopidogrel (PLAVIX) 75 MG tablet TAKE 1 TABLET(75 MG) BY MOUTH DAILY 90 tablet 0  . doxazosin (CARDURA) 4 MG tablet TAKE 1 TABLET BY MOUTH TWICE DAILY 180 tablet 0  . ferrous sulfate 325 (65 FE) MG tablet Take 325 mg by mouth daily with breakfast.    . gabapentin (NEURONTIN) 300 MG capsule TAKE 1 CAPSULE BY MOUTH THREE TIMES DAILY 270 capsule 0  . sulfamethoxazole-trimethoprim (BACTRIM DS,SEPTRA DS) 800-160 MG tablet Take 1 tablet by mouth 2 (two) times daily. 14 tablet 0  . tamsulosin (FLOMAX) 0.4 MG CAPS capsule Take 1 capsule (0.4 mg total) by mouth daily. 90 capsule 0  . traMADol (ULTRAM) 50 MG tablet Take 1 tablet (50 mg total) by mouth every 6 (six) hours as needed. 10 tablet 0  . triamcinolone cream (KENALOG) 0.1 % Apply 1  application topically 2 (two) times daily. 30 g 6  . varenicline (CHANTIX) 0.5 MG tablet Take 1 tablet (0.5 mg total) by mouth 2 (two) times daily. 60 tablet 0  . vitamin C (ASCORBIC ACID) 500 MG tablet Take 500 mg by mouth daily.     No current facility-administered medications on file prior to visit.     ALLERGIES: Allergies  Allergen Reactions  . Ace Inhibitors     REACTION: antioedema  . Penicillins Hives    Has patient had a PCN reaction causing immediate rash, facial/tongue/throat swelling, SOB or lightheadedness with hypotension: No Has patient had a PCN reaction causing severe rash involving mucus membranes or skin necrosis: No Has patient had a PCN reaction that required hospitalization yes Has patient had a PCN reaction occurring within the last 10 years: no If all of the above answers are "NO", then may proceed with Cephalosporin use.    FAMILY HISTORY: Family History  Problem Relation Age of Onset  . Alcohol abuse Father   . Kidney disease Brother   . Drug abuse Brother   .  Diabetes Brother   . Diabetes Mother   . Diabetes Brother   . Colon cancer Neg Hx     SOCIAL HISTORY: Social History   Social History  . Marital status: Married    Spouse name: N/A  . Number of children: N/A  . Years of education: N/A   Occupational History  . Not on file.   Social History Main Topics  . Smoking status: Former Smoker    Packs/day: 0.25    Years: 50.00    Types: Cigarettes    Start date: 04/12/1957    Quit date: 04/12/2014  . Smokeless tobacco: Never Used     Comment: using chatix; stopped now; took x 1 week / had stopped 3 years prior to starting back in 2005  . Alcohol use No     Comment: OCC  . Drug use:      Comment: States marijuanna relaxes him;   . Sexual activity: Not on file     Comment: marijuanna   Other Topics Concern  . 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)   Alcohol  use- occasionally   No illict drugs    REVIEW OF SYSTEMS: Constitutional: No fevers, chills, or sweats, no generalized fatigue, change in appetite Eyes: No visual changes, double vision, eye pain Ear, nose and throat: No hearing loss, ear pain, nasal congestion, sore throat Cardiovascular: No chest pain, palpitations Respiratory:  No shortness of breath at rest or with exertion, wheezes GastrointestinaI: No nausea, vomiting, diarrhea, abdominal pain, fecal incontinence Genitourinary:  No dysuria, urinary retention, +frequency Musculoskeletal:  + occl neck pain, back pain Integumentary: No rash, pruritus, skin lesions Neurological: as above Psychiatric: No depression, insomnia, anxiety Endocrine: No palpitations, fatigue, diaphoresis, mood swings, change in appetite, change in weight, increased thirst Hematologic/Lymphatic:  No anemia, purpura, petechiae. Allergic/Immunologic: no itchy/runny eyes, nasal congestion, recent allergic reactions, rashes  PHYSICAL EXAM: Vitals:   03/18/16 1139  BP: 124/78  Pulse: 73   General: No acute distress Head:  Normocephalic/atraumatic Neck: supple, no paraspinal tenderness, full range of motion Heart:  Regular rate and rhythm Lungs:  Clear to auscultation bilaterally Back: No paraspinal tenderness Skin/Extremities: No rash, no edema Neurological Exam: alert and oriented to person, place, and time. No aphasia or dysarthria. Fund of knowledge is appropriate.  Remote memory intact.  Attention and concentration are normal.    Able to name objects and repeat phrases. 1/3 delayed recall. He could not spell WORLD backwards and initially did well with serial 7s, but could not complete task. Cranial nerves: Pupils equal, round, reactive to light. Extraocular movements intact with no nystagmus. Visual fields full. Facial sensation intact. No facial asymmetry. Tongue, uvula, palate midline.  Motor: Bulk and tone normal, no cogwheeling, muscle strength 5/5  throughout with no pronator drift. Sensation: decreased vibration up to ankles bilaterally, intact to light touch (similar to prior). No extinction to double simultaneous stimulation. Romberg test negative Deep Tendon Reflexes: +1 both UE, brisk +3 right patella, +2 left patella and both ankles (similar to prior). no ankle clonus. Plantar responses: downgoing bilaterally. Cerebellar: no incoordination on finger to nose testing. Gait: able to stand with arms crossed over chest, slightly wide-based, with decreased left foot clearance (similar to prior), no magnetic gait seen. good arm swing. (No change from previous testing) Tremor: none   IMPRESSION: This is a pleasant 75 yo RH man with a history of hypertension,  hyperdlipidemia, BPH, peripheral vascular disease, who presented 2 years ago with worsening balance problems with note of episodes of change in speech. He presented last August 2017 for slight progression of ventricular enlargement on MRI concerning for NPH. He does endorse memory issues and staggering gait, reports that urinary symptoms have improved with prostate medication. We again discussed doing a large volume tap for concern for NPH, his gait today is again slightly wide-based but not the classic magnetic gait. He again declines doing an LP, we discussed that if gait symptoms worsen, would recommend proceeding with testing. Refer to PT for gait instability. Continue close clinical monitoring, he will follow-up in 6 months and knows to call for any changes.   Thank you for allowing me to participate in his care.  Please do not hesitate to call for any questions or concerns.  The duration of this appointment visit was 25 minutes of face-to-face time with the patient.  Greater than 50% of this time was spent in counseling, explanation of diagnosis, planning of further management, and coordination of care.   Ellouise Newer, M.D.   CC: Dr. [name]

## 2016-03-18 NOTE — Patient Instructions (Signed)
1. Refer to Physical therapy for gait instability 2. Continue to monitor walking, if it worsens, call our office 3. Follow-up in 6 months

## 2016-03-22 ENCOUNTER — Encounter: Payer: Self-pay | Admitting: Neurology

## 2016-06-03 ENCOUNTER — Encounter: Payer: Self-pay | Admitting: Internal Medicine

## 2016-06-10 ENCOUNTER — Ambulatory Visit: Payer: Commercial Managed Care - HMO | Admitting: Internal Medicine

## 2016-06-11 ENCOUNTER — Ambulatory Visit (INDEPENDENT_AMBULATORY_CARE_PROVIDER_SITE_OTHER): Payer: Medicare HMO | Admitting: Internal Medicine

## 2016-06-11 ENCOUNTER — Encounter: Payer: Self-pay | Admitting: Internal Medicine

## 2016-06-11 DIAGNOSIS — N401 Enlarged prostate with lower urinary tract symptoms: Secondary | ICD-10-CM

## 2016-06-11 DIAGNOSIS — I10 Essential (primary) hypertension: Secondary | ICD-10-CM | POA: Diagnosis not present

## 2016-06-11 DIAGNOSIS — R351 Nocturia: Secondary | ICD-10-CM

## 2016-06-11 NOTE — Patient Instructions (Signed)
We do not need to make any changes.

## 2016-06-11 NOTE — Assessment & Plan Note (Signed)
Taking cardura and flomax which is keeping BP at goal. Continue to refill and not complicated at this time.

## 2016-06-11 NOTE — Progress Notes (Signed)
Pre visit review using our clinic review tool, if applicable. No additional management support is needed unless otherwise documented below in the visit note. 

## 2016-06-11 NOTE — Progress Notes (Signed)
   Subjective:    Patient ID: Henry Carter, male    DOB: 07/26/1940, 76 y.o.   MRN: [id number]  HPI The patient is a 76 YO man coming in for follow up of his blood pressure. He is still taking his medications. He denies new side effects. Had some mild cold symptoms some time ago but is not having that now. He did not take any otc medications for the cold symptoms due to worrying about his blood pressure. Denies headaches or chest pains or SOB. He is also wanting follow up of his BPH. Sometimes having some nocturnal symptoms still but overall better on the medicine. He is taking the cardura and flomax. Getting up 1-2 times at night average but most nights once. He denies new urgency or hesitancy.   Review of Systems  Constitutional: Negative.   Respiratory: Negative.   Cardiovascular: Negative.   Gastrointestinal: Negative.   Genitourinary: Positive for enuresis.  Musculoskeletal: Negative.   Neurological: Negative.       Objective:   Physical Exam  Constitutional: He is oriented to person, place, and time. He appears well-developed and well-nourished.  HENT:  Head: Normocephalic and atraumatic.  Eyes: EOM are normal.  Neck: Normal range of motion. No JVD present.  Cardiovascular: Normal rate and regular rhythm.   Pulmonary/Chest: Effort normal and breath sounds normal.  Abdominal: Soft.  Musculoskeletal: He exhibits no edema.  Lymphadenopathy:    He has no cervical adenopathy.  Neurological: He is alert and oriented to person, place, and time. Coordination normal.  Skin: Skin is warm and dry.   Vitals:   06/11/16 1023  BP: 124/70  Pulse: 80  Temp: 97.9 F (36.6 C)  TempSrc: Oral  SpO2: 99%  Weight: 162 lb (73.5 kg)  Height: 5\' 8"  (1.727 m)      Assessment & Plan:

## 2016-06-11 NOTE — Assessment & Plan Note (Signed)
Taking flomax and cardura and symptoms are acceptable to him. Still some nocturia but this is tolerable and better than prior to starting the medications.

## 2016-07-29 ENCOUNTER — Encounter: Payer: Self-pay | Admitting: Internal Medicine

## 2016-07-29 ENCOUNTER — Ambulatory Visit (INDEPENDENT_AMBULATORY_CARE_PROVIDER_SITE_OTHER): Payer: Medicare HMO | Admitting: Internal Medicine

## 2016-07-29 DIAGNOSIS — G3184 Mild cognitive impairment, so stated: Secondary | ICD-10-CM | POA: Diagnosis not present

## 2016-07-29 DIAGNOSIS — Z9114 Patient's other noncompliance with medication regimen: Secondary | ICD-10-CM | POA: Diagnosis not present

## 2016-07-29 DIAGNOSIS — I70213 Atherosclerosis of native arteries of extremities with intermittent claudication, bilateral legs: Secondary | ICD-10-CM

## 2016-07-29 DIAGNOSIS — R42 Dizziness and giddiness: Secondary | ICD-10-CM | POA: Diagnosis not present

## 2016-07-29 MED ORDER — DONEPEZIL HCL 5 MG PO TABS: 5.0000 mg | ORAL_TABLET | Freq: Every day | ORAL | 6 refills | Status: DC

## 2016-07-29 MED ORDER — MECLIZINE HCL 25 MG PO TABS: 25.0000 mg | ORAL_TABLET | Freq: Three times a day (TID) | ORAL | 0 refills | Status: DC | PRN

## 2016-07-29 NOTE — Progress Notes (Signed)
   Subjective:    Patient ID: Henry Carter, male    DOB: June 12, 1940, 76 y.o.   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   Review of Systems  Constitutional: Positive for activity change, appetite change and fatigue. Negative for chills, fever and unexpected weight change.  HENT: Negative.   Eyes: Negative.   Respiratory: Negative for cough, chest tightness, shortness of breath and wheezing.   Cardiovascular: Negative for chest pain, palpitations and leg swelling.  Gastrointestinal: Negative for abdominal distention, abdominal pain, constipation, diarrhea and nausea.  Musculoskeletal: Positive for gait problem. Negative for arthralgias, myalgias and neck pain.  Skin: Negative.   Neurological: Positive for dizziness and headaches. Negative for seizures, weakness and numbness.  Psychiatric/Behavioral: Negative.       Objective:   Physical Exam  Constitutional: He is oriented to person, place, and time. He appears well-developed and well-nourished.  HENT:  Head: Normocephalic and atraumatic.  Eyes: EOM are  normal.  Neck: Normal range of motion.  Cardiovascular: Normal rate and regular rhythm.   Pulmonary/Chest: Effort normal and breath sounds normal. No respiratory distress. He has no wheezes. He has no rales.  Abdominal: Soft. He exhibits no distension. There is no tenderness. There is no rebound.  Musculoskeletal: He exhibits no edema.  Neurological: He is alert and oriented to person, place, and time. Coordination abnormal.  Some hesitating gait  Skin: Skin is warm and dry.  Psychiatric:  Some distraction during the exam   Vitals:   07/29/16 1003  BP: 120/70  Pulse: (!) 50  Resp: 14  Temp: 97.5 F (36.4 C)  TempSrc: Oral  SpO2: 98%  Weight: 160 lb (72.6 kg)  Height: 5\' 8"  (1.727 m)      Assessment & Plan:

## 2016-07-29 NOTE — Progress Notes (Signed)
Pre visit review using our clinic review tool, if applicable. No additional management support is needed unless otherwise documented below in the visit note. 

## 2016-07-29 NOTE — Patient Instructions (Addendum)
We have sent in the meclizine to try for the dizziness. Use it when needed. The other option to try for the dizziness is that spinal tap that the neurologist has talked to you about.   We have also sent in the medicine for memory called donepezil that you take once daily to help stabilize the memory.   It is okay to take the medicine as you have been doing. Make sure to take the plavix everyday as it is really important.

## 2016-07-30 DIAGNOSIS — Z9114 Patient's other noncompliance with medication regimen: Secondary | ICD-10-CM | POA: Insufficient documentation

## 2016-07-30 NOTE — Assessment & Plan Note (Signed)
Will initiate aricept 5 mg daily, talked with them about strategies to help with memory as well including making lists and setting alarms for cooking.

## 2016-07-30 NOTE — Assessment & Plan Note (Signed)
Rx for meclizine to try. Encouraged him to try doing the large volume spinal tap to see if this helps as he does have some of the symptoms of this and it may be helpful.

## 2016-07-30 NOTE — Assessment & Plan Note (Signed)
He is taking his prostate medication and gabapentin. Okay with this for now. We have given him the aricept for memory and have suggested pill boxes to help him stay organized and keep track of what he is taking when. His wife has concerns about what he is taking and how regularly. She will help monitor.

## 2016-07-30 NOTE — Assessment & Plan Note (Signed)
Not taking plavix but convinced him to take daily 81 mg aspirin as alternative which he agrees to.

## 2016-08-16 ENCOUNTER — Telehealth: Payer: Self-pay

## 2016-08-16 NOTE — Telephone Encounter (Signed)
Submitted PA for Meclizine HCL and got a message that the medication does not require PA, patient should be able to get medication

## 2016-08-17 ENCOUNTER — Other Ambulatory Visit: Payer: Self-pay | Admitting: Internal Medicine

## 2016-09-16 ENCOUNTER — Ambulatory Visit (INDEPENDENT_AMBULATORY_CARE_PROVIDER_SITE_OTHER): Payer: Medicare HMO | Admitting: Neurology

## 2016-09-16 ENCOUNTER — Encounter: Payer: Self-pay | Admitting: Neurology

## 2016-09-16 VITALS — BP 142/70 | HR 69 | Ht 69.0 in | Wt 155.0 lb

## 2016-09-16 DIAGNOSIS — R2681 Unsteadiness on feet: Secondary | ICD-10-CM | POA: Diagnosis not present

## 2016-09-16 DIAGNOSIS — R42 Dizziness and giddiness: Secondary | ICD-10-CM

## 2016-09-16 DIAGNOSIS — G3184 Mild cognitive impairment, so stated: Secondary | ICD-10-CM | POA: Diagnosis not present

## 2016-09-16 MED ORDER — DONEPEZIL HCL 10 MG PO TABS: ORAL_TABLET | ORAL | 3 refills | Status: DC

## 2016-09-16 NOTE — Patient Instructions (Addendum)
1. Increase Aricept to 10mg  daily 2. Schedule EMG/NCV of both LE with Dr. Posey Pronto 3. Refer to PT for balance therapy 4. Continue to monitor driving 5. Follow-up in 6 months, call for any changes

## 2016-09-16 NOTE — Progress Notes (Signed)
NEUROLOGY FOLLOW UP OFFICE NOTE  Henry Carter [id number]  HISTORY OF PRESENT ILLNESS: I had the pleasure of seeing Henry Carter in follow-up in the neurology clinic on 09/16/2016. He was last seen 6 months ago. 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 He returned more than a year later due to complaint of lightheadedness, with repeat MRI brain without contrast ordered by his PCP done 11/06/15 showing the diffuse ventricular enlargement shows mild progression. Dilated aqueduct shows progression. Mild hyperintensity in the periventricular white matter has progressed and may represent transependymal absorption of CSF related to NPH. There were no acute changes, there was mild chronic microvascular disease. I had discussed doing a large volume tap with him, which he declined unless symptoms worsened. He presents today for follow-up reporting his memory is "gone." He continues to drive and denies getting lost driving. He occasional forgets his morning medications. His wife is in charge of bill payments. His main complaint today is dizziness, described as a floating sensation even when lying down. He has near falls, but has not had any major falls. He denies any diplopia, dysarthria/dysphagia, bowel/bladder dysfunction. He has occasional neck and back pain.  HPI 12/20/2013:  This is a pleasant 76 yo RH man with a history of hypertension, hyperlipidemia, peripheral vascular disease s/p bilateral stent in LE, BPH, who presented for gait instability that he describes as "staggering." He had initially reported these symptoms to his previous PCP [name] in March 2015, where he reported staggering worse in the morning, balance being off, and several months of tinnitus without hearing loss. I reviewed MRI brain without contrast done at that time which did not show any acute changes, however note of ventricular prominence slightly out of proportion to sulcal  prominence, mild hydrocephalus could not be completely excluded. Aqueduct noted to be patent. He and his wife report that at that time, staggering was not as bad, since August 2015, his wife has noticed a significant difference. He would walk down the hallway and go against the wall, close to falling but always catching himself. His wife describes him as appearing like he is "under the influence of something." His speech has been affected as well, where it would become slow. He describes dizziness as lightheadedness, he feels like he is floating and would rather sit in his recliner, although still feeling the same sensation even if sitting down. His wife reports that he is very active, but over the past 3 months has been more sleepy, he fades out and just goes to sleep. He has been having new onset headaches with a pressure sensation over his forehead lasting 15 minutes or so, with no associated nausea, vomiting, photo or phonophobia. He does not take any medication for this. He continues to have the constant tinnitus. He has occasional hand tremors that do not affect his daily activities except his writing is more sloppy.   He feels his memory has been worsening, he forgets conversations easily. He has been forgetting to take his medications "just about everyday" for the past 3 years. He reports an episode 6 months ago when he woke up with his mouth twisted, tongue numb, with slurred speech. He drove to the store then went to his nephew's house and drove the wrong way. He has gotten turned around driving a couple of times, 2 months ago he forgot how to get somewhere he goes to frequently. One time his wife had to drive down the road  looking for him. His wife has always been in charge of bills. No family history of memory problems.   He denies any anosmia, constipation, REM behavior disorder but has sleep jerks that wake him up. He denies any diplopia, blurred vision, dysphagia, bowel/bladder incontinence, some  urinary urgency with the BPH. He has occasional neck and back stiffness. There is no family history of similar symptoms. He has been taking gabapentin for cramps with good effect.   PAST MEDICAL HISTORY: Past Medical History:  Diagnosis Date  .  iron deficiency 01/18/2013  . Anemia   . Angioedema   . BPH (benign prostatic hyperplasia)   . COPD (chronic obstructive pulmonary disease) ([place])    STates he has copd  . Essential hypertension   . GERD (gastroesophageal reflux disease)   . Helicobacter pylori gastritis 10/10/2012  . Hyperlipidemia   . IBS (irritable bowel syndrome)   . Impotence   . Peripheral vascular disease, unspecified ([place])   . Personal history of colonic adenoma 09/22/2012  . Personal history of tobacco use, presenting hazards to health     MEDICATIONS: Current Outpatient Prescriptions on File Prior to Visit  Medication Sig Dispense Refill  . albuterol (PROVENTIL HFA;VENTOLIN HFA) 108 (90 BASE) MCG/ACT inhaler Inhale 2 puffs into the lungs every 6 (six) hours as needed for wheezing. 1 Inhaler 3  . aspirin 81 MG tablet Take 81 mg by mouth daily.      . clopidogrel (PLAVIX) 75 MG tablet TAKE 1 TABLET(75 MG) BY MOUTH DAILY 90 tablet 0  . donepezil (ARICEPT) 5 MG tablet Take 1 tablet (5 mg total) by mouth at bedtime. 30 tablet 6  . doxazosin (CARDURA) 4 MG tablet TAKE 1 TABLET BY MOUTH TWICE DAILY 180 tablet 0  . ferrous sulfate 325 (65 FE) MG tablet Take 325 mg by mouth daily with breakfast.    . gabapentin (NEURONTIN) 300 MG capsule TAKE 1 CAPSULE BY MOUTH THREE TIMES DAILY 270 capsule 0  . meclizine (ANTIVERT) 25 MG tablet Take 1 tablet (25 mg total) by mouth 3 (three) times daily as needed for dizziness. 30 tablet 0  . tamsulosin (FLOMAX) 0.4 MG CAPS capsule Take 1 capsule (0.4 mg total) by mouth daily. 90 capsule 0  . traMADol (ULTRAM) 50 MG tablet Take 1 tablet (50 mg total) by mouth every 6 (six) hours as needed. 10 tablet 0  . triamcinolone cream (KENALOG) 0.1 %  Apply 1 application topically 2 (two) times daily. 30 g 6  . varenicline (CHANTIX) 0.5 MG tablet Take 1 tablet (0.5 mg total) by mouth 2 (two) times daily. 60 tablet 0  . vitamin C (ASCORBIC ACID) 500 MG tablet Take 500 mg by mouth daily.     No current facility-administered medications on file prior to visit.     ALLERGIES: Allergies  Allergen Reactions  . Ace Inhibitors     REACTION: antioedema  . Penicillins Hives    Has patient had a PCN reaction causing immediate rash, facial/tongue/throat swelling, SOB or lightheadedness with hypotension: No Has patient had a PCN reaction causing severe rash involving mucus membranes or skin necrosis: No Has patient had a PCN reaction that required hospitalization yes Has patient had a PCN reaction occurring within the last 10 years: no If all of the above answers are "NO", then may proceed with Cephalosporin use.    FAMILY HISTORY: Family History  Problem Relation Age of Onset  . Alcohol abuse Father   . Kidney disease Brother   . Drug  abuse Brother   . Diabetes Brother   . Diabetes Mother   . Diabetes Brother   . Colon cancer Neg Hx     SOCIAL HISTORY: Social History   Social History  . Marital status: Married    Spouse name: N/A  . Number of children: N/A  . Years of education: N/A   Occupational History  . Not on file.   Social History Main Topics  . Smoking status: Former Smoker    Packs/day: 0.25    Years: 50.00    Types: Cigarettes    Start date: 04/12/1957    Quit date: 04/12/2014  . Smokeless tobacco: Never Used     Comment: using chatix; stopped now; took x 1 week / had stopped 3 years prior to starting back in 2005  . Alcohol use No     Comment: OCC  . Drug use: Yes     Comment: States marijuanna relaxes him;   . Sexual activity: Not on file     Comment: marijuanna   Other Topics Concern  . 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)    Alcohol use- occasionally   No illict drugs    REVIEW OF SYSTEMS: Constitutional: No fevers, chills, or sweats, no generalized fatigue, change in appetite Eyes: No visual changes, double vision, eye pain Ear, nose and throat: No hearing loss, ear pain, nasal congestion, sore throat Cardiovascular: No chest pain, palpitations Respiratory:  No shortness of breath at rest or with exertion, wheezes GastrointestinaI: No nausea, vomiting, diarrhea, abdominal pain, fecal incontinence Genitourinary:  No dysuria, urinary retention, +frequency Musculoskeletal:  + occl neck pain, back pain Integumentary: No rash, pruritus, skin lesions Neurological: as above Psychiatric: No depression, insomnia, anxiety Endocrine: No palpitations, fatigue, diaphoresis, mood swings, change in appetite, change in weight, increased thirst Hematologic/Lymphatic:  No anemia, purpura, petechiae. Allergic/Immunologic: no itchy/runny eyes, nasal congestion, recent allergic reactions, rashes  PHYSICAL EXAM: Vitals:   09/16/16 1106  BP: (!) 142/70  Pulse: 69   General: No acute distress Head:  Normocephalic/atraumatic Neck: supple, no paraspinal tenderness, full range of motion Heart:  Regular rate and rhythm Lungs:  Clear to auscultation bilaterally Back: No paraspinal tenderness Skin/Extremities: No rash, no edema Neurological Exam: alert and oriented to person, place, and month/year/season. No aphasia or dysarthria. Fund of knowledge is appropriate.  Remote memory intact.  Attention and concentration are normal.    Able to name objects and repeat phrases. 2/3 delayed recall. He could not spell WORLD backwards, able to do 2 serial 7s but could not complete task. CDT 5/5  MMSE - Mini Mental State Exam 09/16/2016 12/11/2015 10/16/2015  Not completed: - - (No Data)  Orientation to time 3 4 -  Orientation to Place 5 5 -  Registration 3 3 -  Attention/ Calculation 2 2 -  Recall 2 2 -  Recall-comments - - -  Language-  name 2 objects 2 2 -  Language- repeat 1 1 -  Language- follow 3 step command 3 3 -  Language- read & follow direction 1 1 -  Write a sentence 1 1 -  Copy design 1 1 -  Total score 24 25 -    Cranial nerves: Pupils equal, round, reactive to light. Extraocular movements intact with no nystagmus. Visual fields full. Facial sensation intact. No facial asymmetry. Tongue, uvula, palate midline.  Motor: Bulk and tone normal, no cogwheeling, muscle  strength 5/5 throughout with no pronator drift. Sensation: decreased pin and vibration up to ankles bilaterally, intact to light touch (similar to prior). No extinction to double simultaneous stimulation. Romberg test negative Deep Tendon Reflexes: +1 both UE, brisk +3 right patella, +2 left patella and both ankles (similar to prior). no ankle clonus. Plantar responses: downgoing bilaterally. Cerebellar: no incoordination on finger to nose testing. Gait: able to stand with arms crossed over chest, slightly wide-based, with decreased left foot clearance (similar to prior), no magnetic gait seen. good arm swing. (No change from previous testing) Tremor: none   IMPRESSION: This is a pleasant 76 yo RH man with a history of hypertension, hyperdlipidemia, BPH, peripheral vascular disease, who presented a few years ago with worsening balance problems with note of episodes of change in speech. He presented last August 2017 for slight progression of ventricular enlargement on MRI concerning for NPH. He continues to endorse memory issues, has balance problems, but no clear magnetic gait on exam. We again discussed doing a large volume tap for concern for NPH, which he again declines. MMSE today 24/30, increase Aricept to 10mg  daily. His main concern today is the dizziness, which may relate to neuropathy. EMG/NCV of both legs will be ordered to further evaluate his symptoms. He is now agreeable to doing Balance therapy as previously recommended. Continue to monitor driving. He  will follow-up in 6 months and knows to call for any changes.   Thank you for allowing me to participate in his care.  Please do not hesitate to call for any questions or concerns.  The duration of this appointment visit was 25 minutes of face-to-face time with the patient.  Greater than 50% of this time was spent in counseling, explanation of diagnosis, planning of further management, and coordination of care.   Ellouise Newer, M.D.   CC: Dr. [name]

## 2016-09-20 ENCOUNTER — Encounter: Payer: Self-pay | Admitting: Neurology

## 2016-10-07 ENCOUNTER — Encounter: Payer: Medicare HMO | Admitting: Neurology

## 2016-10-08 ENCOUNTER — Encounter: Payer: Self-pay | Admitting: Neurology

## 2016-10-12 ENCOUNTER — Ambulatory Visit: Payer: Medicare HMO | Admitting: Internal Medicine

## 2017-01-10 ENCOUNTER — Other Ambulatory Visit: Payer: Medicare HMO

## 2017-01-10 ENCOUNTER — Encounter: Payer: Self-pay | Admitting: Internal Medicine

## 2017-01-10 ENCOUNTER — Ambulatory Visit (INDEPENDENT_AMBULATORY_CARE_PROVIDER_SITE_OTHER): Payer: Medicare HMO | Admitting: Internal Medicine

## 2017-01-10 VITALS — BP 130/90 | HR 54 | Temp 97.7°F | Ht 69.0 in | Wt 159.0 lb

## 2017-01-10 DIAGNOSIS — R413 Other amnesia: Secondary | ICD-10-CM

## 2017-01-10 DIAGNOSIS — Z23 Encounter for immunization: Secondary | ICD-10-CM

## 2017-01-10 DIAGNOSIS — G3184 Mild cognitive impairment, so stated: Secondary | ICD-10-CM | POA: Diagnosis not present

## 2017-01-10 NOTE — Progress Notes (Signed)
   Subjective:    Patient ID: Henry Carter, male    DOB: 1940/06/07, 76 y.o.   MRN: [id number]  HPI  The patient is a 76 YO man coming in for follow up of memory medicine. His neurologist has increased his aricept to 10 mg daily. He denies depression, anxiety, guilt, sleeping problems. He is taking the aricpet and does not notice a lot of difference. He is having a lot of problems with short term memory and wanders into rooms and forgets what he is doing there. He has some problems with taking his medicines. He has a pill divider but does not use it.  Review of Systems  Constitutional: Negative.   HENT: Negative.   Eyes: Negative.   Respiratory: Negative for cough, chest tightness and shortness of breath.   Cardiovascular: Negative for chest pain, palpitations and leg swelling.  Gastrointestinal: Negative for abdominal distention, abdominal pain, constipation, diarrhea, nausea and vomiting.  Musculoskeletal: Negative.   Skin: Negative.   Neurological: Negative.   Psychiatric/Behavioral: Negative.       Objective:   Physical Exam  Constitutional: He is oriented to person, place, and time. He appears well-developed and well-nourished.  HENT:  Head: Normocephalic and atraumatic.  Eyes: EOM are normal.  Neck: Normal range of motion.  Cardiovascular: Normal rate and regular rhythm.   Pulmonary/Chest: Effort normal and breath sounds normal. No respiratory distress. He has no wheezes. He has no rales.  Abdominal: Soft. Bowel sounds are normal. He exhibits no distension. There is no tenderness. There is no rebound.  Musculoskeletal: He exhibits no edema.  Neurological: He is alert and oriented to person, place, and time. Coordination normal.  Recall 2/3, A and O times 2, cannot do serial 7s, can follow 3 step directions  Skin: Skin is warm and dry.  Psychiatric: He has a normal mood and affect.   Vitals:   01/10/17 1035  BP: 130/90  Pulse: (!) 54  Temp: 97.7 F (36.5 C)  TempSrc:  Oral  SpO2: 99%  Weight: 159 lb (72.1 kg)  Height: 5\' 9"  (1.753 m)      Assessment & Plan:  Flu shot given at visit

## 2017-01-10 NOTE — Patient Instructions (Signed)

## 2017-01-12 NOTE — Assessment & Plan Note (Signed)
Checking labs for other impact to memory. Does not appear to have depression. Has been offered high volume spinal tap which he has declined. Advised to continue aricept and given strategies to help compensate for his memory. We discussed that memory medicines do not make the memory better but preserve function longer.

## 2017-01-24 ENCOUNTER — Ambulatory Visit: Payer: Commercial Managed Care - HMO | Admitting: Family

## 2017-01-24 ENCOUNTER — Encounter (HOSPITAL_COMMUNITY): Payer: Medicare HMO

## 2017-03-18 ENCOUNTER — Ambulatory Visit: Payer: Medicare HMO | Admitting: Neurology

## 2017-05-26 ENCOUNTER — Other Ambulatory Visit: Payer: Self-pay | Admitting: Internal Medicine

## 2017-06-06 ENCOUNTER — Ambulatory Visit: Payer: Medicare HMO | Admitting: Neurology

## 2017-07-13 ENCOUNTER — Ambulatory Visit (INDEPENDENT_AMBULATORY_CARE_PROVIDER_SITE_OTHER): Payer: Medicare HMO | Admitting: *Deleted

## 2017-07-13 VITALS — BP 152/78 | HR 60 | Resp 19 | Ht 69.0 in | Wt 166.0 lb

## 2017-07-13 DIAGNOSIS — Z0000 Encounter for general adult medical examination without abnormal findings: Secondary | ICD-10-CM

## 2017-07-13 NOTE — Patient Instructions (Addendum)
Continue doing brain stimulating activities (puzzles, reading, adult coloring books, staying active) to keep memory sharp.   Continue to eat heart healthy diet (full of fruits, vegetables, whole grains, lean protein, water--limit salt, fat, and sugar intake) and increase physical activity as tolerated.   Henry Carter , Thank you for taking time to come for your Medicare Wellness Visit. I appreciate your ongoing commitment to your health goals. Please review the following plan we discussed and let me know if I can assist you in the future.   These are the goals we discussed: Goals    . Exercise 3x per week (30 min per time)     Wants to get back on the golf;  As much as possible; If right hip and leg is better; will play 3 times per week    . Patient Stated     Eat healthier by monitoring salt, fat, cholesterol, and drink more water. Continue to ride my stationary bike and walk. Enjoy life, family and play golf.        This is a list of the screening recommended for you and due dates:  Health Maintenance  Topic Date Due  . Tetanus Vaccine  07/08/2017  . Flu Shot  11/10/2017  . Pneumonia vaccines  Completed      Fat and Cholesterol Restricted Diet Getting too much fat and cholesterol in your diet may cause health problems. Following this diet helps keep your fat and cholesterol at normal levels. This can keep you from getting sick. What types of fat should I choose?  Choose monosaturated and polyunsaturated fats. These are found in foods such as olive oil, canola oil, flaxseeds, walnuts, almonds, and seeds.  Eat more omega-3 fats. Good choices include salmon, mackerel, sardines, tuna, flaxseed oil, and ground flaxseeds.  Limit saturated fats. These are in animal products such as meats, butter, and cream. They can also be in plant products such as palm oil, palm kernel oil, and coconut oil.  Avoid foods with partially hydrogenated oils in them. These contain trans fats. Examples of  foods that have trans fats are stick margarine, some tub margarines, cookies, crackers, and other baked goods. What general guidelines do I need to follow?  Check food labels. Look for the words "trans fat" and "saturated fat."  When preparing a meal: ? Fill half of your plate with vegetables and green salads. ? Fill one fourth of your plate with whole grains. Look for the word "whole" as the first word in the ingredient list. ? Fill one fourth of your plate with lean protein foods.  Eat more foods that have fiber, like apples, carrots, beans, peas, and barley.  Eat more home-cooked foods. Eat less at restaurants and buffets.  Limit or avoid alcohol.  Limit foods high in starch and sugar.  Limit fried foods.  Cook foods without frying them. Baking, boiling, grilling, and broiling are all great options.  Lose weight if you are overweight. Losing even a small amount of weight can help your overall health. It can also help prevent diseases such as diabetes and heart disease. What foods can I eat? Grains Whole grains, such as whole wheat or whole grain breads, crackers, cereals, and pasta. Unsweetened oatmeal, bulgur, barley, quinoa, or brown rice. Corn or whole wheat flour tortillas. Vegetables Fresh or frozen vegetables (raw, steamed, roasted, or grilled). Green salads. Fruits All fresh, canned (in natural juice), or frozen fruits. Meat and Other Protein Products Ground beef (85% or leaner), grass-fed beef, or  beef trimmed of fat. Skinless chicken or [place]. Ground chicken or [place]. Pork trimmed of fat. All fish and seafood. Eggs. Dried beans, peas, or lentils. Unsalted nuts or seeds. Unsalted canned or dry beans. Dairy Low-fat dairy products, such as skim or 1% milk, 2% or reduced-fat cheeses, low-fat ricotta or cottage cheese, or plain low-fat yogurt. Fats and Oils Tub margarines without trans fats. Light or reduced-fat mayonnaise and salad dressings. Avocado. Olive, canola,  sesame, or safflower oils. Natural peanut or almond butter (choose ones without added sugar and oil). The items listed above may not be a complete list of recommended foods or beverages. Contact your dietitian for more options. What foods are not recommended? Grains White bread. White pasta. White rice. Cornbread. Bagels, pastries, and croissants. Crackers that contain trans fat. Vegetables White potatoes. Corn. Creamed or fried vegetables. Vegetables in a cheese sauce. Fruits Dried fruits. Canned fruit in light or heavy syrup. Fruit juice. Meat and Other Protein Products Fatty cuts of meat. Ribs, chicken wings, bacon, sausage, bologna, salami, chitterlings, fatback, hot dogs, bratwurst, and packaged luncheon meats. Liver and organ meats. Dairy Whole or 2% milk, cream, half-and-half, and cream cheese. Whole milk cheeses. Whole-fat or sweetened yogurt. Full-fat cheeses. Nondairy creamers and whipped toppings. Processed cheese, cheese spreads, or cheese curds. Sweets and Desserts Corn syrup, sugars, honey, and molasses. Candy. Jam and jelly. Syrup. Sweetened cereals. Cookies, pies, cakes, donuts, muffins, and ice cream. Fats and Oils Butter, stick margarine, lard, shortening, ghee, or bacon fat. Coconut, palm kernel, or palm oils. Beverages Alcohol. Sweetened drinks (such as sodas, lemonade, and fruit drinks or punches). The items listed above may not be a complete list of foods and beverages to avoid. Contact your dietitian for more information. This information is not intended to replace advice given to you by your health care provider. Make sure you discuss any questions you have with your health care provider. Document Released: 09/28/2011 Document Revised: 12/04/2015 Document Reviewed: 06/28/2013 Elsevier Interactive Patient Education  2018 Henry Carter stands for "Dietary Approaches to Stop Hypertension." The Carter eating plan is a healthy eating plan that has  been shown to reduce high blood pressure (hypertension). It may also reduce your risk for type 2 diabetes, heart disease, and stroke. The Carter eating plan may also help with weight loss. What are tips for following this plan? General guidelines  Avoid eating more than 2,300 mg (milligrams) of salt (sodium) a day. If you have hypertension, you may need to reduce your sodium intake to 1,500 mg a day.  Limit alcohol intake to no more than 1 drink a day for nonpregnant women and 2 drinks a day for men. One drink equals 12 oz of beer, 5 oz of Henry Carter, or 1 oz of hard liquor.  Work with your health care provider to maintain a healthy body weight or to lose weight. Ask what an ideal weight is for you.  Get at least 30 minutes of exercise that causes your heart to beat faster (aerobic exercise) most days of the week. Activities may include walking, swimming, or biking.  Work with your health care provider or diet and nutrition specialist (dietitian) to adjust your eating plan to your individual calorie needs. Reading food labels  Check food labels for the amount of sodium per serving. Choose foods with less than 5 percent of the Daily Value of sodium. Generally, foods with less than 300 mg of sodium per serving fit into this eating plan.  To  find whole grains, look for the word "whole" as the first word in the ingredient list. Shopping  Buy products labeled as "low-sodium" or "no salt added."  Buy fresh foods. Avoid canned foods and premade or frozen meals. Cooking  Avoid adding salt when cooking. Use salt-free seasonings or herbs instead of table salt or sea salt. Check with your health care provider or pharmacist before using salt substitutes.  Do not fry foods. Cook foods using healthy methods such as baking, boiling, grilling, and broiling instead.  Cook with heart-healthy oils, such as olive, canola, soybean, or sunflower oil. Meal planning   Eat a balanced diet that includes: ? 5 or  more servings of fruits and vegetables each day. At each meal, try to fill half of your plate with fruits and vegetables. ? Up to 6-8 servings of whole grains each day. ? Less than 6 oz of lean meat, poultry, or fish each day. A 3-oz serving of meat is about the same size as a deck of cards. One egg equals 1 oz. ? 2 servings of low-fat dairy each day. ? A serving of nuts, seeds, or beans 5 times each week. ? Heart-healthy fats. Healthy fats called Omega-3 fatty acids are found in foods such as flaxseeds and coldwater fish, like sardines, salmon, and mackerel.  Limit how much you eat of the following: ? Canned or prepackaged foods. ? Food that is high in trans fat, such as fried foods. ? Food that is high in saturated fat, such as fatty meat. ? Sweets, desserts, sugary drinks, and other foods with added sugar. ? Full-fat dairy products.  Do not salt foods before eating.  Try to eat at least 2 vegetarian meals each week.  Eat more home-cooked food and less restaurant, buffet, and fast food.  When eating at a restaurant, ask that your food be prepared with less salt or no salt, if possible. What foods are recommended? The items listed may not be a complete list. Talk with your dietitian about what dietary choices are best for you. Grains Whole-grain or whole-wheat bread. Whole-grain or whole-wheat pasta. Brown rice. [place]. Bulgur. Whole-grain and low-sodium cereals. Pita bread. Low-fat, low-sodium crackers. Whole-wheat flour tortillas. Vegetables Fresh or frozen vegetables (raw, steamed, roasted, or grilled). Low-sodium or reduced-sodium tomato and vegetable juice. Low-sodium or reduced-sodium tomato sauce and tomato paste. Low-sodium or reduced-sodium canned vegetables. Fruits All fresh, dried, or frozen fruit. Canned fruit in natural juice (without added sugar). Meat and other protein foods Skinless chicken or [place]. Ground chicken or [place]. Pork with fat trimmed off. Fish  and seafood. Egg whites. Dried beans, peas, or lentils. Unsalted nuts, nut butters, and seeds. Unsalted canned beans. Lean cuts of beef with fat trimmed off. Low-sodium, lean deli meat. Dairy Low-fat (1%) or fat-free (skim) milk. Fat-free, low-fat, or reduced-fat cheeses. Nonfat, low-sodium ricotta or cottage cheese. Low-fat or nonfat yogurt. Low-fat, low-sodium cheese. Fats and oils Soft margarine without trans fats. Vegetable oil. Low-fat, reduced-fat, or light mayonnaise and salad dressings (reduced-sodium). Canola, safflower, olive, soybean, and sunflower oils. Avocado. Seasoning and other foods Herbs. Spices. Seasoning mixes without salt. Unsalted popcorn and pretzels. Fat-free sweets. What foods are not recommended? The items listed may not be a complete list. Talk with your dietitian about what dietary choices are best for you. Grains Baked goods made with fat, such as croissants, muffins, or some breads. Dry pasta or rice meal packs. Vegetables Creamed or fried vegetables. Vegetables in a cheese sauce. Regular canned vegetables (not  low-sodium or reduced-sodium). Regular canned tomato sauce and paste (not low-sodium or reduced-sodium). Regular tomato and vegetable juice (not low-sodium or reduced-sodium). Angie Fava. Olives. Fruits Canned fruit in a light or heavy syrup. Fried fruit. Fruit in cream or butter sauce. Meat and other protein foods Fatty cuts of meat. Ribs. Fried meat. Berniece Salines. Sausage. Bologna and other processed lunch meats. Salami. Fatback. Hotdogs. Bratwurst. Salted nuts and seeds. Canned beans with added salt. Canned or smoked fish. Whole eggs or egg yolks. Chicken or [place] with skin. Dairy Whole or 2% milk, cream, and half-and-half. Whole or full-fat cream cheese. Whole-fat or sweetened yogurt. Full-fat cheese. Nondairy creamers. Whipped toppings. Processed cheese and cheese spreads. Fats and oils Butter. Stick margarine. Lard. Shortening. Ghee. Bacon fat. Tropical oils,  such as coconut, palm kernel, or palm oil. Seasoning and other foods Salted popcorn and pretzels. Onion salt, garlic salt, seasoned salt, table salt, and sea salt. Worcestershire sauce. Tartar sauce. Barbecue sauce. Teriyaki sauce. Soy sauce, including reduced-sodium. Steak sauce. Canned and packaged gravies. Fish sauce. Oyster sauce. Cocktail sauce. Horseradish that you find on the shelf. Ketchup. Mustard. Meat flavorings and tenderizers. Bouillon cubes. Hot sauce and Tabasco sauce. Premade or packaged marinades. Premade or packaged taco seasonings. Relishes. Regular salad dressings. Where to find more information:  National Heart, Lung, and [place]: https://wilson-eaton.com/  American Heart Association: www.heart.org Summary  The Carter eating plan is a healthy eating plan that has been shown to reduce high blood pressure (hypertension). It may also reduce your risk for type 2 diabetes, heart disease, and stroke.  With the Carter eating plan, you should limit salt (sodium) intake to 2,300 mg a day. If you have hypertension, you may need to reduce your sodium intake to 1,500 mg a day.  When on the Carter eating plan, aim to eat more fresh fruits and vegetables, whole grains, lean proteins, low-fat dairy, and heart-healthy fats.  Work with your health care provider or diet and nutrition specialist (dietitian) to adjust your eating plan to your individual calorie needs. This information is not intended to replace advice given to you by your health care provider. Make sure you discuss any questions you have with your health care provider. Document Released: 03/18/2011 Document Revised: 03/22/2016 Document Reviewed: 03/22/2016 Elsevier Interactive Patient Education  [employer].

## 2017-07-13 NOTE — Progress Notes (Signed)
Medical screening examination/treatment/procedure(s) were performed by non-physician practitioner and as supervising physician I was immediately available for consultation/collaboration. I agree with above. Elizabeth A Crawford, MD 

## 2017-07-13 NOTE — Progress Notes (Signed)
Subjective:   Henry Carter is a 77 y.o. male who presents for Medicare Annual/Subsequent preventive examination. Patient states he feels that he had a "little stroke" 04/05/17 and since then he has had right arm weakness. Review of Systems:  No ROS.  Medicare Wellness Visit. Additional risk factors are reflected in the social history.   Cardiac Risk Factors include: advanced age (>82men, >52 women);dyslipidemia;hypertension;male gender Sleep patterns: feels rested on waking, gets up 1 times nightly to void and sleeps 8 hours nightly.   Home Safety/Smoke Alarms: Feels safe in home. Smoke alarms in place.  Living environment; residence and Firearm Safety: 1-story house/ trailer, no firearms.Lives with wife, no needs for DME, good support system Seat Belt Safety/Bike Helmet: Wears seat belt.   PSA-  Lab Results  Component Value Date   PSA 4.04 (H) 01/05/2012   PSA 2.58 07/18/2009       Objective:    Vitals: BP (!) 152/78   Pulse 60   Resp 19   Ht 5\' 9"  (1.753 m)   Wt 166 lb (75.3 kg)   SpO2 98%   BMI 24.51 kg/m   Body mass index is 24.51 kg/m.  Advanced Directives 07/13/2017 01/12/2016 10/16/2015 10/16/2015 01/13/2015 01/06/2015 07/25/2014  Does Patient Have a Medical Advance Directive? No No No Yes No No No  Copy of Healthcare Power of Attorney in Chart? - - - No - copy requested - - -  Would patient like information on creating a medical advance directive? Yes (ED - Information included in AVS) No - patient declined information - - - Yes - Scientist, clinical (histocompatibility and immunogenetics) given Yes - Scientist, clinical (histocompatibility and immunogenetics) given  Pre-existing out of facility DNR order (yellow form or pink MOST form) - - - - - - -    Tobacco Social History   Tobacco Use  Smoking Status Former Smoker  . Packs/day: 0.25  . Years: 50.00  . Pack years: 12.50  . Types: Cigarettes  . Start date: 04/12/1957  . Last attempt to quit: 04/12/2014  . Years since quitting: 3.2  Smokeless Tobacco Never Used  Tobacco Comment   using chatix; stopped now; took x 1 week / had stopped 3 years prior to starting back in 2005     Counseling given: Not Answered Comment: using chatix; stopped now; took x 1 week / had stopped 3 years prior to starting back in 2005  Past Medical History:  Diagnosis Date  .  iron deficiency 01/18/2013  . Anemia   . Angioedema   . BPH (benign prostatic hyperplasia)   . COPD (chronic obstructive pulmonary disease) ([place])    STates he has copd  . Essential hypertension   . GERD (gastroesophageal reflux disease)   . Helicobacter pylori gastritis 10/10/2012  . Hyperlipidemia   . IBS (irritable bowel syndrome)   . Impotence   . Peripheral vascular disease, unspecified ([place])   . Personal history of colonic adenoma 09/22/2012  . Personal history of tobacco use, presenting hazards to health    Past Surgical History:  Procedure Laterality Date  . abdominal surg for ulcers'  1980  . COLONOSCOPY N/A 09/22/2012   Procedure: COLONOSCOPY;  Surgeon: Gatha Mayer, MD;  Location: [facility];  Service: Endoscopy;  Laterality: N/A;  . distal aortogram    . percutaneous transluminal angionplasty with placement of 2 self expanding stent in the distal left superfical femeral artery     Family History  Problem Relation Age of Onset  . Alcohol abuse Father   .  Kidney disease Brother   . Drug abuse Brother   . Diabetes Brother   . Diabetes Mother   . Diabetes Brother   . Colon cancer Neg Hx    Social History   Socioeconomic History  . Marital status: Married    Spouse name: Not on file  . Number of children: 4  . Years of education: Not on file  . Highest education level: Not on file  Occupational History  . Not on file  Social Needs  . Financial resource strain: Not hard at all  . Food insecurity:    Worry: Never true    Inability: Never true  . Transportation needs:    Medical: No    Non-medical: No  Tobacco Use  . Smoking status: Former Smoker    Packs/day: 0.25    Years: 50.00     Pack years: 12.50    Types: Cigarettes    Start date: 04/12/1957    Last attempt to quit: 04/12/2014    Years since quitting: 3.2  . Smokeless tobacco: Never Used  . Tobacco comment: using chatix; stopped now; took x 1 week / had stopped 3 years prior to starting back in 2005  Substance and Sexual Activity  . Alcohol use: No    Alcohol/week: 0.0 oz    Comment: OCC  . Drug use: Yes    Comment: States marijuanna relaxes him;   . Sexual activity: Not Currently    Comment: marijuanna  Lifestyle  . Physical activity:    Days per week: 4 days    Minutes per session: 40 min  . Stress: Not at all  Relationships  . Social connections:    Talks on phone: More than three times a week    Gets together: More than three times a week    Attends religious service: More than 4 times per year    Active member of club or organization: Yes    Attends meetings of clubs or organizations: More than 4 times per year    Relationship status: Married  Other Topics Concern  . 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 albuterol (PROVENTIL HFA;VENTOLIN HFA) 108 (90 BASE) MCG/ACT inhaler Inhale 2 puffs into the lungs every 6 (six) hours as needed for wheezing.  Marland Kitchen aspirin 81 MG tablet Take 81 mg by mouth daily.    . clopidogrel (PLAVIX) 75 MG tablet TAKE 1 TABLET(75 MG) BY MOUTH DAILY  . donepezil (ARICEPT) 10 MG tablet Take 1 tablet daily  . doxazosin (CARDURA) 4 MG tablet TAKE 1 TABLET BY MOUTH TWICE DAILY  . ferrous sulfate 325 (65 FE) MG tablet Take 325 mg by mouth daily with breakfast.  . gabapentin (NEURONTIN) 300 MG capsule TAKE 1 CAPSULE BY MOUTH THREE TIMES DAILY  . meclizine (ANTIVERT) 25 MG tablet Take 1 tablet (25 mg total) by mouth 3 (three) times daily as needed for dizziness.  . tamsulosin (FLOMAX) 0.4  MG CAPS capsule Take 1 capsule (0.4 mg total) by mouth daily. (Patient taking differently: Take 0.4 mg by mouth 2 times daily at 12 noon and 4 pm. )  . vitamin C (ASCORBIC ACID) 500 MG tablet Take 500 mg by mouth daily.  . [DISCONTINUED] traMADol (ULTRAM) 50 MG tablet Take 1 tablet (50 mg total)  by mouth every 6 (six) hours as needed. (Patient not taking: Reported on 07/13/2017)  . [DISCONTINUED] triamcinolone cream (KENALOG) 0.1 % Apply 1 application topically 2 (two) times daily. (Patient not taking: Reported on 07/13/2017)  . [DISCONTINUED] varenicline (CHANTIX) 0.5 MG tablet Take 1 tablet (0.5 mg total) by mouth 2 (two) times daily. (Patient not taking: Reported on 07/13/2017)   No facility-administered encounter medications on file as of 07/13/2017.     Activities of Daily Living In your present state of health, do you have any difficulty performing the following activities: 07/13/2017  Hearing? N  Vision? N  Difficulty concentrating or making decisions? N  Walking or climbing stairs? N  Dressing or bathing? N  Doing errands, shopping? N  Preparing Food and eating ? N  Using the Toilet? N  In the past six months, have you accidently leaked urine? N  Do you have problems with loss of bowel control? N  Managing your Medications? N  Managing your Finances? N  Housekeeping or managing your Housekeeping? N  Some recent data might be hidden    Patient Care Team: [name], MD as PCP - General (Internal Medicine) Rigoberto Noel, MD (Pulmonary Disease) Early, Arvilla Meres, MD (Vascular Surgery) Burnell Blanks, MD (Cardiology)   Assessment:   This is a routine wellness examination for Ikeem. Physical assessment deferred to PCP.   Exercise Activities and Dietary recommendations Current Exercise Habits: Home exercise routine, Type of exercise: walking(staionary bike), Time (Minutes): 30, Frequency (Times/Week): 4, Weekly Exercise (Minutes/Week): 120, Intensity: Mild, Exercise  limited by: None identified   Diet (meal preparation, eat out, water intake, caffeinated beverages, dairy products, fruits and vegetables): in general, a "healthy" diet  , well balanced   Reviewed heart healthy diet, encouraged patient to increase daily water intake. Diet education was attached to patient's AVS.  Goals    . Exercise 3x per week (30 min per time)     Wants to get back on the golf;  As much as possible; If right hip and leg is better; will play 3 times per week    . Patient Stated     Eat healthier by monitoring salt, fat, cholesterol, and drink more water. Continue to ride my stationary bike and walk. Enjoy life, family and play golf.        Fall Risk Fall Risk  07/13/2017 01/10/2017 09/16/2016 03/18/2016 12/11/2015  Falls in the past year? No No No No No  Comment - - - - -  Number falls in past yr: - - - - -  Injury with Fall? - - - - -  Risk for fall due to : - - - - -  Risk for fall due to: Comment - - - - -  Follow up - - - - -    Depression Screen PHQ 2/9 Scores 07/13/2017 01/10/2017 10/16/2015 10/16/2015  PHQ - 2 Score 0 0 0 0  PHQ- 9 Score 0 - - -    Cognitive Function MMSE - Mini Mental State Exam 07/13/2017 09/16/2016 12/11/2015 10/16/2015 07/25/2014  Not completed: - - - (No Data) -  Orientation to time 0 3 4 - 5  Orientation to Place 5 5 5  - 5  Registration 3 3 3  - 3  Attention/ Calculation 5 2 2  - 1  Recall 2 2 2  - 2  Recall-comments - - - - third with cue  Language- name 2 objects 2 2 2  - 2  Language- repeat 1 1  1 - 1  Language- follow 3 step command 3 3 3  - 3  Language- read & follow direction 1 1 1  - 1  Write a sentence 1 1 1  - 1  Copy design 1 1 1  - 1  Total score 24 24 25  - 25   Montreal Cognitive Assessment  12/23/2013  Visuospatial/ Executive (0/5) 3  Naming (0/3) 3  Attention: Read list of digits (0/2) 2  Attention: Read list of letters (0/1) 1  Attention: Serial 7 subtraction starting at 100 (0/3) 2  Language: Repeat phrase (0/2) 2  Language :  Fluency (0/1) 0  Abstraction (0/2) 2  Delayed Recall (0/5) 3  Orientation (0/6) 6  Total 24  Adjusted Score (based on education) 24      Immunization History  Administered Date(s) Administered  . Influenza Split 01/05/2012, 01/01/2013  . Influenza, High Dose Seasonal PF 01/10/2017  . Influenza,inj,Quad PF,6+ Mos 12/21/2013, 12/20/2014, 12/23/2015  . PPD Test 09/11/2012  . Pneumococcal Conjugate-13 02/06/2013  . Pneumococcal Polysaccharide-23 04/17/2013  . Td 07/09/2007   Screening Tests Health Maintenance  Topic Date Due  . TETANUS/TDAP  07/08/2017  . INFLUENZA VACCINE  11/10/2017  . PNA vac Low Risk Adult  Completed      Plan:    Scheduled an appointment with PCP on 07/19/17 to evaluate patient's concern of feeling as though he had a "little stroke" on 04/05/17 and since then he has had right arm weakness.   Continue doing brain stimulating activities (puzzles, reading, adult coloring books, staying active) to keep memory sharp.   Continue to eat heart healthy diet (full of fruits, vegetables, whole grains, lean protein, water--limit salt, fat, and sugar intake) and increase physical activity as tolerated.   have personally reviewed and noted the following in the patient's chart:   . Medical and social history . Use of alcohol, tobacco or illicit drugs  . Current medications and supplements . Functional ability and status . Nutritional status . Physical activity . Advanced directives . List of other physicians . Vitals . Screenings to include cognitive, depression, and falls . Referrals and appointments  In addition, I have reviewed and discussed with patient certain preventive protocols, quality metrics, and best practice recommendations. A written personalized care plan for preventive services as well as general preventive health recommendations were provided to patient.     Michiel Cowboy, RN  07/13/2017

## 2017-07-19 ENCOUNTER — Other Ambulatory Visit (INDEPENDENT_AMBULATORY_CARE_PROVIDER_SITE_OTHER): Payer: Medicare HMO

## 2017-07-19 ENCOUNTER — Ambulatory Visit: Payer: Medicare HMO | Admitting: Internal Medicine

## 2017-07-19 ENCOUNTER — Encounter: Payer: Self-pay | Admitting: Internal Medicine

## 2017-07-19 VITALS — BP 160/98 | HR 86 | Temp 98.4°F | Ht 69.0 in | Wt 161.0 lb

## 2017-07-19 DIAGNOSIS — R413 Other amnesia: Secondary | ICD-10-CM | POA: Diagnosis not present

## 2017-07-19 DIAGNOSIS — R29898 Other symptoms and signs involving the musculoskeletal system: Secondary | ICD-10-CM | POA: Diagnosis not present

## 2017-07-19 DIAGNOSIS — R29818 Other symptoms and signs involving the nervous system: Secondary | ICD-10-CM

## 2017-07-19 DIAGNOSIS — I10 Essential (primary) hypertension: Secondary | ICD-10-CM

## 2017-07-19 DIAGNOSIS — Z8673 Personal history of transient ischemic attack (TIA), and cerebral infarction without residual deficits: Secondary | ICD-10-CM

## 2017-07-19 LAB — CBC
HCT: 42.8 % (ref 39.0–52.0)
HEMOGLOBIN: 14.4 g/dL (ref 13.0–17.0)
MCHC: 33.7 g/dL (ref 30.0–36.0)
MCV: 95.8 fl (ref 78.0–100.0)
Platelets: 224 10*3/uL (ref 150.0–400.0)
RBC: 4.47 Mil/uL (ref 4.22–5.81)
RDW: 15.5 % (ref 11.5–15.5)
WBC: 4.7 10*3/uL (ref 4.0–10.5)

## 2017-07-19 LAB — COMPREHENSIVE METABOLIC PANEL
ALT: 11 U/L (ref 0–53)
AST: 18 U/L (ref 0–37)
Albumin: 4.1 g/dL (ref 3.5–5.2)
Alkaline Phosphatase: 124 U/L — ABNORMAL HIGH (ref 39–117)
BILIRUBIN TOTAL: 0.7 mg/dL (ref 0.2–1.2)
BUN: 10 mg/dL (ref 6–23)
CO2: 29 meq/L (ref 19–32)
Calcium: 9.8 mg/dL (ref 8.4–10.5)
Chloride: 101 mEq/L (ref 96–112)
Creatinine, Ser: 1.13 mg/dL (ref 0.40–1.50)
GFR: 81.13 mL/min (ref >60.00)
GLUCOSE: 90 mg/dL (ref 70–99)
Potassium: 4.3 mEq/L (ref 3.5–5.1)
SODIUM: 137 meq/L (ref 135–145)
Total Protein: 7.4 g/dL (ref 6.0–8.3)

## 2017-07-19 LAB — LIPID PANEL
CHOL/HDL RATIO: 3
Cholesterol: 207 mg/dL — ABNORMAL HIGH (ref 0–200)
HDL: 66.3 mg/dL (ref >39.00)
LDL Cholesterol: 124 mg/dL — ABNORMAL HIGH (ref 0–99)
NONHDL: 140.61
Triglycerides: 83 mg/dL (ref 0.0–149.0)
VLDL: 16.6 mg/dL (ref 0.0–40.0)

## 2017-07-19 LAB — HEMOGLOBIN A1C: HEMOGLOBIN A1C: 6 % (ref 4.6–6.5)

## 2017-07-19 LAB — VITAMIN B12: Vitamin B-12: 359 pg/mL (ref 211–911)

## 2017-07-19 NOTE — Patient Instructions (Signed)
We have ordered a CT scan of the head to check for stroke.   We have ordered an ultrasound of the neck to look for blockages and we need to do blood work today.  We will get you in for physical therapy on the arm and you will get a call about this.

## 2017-07-19 NOTE — Progress Notes (Signed)
   Subjective:    Patient ID: Henry Carter, male    DOB: Oct 26, 1940, 77 y.o.   MRN: [id number]  HPI The patient is a 77 YO man coming in for possible stroke. He had change in December with weakness in his right arm. He is not able to grip as well. Some sensation change initially which is gradually improving. He still feels weak. This happened around Christmas but he did not seek care. He felt that it was not severe enough at that time. Denies numbness or weakness elsewhere. No change in speech. No change to memory. Not using the right hand as much due to the decrease in grip but not dropping things.   Review of Systems  Constitutional: Negative.   HENT: Negative.   Eyes: Negative.   Respiratory: Negative for cough, chest tightness and shortness of breath.   Cardiovascular: Negative for chest pain, palpitations and leg swelling.  Gastrointestinal: Negative for abdominal distention, abdominal pain, constipation, diarrhea, nausea and vomiting.  Musculoskeletal: Negative.   Skin: Negative.   Neurological: Positive for weakness and numbness. Negative for dizziness, tremors, syncope, facial asymmetry and speech difficulty.  Psychiatric/Behavioral: Negative.       Objective:   Physical Exam  Constitutional: He is oriented to person, place, and time. He appears well-developed and well-nourished.  HENT:  Head: Normocephalic and atraumatic.  Eyes: EOM are normal.  Neck: Normal range of motion.  Cardiovascular: Normal rate and regular rhythm.  Pulmonary/Chest: Effort normal and breath sounds normal. No respiratory distress. He has no wheezes. He has no rales.  Abdominal: Soft. Bowel sounds are normal. He exhibits no distension. There is no tenderness. There is no rebound.  Musculoskeletal: He exhibits no edema.  Neurological: He is alert and oriented to person, place, and time. A cranial nerve deficit is present. Coordination normal.  Mild decrease in sensation right arm, some mild weakness  compared to left arm but strength 4/5 bilateral UE, CN 2-12 intact.   Skin: Skin is warm and dry.  Psychiatric: He has a normal mood and affect.   Vitals:   07/19/17 1034 07/19/17 1055  BP: (!) 180/100 (!) 160/98  Pulse: 86   Temp: 98.4 F (36.9 C)   TempSrc: Oral   SpO2: 97%   Weight: 161 lb (73 kg)   Height: 5\' 9"  (1.753 m)       Assessment & Plan:

## 2017-07-21 DIAGNOSIS — R29898 Other symptoms and signs involving the musculoskeletal system: Secondary | ICD-10-CM | POA: Insufficient documentation

## 2017-07-21 NOTE — Assessment & Plan Note (Signed)
Concern for new stroke. Ordered CT head to evaluate as this is subacute. Ordered Carotid doppler. PT referral done for strength training as well.

## 2017-07-21 NOTE — Assessment & Plan Note (Signed)
Taking cardura, flomax. Denies wanting to change today as he has not taken today yet and prior BP okay. Needs close follow up.

## 2017-07-22 ENCOUNTER — Inpatient Hospital Stay: Admission: RE | Admit: 2017-07-22 | Payer: Medicare HMO | Source: Ambulatory Visit

## 2017-07-25 ENCOUNTER — Ambulatory Visit (HOSPITAL_COMMUNITY)
Admission: RE | Admit: 2017-07-25 | Payer: Medicare HMO | Source: Ambulatory Visit | Attending: Internal Medicine | Admitting: Internal Medicine

## 2017-07-26 ENCOUNTER — Ambulatory Visit: Payer: Medicare HMO | Attending: Internal Medicine

## 2017-07-27 ENCOUNTER — Encounter: Payer: Self-pay | Admitting: Internal Medicine

## 2017-07-27 ENCOUNTER — Ambulatory Visit (HOSPITAL_COMMUNITY)
Admission: RE | Admit: 2017-07-27 | Payer: Medicare HMO | Source: Ambulatory Visit | Attending: Internal Medicine | Admitting: Internal Medicine

## 2017-07-27 DIAGNOSIS — R0989 Other specified symptoms and signs involving the circulatory and respiratory systems: Secondary | ICD-10-CM

## 2017-08-01 ENCOUNTER — Other Ambulatory Visit (INDEPENDENT_AMBULATORY_CARE_PROVIDER_SITE_OTHER): Payer: Medicare HMO

## 2017-08-01 ENCOUNTER — Ambulatory Visit (INDEPENDENT_AMBULATORY_CARE_PROVIDER_SITE_OTHER): Payer: Medicare HMO | Admitting: Nurse Practitioner

## 2017-08-01 ENCOUNTER — Encounter: Payer: Self-pay | Admitting: Nurse Practitioner

## 2017-08-01 VITALS — BP 162/82 | HR 92 | Temp 98.4°F | Ht 69.0 in | Wt 176.0 lb

## 2017-08-01 DIAGNOSIS — I10 Essential (primary) hypertension: Secondary | ICD-10-CM

## 2017-08-01 DIAGNOSIS — N401 Enlarged prostate with lower urinary tract symptoms: Secondary | ICD-10-CM

## 2017-08-01 DIAGNOSIS — R300 Dysuria: Secondary | ICD-10-CM

## 2017-08-01 DIAGNOSIS — R351 Nocturia: Secondary | ICD-10-CM

## 2017-08-01 LAB — COMPREHENSIVE METABOLIC PANEL
ALBUMIN: 3.6 g/dL (ref 3.5–5.2)
ALK PHOS: 109 U/L (ref 39–117)
ALT: 26 U/L (ref 0–53)
AST: 22 U/L (ref 0–37)
BILIRUBIN TOTAL: 0.7 mg/dL (ref 0.2–1.2)
BUN: 9 mg/dL (ref 6–23)
CALCIUM: 9.2 mg/dL (ref 8.4–10.5)
CHLORIDE: 98 meq/L (ref 96–112)
CO2: 28 mEq/L (ref 19–32)
CREATININE: 0.97 mg/dL (ref 0.40–1.50)
GFR: 96.75 mL/min (ref >60.00)
Glucose, Bld: 94 mg/dL (ref 70–99)
Potassium: 3.4 mEq/L — ABNORMAL LOW (ref 3.5–5.1)
Sodium: 134 mEq/L — ABNORMAL LOW (ref 135–145)
TOTAL PROTEIN: 6.2 g/dL (ref 6.0–8.3)

## 2017-08-01 LAB — CBC
HCT: 36.7 % — ABNORMAL LOW (ref 39.0–52.0)
Hemoglobin: 12.5 g/dL — ABNORMAL LOW (ref 13.0–17.0)
MCHC: 34.1 g/dL (ref 30.0–36.0)
MCV: 94.5 fl (ref 78.0–100.0)
Platelets: 349 10*3/uL (ref 150.0–400.0)
RBC: 3.89 Mil/uL — AB (ref 4.22–5.81)
RDW: 15.4 % (ref 11.5–15.5)
WBC: 7.2 10*3/uL (ref 4.0–10.5)

## 2017-08-01 LAB — POCT URINALYSIS DIPSTICK
BILIRUBIN UA: NEGATIVE
GLUCOSE UA: NEGATIVE
KETONES UA: NEGATIVE
Nitrite, UA: POSITIVE
PH UA: 6 (ref 5.0–8.0)
Spec Grav, UA: >=1.03 — AB (ref 1.010–1.025)
UROBILINOGEN UA: NEGATIVE U/dL — AB

## 2017-08-01 MED ORDER — CIPROFLOXACIN HCL 500 MG PO TABS
500.0000 mg | ORAL_TABLET | Freq: Two times a day (BID) | ORAL | 0 refills | Status: DC
Start: 2017-08-01 — End: 2017-08-06

## 2017-08-01 NOTE — Assessment & Plan Note (Signed)
Continue current medications - PSA; Future - CBC; Future - Comprehensive metabolic panel; Future - Ambulatory referral to Urology

## 2017-08-01 NOTE — Progress Notes (Addendum)
Name: Henry Carter   MRN: [id number]    DOB: 1940/11/23   Date:08/01/2017       Progress Note  Subjective  Chief Complaint  Chief Complaint  Patient presents with  . Dysuria    states he has had burning with urination for the last couple of days    HPI  Dysuria- This is an acute problem The symptoms began yesterday He says it is has been difficult to begin urination and when he does urinate he is only able to void small drops and it is extremely painful He has had several episodes of urinary incontinence since yesterday He denies fevers, weakness, malaise, abdominal pain, nausea, vomiting, constipation, hematuria, penile discharge. He is not recently sexually active and does not voice concern for STDs today He did have diarrhea last week but this has resolved He is maintained on cardura and flomax for HTN and BPH, states he takes both medications daily as instructed His blood pressure is elevated today  BP Readings from Last 3 Encounters:  08/01/17 (!) 162/82  07/19/17 (!) 160/98  07/13/17 (!) 152/78     Patient Active Problem List   Diagnosis Date Noted  . Right arm weakness 07/21/2017  . Non compliance w medication regimen 07/30/2016  . Normal pressure hydrocephalus 12/11/2015  . Mild cognitive impairment 12/11/2015  . Tobacco abuse 05/16/2014  . Dizziness 07/03/2013  . 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 Healthcare maintenance 02/07/2013  . Iron deficiency anemia due to chronic blood loss 01/18/2013  . C O P D 04/08/2010  . Atherosclerosis of native artery of extremity with intermittent claudication ([place]) 11/26/2008  . Benign prostatic hyperplasia 10/20/2007  . PERIPHERAL VASCULAR DISEASE 05/19/2007  . Essential hypertension 02/01/2007  . HYPERLIPIDEMIA 01/03/2007    Social History   Tobacco Use  . Smoking status: Former Smoker    Packs/day: 0.25    Years: 50.00    Pack years: 12.50    Types: Cigarettes    Start date: 04/12/1957   Last attempt to quit: 04/12/2014    Years since quitting: 3.3  . Smokeless tobacco: Never Used  . Tobacco comment: using chatix; stopped now; took x 1 week / had stopped 3 years prior to starting back in 2005  Substance Use Topics  . Alcohol use: No    Alcohol/week: 0.0 oz    Comment: OCC     Current Outpatient Medications:  .  albuterol (PROVENTIL HFA;VENTOLIN HFA) 108 (90 BASE) MCG/ACT inhaler, Inhale 2 puffs into the lungs every 6 (six) hours as needed for wheezing., Disp: 1 Inhaler, Rfl: 3 .  aspirin 81 MG tablet, Take 81 mg by mouth daily.  , Disp: , Rfl:  .  clopidogrel (PLAVIX) 75 MG tablet, TAKE 1 TABLET(75 MG) BY MOUTH DAILY, Disp: 90 tablet, Rfl: 0 .  donepezil (ARICEPT) 10 MG tablet, Take 1 tablet daily, Disp: 90 tablet, Rfl: 3 .  doxazosin (CARDURA) 4 MG tablet, TAKE 1 TABLET BY MOUTH TWICE DAILY, Disp: 180 tablet, Rfl: 0 .  ferrous sulfate 325 (65 FE) MG tablet, Take 325 mg by mouth daily with breakfast., Disp: , Rfl:  .  gabapentin (NEURONTIN) 300 MG capsule, TAKE 1 CAPSULE BY MOUTH THREE TIMES DAILY, Disp: 270 capsule, Rfl: 0 .  meclizine (ANTIVERT) 25 MG tablet, Take 1 tablet (25 mg total) by mouth 3 (three) times daily as needed for dizziness., Disp: 30 tablet, Rfl: 0 .  tamsulosin (FLOMAX) 0.4 MG CAPS capsule, Take 1 capsule (0.4  mg total) by mouth daily. (Patient taking differently: Take 0.4 mg by mouth 2 times daily at 12 noon and 4 pm. ), Disp: 90 capsule, Rfl: 0 .  vitamin C (ASCORBIC ACID) 500 MG tablet, Take 500 mg by mouth daily., Disp: , Rfl:  .  ciprofloxacin (CIPRO) 500 MG tablet, Take 1 tablet (500 mg total) by mouth 2 (two) times daily., Disp: 28 tablet, Rfl: 0  Allergies  Allergen Reactions  . Ace Inhibitors     REACTION: antioedema  . Penicillins Hives    Has patient had a PCN reaction causing immediate rash, facial/tongue/throat swelling, SOB or lightheadedness with hypotension: No Has patient had a PCN reaction causing severe rash involving mucus  membranes or skin necrosis: No Has patient had a PCN reaction that required hospitalization yes Has patient had a PCN reaction occurring within the last 10 years: no If all of the above answers are "NO", then may proceed with Cephalosporin use.    ROS  No other specific complaints in a complete review of systems (except as listed in HPI above).  Objective  Vitals:   08/01/17 1603 08/01/17 1644  BP: (!) 180/92 (!) 162/82  Pulse: 92   Temp: 98.4 F (36.9 C)   TempSrc: Oral   SpO2: 97%   Weight: 176 lb (79.8 kg)   Height: 5\' 9"  (1.753 m)    Body mass index is 25.99 kg/m.  Nursing Note and Vital Signs reviewed.  Physical Exam Constitutional: Patient appears well-developed and well-nourished. No distress.  HEENT: head atraumatic, normocephalic, pupils equal and reactive to light, EOM's intact, neck supple without lymphadenopathy, oropharynx pink and moist without exudate Cardiovascular: Normal rate, regular rhythm, Distal pulses intact Pulmonary/Chest: Effort normal and breath sounds clear. No respiratory distress or retractions. Abdominal: Soft and non-tender, bowel sounds present x4 quadrants.  No CVA Tenderness. Skin: warm and dry. Psychiatric: Patient has a normal mood and affect. behavior is normal.   Assessment & Plan RTC in 1 week for F/U: HTN  Dysuria UTI vs prostatitis with concern for possible obstruction Labs ordered Start cipro course for infection-dosing and side effects discussed Urgent referral to urology today Return precautions including when to seek emergency care were reviewed and printed in AVS - PSA; Future - CBC; Future - Comprehensive metabolic panel; Future - ciprofloxacin (CIPRO) 500 MG tablet; Take 1 tablet (500 mg total) by mouth 2 (two) times daily.  Dispense: 28 tablet; Refill: 0 - Ambulatory referral to Urology - POCT urinalysis dipstick - Urine Culture; Future

## 2017-08-01 NOTE — Assessment & Plan Note (Signed)
BP Remains elevated but improved on second reading in clinic today Here for Evaluation and management of acute urinary symptoms today-could be contributing to his elevated BP He will continue current medications and return in 1 week for blood pressure follow up

## 2017-08-01 NOTE — Patient Instructions (Signed)
Please head downstairs for lab work/x-rays. If any of your test results are critically abnormal, you will be contacted right away. Your results may be released to your MyChart for viewing before I am able to provide you with my response. I will contact you within a week about your test results and any recommendations for abnormalities.  Please start antibiotics for your symptoms: Cipro 500 mg tablet twice daily for 14 days.  I have placed a referral to urology for further evaluation of your symptoms. Our office will call you to schedule this appointment. You should hear from our office in 7-10 days.  If you develop fevers, abdominal pian, worsening urinary pain or can not pee please go immediately to the Emergency Room.  Please return in about 1 week to Dr [name] for a blood pressure follow up

## 2017-08-02 LAB — PSA: PSA: 30.36 ng/mL — AB (ref 0.10–4.00)

## 2017-08-03 LAB — URINE CULTURE
MICRO NUMBER: 90489369
SPECIMEN QUALITY: ADEQUATE

## 2017-08-06 ENCOUNTER — Other Ambulatory Visit: Payer: Self-pay | Admitting: Nurse Practitioner

## 2017-08-06 DIAGNOSIS — R300 Dysuria: Secondary | ICD-10-CM

## 2017-08-06 MED ORDER — CIPROFLOXACIN HCL 500 MG PO TABS: 500.0000 mg | ORAL_TABLET | Freq: Two times a day (BID) | ORAL | 0 refills | Status: DC

## 2017-08-06 MED ORDER — CIPROFLOXACIN HCL 500 MG PO TABS
500.0000 mg | ORAL_TABLET | Freq: Two times a day (BID) | ORAL | 0 refills | Status: DC
Start: 2017-08-06 — End: 2017-12-22

## 2017-08-12 ENCOUNTER — Ambulatory Visit (HOSPITAL_COMMUNITY)
Admission: RE | Admit: 2017-08-12 | Discharge: 2017-08-12 | Disposition: A | Discharge status: Home / Self Care | Payer: Medicare HMO | Source: Ambulatory Visit | Attending: Cardiovascular Disease | Admitting: Cardiovascular Disease

## 2017-08-12 DIAGNOSIS — R29898 Other symptoms and signs involving the musculoskeletal system: Secondary | ICD-10-CM | POA: Diagnosis not present

## 2017-08-12 DIAGNOSIS — R972 Elevated prostate specific antigen [PSA]: Secondary | ICD-10-CM | POA: Diagnosis not present

## 2017-08-12 DIAGNOSIS — N3000 Acute cystitis without hematuria: Secondary | ICD-10-CM | POA: Diagnosis not present

## 2017-11-28 ENCOUNTER — Encounter: Payer: Self-pay | Admitting: Internal Medicine

## 2017-12-22 ENCOUNTER — Ambulatory Visit (INDEPENDENT_AMBULATORY_CARE_PROVIDER_SITE_OTHER): Payer: Medicare HMO | Admitting: Internal Medicine

## 2017-12-22 ENCOUNTER — Encounter: Payer: Self-pay | Admitting: Internal Medicine

## 2017-12-22 VITALS — BP 130/80 | HR 78 | Temp 98.1°F | Ht 69.0 in | Wt 157.0 lb

## 2017-12-22 DIAGNOSIS — Z23 Encounter for immunization: Secondary | ICD-10-CM

## 2017-12-22 DIAGNOSIS — G912 (Idiopathic) normal pressure hydrocephalus: Secondary | ICD-10-CM

## 2017-12-22 NOTE — Patient Instructions (Addendum)
We will order the MRI of the brain to check on the dizziness.

## 2017-12-22 NOTE — Assessment & Plan Note (Signed)
Checking MRI brain as none since 2017 and complaining of worsening dizziness symptoms.

## 2017-12-22 NOTE — Progress Notes (Signed)
   Subjective:    Patient ID: Henry Carter, male    DOB: 11/24/1940, 77 y.o.   MRN: [id number]  HPI The patient is a 77 YO man coming in for dizziness which is worsening. He had MRI about 2 years ago which showed some question of NPH. He saw neurology but was not willing to do lumbar puncture to evaluate. He feels that this is worsening. Takes meclizine after feeling dizzy in the morning. Denies falls due to dizziness but a lot of close calls. Denies fevers or chills. Denies vision changes.   Review of Systems  Constitutional: Negative.   HENT: Negative.   Eyes: Negative.   Respiratory: Negative for cough, chest tightness and shortness of breath.   Cardiovascular: Negative for chest pain, palpitations and leg swelling.  Gastrointestinal: Negative for abdominal distention, abdominal pain, constipation, diarrhea, nausea and vomiting.  Musculoskeletal: Negative.   Skin: Negative.   Neurological: Positive for dizziness.  Psychiatric/Behavioral: Negative.       Objective:   Physical Exam  Constitutional: He is oriented to person, place, and time. He appears well-developed and well-nourished.  HENT:  Head: Normocephalic and atraumatic.  Eyes: Pupils are equal, round, and reactive to light. EOM are normal.  No nystagmus  Neck: Normal range of motion.  Cardiovascular: Normal rate and regular rhythm.  Pulmonary/Chest: Effort normal and breath sounds normal. No respiratory distress. He has no wheezes. He has no rales.  Abdominal: Soft. Bowel sounds are normal. He exhibits no distension. There is no tenderness. There is no rebound.  Musculoskeletal: He exhibits no edema.  Neurological: He is alert and oriented to person, place, and time. Coordination normal.  Skin: Skin is warm and dry.  Psychiatric: He has a normal mood and affect.   Vitals:   12/22/17 1525  BP: 130/80  Pulse: 78  Temp: 98.1 F (36.7 C)  TempSrc: Oral  SpO2: 97%  Weight: 157 lb (71.2 kg)  Height: 5\' 9"  (1.753 m)       Assessment & Plan:  Flu shot given at visit

## 2017-12-30 ENCOUNTER — Ambulatory Visit
Admission: RE | Admit: 2017-12-30 | Discharge: 2017-12-30 | Disposition: A | Discharge status: Home / Self Care | Payer: Medicare HMO | Source: Ambulatory Visit | Attending: Internal Medicine | Admitting: Internal Medicine

## 2017-12-30 DIAGNOSIS — R42 Dizziness and giddiness: Secondary | ICD-10-CM | POA: Diagnosis not present

## 2017-12-30 DIAGNOSIS — G912 (Idiopathic) normal pressure hydrocephalus: Secondary | ICD-10-CM

## 2018-01-02 ENCOUNTER — Other Ambulatory Visit: Payer: Self-pay | Admitting: Family

## 2018-01-02 DIAGNOSIS — R42 Dizziness and giddiness: Secondary | ICD-10-CM

## 2018-01-02 DIAGNOSIS — G912 (Idiopathic) normal pressure hydrocephalus: Secondary | ICD-10-CM

## 2018-02-03 ENCOUNTER — Encounter: Payer: Self-pay | Admitting: Internal Medicine

## 2018-02-03 ENCOUNTER — Ambulatory Visit (INDEPENDENT_AMBULATORY_CARE_PROVIDER_SITE_OTHER): Payer: Medicare HMO | Admitting: Internal Medicine

## 2018-02-03 VITALS — BP 138/80 | HR 84 | Ht 69.0 in | Wt 158.0 lb

## 2018-02-03 DIAGNOSIS — Z86010 Personal history of colonic polyps: Secondary | ICD-10-CM

## 2018-02-03 DIAGNOSIS — Z8673 Personal history of transient ischemic attack (TIA), and cerebral infarction without residual deficits: Secondary | ICD-10-CM

## 2018-02-03 NOTE — Progress Notes (Signed)
Henry Carter 77 y.o. 17-Nov-1940 [id number]  Assessment & Plan:   Encounter Diagnoses  Name Primary?  Marland Kitchen Hx of colonic polyps Yes  . History of stroke    He should be on his Plavix but he is not taking it.  So we will go ahead and proceed with his colonoscopy in the very near future he is advised to return to his primary care provider to discuss his medical therapy  The risks and benefits as well as alternatives of endoscopic procedure(s) have been discussed and reviewed. All questions answered. The patient agrees to proceed.  CC: Henry Koch, MD  Subjective:   Chief Complaint: History of colon polyp  HPI Patient is here with his wife he has a history of an adenomatous colon polyp 5 years ago.  He received a recall letter.  He has been prescribed Plavix because of that completed stroke that occurred last Christmas time.  He waited several months before he presented for medical care.  He has been stable in that time but he says he is not taking his Plavix right now.  He is willing to have a repeat surveillance colonoscopy. Allergies  Allergen Reactions  . Ace Inhibitors     REACTION: antioedema  . Penicillins Hives    Has patient had a PCN reaction causing immediate rash, facial/tongue/throat swelling, SOB or lightheadedness with hypotension: No Has patient had a PCN reaction causing severe rash involving mucus membranes or skin necrosis: No Has patient had a PCN reaction that required hospitalization yes Has patient had a PCN reaction occurring within the last 10 years: no If all of the above answers are "NO", then may proceed with Cephalosporin use.   Current Meds  Medication Sig  . albuterol (PROVENTIL HFA;VENTOLIN HFA) 108 (90 BASE) MCG/ACT inhaler Inhale 2 puffs into the lungs every 6 (six) hours as needed for wheezing.  . clopidogrel (PLAVIX) 75 MG tablet TAKE 1 TABLET(75 MG) BY MOUTH DAILY  . doxazosin (CARDURA) 4 MG tablet TAKE 1 TABLET BY MOUTH TWICE  DAILY  . gabapentin (NEURONTIN) 300 MG capsule TAKE 1 CAPSULE BY MOUTH THREE TIMES DAILY  . tamsulosin (FLOMAX) 0.4 MG CAPS capsule Take 1 capsule (0.4 mg total) by mouth daily. (Patient taking differently: Take 0.4 mg by mouth 2 times daily at 12 noon and 4 pm. )  . vitamin C (ASCORBIC ACID) 500 MG tablet Take 500 mg by mouth daily.  . [DISCONTINUED] aspirin 81 MG tablet Take 81 mg by mouth daily.    . [DISCONTINUED] donepezil (ARICEPT) 10 MG tablet Take 1 tablet daily  . [DISCONTINUED] ferrous sulfate 325 (65 FE) MG tablet Take 325 mg by mouth daily with breakfast.  . [DISCONTINUED] meclizine (ANTIVERT) 25 MG tablet Take 1 tablet (25 mg total) by mouth 3 (three) times daily as needed for dizziness.   Past Medical History:  Diagnosis Date  .  iron deficiency 01/18/2013  . Anemia   . Angioedema   . BPH (benign prostatic hyperplasia)   . COPD (chronic obstructive pulmonary disease) ([place])    STates he has copd  . Essential hypertension   . GERD (gastroesophageal reflux disease)   . Helicobacter pylori gastritis 10/10/2012  . Hyperlipidemia   . IBS (irritable bowel syndrome)   . Impotence   . Peripheral vascular disease, unspecified ([place])   . Personal history of colonic adenoma 09/22/2012  . Personal history of tobacco use, presenting hazards to health    Past Surgical History:  Procedure Laterality Date  .  abdominal surg for ulcers'  1980  . COLONOSCOPY N/A 09/22/2012   Procedure: COLONOSCOPY;  Surgeon: Gatha Mayer, MD;  Location: [facility];  Service: Endoscopy;  Laterality: N/A;  . distal aortogram    . 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   Review of Systems As per HPI  Some dizziness patient of normal pressure hydrocephalus persistent right arm weakness after stroke  Objective:   Physical Exam BP 138/80   Pulse 84   Ht 5\' 9"  (1.753 m)   Wt 158 lb (71.7 kg)   BMI 23.33 kg/m   No acute distress Eyes anicteric Appropriate mood and affect Alert and oriented x3

## 2018-02-03 NOTE — Patient Instructions (Signed)
You have been scheduled for a colonoscopy. Please follow written instructions given to you at your visit today.  Please pick up your prep supplies at the pharmacy within the next 1-3 days. If you use inhalers (even only as needed), please bring them with you on the day of your procedure.   I appreciate the opportunity to care for you. Carl Gessner, MD, FACG 

## 2018-02-06 ENCOUNTER — Encounter: Payer: Self-pay | Admitting: Internal Medicine

## 2018-02-09 ENCOUNTER — Telehealth: Payer: Self-pay | Admitting: Internal Medicine

## 2018-02-09 NOTE — Telephone Encounter (Signed)
Ok no charge

## 2018-02-10 ENCOUNTER — Encounter: Payer: Medicare HMO | Admitting: Internal Medicine

## 2018-03-17 ENCOUNTER — Other Ambulatory Visit: Payer: Self-pay | Admitting: Internal Medicine

## 2018-07-11 ENCOUNTER — Other Ambulatory Visit: Payer: Self-pay

## 2018-07-11 ENCOUNTER — Other Ambulatory Visit (INDEPENDENT_AMBULATORY_CARE_PROVIDER_SITE_OTHER): Payer: Medicare HMO

## 2018-07-11 ENCOUNTER — Ambulatory Visit (INDEPENDENT_AMBULATORY_CARE_PROVIDER_SITE_OTHER): Payer: Medicare HMO | Admitting: Internal Medicine

## 2018-07-11 ENCOUNTER — Encounter: Payer: Self-pay | Admitting: Internal Medicine

## 2018-07-11 VITALS — BP 140/90 | HR 75 | Temp 97.4°F | Ht 69.0 in | Wt 157.0 lb

## 2018-07-11 DIAGNOSIS — I639 Cerebral infarction, unspecified: Secondary | ICD-10-CM

## 2018-07-11 DIAGNOSIS — G912 (Idiopathic) normal pressure hydrocephalus: Secondary | ICD-10-CM | POA: Diagnosis not present

## 2018-07-11 DIAGNOSIS — I10 Essential (primary) hypertension: Secondary | ICD-10-CM

## 2018-07-11 DIAGNOSIS — Z91148 Patient's other noncompliance with medication regimen for other reason: Secondary | ICD-10-CM

## 2018-07-11 DIAGNOSIS — Z9114 Patient's other noncompliance with medication regimen: Secondary | ICD-10-CM | POA: Diagnosis not present

## 2018-07-11 LAB — COMPREHENSIVE METABOLIC PANEL
ALT: 7 U/L (ref 0–53)
AST: 16 U/L (ref 0–37)
Albumin: 4 g/dL (ref 3.5–5.2)
Alkaline Phosphatase: 117 U/L (ref 39–117)
BILIRUBIN TOTAL: 0.6 mg/dL (ref 0.2–1.2)
BUN: 9 mg/dL (ref 6–23)
CO2: 28 meq/L (ref 19–32)
Calcium: 9.6 mg/dL (ref 8.4–10.5)
Chloride: 101 mEq/L (ref 96–112)
Creatinine, Ser: 1.03 mg/dL (ref 0.40–1.50)
GFR: 84.72 mL/min (ref >60.00)
GLUCOSE: 85 mg/dL (ref 70–99)
Potassium: 4.2 mEq/L (ref 3.5–5.1)
SODIUM: 136 meq/L (ref 135–145)
Total Protein: 6.9 g/dL (ref 6.0–8.3)

## 2018-07-11 LAB — LIPID PANEL
CHOL/HDL RATIO: 4
Cholesterol: 225 mg/dL — ABNORMAL HIGH (ref 0–200)
HDL: 60.5 mg/dL (ref >39.00)
LDL CALC: 148 mg/dL — AB (ref 0–99)
NonHDL: 164.91
TRIGLYCERIDES: 83 mg/dL (ref 0.0–149.0)
VLDL: 16.6 mg/dL (ref 0.0–40.0)

## 2018-07-11 LAB — CBC
HCT: 40.1 % (ref 39.0–52.0)
HEMOGLOBIN: 13.5 g/dL (ref 13.0–17.0)
MCHC: 33.7 g/dL (ref 30.0–36.0)
MCV: 94.5 fl (ref 78.0–100.0)
Platelets: 206 10*3/uL (ref 150.0–400.0)
RBC: 4.24 Mil/uL (ref 4.22–5.81)
RDW: 16.6 % — AB (ref 11.5–15.5)
WBC: 4 10*3/uL (ref 4.0–10.5)

## 2018-07-11 LAB — HEMOGLOBIN A1C: Hgb A1c MFr Bld: 6.1 % (ref 4.6–6.5)

## 2018-07-11 NOTE — Assessment & Plan Note (Signed)
Counseled that he needs to take medications daily to help prevent another stroke or progression of symptoms.

## 2018-07-11 NOTE — Assessment & Plan Note (Signed)
Needs CBC, CMP, lipid panel and HgA1c. BP is borderline and he is asked to resume his doxazosin BID which he is not taking currently.

## 2018-07-11 NOTE — Assessment & Plan Note (Signed)
Reinforced that he could have this and large volume LP could be beneficial. He again declines as he does not want to do this even after discussion about safety and potential benefit to the procedure.

## 2018-07-11 NOTE — Progress Notes (Signed)
   Subjective:   Patient ID: Henry Carter, male    DOB: [birth year], 78 y.o.   MRN: [id number]  HPI The patient is a 78 YO man coming in for several concerns including past stroke (supposed to be taking plavix but stopped taking regularly within the last several months, denies new numbness or weakness) and NPH (previously suggested on brain imaging, went to neurology declined LP for diagnostic purpose, has problems with walking and urinary accidents, wife thinks memory and balance is worsening, she is concerned about his safety with cooking, she does all finances, drives with supervision) and problems walking (his wife states he stopped all meds within the last few months, has had this for years although it is worsened, NPH and has declined LP in the past, denies fevers or chills, denies new weakness or numbness, uses cane and grabbing on to furniture more often lately, denies falls, overall stable in the last few weeks)  Review of Systems  Constitutional: Positive for activity change. Negative for appetite change, diaphoresis, fatigue, fever and unexpected weight change.  HENT: Negative.   Eyes: Negative.   Respiratory: Negative for cough, chest tightness and shortness of breath.   Cardiovascular: Negative for chest pain, palpitations and leg swelling.  Gastrointestinal: Negative for abdominal distention, abdominal pain, constipation, diarrhea, nausea and vomiting.  Genitourinary: Positive for enuresis, frequency and urgency.  Musculoskeletal: Negative.   Skin: Negative.   Neurological: Positive for weakness. Negative for dizziness, speech difficulty, light-headedness and headaches.       Memory changes  Psychiatric/Behavioral: Negative.        Irritability, memory changes    Objective:  Physical Exam Constitutional:      Appearance: He is well-developed.     Comments: thin  HENT:     Head: Normocephalic and atraumatic.  Neck:     Musculoskeletal: Normal range of motion.   Cardiovascular:     Rate and Rhythm: Normal rate and regular rhythm.  Pulmonary:     Effort: Pulmonary effort is normal. No respiratory distress.     Breath sounds: Normal breath sounds. No wheezing or rales.  Abdominal:     General: Bowel sounds are normal. There is no distension.     Palpations: Abdomen is soft.     Tenderness: There is no abdominal tenderness. There is no rebound.  Musculoskeletal:        General: No tenderness.  Skin:    General: Skin is warm and dry.  Neurological:     Mental Status: He is alert and oriented to person, place, and time.     Coordination: Coordination abnormal.     Comments: Cane for ambulation  Psychiatric:     Comments: Not very open during visit and not willing to share much     Vitals:   07/11/18 1256  BP: 140/90  Pulse: 75  Temp: (!) 97.4 F (36.3 C)  TempSrc: Oral  SpO2: 99%  Weight: 157 lb (71.2 kg)  Height: 5\' 9"  (1.753 m)    Assessment & Plan:

## 2018-07-11 NOTE — Assessment & Plan Note (Signed)
BP at goal, needs labs today. Adjust as needed. Asked to restart medications.

## 2018-07-11 NOTE — Patient Instructions (Signed)
Make sure to take the medicine every day to help prevent a stroke.

## 2018-07-13 ENCOUNTER — Other Ambulatory Visit: Payer: Self-pay | Admitting: Internal Medicine

## 2018-07-13 MED ORDER — SIMVASTATIN 20 MG PO TABS: 20.0000 mg | ORAL_TABLET | Freq: Every day | ORAL | 3 refills | Status: DC

## 2018-07-18 ENCOUNTER — Ambulatory Visit: Payer: Medicare HMO

## 2018-08-22 ENCOUNTER — Ambulatory Visit: Payer: Medicare HMO | Admitting: Neurology

## 2018-08-25 ENCOUNTER — Ambulatory Visit: Payer: Medicare HMO

## 2018-08-29 NOTE — Progress Notes (Unsigned)
Subjective:   Henry Carter is a 78 y.o. male who presents for Medicare Annual/Subsequent preventive examination.  Review of Systems:  No ROS.  Medicare Wellness Virtual Visit.  Visual/audio telehealth visit, UTA vital signs.   See social history for additional risk factors. I connected with patient by a telephone and verified that I am speaking with the correct person using two identifiers. Patient stated full name and DOB. Patient gave permission to continue with telephonic visit. Patient's location was at home and Nurse's location was at [place] office.     Sleep patterns: {SX; SLEEP PATTERNS:18802::"feels rested on waking","does not get up to void","gets up *** times nightly to void","sleeps *** hours nightly"}.    Home Safety/Smoke Alarms: Feels safe in home. Smoke alarms in place.  Living environment; residence and Firearm Safety: {Rehab home environment / accessibility:30080::"no firearms","firearms stored safely"}. Seat Belt Safety/Bike Helmet: Wears seat belt.   PSA-  Lab Results  Component Value Date   PSA 30.36 (H) 08/01/2017   PSA 4.04 (H) 01/05/2012   PSA 2.58 07/18/2009       Objective:    Vitals: There were no vitals taken for this visit.  There is no height or weight on file to calculate BMI.  Advanced Directives 07/13/2017 01/12/2016 10/16/2015 10/16/2015 01/13/2015 01/06/2015 07/25/2014  Does Patient Have a Medical Advance Directive? No No No Yes No No No  Copy of Healthcare Power of Attorney in Chart? - - - No - copy requested - - -  Would patient like information on creating a medical advance directive? Yes (ED - Information included in AVS) No - patient declined information - - - Yes - Scientist, clinical (histocompatibility and immunogenetics) given Yes - Scientist, clinical (histocompatibility and immunogenetics) given  Pre-existing out of facility DNR order (yellow form or pink MOST form) - - - - - - -    Tobacco Social History   Tobacco Use  Smoking Status Former Smoker  . Packs/day: 0.25  . Years: 50.00  . Pack years: 12.50   . Types: Cigarettes  . Start date: 04/12/1957  . Last attempt to quit: 04/12/2014  . Years since quitting: 4.3  Smokeless Tobacco Never Used  Tobacco Comment   using chatix; stopped now; took x 1 week / had stopped 3 years prior to starting back in 2005     Counseling given: Not Answered Comment: using chatix; stopped now; took x 1 week / had stopped 3 years prior to starting back in 2005  Past Medical History:  Diagnosis Date  .  iron deficiency 01/18/2013  . Anemia   . Angioedema   . BPH (benign prostatic hyperplasia)   . COPD (chronic obstructive pulmonary disease) ([place])    STates he has copd  . Essential hypertension   . GERD (gastroesophageal reflux disease)   . Helicobacter pylori gastritis 10/10/2012  . Hyperlipidemia   . IBS (irritable bowel syndrome)   . Impotence   . Peripheral vascular disease, unspecified ([place])   . Personal history of colonic adenoma 09/22/2012  . Personal history of tobacco use, presenting hazards to health    Past Surgical History:  Procedure Laterality Date  . abdominal surg for ulcers'  1980  . COLONOSCOPY N/A 09/22/2012   Procedure: COLONOSCOPY;  Surgeon: Gatha Mayer, MD;  Location: [facility];  Service: Endoscopy;  Laterality: N/A;  . distal aortogram    . percutaneous transluminal angionplasty with placement of 2 self expanding stent in the distal left superfical femeral artery     Family History  Problem Relation  Age of Onset  . Alcohol abuse Father   . Kidney disease Brother   . Drug abuse Brother   . Diabetes Brother   . Diabetes Mother   . Diabetes Brother   . Colon cancer Neg Hx    Social History   Socioeconomic History  . Marital status: Married    Spouse name: Not on file  . Number of children: 4  . Years of education: Not on file  . Highest education level: Not on file  Occupational History  . Not on file  Social Needs  . Financial resource strain: Not hard at all  . Food insecurity:    Worry: Never true     Inability: Never true  . Transportation needs:    Medical: No    Non-medical: No  Tobacco Use  . Smoking status: Former Smoker    Packs/day: 0.25    Years: 50.00    Pack years: 12.50    Types: Cigarettes    Start date: 04/12/1957    Last attempt to quit: 04/12/2014    Years since quitting: 4.3  . Smokeless tobacco: Never Used  . Tobacco comment: using chatix; stopped now; took x 1 week / had stopped 3 years prior to starting back in 2005  Substance and Sexual Activity  . Alcohol use: No    Alcohol/week: 0.0 standard drinks    Comment: OCC  . Drug use: Yes    Comment: States marijuanna relaxes him;   . Sexual activity: Not Currently    Comment: marijuanna  Lifestyle  . Physical activity:    Days per week: 4 days    Minutes per session: 40 min  . Stress: Not at all  Relationships  . Social connections:    Talks on phone: More than three times a week    Gets together: More than three times a week    Attends religious service: More than 4 times per year    Active member of club or organization: Yes    Attends meetings of clubs or organizations: More than 4 times per year    Relationship status: Married  Other Topics Concern  . 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 albuterol (PROVENTIL HFA;VENTOLIN HFA) 108 (90 BASE) MCG/ACT inhaler Inhale 2 puffs into the lungs every 6 (six) hours as needed for wheezing. (Patient not taking: Reported on 07/11/2018)  . clopidogrel (PLAVIX) 75 MG tablet TAKE 1 TABLET(75 MG) BY MOUTH DAILY  . doxazosin (CARDURA) 4 MG tablet TAKE 1 TABLET BY MOUTH TWICE DAILY  . gabapentin (NEURONTIN) 300 MG capsule TAKE 1 CAPSULE BY MOUTH THREE TIMES DAILY  . simvastatin (ZOCOR) 20 MG tablet Take 1 tablet (20 mg total) by mouth at bedtime.  . tamsulosin (FLOMAX) 0.4  MG CAPS capsule Take 1 capsule (0.4 mg total) by mouth daily. (Patient taking differently: Take 0.4 mg by mouth 2 times daily at 12 noon and 4 pm. )  . vitamin C (ASCORBIC ACID) 500 MG tablet Take 500 mg by mouth daily.   No facility-administered encounter medications on file as of 08/30/2018.     Activities of Daily Living No flowsheet data found.  Patient Care Team: [name], MD as PCP - General (Internal Medicine) Rigoberto Noel, MD (Pulmonary Disease) Early,  Arvilla Meres, MD (Vascular Surgery) Burnell Blanks, MD (Cardiology)   Assessment:   This is a routine wellness examination for K[place]. Physical assessment deferred to PCP.  Exercise Activities and Dietary recommendations   Diet (meal preparation, eat out, water intake, caffeinated beverages, dairy products, fruits and vegetables): {Desc; diets:16563}   Goals    . Exercise 3x per week (30 min per time)     Wants to get back on the golf;  As much as possible; If right hip and leg is better; will play 3 times per week    . patient     Get back on golf course; COPD is challenging; will go to the driving range and hit balls    . Patient Stated     Eat healthier by monitoring salt, fat, cholesterol, and drink more water. Continue to ride my stationary bike and walk. Enjoy life, family and play golf.        Fall Risk Fall Risk  07/13/2017 01/10/2017 09/16/2016 03/18/2016 12/11/2015  Falls in the past year? No No No No No  Comment - - - - -  Number falls in past yr: - - - - -  Injury with Fall? - - - - -  Risk for fall due to : - - - - -  Risk for fall due to: Comment - - - - -  Follow up - - - - -    Depression Screen PHQ 2/9 Scores 07/13/2017 01/10/2017 10/16/2015 10/16/2015  PHQ - 2 Score 0 0 0 0  PHQ- 9 Score 0 - - -    Cognitive Function MMSE - Mini Mental State Exam 07/13/2017 09/16/2016 12/11/2015 10/16/2015 07/25/2014  Not completed: - - - (No Data) -  Orientation to time 0 3 4 - 5  Orientation to Place 5 5 5   - 5  Registration 3 3 3  - 3  Attention/ Calculation 5 2 2  - 1  Recall 2 2 2  - 2  Recall-comments - - - - third with cue  Language- name 2 objects 2 2 2  - 2  Language- repeat 1 1 1  - 1  Language- follow 3 step command 3 3 3  - 3  Language- read & follow direction 1 1 1  - 1  Write a sentence 1 1 1  - 1  Copy design 1 1 1  - 1  Total score 24 24 25  - 25   Montreal Cognitive Assessment  12/23/2013  Visuospatial/ Executive (0/5) 3  Naming (0/3) 3  Attention: Read list of digits (0/2) 2  Attention: Read list of letters (0/1) 1  Attention: Serial 7 subtraction starting at 100 (0/3) 2  Language: Repeat phrase (0/2) 2  Language : Fluency (0/1) 0  Abstraction (0/2) 2  Delayed Recall (0/5) 3  Orientation (0/6) 6  Total 24  Adjusted Score (based on education) 24      Immunization History  Administered Date(s) Administered  . Influenza Split 01/05/2012, 01/01/2013  . Influenza, High Dose Seasonal PF 01/10/2017, 12/22/2017  . Influenza,inj,Quad PF,6+ Mos 12/21/2013, 12/20/2014, 12/23/2015  . PPD Test 09/11/2012  . Pneumococcal Conjugate-13 02/06/2013  . Pneumococcal Polysaccharide-23 04/17/2013  . Td 07/09/2007   Screening Tests Health Maintenance  Topic Date Due  . TETANUS/TDAP  12/23/2018 (Originally 07/08/2017)  . INFLUENZA VACCINE  11/11/2018  . PNA vac Low Risk Adult  Completed        Plan:     I have personally reviewed and noted the following in the patient's chart:   .  Medical and social history . Use of alcohol, tobacco or illicit drugs  . Current medications and supplements . Functional ability and status . Nutritional status . Physical activity . Advanced directives . List of other physicians . Screenings to include cognitive, depression, and falls . Referrals and appointments  In addition, I have reviewed and discussed with patient certain preventive protocols, quality metrics, and best practice recommendations. A written personalized care plan for preventive  services as well as general preventive health recommendations were provided to patient.     Michiel Cowboy, RN  08/29/2018

## 2018-08-30 ENCOUNTER — Telehealth: Payer: Self-pay | Admitting: *Deleted

## 2018-08-30 ENCOUNTER — Ambulatory Visit: Payer: Medicare HMO

## 2018-08-30 NOTE — Telephone Encounter (Signed)
Nurse called patient 3 times to connect with him to complete scheduled AWV. Nurse received no answer and she did LVM requesting that patient call her back.

## 2018-09-15 ENCOUNTER — Encounter: Payer: Self-pay | Admitting: Neurology

## 2018-09-26 ENCOUNTER — Ambulatory Visit: Payer: Medicare HMO | Admitting: Neurology

## 2018-12-20 ENCOUNTER — Other Ambulatory Visit: Payer: Self-pay

## 2018-12-20 ENCOUNTER — Emergency Department (HOSPITAL_COMMUNITY)
Admission: EM | Admit: 2018-12-20 | Discharge: 2018-12-20 | Disposition: A | Discharge status: Home / Self Care | Payer: Medicare HMO | Attending: Emergency Medicine | Admitting: Emergency Medicine

## 2018-12-20 DIAGNOSIS — Z7901 Long term (current) use of anticoagulants: Secondary | ICD-10-CM | POA: Diagnosis not present

## 2018-12-20 DIAGNOSIS — Z79899 Other long term (current) drug therapy: Secondary | ICD-10-CM | POA: Diagnosis not present

## 2018-12-20 DIAGNOSIS — Z87891 Personal history of nicotine dependence: Secondary | ICD-10-CM | POA: Diagnosis not present

## 2018-12-20 DIAGNOSIS — I10 Essential (primary) hypertension: Secondary | ICD-10-CM | POA: Insufficient documentation

## 2018-12-20 DIAGNOSIS — Z87438 Personal history of other diseases of male genital organs: Secondary | ICD-10-CM

## 2018-12-20 DIAGNOSIS — J449 Chronic obstructive pulmonary disease, unspecified: Secondary | ICD-10-CM | POA: Diagnosis not present

## 2018-12-20 DIAGNOSIS — F1210 Cannabis abuse, uncomplicated: Secondary | ICD-10-CM | POA: Diagnosis not present

## 2018-12-20 DIAGNOSIS — R339 Retention of urine, unspecified: Secondary | ICD-10-CM | POA: Diagnosis not present

## 2018-12-20 LAB — URINALYSIS, ROUTINE W REFLEX MICROSCOPIC
Bilirubin Urine: NEGATIVE
Glucose, UA: NEGATIVE mg/dL
Ketones, ur: NEGATIVE mg/dL
Leukocytes,Ua: NEGATIVE
Nitrite: NEGATIVE
Protein, ur: NEGATIVE mg/dL
Specific Gravity, Urine: 1.009 (ref 1.005–1.030)
pH: 5 (ref 5.0–8.0)

## 2018-12-20 LAB — CBC
HCT: 39.9 % (ref 39.0–52.0)
Hemoglobin: 13 g/dL (ref 13.0–17.0)
MCH: 31.1 pg (ref 26.0–34.0)
MCHC: 32.6 g/dL (ref 30.0–36.0)
MCV: 95.5 fL (ref 80.0–100.0)
Platelets: 237 10*3/uL (ref 150–400)
RBC: 4.18 MIL/uL — ABNORMAL LOW (ref 4.22–5.81)
RDW: 14.9 % (ref 11.5–15.5)
WBC: 7.5 10*3/uL (ref 4.0–10.5)
nRBC: 0 % (ref 0.0–0.2)

## 2018-12-20 LAB — BASIC METABOLIC PANEL
Anion gap: 10 (ref 5–15)
BUN: 11 mg/dL (ref 8–23)
CO2: 23 mmol/L (ref 22–32)
Calcium: 9.7 mg/dL (ref 8.9–10.3)
Chloride: 102 mmol/L (ref 98–111)
Creatinine, Ser: 1.18 mg/dL (ref 0.61–1.24)
GFR calc Af Amer: >60 mL/min (ref >60)
GFR calc non Af Amer: 59 mL/min — ABNORMAL LOW (ref >60)
Glucose, Bld: 120 mg/dL — ABNORMAL HIGH (ref 70–99)
Potassium: 4.6 mmol/L (ref 3.5–5.1)
Sodium: 135 mmol/L (ref 135–145)

## 2018-12-20 NOTE — ED Triage Notes (Signed)
Pt endorses urinary retention, has hx of prostate issues. Has not urinated since yesterday. Hypertensive, no hx.

## 2018-12-20 NOTE — ED Provider Notes (Signed)
[facility] EMERGENCY DEPARTMENT Provider Note   CSN: [id number] Arrival date & time: 12/20/18  1001     History   Chief Complaint Chief Complaint  Patient presents with  . Urinary Retention    HPI Henry Carter is a 79 y.o. male.     Henry Carter is a 78 y.o. male with a history of BPH, hypertension, hyperlipidemia, COPD, who presents to the emergency department for evaluation of urinary retention.  Patient reports he has been unable to urinate since yesterday, unsure of what time, thinks it was sometime in the afternoon or evening.  Reports he feels like he needs to go, but is unable to.  Reports pressure in the lower abdomen over his bladder which has worsened this morning.  He reports that he has a history of enlarged prostate and often has difficulty urinating but has never had urinary retention or required catheterization.  He denies any hematuria, no burning or pain with urination.  No fevers.  No nausea or vomiting.  He denies any back pain, numbness tingling or weakness in his lower extremities.  No other aggravating or alleviating factors.     Past Medical History:  Diagnosis Date  .  iron deficiency 01/18/2013  . Anemia   . Angioedema   . BPH (benign prostatic hyperplasia)   . COPD (chronic obstructive pulmonary disease) ([place])    STates he has copd  . Essential hypertension   . GERD (gastroesophageal reflux disease)   . Helicobacter pylori gastritis 10/10/2012  . Hyperlipidemia   . IBS (irritable bowel syndrome)   . Impotence   . Peripheral vascular disease, unspecified ([place])   . Personal history of colonic adenoma 09/22/2012  . Personal history of tobacco use, presenting hazards to health     Patient Active Problem List   Diagnosis Date Noted  . 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[place]) 07/11/2018  . Right arm weakness 07/21/2017  . Non compliance w medication regimen 07/30/2016  . 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 Mild cognitive  impairment 12/11/2015  . Tobacco abuse 05/16/2014  . Dizziness 07/03/2013  . 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 Healthcare maintenance 02/07/2013  . Iron deficiency anemia due to chronic blood loss 01/18/2013  . C O P D 04/08/2010  . Atherosclerosis of native artery of extremity with intermittent claudication ([place]) 11/26/2008  . Benign prostatic hyperplasia 10/20/2007  . PERIPHERAL VASCULAR DISEASE 05/19/2007  . Essential hypertension 02/01/2007  . HYPERLIPIDEMIA 01/03/2007    Past Surgical History:  Procedure Laterality Date  . abdominal surg for ulcers'  1980  . COLONOSCOPY N/A 09/22/2012   Procedure: COLONOSCOPY;  Surgeon: Gatha Mayer, MD;  Location: [facility] END[facility]COPY;  Service: Endoscopy;  Laterality: N/A;  . distal aortogram    . percutaneous transluminal angionplasty with placement of 2 self expanding stent in the distal left superfical femeral artery          Home Medications    Prior to Admission medications   Medication Sig Start Date End Date Taking? Authorizing Provider  gabapentin (NEURONTIN) 300 MG capsule TAKE 1 CAPSULE BY MOUTH THREE TIMES DAILY Patient taking differently: Take 300 mg by mouth 2 (two) times daily.  03/03/16  Yes [name], MD  simvastatin (ZOCOR) 20 MG tablet Take 1 tablet (20 mg total) by mouth at bedtime. 07/13/18  Yes [name], MD  tamsulosin (FLOMAX) 0.4 MG CAPS capsule Take 1 capsule (0.4 mg total) by mouth daily. Patient taking differently: Take  0.4 mg by mouth 2 times daily at 12 noon and 4 pm.  04/10/14  Yes [name], MD  vitamin C (ASCORBIC ACID) 500 MG tablet Take 500 mg by mouth daily.   Yes [provider]  albuterol (PROVENTIL HFA;VENTOLIN HFA) 108 (90 BASE) MCG/ACT inhaler Inhale 2 puffs into the lungs every 6 (six) hours as needed for wheezing. Patient not taking: Reported on 12/20/2018 11/26/13   Saguier, Percell Miller, PA-C  clopidogrel (PLAVIX) 75 MG tablet TAKE 1 TABLET(75 MG)  BY MOUTH DAILY Patient taking differently: Take 75 mg by mouth daily.  12/11/15   [name], MD  doxazosin (CARDURA) 4 MG tablet TAKE 1 TABLET BY MOUTH TWICE DAILY Patient taking differently: Take 4 mg by mouth 2 (two) times daily.  03/17/18   [name], MD    Family History Family History  Problem Relation Age of Onset  . Alcohol abuse Father   . Kidney disease Brother   . Drug abuse Brother   . Diabetes Brother   . Diabetes Mother   . Diabetes Brother   . Colon cancer Neg Hx     Social History Social History   Tobacco Use  . Smoking status: Former Smoker    Packs/day: 0.25    Years: 50.00    Pack years: 12.50    Types: Cigarettes    Start date: 04/12/1957    Quit date: 04/12/2014    Years since quitting: 4.6  . Smokeless tobacco: Never Used  . Tobacco comment: using chatix; stopped now; took x 1 week / had stopped 3 years prior to starting back in 2005  Substance Use Topics  . Alcohol use: No    Alcohol/week: 0.0 standard drinks    Comment: OCC  . Drug use: Yes    Comment: States marijuanna relaxes him;      Allergies   Ace inhibitors and Penicillins   Review of Systems Review of Systems  Constitutional: Negative for chills and fever.  Respiratory: Negative for cough and shortness of breath.   Cardiovascular: Negative for chest pain.  Gastrointestinal: Positive for abdominal pain. Negative for constipation, diarrhea, nausea and vomiting.  Genitourinary: Positive for difficulty urinating. Negative for dysuria, flank pain, frequency, hematuria, penile pain, penile swelling, scrotal swelling and testicular pain.  All other systems reviewed and are negative.    Physical Exam Updated Vital Signs BP (!) 169/97   Pulse (!) 50   Temp 97.6 F (36.4 C) (Oral)   Resp 14   Ht 5\' 9"  (1.753 m)   Wt 71.7 kg   SpO2 98%   BMI 23.33 kg/m   Physical Exam Vitals signs and nursing note reviewed.  Constitutional:      General: He is not in acute  distress.    Appearance: He is well-developed and normal weight. He is not diaphoretic.     Comments: Patient appears uncomfortable, but is in no acute distress.  HENT:     Head: Normocephalic and atraumatic.  Eyes:     General:        Right eye: No discharge.        Left eye: No discharge.  Neck:     Musculoskeletal: Neck supple.  Cardiovascular:     Rate and Rhythm: Normal rate and regular rhythm.     Pulses: Normal pulses.     Heart sounds: Normal heart sounds. No murmur. No friction rub. No gallop.   Pulmonary:     Effort: Pulmonary effort is normal. No  respiratory distress.     Breath sounds: Normal breath sounds.     Comments: Respirations equal and unlabored, patient able to speak in full sentences, lungs clear to auscultation bilaterally Abdominal:     General: Abdomen is flat. Bowel sounds are normal. There is no distension.     Palpations: There is no mass.     Tenderness: There is abdominal tenderness. There is no guarding.     Comments: Abdomen is soft, nondistended, bowel sounds present throughout, there is some tenderness over the suprapubic region without guarding or peritoneal signs, no CVA tenderness bilaterally.  Musculoskeletal:     Right lower leg: No edema.     Left lower leg: No edema.  Skin:    General: Skin is warm and dry.  Neurological:     Mental Status: He is alert and oriented to person, place, and time.     Coordination: Coordination normal.     Comments: Speech is clear, alert, following commands. Moving all extremities with normal coordination.  Psychiatric:        Mood and Affect: Mood normal.        Behavior: Behavior normal.      ED Treatments / Results  Labs (all labs ordered are listed, but only abnormal results are displayed) Labs Reviewed  URINALYSIS, ROUTINE W REFLEX MICROSCOPIC - Abnormal; Notable for the following components:      Result Value   Color, Urine STRAW (*)    Hgb urine dipstick SMALL (*)    Bacteria, UA RARE (*)     All other components within normal limits  BASIC METABOLIC PANEL - Abnormal; Notable for the following components:   Glucose, Bld 120 (*)    GFR calc non Af Amer 59 (*)    All other components within normal limits  CBC - Abnormal; Notable for the following components:   RBC 4.18 (*)    All other components within normal limits  URINE CULTURE    EKG None  Radiology No results found.  Procedures Procedures (including critical care time)  Medications Ordered in ED Medications - No data to display   Initial Impression / Assessment and Plan / ED Course  I have reviewed the triage vital signs and the nursing notes.  Pertinent labs & imaging results that were available during my care of the patient were reviewed by me and considered in my medical decision making (see chart for details).   78 year old male with known history of BPH presents with acute urinary retention.  Unable to urinate since yesterday, reports he often has difficulty urinating but has never had retention and required catheterization, has not followed by a urologist.  On arrival patient is also very hypertensive, denies previous history, I suggest this is likely related to pain from urinary retention.  Bladder scan on arrival shows 707 mL's, Foley catheter placed.  Will check renal function and urinalysis.  After Foley catheter placed patient had significant relief in his pain and his blood pressure has been steadily improving.  Alysis shows no signs of infection and patient with normal renal function and no significant electrolyte derangements.  No leukocytosis and normal hemoglobin.  Patient will be switched to leg bag and discharged with Foley catheter in place with urology follow-up.  Return precautions discussed.  Patient expresses understanding and agreement with plan.  Discharged home in good condition.  Patient discussed with Dr. Sedonia Small, who saw patient as well and agrees with plan.   Final Clinical  Impressions(s) / ED Diagnoses  Final diagnoses:  Urinary retention  History of BPH    ED Discharge Orders    None       Jacqlyn Larsen, [place] 12/20/18 1521    Maudie Flakes, MD 12/21/18 [phone]

## 2018-12-20 NOTE — ED Notes (Signed)
Pt verbalizes understanding of discharge instructions. Opportunity for questions and answers provided. Admission discussed, follow up care explained.

## 2018-12-20 NOTE — Discharge Instructions (Addendum)
You have acute urinary retention likely related to your enlarged prostate.  We will leave the catheter in place to help you continue to be able to drain urine, you will need to follow-up with the urologist please call today to schedule an appointment, they will take the catheter out when they see fit.  If you develop fevers, blood in your urine, vomiting, your catheter is not draining or any other new or concerning symptoms return to the ED for reevaluation

## 2018-12-21 LAB — URINE CULTURE: Culture: NO GROWTH

## 2018-12-25 DIAGNOSIS — R338 Other retention of urine: Secondary | ICD-10-CM | POA: Diagnosis not present

## 2018-12-27 DIAGNOSIS — R338 Other retention of urine: Secondary | ICD-10-CM | POA: Diagnosis not present

## 2019-01-03 DIAGNOSIS — R338 Other retention of urine: Secondary | ICD-10-CM | POA: Diagnosis not present

## 2019-01-03 DIAGNOSIS — R972 Elevated prostate specific antigen [PSA]: Secondary | ICD-10-CM | POA: Diagnosis not present

## 2019-01-03 DIAGNOSIS — N3000 Acute cystitis without hematuria: Secondary | ICD-10-CM | POA: Diagnosis not present

## 2019-01-04 DIAGNOSIS — R338 Other retention of urine: Secondary | ICD-10-CM | POA: Diagnosis not present

## 2019-01-19 DIAGNOSIS — R338 Other retention of urine: Secondary | ICD-10-CM | POA: Diagnosis not present

## 2019-01-24 DIAGNOSIS — R972 Elevated prostate specific antigen [PSA]: Secondary | ICD-10-CM | POA: Diagnosis not present

## 2019-01-25 DIAGNOSIS — R338 Other retention of urine: Secondary | ICD-10-CM | POA: Diagnosis not present

## 2019-02-20 DIAGNOSIS — R338 Other retention of urine: Secondary | ICD-10-CM | POA: Diagnosis not present

## 2019-03-01 DIAGNOSIS — R972 Elevated prostate specific antigen [PSA]: Secondary | ICD-10-CM | POA: Diagnosis not present

## 2019-03-19 DIAGNOSIS — R338 Other retention of urine: Secondary | ICD-10-CM | POA: Diagnosis not present

## 2019-03-20 ENCOUNTER — Telehealth: Payer: Self-pay | Admitting: Internal Medicine

## 2019-03-20 NOTE — Telephone Encounter (Signed)
Is this something we can do?

## 2019-03-20 NOTE — Telephone Encounter (Signed)
Noted thanks °

## 2019-03-20 NOTE — Telephone Encounter (Signed)
Patient scheduled on 04/01/2019 at 10:40am for surgical clearance.

## 2019-03-20 NOTE — Telephone Encounter (Signed)
If they need medical clearance from [place] he would likely need visit.

## 2019-03-20 NOTE — Telephone Encounter (Signed)
Can you make patient a visit to discuss surgical clearance. Thank you

## 2019-03-20 NOTE — Telephone Encounter (Signed)
Geraldine Solar, from [facility] urology, and is needing to have clarification on the pts medications to have medical clearance for pt. Please advise.

## 2019-03-22 ENCOUNTER — Other Ambulatory Visit: Payer: Self-pay

## 2019-03-22 ENCOUNTER — Ambulatory Visit (INDEPENDENT_AMBULATORY_CARE_PROVIDER_SITE_OTHER): Payer: Medicare HMO | Admitting: Internal Medicine

## 2019-03-22 ENCOUNTER — Encounter: Payer: Self-pay | Admitting: Internal Medicine

## 2019-03-22 VITALS — BP 130/100 | HR 67 | Temp 97.4°F | Ht 69.0 in | Wt 149.0 lb

## 2019-03-22 DIAGNOSIS — Z01810 Encounter for preprocedural cardiovascular examination: Secondary | ICD-10-CM | POA: Diagnosis not present

## 2019-03-22 DIAGNOSIS — I639 Cerebral infarction, unspecified: Secondary | ICD-10-CM | POA: Diagnosis not present

## 2019-03-22 DIAGNOSIS — G912 (Idiopathic) normal pressure hydrocephalus: Secondary | ICD-10-CM | POA: Diagnosis not present

## 2019-03-22 DIAGNOSIS — Z23 Encounter for immunization: Secondary | ICD-10-CM

## 2019-03-22 DIAGNOSIS — Z9114 Patient's other noncompliance with medication regimen: Secondary | ICD-10-CM

## 2019-03-22 MED ORDER — CLOPIDOGREL BISULFATE 75 MG PO TABS: 75.0000 mg | ORAL_TABLET | Freq: Every day | ORAL | 3 refills | Status: DC

## 2019-03-22 NOTE — Assessment & Plan Note (Signed)
Recent labs acceptable, EKG done and no significant change from prior. Exercise tolerance okay to proceed without further testing. Okay to hold plavix for 1 week prior to procedure if needed.

## 2019-03-22 NOTE — Assessment & Plan Note (Signed)
Needs to take plavix and simvastatin. Declines BP medication today and BP is elevated but close to goal at prior visit. Advised of risk of another stroke if missing medications and with high BP.

## 2019-03-22 NOTE — Assessment & Plan Note (Signed)
Counseled again about the long term effects which can be non-reversible. Advised to strongly consider seeing neurology and he is very hesitant about the spinal tap.

## 2019-03-22 NOTE — Assessment & Plan Note (Signed)
Reminded about the importance of taking medications as prescribed and not stopping on his own.

## 2019-03-22 NOTE — Telephone Encounter (Signed)
Low risk for upcoming procedure. Okay to hold plavix for 1 week prior if needed.

## 2019-03-22 NOTE — Telephone Encounter (Signed)
Tried calling but it just kept saying person not available

## 2019-03-22 NOTE — Patient Instructions (Addendum)
We want you to start taking plavix (clopidogrel) 1 pill daily to prevent stroke.   We also want you to start taking the cholesterol medicine simvastatin 1 pill daily.   You will stop the plavix 1 week before procedure and ask urologist when to restart.   Think about treating the normal pressure hydrocephalus as this will worsen with time and can cause memory, balance problems which may not be reversible even if treated later.

## 2019-03-22 NOTE — Telephone Encounter (Signed)
LVM for Henry Carter informing of MD response

## 2019-03-22 NOTE — Progress Notes (Signed)
   Subjective:   Patient ID: Henry Carter, male    DOB: 12-17-1940, 78 y.o.   MRN: [id number]  HPI The patient is a 78 YO man coming in for pre-operative clearance. Having some kind of prostate procedure (likely TURP from description by patient). He is not currently taking any of his medications. Denies chest pains. Denies SOB and can walk up flight up stairs. Non-smoker still. Denies cough or nasal congestion. Denies fevers or chills. Denies diarrhea or constipation or abdominal pain. Recent labs from September stable. Denies new stroke symptoms such as weakness or numbness.   PMH, [facility], social history reviewed and updated.    Review of Systems  Constitutional: Negative.   HENT: Negative.   Eyes: Negative.   Respiratory: Negative for cough, chest tightness and shortness of breath.   Cardiovascular: Negative for chest pain, palpitations and leg swelling.  Gastrointestinal: Negative for abdominal distention, abdominal pain, constipation, diarrhea, nausea and vomiting.  Musculoskeletal: Negative.   Skin: Negative.   Neurological: Negative.   Psychiatric/Behavioral: Negative.     Objective:  Physical Exam Constitutional:      Appearance: He is well-developed.  HENT:     Head: Normocephalic and atraumatic.  Cardiovascular:     Rate and Rhythm: Normal rate and regular rhythm.  Pulmonary:     Effort: Pulmonary effort is normal. No respiratory distress.     Breath sounds: Normal breath sounds. No wheezing or rales.  Abdominal:     General: Bowel sounds are normal. There is no distension.     Palpations: Abdomen is soft.     Tenderness: There is no abdominal tenderness. There is no rebound.  Musculoskeletal:     Cervical back: Normal range of motion.  Skin:    General: Skin is warm and dry.  Neurological:     Mental Status: He is alert and oriented to person, place, and time.     Coordination: Coordination normal.     Vitals:   03/22/19 1039 03/22/19 1121  BP: (!) 150/100  (!) 130/100  Pulse: 67   Temp: (!) 97.4 F (36.3 C)   TempSrc: Axillary   SpO2: 99%   Weight: 149 lb (67.6 kg)   Height: 5\' 9"  (1.753 m)    EKG: Rate 60, axis normal, interval normal, no st or t wave change, LVH,  when compared to 2016  This visit occurred during the SARS-CoV-2 public health emergency.  Safety protocols were in place, including screening questions prior to the visit, additional usage of staff PPE, and extensive cleaning of exam room while observing appropriate contact time as indicated for disinfecting solutions.   Assessment & Plan:  Flu and Tdap given at visit

## 2019-04-02 ENCOUNTER — Telehealth: Payer: Self-pay

## 2019-04-02 NOTE — Telephone Encounter (Signed)
Jetta from [facility] called wanting clarification on pt's medications. She stated that she talked to pt and his wife and they told her that he is not taking Plavix and has not taken in for a while now. With his procedure scheduled for 04/20/19 [facility] would like to know if it is ok to proceed with the procedure as schedule. If so, could a letter be faxed them stating that you are aware he is not on plavix but ok for procedure with an updated med list attached. Please advise.   [phone] fax

## 2019-04-03 NOTE — Telephone Encounter (Signed)
Henry Carter from [facility] has been informed.

## 2019-04-03 NOTE — Telephone Encounter (Signed)
I was aware he was not taking at visit 03/22/19 and asked him to start back taking this. Hold 7 days prior to procedure. I would strongly recommend he resume after procedure as this medication helps to prevent stroke. He was given refills on 03/22/19 and needs to pick that up. This should still be on his current medication list and he needs to take it.

## 2019-05-11 ENCOUNTER — Ambulatory Visit (HOSPITAL_COMMUNITY): Admission: RE | Admit: 2019-05-11 | Payer: Medicare Other | Source: Ambulatory Visit

## 2019-05-11 ENCOUNTER — Emergency Department (HOSPITAL_COMMUNITY)
Admission: EM | Admit: 2019-05-11 | Discharge: 2019-05-11 | Disposition: A | Discharge status: Home / Self Care | Payer: Medicare Other | Attending: Emergency Medicine | Admitting: Emergency Medicine

## 2019-05-11 ENCOUNTER — Encounter: Payer: Self-pay | Admitting: Internal Medicine

## 2019-05-11 ENCOUNTER — Ambulatory Visit (INDEPENDENT_AMBULATORY_CARE_PROVIDER_SITE_OTHER): Payer: Medicare Other | Admitting: Internal Medicine

## 2019-05-11 ENCOUNTER — Encounter (HOSPITAL_COMMUNITY): Payer: Self-pay | Admitting: Emergency Medicine

## 2019-05-11 ENCOUNTER — Other Ambulatory Visit: Payer: Self-pay

## 2019-05-11 ENCOUNTER — Emergency Department (HOSPITAL_BASED_OUTPATIENT_CLINIC_OR_DEPARTMENT_OTHER): Payer: Medicare Other

## 2019-05-11 VITALS — BP 142/98 | HR 106 | Temp 98.0°F | Ht 69.0 in | Wt 141.0 lb

## 2019-05-11 DIAGNOSIS — M7989 Other specified soft tissue disorders: Secondary | ICD-10-CM

## 2019-05-11 DIAGNOSIS — I10 Essential (primary) hypertension: Secondary | ICD-10-CM

## 2019-05-11 DIAGNOSIS — Z9114 Patient's other noncompliance with medication regimen: Secondary | ICD-10-CM

## 2019-05-11 DIAGNOSIS — Z87891 Personal history of nicotine dependence: Secondary | ICD-10-CM | POA: Insufficient documentation

## 2019-05-11 DIAGNOSIS — N401 Enlarged prostate with lower urinary tract symptoms: Secondary | ICD-10-CM | POA: Diagnosis not present

## 2019-05-11 DIAGNOSIS — I70213 Atherosclerosis of native arteries of extremities with intermittent claudication, bilateral legs: Secondary | ICD-10-CM | POA: Diagnosis not present

## 2019-05-11 DIAGNOSIS — J449 Chronic obstructive pulmonary disease, unspecified: Secondary | ICD-10-CM | POA: Insufficient documentation

## 2019-05-11 DIAGNOSIS — I82412 Acute embolism and thrombosis of left femoral vein: Secondary | ICD-10-CM | POA: Insufficient documentation

## 2019-05-11 DIAGNOSIS — R351 Nocturia: Secondary | ICD-10-CM

## 2019-05-11 LAB — COMPREHENSIVE METABOLIC PANEL
ALT: 36 U/L (ref 0–53)
ALT: 37 U/L (ref 0–44)
AST: 30 U/L (ref 0–37)
AST: 35 U/L (ref 15–41)
Albumin: 3.8 g/dL (ref 3.5–5.2)
Albumin: 2.9 g/dL — ABNORMAL LOW (ref 3.5–5.0)
Alkaline Phosphatase: 120 U/L — ABNORMAL HIGH (ref 39–117)
Alkaline Phosphatase: 102 U/L (ref 38–126)
Anion gap: 9 (ref 5–15)
BUN: 16 mg/dL (ref 6–23)
BUN: 18 mg/dL (ref 8–23)
CO2: 27 mEq/L (ref 19–32)
CO2: 25 mmol/L (ref 22–32)
Calcium: 10 mg/dL (ref 8.4–10.5)
Calcium: 9.3 mg/dL (ref 8.9–10.3)
Chloride: 100 mEq/L (ref 96–112)
Chloride: 105 mmol/L (ref 98–111)
Creatinine, Ser: 0.93 mg/dL (ref 0.40–1.50)
Creatinine, Ser: 1.11 mg/dL (ref 0.61–1.24)
GFR calc Af Amer: >60 mL/min (ref >60)
GFR calc non Af Amer: >60 mL/min (ref >60)
GFR: 95.11 mL/min (ref >60.00)
Glucose, Bld: 103 mg/dL — ABNORMAL HIGH (ref 70–99)
Glucose, Bld: 115 mg/dL — ABNORMAL HIGH (ref 70–99)
Potassium: 4.3 mEq/L (ref 3.5–5.1)
Potassium: 4.4 mmol/L (ref 3.5–5.1)
Sodium: 135 mEq/L (ref 135–145)
Sodium: 139 mmol/L (ref 135–145)
Total Bilirubin: 0.5 mg/dL (ref 0.2–1.2)
Total Bilirubin: 0.3 mg/dL (ref 0.3–1.2)
Total Protein: 7.7 g/dL (ref 6.0–8.3)
Total Protein: 6.7 g/dL (ref 6.5–8.1)

## 2019-05-11 LAB — CBC
HCT: 33 % — ABNORMAL LOW (ref 39.0–52.0)
Hemoglobin: 11 g/dL — ABNORMAL LOW (ref 13.0–17.0)
MCHC: 33.4 g/dL (ref 30.0–36.0)
MCV: 89.3 fl (ref 78.0–100.0)
Platelets: 494 10*3/uL — ABNORMAL HIGH (ref 150.0–400.0)
RBC: 3.7 Mil/uL — ABNORMAL LOW (ref 4.22–5.81)
RDW: 15.4 % (ref 11.5–15.5)
WBC: 6 10*3/uL (ref 4.0–10.5)

## 2019-05-11 LAB — CBC WITH DIFFERENTIAL/PLATELET
Abs Immature Granulocytes: 0.02 10*3/uL (ref 0.00–0.07)
Basophils Absolute: 0 10*3/uL (ref 0.0–0.1)
Basophils Relative: 1 %
Eosinophils Absolute: 0.1 10*3/uL (ref 0.0–0.5)
Eosinophils Relative: 1 %
HCT: 33 % — ABNORMAL LOW (ref 39.0–52.0)
Hemoglobin: 10.3 g/dL — ABNORMAL LOW (ref 13.0–17.0)
Immature Granulocytes: 0 %
Lymphocytes Relative: 14 %
Lymphs Abs: 0.8 10*3/uL (ref 0.7–4.0)
MCH: 28.9 pg (ref 26.0–34.0)
MCHC: 31.2 g/dL (ref 30.0–36.0)
MCV: 92.7 fL (ref 80.0–100.0)
Monocytes Absolute: 0.5 10*3/uL (ref 0.1–1.0)
Monocytes Relative: 9 %
Neutro Abs: 4.4 10*3/uL (ref 1.7–7.7)
Neutrophils Relative %: 75 %
Platelets: 465 10*3/uL — ABNORMAL HIGH (ref 150–400)
RBC: 3.56 MIL/uL — ABNORMAL LOW (ref 4.22–5.81)
RDW: 14.7 % (ref 11.5–15.5)
WBC: 5.9 10*3/uL (ref 4.0–10.5)
nRBC: 0 % (ref 0.0–0.2)

## 2019-05-11 MED ORDER — RIVAROXABAN 15 MG PO TABS
15.0000 mg | ORAL_TABLET | Freq: Two times a day (BID) | ORAL | Status: DC
  Administered 2019-05-11: 20:00:00 15 mg via ORAL
  Filled 2019-05-11 (×2): qty 1

## 2019-05-11 MED ORDER — RIVAROXABAN (XARELTO) VTE STARTER PACK (15 & 20 MG): ORAL_TABLET | ORAL | 0 refills | Status: DC

## 2019-05-11 NOTE — ED Provider Notes (Signed)
[facility] EMERGENCY DEPARTMENT Provider Note   CSN: [id number] Arrival date & time: 05/11/19  1501     History Chief Complaint  Patient presents with  . Leg Swelling    Henry Carter is a 79 y.o. male with history of peripheral vascular disease presenting to emergency department left leg swelling for 3 days.  The patient says he called his primary care doctor was told to come to the ED for possible DVT evaluation.  He denies is ever had a blood clot before.  Reports swelling in his lower leg mostly around his left ankle.  He denies any falls or trauma.  Denies any pain in his leg.  He denies any shortness of breath or chest pain.  Hx of indwelling foley, wears this around left leg with bladder bag and elastic strap  See Dr Nathanial Millman office note from today  On plavix  HPI     Past Medical History:  Diagnosis Date  .  iron deficiency 01/18/2013  . Anemia   . Angioedema   . BPH (benign prostatic hyperplasia)   . COPD (chronic obstructive pulmonary disease) ([place])    STates he has copd  . Essential hypertension   . GERD (gastroesophageal reflux disease)   . Helicobacter pylori gastritis 10/10/2012  . Hyperlipidemia   . IBS (irritable bowel syndrome)   . Impotence   . Peripheral vascular disease, unspecified ([place])   . Personal history of colonic adenoma 09/22/2012  . Personal history of tobacco use, presenting hazards to health     Patient Active Problem List   Diagnosis Date Noted  . Left leg swelling 05/11/2019  . Pre-operative cardiovascular examination 03/22/2019  . 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[place]) 07/11/2018  . Right arm weakness 07/21/2017  . Non compliance w medication regimen 07/30/2016  . 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 Mild cognitive impairment 12/11/2015  . 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 Healthcare maintenance 02/07/2013  . Iron deficiency anemia due to chronic blood loss 01/18/2013  . C O P D  04/08/2010  . Atherosclerosis of native artery of extremity with intermittent claudication ([place]) 11/26/2008  . Benign prostatic hyperplasia 10/20/2007  . PERIPHERAL VASCULAR DISEASE 05/19/2007  . Essential hypertension 02/01/2007  . HYPERLIPIDEMIA 01/03/2007    Past Surgical History:  Procedure Laterality Date  . abdominal surg for ulcers'  1980  . COLONOSCOPY N/A 09/22/2012   Procedure: COLONOSCOPY;  Surgeon: Gatha Mayer, MD;  Location: [facility];  Service: Endoscopy;  Laterality: N/A;  . distal aortogram    . percutaneous transluminal angionplasty with placement of 2 self expanding stent in the distal left superfical femeral artery         Family History  Problem Relation Age of Onset  . Alcohol abuse Father   . Kidney disease Brother   . Drug abuse Brother   . Diabetes Brother   . Diabetes Mother   . Diabetes Brother   . Colon cancer Neg Hx     Social History   Tobacco Use  . Smoking status: Former Smoker    Packs/day: 0.25    Years: 50.00    Pack years: 12.50    Types: Cigarettes    Start date: 04/12/1957    Quit date: 04/12/2014    Years since quitting: 5.0  . Smokeless tobacco: Never Used  . Tobacco comment: using chatix; stopped now; took x 1 week / had stopped 3 years prior to starting back in 2005  Substance Use  Topics  . Alcohol use: No    Alcohol/week: 0.0 standard drinks    Comment: OCC  . Drug use: Yes    Comment: States marijuanna relaxes him;     Home Medications Prior to Admission medications   Medication Sig Start Date End Date Taking? Authorizing Provider  clopidogrel (PLAVIX) 75 MG tablet Take 1 tablet (75 mg total) by mouth daily. Patient not taking: Reported on 05/11/2019 03/22/19   [name], MD  simvastatin (ZOCOR) 20 MG tablet Take 1 tablet (20 mg total) by mouth at bedtime. Patient not taking: Reported on 03/22/2019 07/13/18   [name], MD    Allergies    Ace inhibitors and Penicillins  Review of Systems     Review of Systems  Constitutional: Negative for chills and fever.  Respiratory: Negative for cough and shortness of breath.   Cardiovascular: Negative for chest pain and palpitations.  Gastrointestinal: Negative for abdominal pain and vomiting.  Musculoskeletal: Positive for myalgias. Negative for arthralgias.  Skin: Negative for rash and wound.  Neurological: Negative for syncope and weakness.  Psychiatric/Behavioral: Negative for agitation and confusion.  All other systems reviewed and are negative.   Physical Exam Updated Vital Signs BP (!) 165/92   Pulse 72   Temp 98.5 F (36.9 C) (Oral)   Resp 17   SpO2 99%   Physical Exam Vitals and nursing note reviewed.  Constitutional:      Appearance: He is well-developed.  HENT:     Head: Normocephalic and atraumatic.  Eyes:     Conjunctiva/sclera: Conjunctivae normal.  Cardiovascular:     Rate and Rhythm: Normal rate and regular rhythm.     Pulses: Normal pulses.     Comments: Doppler pulse audible pedal in both feet Pulmonary:     Effort: Pulmonary effort is normal. No respiratory distress.  Musculoskeletal:     Cervical back: Neck supple.     Comments: LLE edema around ankle without focal malleolar ttp or midfoot ttp No posterior calf ttp Left leg strap for bladder bag compressing leg  Skin:    General: Skin is warm and dry.  Neurological:     General: No focal deficit present.     Mental Status: He is alert and oriented to person, place, and time.     ED Results / Procedures / Treatments   Labs (all labs ordered are listed, but only abnormal results are displayed) Labs Reviewed  COMPREHENSIVE METABOLIC PANEL - Abnormal; Notable for the following components:      Result Value   Glucose, Bld 115 (*)    Albumin 2.9 (*)    All other components within normal limits  CBC WITH DIFFERENTIAL/PLATELET - Abnormal; Notable for the following components:   RBC 3.56 (*)    Hemoglobin 10.3 (*)    HCT 33.0 (*)    Platelets  465 (*)    All other components within normal limits    EKG None  Radiology VAS [place] LOWER EXTREMITY VENOUS (DVT) (ONLY [facility] & [facility] 7a-7p)  Result Date: 05/11/2019  Lower Venous Study Indications: Swelling.  Limitations: Body habitus, posterior acoustic shadowing secondary to peripheral vascular disease, colostomy. Comparison Study: No prior study. Performing Technologist: Maudry Mayhew MHA, RDMS, RVT, RDCS  Examination Guidelines: A complete evaluation includes B-mode imaging, spectral Doppler, color Doppler, and power Doppler as needed of all accessible portions of each vessel. Bilateral testing is considered an integral part of a complete examination. Limited examinations for reoccurring indications may be performed as  noted.  +-----+---------------+---------+-----------+----------+--------------+ RIGHTCompressibilityPhasicitySpontaneityPropertiesThrombus Aging +-----+---------------+---------+-----------+----------+--------------+ CFV  Full           Yes      Yes                                 +-----+---------------+---------+-----------+----------+--------------+   +---------+---------------+---------+-----------+----------+--------------+ LEFT     CompressibilityPhasicitySpontaneityPropertiesThrombus Aging +---------+---------------+---------+-----------+----------+--------------+ CFV      None                    No                   Acute          +---------+---------------+---------+-----------+----------+--------------+ FV Prox  None                    No                   Acute          +---------+---------------+---------+-----------+----------+--------------+ FV Mid                           No                   Acute          +---------+---------------+---------+-----------+----------+--------------+ FV DistalNone                                         Acute          +---------+---------------+---------+-----------+----------+--------------+  POP      None                    No                   Acute          +---------+---------------+---------+-----------+----------+--------------+ PTV      None                    No                   Acute          +---------+---------------+---------+-----------+----------+--------------+ PERO     None                    No                   Acute          +---------+---------------+---------+-----------+----------+--------------+   Left Technical Findings: Not visualized segments include saphenofemoral junction, profunda femoral vein,.   Summary: Right: No evidence of common femoral vein obstruction. Left: Findings consistent with acute deep vein thrombosis involving the left common femoral vein, left femoral vein, left popliteal vein, left posterior tibial veins, and left peroneal veins. No cystic structure found in the popliteal fossa.  Unable to visualize iliac veins or IVC due to technical limitations listed above. *See table(s) above for measurements and observations.    Preliminary     Procedures Procedures (including critical care time)  Medications Ordered in ED Medications  Rivaroxaban (XARELTO) tablet 15 mg (has no administration in time range)    ED Course  I have reviewed the triage vital signs and the nursing notes.  Pertinent labs & imaging results that were available during my  care of the patient were reviewed by me and considered in my medical decision making (see chart for details).  79 yo male here with left leg swelling x 3 days.  Sent in for DVT evaluation by PCP.  Patient not having pain in leg.  No reported falls or trauma.  No signs or symptoms of PE on my exam  Labs ordered at intake, will f/u Plan for DVT ultrasound  Suspect there may be a component of compression edema as he is wearing his bladder bag around his left leg with a tight strap, this may need to be loosened.    Final Clinical Impression(s) / ED Diagnoses Final diagnoses:  Left  leg swelling  Acute deep vein thrombosis (DVT) of femoral vein of left lower extremity [facility])    Rx / DC Orders ED Discharge Orders    None       Wyvonnia Dusky, MD 05/11/19 [phone]

## 2019-05-11 NOTE — ED Triage Notes (Signed)
Pt has left leg swelling from mid thigh down into feet that started 4 days ago.

## 2019-05-11 NOTE — Assessment & Plan Note (Signed)
Severe new problem which is concerning for DVT. We talked about risk of PE if this is DVT. Ordered stat US to rule out DVT which will be done today. If positive needs to start on NOAC.

## 2019-05-11 NOTE — Assessment & Plan Note (Signed)
BP is very elevated and he has not resumed any of his medications as advised at prior visit.

## 2019-05-11 NOTE — Discharge Instructions (Addendum)
You are seen in the ER for leg swelling. Ultrasound is showing that you have a blood clot that is fairly extensive.  We are starting you on a blood thinner.  Please read the instructions provided on the blood thinner and prevent any falls.  If you start having any bleeding or trauma to your head, come to the ER immediately.  Please see your own doctor in 5 to 7 days. You will also receive a phone from [facility] to see you in the clinic in a couple of weeks to ensure you are getting better.  Information on my medicine - XARELTO (rivaroxaban)  WHY WAS XARELTO PRESCRIBED FOR YOU? Xarelto was prescribed to treat blood clots that may have been found in the veins of your legs (deep vein thrombosis) or in your lungs (pulmonary embolism) and to reduce the risk of them occurring again.  What do you need to know about Xarelto? The starting dose is one 15 mg tablet taken TWICE daily with food for the FIRST 21 DAYS then on (enter date)  ***  the dose is changed to one 20 mg tablet taken ONCE A DAY with your evening meal.  DO NOT stop taking Xarelto without talking to the health care provider who prescribed the medication.  Refill your prescription for 20 mg tablets before you run out.  After discharge, you should have regular check-up appointments with your healthcare provider that is prescribing your Xarelto.  In the future your dose may need to be changed if your kidney function changes by a significant amount.  What do you do if you miss a dose? If you are taking Xarelto TWICE DAILY and you miss a dose, take it as soon as you remember. You may take two 15 mg tablets (total 30 mg) at the same time then resume your regularly scheduled 15 mg twice daily the next day.  If you are taking Xarelto ONCE DAILY and you miss a dose, take it as soon as you remember on the same day then continue your regularly scheduled once daily regimen the next day. Do not take two doses of Xarelto  at the same time.   Important Safety Information Xarelto is a blood thinner medicine that can cause bleeding. You should call your healthcare provider right away if you experience any of the following: ? Bleeding from an injury or your nose that does not stop. ? Unusual colored urine (red or dark brown) or unusual colored stools (red or black). ? Unusual bruising for unknown reasons. ? A serious fall or if you hit your head (even if there is no bleeding).  Some medicines may interact with Xarelto and might increase your risk of bleeding while on Xarelto. To help avoid this, consult your healthcare provider or pharmacist prior to using any new prescription or non-prescription medications, including herbals, vitamins, non-steroidal anti-inflammatory drugs (NSAIDs) and supplements.  This website has more information on Xarelto: [email or web address]-benson.[email or web address]

## 2019-05-11 NOTE — Assessment & Plan Note (Signed)
BP is very high today. He is not taking his blood pressure medication as prescribed. He has no desire to start any medications today despite counseling that he has past stroke and is at high risk for recurrence with uncontrolled BP.

## 2019-05-11 NOTE — Progress Notes (Signed)
Left lower extremity venous duplex completed. Refer to "CV Proc" under chart review to view preliminary results.  Critical results discussed with Dr. Kathrynn Humble.  05/11/2019 6:09 PM Kelby Aline., MHA, RVT, RDCS, RDMS

## 2019-05-11 NOTE — Progress Notes (Signed)
ANTICOAGULATION CONSULT NOTE - Initial Consult  Pharmacy Consult for Xarelto Indication: DVT  Allergies  Allergen Reactions  . Ace Inhibitors     REACTION: antioedema  . Penicillins Hives    Has patient had a PCN reaction causing immediate rash, facial/tongue/throat swelling, SOB or lightheadedness with hypotension: No Has patient had a PCN reaction causing severe rash involving mucus membranes or skin necrosis: No Has patient had a PCN reaction that required hospitalization yes Has patient had a PCN reaction occurring within the last 10 years: no If all of the above answers are "NO", then may proceed with Cephalosporin use.    Vital Signs: Temp: 98.5 F (36.9 C) (01/29 1506) Temp Source: Oral (01/29 1506) BP: 165/92 (01/29 1815) Pulse Rate: 72 (01/29 1815)  Labs: Recent Labs    05/11/19 1038 05/11/19 1513  HGB 11.0* 10.3*  HCT 33.0* 33.0*  PLT 494.0* 465*  CREATININE 0.93 1.11    Estimated Creatinine Clearance: 50.5 mL/min (by C-G formula based on SCr of 1.11 mg/dL).   Medical History: Past Medical History:  Diagnosis Date  .  iron deficiency 01/18/2013  . Anemia   . Angioedema   . BPH (benign prostatic hyperplasia)   . COPD (chronic obstructive pulmonary disease) ([place])    STates he has copd  . Essential hypertension   . GERD (gastroesophageal reflux disease)   . Helicobacter pylori gastritis 10/10/2012  . Hyperlipidemia   . IBS (irritable bowel syndrome)   . Impotence   . Peripheral vascular disease, unspecified ([place])   . Personal history of colonic adenoma 09/22/2012  . Personal history of tobacco use, presenting hazards to health     Assessment: 79 y.o. male presenting with left ankle and thigh swelling. Confirmed DVT in the left lower leg. Patient started on Xarelto for DVT treatment. Hbg is low at 10.3 and platelets are 465. Patient is on clopidogrel for history of stroke.Educated both patient and spouse about Xarelto; both demonstrated understand.    Goal of Therapy:  Monitor platelets by anticoagulation protocol: Yes   Plan:  Xarelto 15 mg PO BID x21 days; On day 22 start Xarelto 20 mg PO daily  Monitor CBC and signs and symptoms of bleeding  Acey Lav, PharmD  PGY1 Acute Care Pharmacy Resident 05/11/2019,7:38 PM

## 2019-05-11 NOTE — ED Provider Notes (Signed)
  Physical Exam  BP (!) 170/89   Pulse 79   Temp 98.5 F (36.9 C) (Oral)   Resp 17   SpO2 98%   Physical Exam  ED Course/Procedures     Procedures  MDM   Assuming care of patient from Dr. Langston Masker.   Patient in the ED for leg pain. Workup thus far shows normal labs.  Concerning findings are as following none Important pending results are : US DVT.  According to Dr. Langston Masker, plan is to d/c if the [place] s neg. No concerns for cellulitis.   Patient had no complains, no concerns from the nursing side. Will continue to monitor.        Varney Biles, MD 05/11/19 [phone]

## 2019-05-11 NOTE — Assessment & Plan Note (Signed)
Good pulses on exam left leg making critical limb ischemia unlikely. He is not taking his plavix and advised him to resume this.

## 2019-05-11 NOTE — Assessment & Plan Note (Signed)
Awaiting urological procedure and indwelling catheter, does not appear to be related to left leg swelling and pain.

## 2019-05-11 NOTE — Progress Notes (Signed)
   Subjective:   Patient ID: Henry Carter, male    DOB: 09/14/1940, 79 y.o.   MRN: [id number]  HPI The patient is a 79 YO man coming in for left ankle and thigh swelling. Was scheduled procedure urology 04/20/19 and he did not get this. This started 1-2 weeks ago with pain and swelling. Prior vascular stenting in the left femoral area. Denies redness on the skin. Denies fevers or chills. Is not taking any of his medications at all currently. Still using catheter for urination. Denies coldness or numbness in the leg. Does not feel like past stroke symptoms. Denies weakness in the leg new. Denies SOB or chest pains or pain with breathing.   Review of Systems  Constitutional: Positive for activity change.  HENT: Negative.   Eyes: Negative.   Respiratory: Negative for cough, chest tightness and shortness of breath.   Cardiovascular: Positive for leg swelling. Negative for chest pain and palpitations.  Gastrointestinal: Negative for abdominal distention, abdominal pain, constipation, diarrhea, nausea and vomiting.  Musculoskeletal: Positive for arthralgias and myalgias.  Skin: Negative.   Neurological: Negative.   Psychiatric/Behavioral: Negative.     Objective:  Physical Exam Constitutional:      Appearance: He is well-developed.  HENT:     Head: Normocephalic and atraumatic.  Cardiovascular:     Rate and Rhythm: Normal rate and regular rhythm.  Pulmonary:     Effort: Pulmonary effort is normal. No respiratory distress.     Breath sounds: Normal breath sounds. No wheezing or rales.  Abdominal:     General: Bowel sounds are normal. There is no distension.     Palpations: Abdomen is soft.     Tenderness: There is no abdominal tenderness. There is no rebound.  Musculoskeletal:        General: Swelling and tenderness present.     Cervical back: Normal range of motion.     Comments: Left leg with significant swelling from thigh to ankle, right leg without any edema. Pain diffuse in the  left leg which is worse with palpation, skin warm to touch and palpable pulse left PT and DP, urine bag attached at the left thigh region but not cutting off circulation  Skin:    General: Skin is warm and dry.  Neurological:     Mental Status: He is alert and oriented to person, place, and time.     Coordination: Coordination normal.     Vitals:   05/11/19 1006  BP: (!) 142/98  Pulse: (!) 106  Temp: 98 F (36.7 C)  TempSrc: Oral  SpO2: 97%  Weight: 141 lb (64 kg)  Height: 5\' 9"  (1.753 m)    This visit occurred during the SARS-CoV-2 public health emergency.  Safety protocols were in place, including screening questions prior to the visit, additional usage of staff PPE, and extensive cleaning of exam room while observing appropriate contact time as indicated for disinfecting solutions.   Assessment & Plan:  Visit time 20 minutes in face to face communication with patient and coordination of care, additional 20 minutes spent in record review, coordination or care, ordering tests, communicating/referring to other healthcare professionals, documenting in medical records all on the same day of the visit for total time 40 minutes spent on the visit.

## 2019-05-11 NOTE — Patient Instructions (Signed)
We are doing labs today and need to get an ultrasound of the leg to make sure this is not a blood clot.

## 2019-05-18 ENCOUNTER — Other Ambulatory Visit: Payer: Self-pay

## 2019-05-18 ENCOUNTER — Ambulatory Visit (INDEPENDENT_AMBULATORY_CARE_PROVIDER_SITE_OTHER): Payer: Medicare Other | Admitting: Internal Medicine

## 2019-05-18 ENCOUNTER — Encounter: Payer: Self-pay | Admitting: Internal Medicine

## 2019-05-18 VITALS — BP 124/82 | HR 83 | Temp 98.0°F | Ht 69.0 in | Wt 142.0 lb

## 2019-05-18 DIAGNOSIS — I82402 Acute embolism and thrombosis of unspecified deep veins of left lower extremity: Secondary | ICD-10-CM

## 2019-05-18 MED ORDER — RIVAROXABAN 20 MG PO TABS: 20.0000 mg | ORAL_TABLET | Freq: Every day | ORAL | 0 refills | Status: DC

## 2019-05-18 NOTE — Assessment & Plan Note (Signed)
In multiple veins and throughout whole course of the leg. With swelling. Given samples of xarelto 15 mg to take BID for 3 weeks and 2 weeks of 20 mg xarelto to take daily once done with 3 weeks of 15 mg BID. He and his wife were given instructions on how to take and were able to repeat this information back. Written instructions were given as well. Rx sent to pharmacy for 2 months xarelto 20 mg daily to start taking when running out of samples. Explained about the serious nature of this and the risk of further blood clots if he fails to take his blood thinner. He has upcoming visit with urology and will ask them to assess the urine bag as there is some concern this may be too tight. If he is to have procedure and needs to stop anticoagulation will need to be deferred at least 3 months.

## 2019-05-18 NOTE — Progress Notes (Signed)
   Subjective:   Patient ID: Henry Carter, male    DOB: 26-Feb-1941, 79 y.o.   MRN: [id number]  HPI The patient is a 79 YO man coming in for ER follow up (in for left leg swelling, found to have DVT, started on xarelto which he has not started). Overall he is still having pain in the leg and swelling. Has compression stockings at home and wonders if he can try that. States pharmacy did not have prescription. He is taking plavix at this time and maybe one other medicine. Not sure what they are. Denies chest pains or SOB. Denies nausea or vomiting or diarrhea. No new rash on the skin. Is worried about the fact that this might delay urological procedure.   PMH, [facility], social history reviewed and updated.   Review of Systems  Constitutional: Negative.   HENT: Negative.   Eyes: Negative.   Respiratory: Negative for cough, chest tightness and shortness of breath.   Cardiovascular: Positive for leg swelling. Negative for chest pain and palpitations.  Gastrointestinal: Negative for abdominal distention, abdominal pain, constipation, diarrhea, nausea and vomiting.  Musculoskeletal: Positive for arthralgias and myalgias.  Skin: Negative.   Neurological: Negative.   Psychiatric/Behavioral: Negative.     Objective:  Physical Exam Constitutional:      Appearance: He is well-developed.  HENT:     Head: Normocephalic and atraumatic.  Cardiovascular:     Rate and Rhythm: Normal rate and regular rhythm.  Pulmonary:     Effort: Pulmonary effort is normal. No respiratory distress.     Breath sounds: Normal breath sounds. No wheezing or rales.  Abdominal:     General: Bowel sounds are normal. There is no distension.     Palpations: Abdomen is soft.     Tenderness: There is no abdominal tenderness. There is no rebound.  Musculoskeletal:        General: Tenderness present.     Cervical back: Normal range of motion.     Left lower leg: Edema present.     Comments: Left leg swollen from the ankles  to the thighs, no skin rash present  Skin:    General: Skin is warm and dry.  Neurological:     Mental Status: He is alert and oriented to person, place, and time.     Coordination: Coordination abnormal.     Comments: Uses cane     Vitals:   05/18/19 1002  BP: 124/82  Pulse: 83  Temp: 98 F (36.7 C)  TempSrc: Oral  SpO2: 97%  Weight: 142 lb (64.4 kg)  Height: 5\' 9"  (1.753 m)    This visit occurred during the SARS-CoV-2 public health emergency.  Safety protocols were in place, including screening questions prior to the visit, additional usage of staff PPE, and extensive cleaning of exam room while observing appropriate contact time as indicated for disinfecting solutions.   Assessment & Plan:  Visit time 20 minutes in face to face communication with patient and coordination of care, additional 10 minutes spent in record review, coordination or care, ordering tests, communicating/referring to other healthcare professionals, documenting in medical records all on the same day of the visit for total time 30 minutes spent on the visit.

## 2019-05-18 NOTE — Patient Instructions (Addendum)
February 19th is when you can start using the compression stockings.   We have sent in the blood thinning medicine to use for the next 3 months.   For the xarelto take 1 pill 15 mg twice a day for the first 3 weeks. This is the grey top bottles we have given you. This should be the right amount so when this is gone siwtch to the white top bottles. These are 1 pill daily 20 mg and continue on 1 pill daily for 3 months.   We have given you 2 weeks of the 20 mg pills so you will need to fill that at the pharmacy which we have sent in today. You will need to take 2 months from the pharmacy and we have sent that in.  Come back in 2 months so we can check in.   If for some reason you have a problem with taking or getting your medicine let us know as it is very important to start taking this.

## 2019-05-20 ENCOUNTER — Encounter (HOSPITAL_COMMUNITY): Payer: Self-pay | Admitting: Emergency Medicine

## 2019-05-20 ENCOUNTER — Emergency Department (HOSPITAL_COMMUNITY)
Admission: EM | Admit: 2019-05-20 | Discharge: 2019-05-20 | Disposition: A | Discharge status: Home / Self Care | Payer: Medicare HMO | Attending: Emergency Medicine | Admitting: Emergency Medicine

## 2019-05-20 DIAGNOSIS — I10 Essential (primary) hypertension: Secondary | ICD-10-CM | POA: Insufficient documentation

## 2019-05-20 DIAGNOSIS — Z87891 Personal history of nicotine dependence: Secondary | ICD-10-CM | POA: Insufficient documentation

## 2019-05-20 DIAGNOSIS — Z7901 Long term (current) use of anticoagulants: Secondary | ICD-10-CM | POA: Insufficient documentation

## 2019-05-20 DIAGNOSIS — Z79899 Other long term (current) drug therapy: Secondary | ICD-10-CM | POA: Insufficient documentation

## 2019-05-20 DIAGNOSIS — R300 Dysuria: Secondary | ICD-10-CM

## 2019-05-20 DIAGNOSIS — Z466 Encounter for fitting and adjustment of urinary device: Secondary | ICD-10-CM | POA: Diagnosis not present

## 2019-05-20 DIAGNOSIS — N3001 Acute cystitis with hematuria: Secondary | ICD-10-CM | POA: Diagnosis not present

## 2019-05-20 DIAGNOSIS — J449 Chronic obstructive pulmonary disease, unspecified: Secondary | ICD-10-CM | POA: Diagnosis not present

## 2019-05-20 LAB — URINALYSIS, ROUTINE W REFLEX MICROSCOPIC
Bilirubin Urine: NEGATIVE
Bilirubin Urine: NEGATIVE
Glucose, UA: NEGATIVE mg/dL
Glucose, UA: NEGATIVE mg/dL
Ketones, ur: 5 mg/dL — AB
Ketones, ur: NEGATIVE mg/dL
Leukocytes,Ua: NEGATIVE
Nitrite: NEGATIVE
Nitrite: NEGATIVE
Protein, ur: >=300 mg/dL — AB
Protein, ur: 100 mg/dL — AB
RBC / HPF: >50 RBC/hpf — ABNORMAL HIGH (ref 0–5)
RBC / HPF: >50 RBC/hpf — ABNORMAL HIGH (ref 0–5)
Specific Gravity, Urine: 1.02 (ref 1.005–1.030)
Specific Gravity, Urine: 1.023 (ref 1.005–1.030)
WBC, UA: >50 WBC/hpf — ABNORMAL HIGH (ref 0–5)
pH: 9 — ABNORMAL HIGH (ref 5.0–8.0)
pH: 6 (ref 5.0–8.0)

## 2019-05-20 LAB — CBC WITH DIFFERENTIAL/PLATELET
Abs Immature Granulocytes: 0.02 10*3/uL (ref 0.00–0.07)
Basophils Absolute: 0 10*3/uL (ref 0.0–0.1)
Basophils Relative: 1 %
Eosinophils Absolute: 0.1 10*3/uL (ref 0.0–0.5)
Eosinophils Relative: 3 %
HCT: 31.9 % — ABNORMAL LOW (ref 39.0–52.0)
Hemoglobin: 10.1 g/dL — ABNORMAL LOW (ref 13.0–17.0)
Immature Granulocytes: 1 %
Lymphocytes Relative: 18 %
Lymphs Abs: 0.8 10*3/uL (ref 0.7–4.0)
MCH: 28.9 pg (ref 26.0–34.0)
MCHC: 31.7 g/dL (ref 30.0–36.0)
MCV: 91.4 fL (ref 80.0–100.0)
Monocytes Absolute: 0.4 10*3/uL (ref 0.1–1.0)
Monocytes Relative: 10 %
Neutro Abs: 2.9 10*3/uL (ref 1.7–7.7)
Neutrophils Relative %: 67 %
Platelets: 337 10*3/uL (ref 150–400)
RBC: 3.49 MIL/uL — ABNORMAL LOW (ref 4.22–5.81)
RDW: 15 % (ref 11.5–15.5)
WBC: 4.2 10*3/uL (ref 4.0–10.5)
nRBC: 0 % (ref 0.0–0.2)

## 2019-05-20 LAB — BASIC METABOLIC PANEL
Anion gap: 7 (ref 5–15)
BUN: 18 mg/dL (ref 8–23)
CO2: 27 mmol/L (ref 22–32)
Calcium: 9.5 mg/dL (ref 8.9–10.3)
Chloride: 102 mmol/L (ref 98–111)
Creatinine, Ser: 1.06 mg/dL (ref 0.61–1.24)
GFR calc Af Amer: >60 mL/min (ref >60)
GFR calc non Af Amer: >60 mL/min (ref >60)
Glucose, Bld: 98 mg/dL (ref 70–99)
Potassium: 4.6 mmol/L (ref 3.5–5.1)
Sodium: 136 mmol/L (ref 135–145)

## 2019-05-20 MED ORDER — CIPROFLOXACIN HCL 500 MG PO TABS
500.0000 mg | ORAL_TABLET | Freq: Once | ORAL | Status: AC
  Administered 2019-05-20: 500 mg via ORAL
  Filled 2019-05-20: qty 1

## 2019-05-20 MED ORDER — CIPROFLOXACIN HCL 500 MG PO TABS: 500.0000 mg | ORAL_TABLET | Freq: Two times a day (BID) | ORAL | 0 refills | Status: AC

## 2019-05-20 NOTE — ED Notes (Signed)
Foley cath placed 3 weeks ago  He does not remember if it has been changed  During this time

## 2019-05-20 NOTE — ED Notes (Signed)
Urine bloody

## 2019-05-20 NOTE — ED Provider Notes (Signed)
[facility] EMERGENCY DEPARTMENT Provider Note   CSN: [id number] Arrival date & time: 05/20/19  1458     History Chief Complaint  Patient presents with  . Foley Catheter Issue    Henry Carter is a 79 y.o. male.  79yo male with indwelling foley catheter in place for 2-3 weeks for urinary retention. Now having pain with voiding, urine noted to be cloudy and dark. Denies abdominal pain, nausea, vomiting, fevers, chills, changes in bowel habits. Patient was last seen by urology 2 days ago, foley changed at that time.        Past Medical History:  Diagnosis Date  .  iron deficiency 01/18/2013  . Anemia   . Angioedema   . BPH (benign prostatic hyperplasia)   . COPD (chronic obstructive pulmonary disease) ([place])    STates he has copd  . Essential hypertension   . GERD (gastroesophageal reflux disease)   . Helicobacter pylori gastritis 10/10/2012  . Hyperlipidemia   . IBS (irritable bowel syndrome)   . Impotence   . Peripheral vascular disease, unspecified ([place])   . Personal history of colonic adenoma 09/22/2012  . Personal history of tobacco use, presenting hazards to health     Patient Active Problem List   Diagnosis Date Noted  . Left leg DVT ([place]) 05/18/2019  . Left leg swelling 05/11/2019  . Pre-operative cardiovascular examination 03/22/2019  . 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[place]) 07/11/2018  . Right arm weakness 07/21/2017  . Non compliance w medication regimen 07/30/2016  . 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 Mild cognitive impairment 12/11/2015  . 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 Healthcare maintenance 02/07/2013  . Iron deficiency anemia due to chronic blood loss 01/18/2013  . C O P D 04/08/2010  . Atherosclerosis of native artery of extremity with intermittent claudication ([place]) 11/26/2008  . Benign prostatic hyperplasia 10/20/2007  . PERIPHERAL VASCULAR DISEASE 05/19/2007  . Essential hypertension 02/01/2007  .  HYPERLIPIDEMIA 01/03/2007    Past Surgical History:  Procedure Laterality Date  . abdominal surg for ulcers'  1980  . COLONOSCOPY N/A 09/22/2012   Procedure: COLONOSCOPY;  Surgeon: Gatha Mayer, MD;  Location: [facility];  Service: Endoscopy;  Laterality: N/A;  . distal aortogram    . percutaneous transluminal angionplasty with placement of 2 self expanding stent in the distal left superfical femeral artery         Family History  Problem Relation Age of Onset  . Alcohol abuse Father   . Kidney disease Brother   . Drug abuse Brother   . Diabetes Brother   . Diabetes Mother   . Diabetes Brother   . Colon cancer Neg Hx     Social History   Tobacco Use  . Smoking status: Former Smoker    Packs/day: 0.25    Years: 50.00    Pack years: 12.50    Types: Cigarettes    Start date: 04/12/1957    Quit date: 04/12/2014    Years since quitting: 5.1  . Smokeless tobacco: Never Used  . Tobacco comment: using chatix; stopped now; took x 1 week / had stopped 3 years prior to starting back in 2005  Substance Use Topics  . Alcohol use: No    Alcohol/week: 0.0 standard drinks    Comment: OCC  . Drug use: Yes    Comment: States marijuanna relaxes him;     Home Medications Prior to Admission medications   Medication Sig Start Date End Date  Taking? Authorizing Provider  ciprofloxacin (CIPRO) 500 MG tablet Take 1 tablet (500 mg total) by mouth every 12 (twelve) hours for 10 days. 05/20/19 05/30/19  Tacy Learn, PA-C  clopidogrel (PLAVIX) 75 MG tablet Take 1 tablet (75 mg total) by mouth daily. 03/22/19   [name], MD  rivaroxaban (XARELTO) 20 MG TABS tablet Take 1 tablet (20 mg total) by mouth daily with supper. 05/18/19   [name], MD  Rivaroxaban 15 & 20 MG TBPK Follow package directions: Take one 15mg  tablet by mouth twice a day. On day 22, switch to one 20mg  tablet once a day. Take with food. 05/11/19   Varney Biles, MD  simvastatin (ZOCOR) 20 MG tablet Take  1 tablet (20 mg total) by mouth at bedtime. 07/13/18   [name], MD    Allergies    Ace inhibitors and Penicillins  Review of Systems   Review of Systems  Constitutional: Negative for fever.  Respiratory: Negative for shortness of breath.   Cardiovascular: Negative for chest pain.  Gastrointestinal: Negative for abdominal pain, constipation, diarrhea, nausea and vomiting.  Genitourinary: Positive for difficulty urinating and dysuria.  Skin: Negative for rash and wound.  Hematological: Bruises/bleeds easily.  Psychiatric/Behavioral: Negative for confusion.  All other systems reviewed and are negative.   Physical Exam Updated Vital Signs BP (!) 157/88   Pulse 73   Temp 98 F (36.7 C) (Oral)   Resp 20   Ht 5\' 11"  (1.803 m)   Wt 63.5 kg   SpO2 (!) 89%   BMI 19.53 kg/m   Physical Exam Vitals and nursing note reviewed.  Constitutional:      General: He is not in acute distress.    Appearance: He is well-developed. He is not diaphoretic.  HENT:     Head: Normocephalic and atraumatic.  Cardiovascular:     Rate and Rhythm: Normal rate and regular rhythm.  Pulmonary:     Effort: Pulmonary effort is normal.     Breath sounds: Normal breath sounds.  Abdominal:     General: There is no distension.     Palpations: Abdomen is soft.     Tenderness: There is no abdominal tenderness. There is no right CVA tenderness or left CVA tenderness.  Genitourinary:    Comments: Indwelling foley catheter with purulent urine  Skin:    General: Skin is warm and dry.     Findings: No erythema or rash.  Neurological:     Mental Status: He is alert and oriented to person, place, and time.  Psychiatric:        Behavior: Behavior normal.     ED Results / Procedures / Treatments   Labs (all labs ordered are listed, but only abnormal results are displayed) Labs Reviewed  URINALYSIS, ROUTINE W REFLEX MICROSCOPIC - Abnormal; Notable for the following components:      Result Value     Color, Urine RED (*)    APPearance CLOUDY (*)    pH 9.0 (*)    Hgb urine dipstick MODERATE (*)    Ketones, ur 5 (*)    Protein, ur >=300 (*)    RBC / HPF >50 (*)    Bacteria, UA MANY (*)    All other components within normal limits  CBC WITH DIFFERENTIAL/PLATELET - Abnormal; Notable for the following components:   RBC 3.49 (*)    Hemoglobin 10.1 (*)    HCT 31.9 (*)    All other components within normal limits  URINALYSIS, ROUTINE W REFLEX MICROSCOPIC - Abnormal; Notable for the following components:   APPearance CLOUDY (*)    Hgb urine dipstick LARGE (*)    Protein, ur 100 (*)    Leukocytes,Ua MODERATE (*)    RBC / HPF >50 (*)    WBC, UA >50 (*)    Bacteria, UA RARE (*)    All other components within normal limits  URINE CULTURE  URINE CULTURE  BASIC METABOLIC PANEL    EKG None  Radiology No results found.  Procedures Procedures (including critical care time)  Medications Ordered in ED Medications  ciprofloxacin (CIPRO) tablet 500 mg (has no administration in time range)    ED Course  I have reviewed the triage vital signs and the nursing notes.  Pertinent labs & imaging results that were available during my care of the patient were reviewed by me and considered in my medical decision making (see chart for details).  Clinical Course as of May 19 1814  Sun Feb 07, 751  3178 79 year old male presents with indwelling Foley catheter due to urinary retention with complaint of dysuria.  Patient states that he has pain every time he has to void despite having catheter in place.  On exam, abdomen is soft and nontender, patient has a Foley catheter however the tubing is purple in the bag is purple, urine appears cloudy.  Foley catheter was replaced without difficulty, will send off a new urine sample and culture and clean sample.  Patient is feeling much improved since the Foley was exchanged.  Blood pressure has improved.  CBC with hemoglobin of 10.1 otherwise  unremarkable, BMP with normal renal function.  We will plan to culture the urine sample and have patient follow-up with his provider.   [LM]  1811 Repeat urinalysis on new urine sample with new Foley concerning for UTI with moderate leukocytes, greater than 50 red blood cells, greater than 50 white blood cells.  Will cover with Cipro (pen allergic) and send a culture.  Advised patient to follow-up with his PCP.   [LM]    Clinical Course User Index [LM] [name]   MDM Rules/Calculators/A&P                      Final Clinical Impression(s) / ED Diagnoses Final diagnoses:  Dysuria  Acute cystitis with hematuria    Rx / DC Orders ED Discharge Orders         Ordered    ciprofloxacin (CIPRO) 500 MG tablet  Every 12 hours     05/20/19 1811           Tacy Learn, PA-C 05/20/19 1816    Lacretia Leigh, MD 05/22/19 [phone]

## 2019-05-20 NOTE — ED Notes (Signed)
Pt reports that he is comfortable at present

## 2019-05-20 NOTE — Discharge Instructions (Signed)
Take antibiotics as prescribed and complete the full course. Follow up with your urologist. Return to Er for fevers, worsening pain or other concerns.

## 2019-05-20 NOTE — ED Triage Notes (Signed)
Pt in POV. Reports a "problem" with his Foley Catheter but is unable to explain what the problem is. Does report pain and urine noted to be blood tinged with foul odor.

## 2019-05-22 DIAGNOSIS — R972 Elevated prostate specific antigen [PSA]: Secondary | ICD-10-CM | POA: Diagnosis not present

## 2019-05-22 DIAGNOSIS — N3000 Acute cystitis without hematuria: Secondary | ICD-10-CM | POA: Diagnosis not present

## 2019-05-22 DIAGNOSIS — R338 Other retention of urine: Secondary | ICD-10-CM | POA: Diagnosis not present

## 2019-05-23 LAB — URINE CULTURE: Culture: 60000 — AB

## 2019-05-24 ENCOUNTER — Telehealth: Payer: Self-pay | Admitting: *Deleted

## 2019-05-24 LAB — URINE CULTURE: Culture: >=100000 — AB

## 2019-05-24 NOTE — Telephone Encounter (Signed)
Post ED Visit - Positive Culture Follow-up  Culture report reviewed by antimicrobial stewardship pharmacist: [place] Team []  Elenor Quinones, Pharm.D. []  Heide Guile, Pharm.D., BCPS AQ-ID []  Parks Neptune, Pharm.D., BCPS []  Alycia Rossetti, Pharm.D., BCPS []  [place], Pharm.D., BCPS, AAHIVP []  Legrand Como, Pharm.D., BCPS, AAHIVP [x]  Salome Arnt, PharmD, BCPS []  Johnnette Gourd, PharmD, BCPS []  Hughes Better, PharmD, BCPS []  Leeroy Cha, PharmD []  Laqueta Linden, PharmD, BCPS []  Albertina Parr, PharmD  [place] Team []  Leodis Sias, PharmD []  Lindell Spar, PharmD []  Royetta Asal, PharmD []  Graylin Shiver, Rph []  Rema Fendt) Glennon Mac, PharmD []  Arlyn Dunning, PharmD []  Netta Cedars, PharmD []  Dia Sitter, PharmD []  Leone Haven, PharmD []  Gretta Arab, PharmD []  Theodis Shove, PharmD []  Peggyann Juba, PharmD []  Reuel Boom, PharmD   Positive urine  culture Treated with Ciprofloxacin HCL, organism sensitive to the same and no further patient follow-up is required at this time.  Harlon Flor [facility] 05/24/2019, 9:35 AM

## 2019-05-25 ENCOUNTER — Telehealth: Payer: Self-pay | Admitting: *Deleted

## 2019-05-25 NOTE — Telephone Encounter (Signed)
Post ED Visit - Positive Culture Follow-up  Culture report reviewed by antimicrobial stewardship pharmacist: [place] Team []  Elenor Quinones, Pharm.D. []  Heide Guile, Pharm.D., BCPS AQ-ID [x]  Parks Neptune, Pharm.D., BCPS []  Alycia Rossetti, Pharm.D., BCPS []  [place], Pharm.D., BCPS, AAHIVP []  Legrand Como, Pharm.D., BCPS, AAHIVP []  Salome Arnt, PharmD, BCPS []  Johnnette Gourd, PharmD, BCPS []  Hughes Better, PharmD, BCPS []  Leeroy Cha, PharmD []  Laqueta Linden, PharmD, BCPS []  Albertina Parr, PharmD  [place] Team []  Leodis Sias, PharmD []  Lindell Spar, PharmD []  Royetta Asal, PharmD []  Graylin Shiver, Rph []  Rema Fendt) Glennon Mac, PharmD []  Arlyn Dunning, PharmD []  Netta Cedars, PharmD []  Dia Sitter, PharmD []  Leone Haven, PharmD []  Gretta Arab, PharmD []  Theodis Shove, PharmD []  Peggyann Juba, PharmD []  Reuel Boom, PharmD   Positive urine culture Treated with Ciprofloxacin, organism sensitive to the same and no further patient follow-up is required at this time.  Harlon Flor [facility] 05/25/2019, 1:56 PM

## 2019-05-30 ENCOUNTER — Telehealth: Payer: Self-pay

## 2019-05-30 NOTE — Telephone Encounter (Signed)
No, with new DVT he cannot stop anticoagulation for 3 months.

## 2019-05-30 NOTE — Telephone Encounter (Signed)
Spoke to [facility], she would like to know if it is ok for pt to stop taking the Plavix 5 days prior to procedure and Xalrelto 3 days prior to procedure. He is schedule for a prostate biopsy on 2/25.

## 2019-05-30 NOTE — Telephone Encounter (Signed)
Henry Carter has been informed.

## 2019-06-07 ENCOUNTER — Other Ambulatory Visit: Payer: Self-pay

## 2019-06-07 DIAGNOSIS — I82402 Acute embolism and thrombosis of unspecified deep veins of left lower extremity: Secondary | ICD-10-CM

## 2019-06-08 ENCOUNTER — Ambulatory Visit (HOSPITAL_COMMUNITY): Admission: RE | Admit: 2019-06-08 | Payer: Medicare Other | Source: Ambulatory Visit

## 2019-06-08 ENCOUNTER — Ambulatory Visit: Payer: Medicare Other | Admitting: Vascular Surgery

## 2019-06-09 ENCOUNTER — Other Ambulatory Visit: Payer: Self-pay

## 2019-06-09 ENCOUNTER — Encounter (HOSPITAL_COMMUNITY): Payer: Self-pay

## 2019-06-09 ENCOUNTER — Inpatient Hospital Stay (HOSPITAL_COMMUNITY): Payer: Medicare HMO

## 2019-06-09 ENCOUNTER — Inpatient Hospital Stay (HOSPITAL_COMMUNITY)
Admission: EM | Admit: 2019-06-09 | Discharge: 2019-06-15 | DRG: 698 | Disposition: A | Discharge status: Home Health | Payer: Medicare HMO | Attending: Internal Medicine | Admitting: Internal Medicine

## 2019-06-09 ENCOUNTER — Emergency Department (HOSPITAL_COMMUNITY): Payer: Medicare HMO

## 2019-06-09 DIAGNOSIS — E876 Hypokalemia: Secondary | ICD-10-CM

## 2019-06-09 DIAGNOSIS — Y732 Prosthetic and other implants, materials and accessory gastroenterology and urology devices associated with adverse incidents: Secondary | ICD-10-CM | POA: Diagnosis present

## 2019-06-09 DIAGNOSIS — G912 (Idiopathic) normal pressure hydrocephalus: Secondary | ICD-10-CM | POA: Diagnosis not present

## 2019-06-09 DIAGNOSIS — Z20822 Contact with and (suspected) exposure to covid-19: Secondary | ICD-10-CM | POA: Diagnosis present

## 2019-06-09 DIAGNOSIS — A419 Sepsis, unspecified organism: Secondary | ICD-10-CM | POA: Diagnosis not present

## 2019-06-09 DIAGNOSIS — I82452 Acute embolism and thrombosis of left peroneal vein: Secondary | ICD-10-CM | POA: Diagnosis present

## 2019-06-09 DIAGNOSIS — N320 Bladder-neck obstruction: Secondary | ICD-10-CM | POA: Diagnosis present

## 2019-06-09 DIAGNOSIS — Z880 Allergy status to penicillin: Secondary | ICD-10-CM

## 2019-06-09 DIAGNOSIS — J449 Chronic obstructive pulmonary disease, unspecified: Secondary | ICD-10-CM | POA: Diagnosis not present

## 2019-06-09 DIAGNOSIS — G9341 Metabolic encephalopathy: Secondary | ICD-10-CM | POA: Diagnosis not present

## 2019-06-09 DIAGNOSIS — E871 Hypo-osmolality and hyponatremia: Secondary | ICD-10-CM | POA: Diagnosis not present

## 2019-06-09 DIAGNOSIS — D649 Anemia, unspecified: Secondary | ICD-10-CM | POA: Diagnosis not present

## 2019-06-09 DIAGNOSIS — N029 Recurrent and persistent hematuria with unspecified morphologic changes: Secondary | ICD-10-CM | POA: Diagnosis present

## 2019-06-09 DIAGNOSIS — I714 Abdominal aortic aneurysm, without rupture: Secondary | ICD-10-CM | POA: Diagnosis present

## 2019-06-09 DIAGNOSIS — R338 Other retention of urine: Secondary | ICD-10-CM | POA: Diagnosis present

## 2019-06-09 DIAGNOSIS — I82432 Acute embolism and thrombosis of left popliteal vein: Secondary | ICD-10-CM | POA: Diagnosis present

## 2019-06-09 DIAGNOSIS — E872 Acidosis, unspecified: Secondary | ICD-10-CM

## 2019-06-09 DIAGNOSIS — Z9181 History of falling: Secondary | ICD-10-CM

## 2019-06-09 DIAGNOSIS — N139 Obstructive and reflux uropathy, unspecified: Secondary | ICD-10-CM | POA: Diagnosis not present

## 2019-06-09 DIAGNOSIS — R000 Tachycardia, unspecified: Secondary | ICD-10-CM | POA: Diagnosis not present

## 2019-06-09 DIAGNOSIS — E875 Hyperkalemia: Secondary | ICD-10-CM

## 2019-06-09 DIAGNOSIS — R5381 Other malaise: Secondary | ICD-10-CM | POA: Diagnosis not present

## 2019-06-09 DIAGNOSIS — I6782 Cerebral ischemia: Secondary | ICD-10-CM | POA: Diagnosis not present

## 2019-06-09 DIAGNOSIS — T83091A Other mechanical complication of indwelling urethral catheter, initial encounter: Principal | ICD-10-CM | POA: Diagnosis present

## 2019-06-09 DIAGNOSIS — Y846 Urinary catheterization as the cause of abnormal reaction of the patient, or of later complication, without mention of misadventure at the time of the procedure: Secondary | ICD-10-CM | POA: Diagnosis present

## 2019-06-09 DIAGNOSIS — I82442 Acute embolism and thrombosis of left tibial vein: Secondary | ICD-10-CM | POA: Diagnosis present

## 2019-06-09 DIAGNOSIS — R972 Elevated prostate specific antigen [PSA]: Secondary | ICD-10-CM | POA: Diagnosis not present

## 2019-06-09 DIAGNOSIS — N401 Enlarged prostate with lower urinary tract symptoms: Secondary | ICD-10-CM | POA: Diagnosis present

## 2019-06-09 DIAGNOSIS — N179 Acute kidney failure, unspecified: Secondary | ICD-10-CM

## 2019-06-09 DIAGNOSIS — R310 Gross hematuria: Secondary | ICD-10-CM

## 2019-06-09 DIAGNOSIS — Z888 Allergy status to other drugs, medicaments and biological substances status: Secondary | ICD-10-CM

## 2019-06-09 DIAGNOSIS — I739 Peripheral vascular disease, unspecified: Secondary | ICD-10-CM | POA: Diagnosis present

## 2019-06-09 DIAGNOSIS — N400 Enlarged prostate without lower urinary tract symptoms: Secondary | ICD-10-CM | POA: Diagnosis not present

## 2019-06-09 DIAGNOSIS — J439 Emphysema, unspecified: Secondary | ICD-10-CM | POA: Diagnosis not present

## 2019-06-09 DIAGNOSIS — Z95820 Peripheral vascular angioplasty status with implants and grafts: Secondary | ICD-10-CM

## 2019-06-09 DIAGNOSIS — Z9114 Patient's other noncompliance with medication regimen: Secondary | ICD-10-CM

## 2019-06-09 DIAGNOSIS — R5383 Other fatigue: Secondary | ICD-10-CM | POA: Diagnosis not present

## 2019-06-09 DIAGNOSIS — E785 Hyperlipidemia, unspecified: Secondary | ICD-10-CM | POA: Diagnosis present

## 2019-06-09 DIAGNOSIS — R627 Adult failure to thrive: Secondary | ICD-10-CM | POA: Diagnosis present

## 2019-06-09 DIAGNOSIS — D62 Acute posthemorrhagic anemia: Secondary | ICD-10-CM

## 2019-06-09 DIAGNOSIS — I10 Essential (primary) hypertension: Secondary | ICD-10-CM | POA: Diagnosis present

## 2019-06-09 DIAGNOSIS — E860 Dehydration: Secondary | ICD-10-CM | POA: Diagnosis present

## 2019-06-09 DIAGNOSIS — Z79899 Other long term (current) drug therapy: Secondary | ICD-10-CM

## 2019-06-09 DIAGNOSIS — Z833 Family history of diabetes mellitus: Secondary | ICD-10-CM

## 2019-06-09 DIAGNOSIS — Z86010 Personal history of colonic polyps: Secondary | ICD-10-CM

## 2019-06-09 DIAGNOSIS — I82402 Acute embolism and thrombosis of unspecified deep veins of left lower extremity: Secondary | ICD-10-CM | POA: Diagnosis not present

## 2019-06-09 DIAGNOSIS — N138 Other obstructive and reflux uropathy: Secondary | ICD-10-CM | POA: Diagnosis present

## 2019-06-09 DIAGNOSIS — Z841 Family history of disorders of kidney and ureter: Secondary | ICD-10-CM

## 2019-06-09 DIAGNOSIS — I82412 Acute embolism and thrombosis of left femoral vein: Secondary | ICD-10-CM | POA: Diagnosis not present

## 2019-06-09 DIAGNOSIS — Z86718 Personal history of other venous thrombosis and embolism: Secondary | ICD-10-CM

## 2019-06-09 DIAGNOSIS — Z8673 Personal history of transient ischemic attack (TIA), and cerebral infarction without residual deficits: Secondary | ICD-10-CM

## 2019-06-09 DIAGNOSIS — E869 Volume depletion, unspecified: Secondary | ICD-10-CM | POA: Diagnosis not present

## 2019-06-09 DIAGNOSIS — R319 Hematuria, unspecified: Secondary | ICD-10-CM | POA: Diagnosis not present

## 2019-06-09 DIAGNOSIS — Z7902 Long term (current) use of antithrombotics/antiplatelets: Secondary | ICD-10-CM

## 2019-06-09 DIAGNOSIS — Z5329 Procedure and treatment not carried out because of patient's decision for other reasons: Secondary | ICD-10-CM | POA: Diagnosis not present

## 2019-06-09 DIAGNOSIS — Z7901 Long term (current) use of anticoagulants: Secondary | ICD-10-CM

## 2019-06-09 DIAGNOSIS — Z6820 Body mass index (BMI) 20.0-20.9, adult: Secondary | ICD-10-CM

## 2019-06-09 DIAGNOSIS — Z87891 Personal history of nicotine dependence: Secondary | ICD-10-CM

## 2019-06-09 DIAGNOSIS — R509 Fever, unspecified: Secondary | ICD-10-CM | POA: Diagnosis not present

## 2019-06-09 DIAGNOSIS — Z8719 Personal history of other diseases of the digestive system: Secondary | ICD-10-CM

## 2019-06-09 LAB — URINALYSIS, MICROSCOPIC (REFLEX): RBC / HPF: >50 RBC/hpf (ref 0–5)

## 2019-06-09 LAB — URINALYSIS, ROUTINE W REFLEX MICROSCOPIC

## 2019-06-09 LAB — BASIC METABOLIC PANEL
Anion gap: 18 — ABNORMAL HIGH (ref 5–15)
BUN: 143 mg/dL — ABNORMAL HIGH (ref 8–23)
CO2: 11 mmol/L — ABNORMAL LOW (ref 22–32)
Calcium: 8.6 mg/dL — ABNORMAL LOW (ref 8.9–10.3)
Chloride: 98 mmol/L (ref 98–111)
Creatinine, Ser: 10.9 mg/dL — ABNORMAL HIGH (ref 0.61–1.24)
GFR calc Af Amer: 5 mL/min — ABNORMAL LOW (ref >60)
GFR calc non Af Amer: 4 mL/min — ABNORMAL LOW (ref >60)
Glucose, Bld: 98 mg/dL (ref 70–99)
Potassium: 6.4 mmol/L (ref 3.5–5.1)
Sodium: 127 mmol/L — ABNORMAL LOW (ref 135–145)

## 2019-06-09 LAB — COMPREHENSIVE METABOLIC PANEL
ALT: 25 U/L (ref 0–44)
AST: 23 U/L (ref 15–41)
Albumin: 2.4 g/dL — ABNORMAL LOW (ref 3.5–5.0)
Alkaline Phosphatase: 66 U/L (ref 38–126)
Anion gap: 19 — ABNORMAL HIGH (ref 5–15)
BUN: 140 mg/dL — ABNORMAL HIGH (ref 8–23)
CO2: 11 mmol/L — ABNORMAL LOW (ref 22–32)
Calcium: 8.4 mg/dL — ABNORMAL LOW (ref 8.9–10.3)
Chloride: 97 mmol/L — ABNORMAL LOW (ref 98–111)
Creatinine, Ser: 10.79 mg/dL — ABNORMAL HIGH (ref 0.61–1.24)
GFR calc Af Amer: 5 mL/min — ABNORMAL LOW (ref >60)
GFR calc non Af Amer: 4 mL/min — ABNORMAL LOW (ref >60)
Glucose, Bld: 129 mg/dL — ABNORMAL HIGH (ref 70–99)
Potassium: 6.2 mmol/L — ABNORMAL HIGH (ref 3.5–5.1)
Sodium: 127 mmol/L — ABNORMAL LOW (ref 135–145)
Total Bilirubin: 1 mg/dL (ref 0.3–1.2)
Total Protein: 5.6 g/dL — ABNORMAL LOW (ref 6.5–8.1)

## 2019-06-09 LAB — TROPONIN I (HIGH SENSITIVITY)
Troponin I (High Sensitivity): 14 ng/L (ref <18)
Troponin I (High Sensitivity): 16 ng/L (ref <18)

## 2019-06-09 LAB — CBC WITH DIFFERENTIAL/PLATELET
Abs Immature Granulocytes: 0.04 10*3/uL (ref 0.00–0.07)
Basophils Absolute: 0 10*3/uL (ref 0.0–0.1)
Basophils Relative: 0 %
Eosinophils Absolute: 0 10*3/uL (ref 0.0–0.5)
Eosinophils Relative: 0 %
HCT: 19.5 % — ABNORMAL LOW (ref 39.0–52.0)
Hemoglobin: 6.4 g/dL — CL (ref 13.0–17.0)
Immature Granulocytes: 1 %
Lymphocytes Relative: 3 %
Lymphs Abs: 0.2 10*3/uL — ABNORMAL LOW (ref 0.7–4.0)
MCH: 29.1 pg (ref 26.0–34.0)
MCHC: 32.8 g/dL (ref 30.0–36.0)
MCV: 88.6 fL (ref 80.0–100.0)
Monocytes Absolute: 0.7 10*3/uL (ref 0.1–1.0)
Monocytes Relative: 8 %
Neutro Abs: 7.7 10*3/uL (ref 1.7–7.7)
Neutrophils Relative %: 88 %
Platelets: 343 10*3/uL (ref 150–400)
RBC: 2.2 MIL/uL — ABNORMAL LOW (ref 4.22–5.81)
RDW: 16.6 % — ABNORMAL HIGH (ref 11.5–15.5)
WBC: 8.7 10*3/uL (ref 4.0–10.5)
nRBC: 0.5 % — ABNORMAL HIGH (ref 0.0–0.2)

## 2019-06-09 LAB — PROTIME-INR
INR: 2.6 — ABNORMAL HIGH (ref 0.8–1.2)
Prothrombin Time: 28.1 seconds — ABNORMAL HIGH (ref 11.4–15.2)

## 2019-06-09 LAB — PREPARE RBC (CROSSMATCH)

## 2019-06-09 LAB — POC OCCULT BLOOD, ED: Fecal Occult Bld: POSITIVE — AB

## 2019-06-09 LAB — AMMONIA: Ammonia: 53 umol/L — ABNORMAL HIGH (ref 9–35)

## 2019-06-09 LAB — LACTIC ACID, PLASMA
Lactic Acid, Venous: 2.2 mmol/L (ref 0.5–1.9)
Lactic Acid, Venous: 1.3 mmol/L (ref 0.5–1.9)

## 2019-06-09 LAB — HEMOGLOBIN AND HEMATOCRIT, BLOOD
HCT: 25.2 % — ABNORMAL LOW (ref 39.0–52.0)
Hemoglobin: 8.6 g/dL — ABNORMAL LOW (ref 13.0–17.0)

## 2019-06-09 LAB — VITAMIN B12: Vitamin B-12: 238 pg/mL (ref 180–914)

## 2019-06-09 LAB — TSH: TSH: 1.102 u[IU]/mL (ref 0.350–4.500)

## 2019-06-09 LAB — CK: Total CK: 81 U/L (ref 49–397)

## 2019-06-09 LAB — ABO/RH: ABO/RH(D): O POS

## 2019-06-09 LAB — CBG MONITORING, ED: Glucose-Capillary: 108 mg/dL — ABNORMAL HIGH (ref 70–99)

## 2019-06-09 LAB — POC SARS CORONAVIRUS 2 AG -  ED: SARS Coronavirus 2 Ag: NEGATIVE

## 2019-06-09 MED ORDER — ACETAMINOPHEN 650 MG RE SUPP: 650.0000 mg | Freq: Four times a day (QID) | RECTAL | Status: DC | PRN

## 2019-06-09 MED ORDER — ACETAMINOPHEN 325 MG PO TABS
650.0000 mg | ORAL_TABLET | Freq: Four times a day (QID) | ORAL | Status: DC | PRN
  Administered 2019-06-14: 650 mg via ORAL
  Filled 2019-06-09: qty 2

## 2019-06-09 MED ORDER — INSULIN ASPART 100 UNIT/ML IV SOLN
5.0000 [IU] | Freq: Once | INTRAVENOUS | Status: AC
  Administered 2019-06-09: 5 [IU] via INTRAVENOUS

## 2019-06-09 MED ORDER — SODIUM CHLORIDE 0.9% IV SOLUTION: Freq: Once | INTRAVENOUS | Status: AC

## 2019-06-09 MED ORDER — SODIUM CHLORIDE 0.9 % IV BOLUS
1000.0000 mL | Freq: Once | INTRAVENOUS | Status: AC
  Administered 2019-06-09: 1000 mL via INTRAVENOUS

## 2019-06-09 MED ORDER — STERILE WATER FOR INJECTION IV SOLN
INTRAVENOUS | Status: DC
  Filled 2019-06-09 (×8): qty 850

## 2019-06-09 MED ORDER — DEXTROSE 50 % IV SOLN
1.0000 | Freq: Once | INTRAVENOUS | Status: AC
  Administered 2019-06-09: 50 mL via INTRAVENOUS
  Filled 2019-06-09: qty 50

## 2019-06-09 MED ORDER — PANTOPRAZOLE SODIUM 40 MG IV SOLR
40.0000 mg | Freq: Once | INTRAVENOUS | Status: AC
  Administered 2019-06-09: 40 mg via INTRAVENOUS
  Filled 2019-06-09: qty 40

## 2019-06-09 MED ORDER — SODIUM POLYSTYRENE SULFONATE 15 GM/60ML PO SUSP
30.0000 g | Freq: Once | ORAL | Status: AC
  Administered 2019-06-09: 30 g via ORAL
  Filled 2019-06-09: qty 120

## 2019-06-09 MED ORDER — PANTOPRAZOLE SODIUM 40 MG IV SOLR
40.0000 mg | Freq: Two times a day (BID) | INTRAVENOUS | Status: DC
  Administered 2019-06-10 – 2019-06-11 (×4): 40 mg via INTRAVENOUS
  Filled 2019-06-09 (×5): qty 40

## 2019-06-09 MED ORDER — CALCIUM GLUCONATE-NACL 1-0.675 GM/50ML-% IV SOLN
1.0000 g | Freq: Once | INTRAVENOUS | Status: AC
  Administered 2019-06-09: 1000 mg via INTRAVENOUS
  Filled 2019-06-09: qty 50

## 2019-06-09 MED ORDER — SODIUM CHLORIDE 0.9 % IV BOLUS
2000.0000 mL | Freq: Once | INTRAVENOUS | Status: AC
  Administered 2019-06-09: 2000 mL via INTRAVENOUS

## 2019-06-09 MED ORDER — MORPHINE SULFATE (PF) 2 MG/ML IV SOLN
0.5000 mg | INTRAVENOUS | Status: DC | PRN
  Administered 2019-06-09: 0.5 mg via INTRAVENOUS
  Filled 2019-06-09: qty 1

## 2019-06-09 MED ORDER — SODIUM ZIRCONIUM CYCLOSILICATE 10 G PO PACK
10.0000 g | PACK | Freq: Three times a day (TID) | ORAL | Status: DC
  Administered 2019-06-10: 10 g via ORAL
  Filled 2019-06-09: qty 1

## 2019-06-09 NOTE — ED Notes (Signed)
Pt refused to stand for orthostatic VS, stating that he was too weak and in too much penile discomfort.

## 2019-06-09 NOTE — ED Triage Notes (Signed)
Pt bib ems, ems reprots that the pt has been dealing with a UTI for about a month. Family reports the pt has not been eating or drinking well for the last 10 days.   110/60lying  110-120 hr 24rr 98% ra 100.2*f

## 2019-06-09 NOTE — H&P (Signed)
History and Physical    Henry Carter [id number] DOB: [birth year] DOA: 06/09/2019  PCP: [name], MD Patient coming from: Home  Chief Complaint: Altered mental status, decreased p.o. intake  HPI: Henry Carter is a 79 y.o. male with medical history significant of iron deficiency anemia, BPH, COPD, hypertension, GERD, hyperlipidemia, PVD, history of DVT on anticoagulation, has a chronic indwelling foley catheter for BPH presenting to the ED for evaluation of altered mental status and decreased p.o. intake.  Patient's wife reported to ED provider that he has not been eating/drinking or taking his medications for the last 9 days.  He fell 2 days ago while getting out of bed and hit his head on the carpet.  No loss of consciousness reported.  He has been confused.  He has had bleeding from his Foley for the last 7 to 8 days.  Patient is confused and not sure why he is here.  His only complaint is suprapubic pain.  Denies chest pain, shortness of breath, nausea, vomiting, or diarrhea.  No additional history could be obtained from him.  ED Course: Afebrile.  Slightly tachycardic.  Labs showing no leukocytosis.  Lactic acid mildly elevated at 2.2.  Hemoglobin 6.4, was in the 10-11 range on recent labs.  FOBT positive.  No grossly bloody or melanotic stool noted on rectal exam.  Sodium 127, potassium 6.2, bicarb 11, anion gap 19, glucose 129.  BUN 140, creatinine 10.7.  Baseline creatinine 0.9.  LFTs normal.  Blood culture x2 pending.  High-sensitivity troponin negative.  CK normal.  SARS-CoV-2 rapid antigen test negative, PCR test pending.  UA uninterpretable due to gross hematuria. Microscopic examination showing greater than 50 RBCs, 0-5 WBCs, and many bacteria.  Urine culture pending. Chest x-ray showing emphysema and no active cardiopulmonary process. Head CT negative for acute intracranial abnormality.  Showing stable appearance of the lateral ventricles out of proportion to the  degree of cerebral atrophy, compatible with normal pressure hydrocephalus.  Patient received IV Protonix 40 mg and 1 L normal saline bolus.  1 unit PRBCs ordered.  Review of Systems:  All systems reviewed and apart from history of presenting illness, are negative.  Past Medical History:  Diagnosis Date  .  iron deficiency 01/18/2013  . Anemia   . Angioedema   . BPH (benign prostatic hyperplasia)   . COPD (chronic obstructive pulmonary disease) ([place])    STates he has copd  . Essential hypertension   . GERD (gastroesophageal reflux disease)   . Helicobacter pylori gastritis 10/10/2012  . Hyperlipidemia   . IBS (irritable bowel syndrome)   . Impotence   . Peripheral vascular disease, unspecified ([place])   . Personal history of colonic adenoma 09/22/2012  . Personal history of tobacco use, presenting hazards to health     Past Surgical History:  Procedure Laterality Date  . abdominal surg for ulcers'  1980  . COLONOSCOPY N/A 09/22/2012   Procedure: COLONOSCOPY;  Surgeon: Gatha Mayer, MD;  Location: [facility];  Service: Endoscopy;  Laterality: N/A;  . distal aortogram    . percutaneous transluminal angionplasty with placement of 2 self expanding stent in the distal left superfical femeral artery       reports that he quit smoking about 5 years ago. His smoking use included cigarettes. He started smoking about 62 years ago. He has a 12.50 pack-year smoking history. He has never used smokeless tobacco. He reports current drug use. He reports that he does not drink alcohol.  Allergies  Allergen Reactions  . Ace Inhibitors     REACTION: antioedema  . Penicillins Hives    Has patient had a PCN reaction causing immediate rash, facial/tongue/throat swelling, SOB or lightheadedness with hypotension: No Has patient had a PCN reaction causing severe rash involving mucus membranes or skin necrosis: No Has patient had a PCN reaction that required hospitalization yes Has patient had a PCN  reaction occurring within the last 10 years: no If all of the above answers are "NO", then may proceed with Cephalosporin use.    Family History  Problem Relation Age of Onset  . Alcohol abuse Father   . Kidney disease Brother   . Drug abuse Brother   . Diabetes Brother   . Diabetes Mother   . Diabetes Brother   . Colon cancer Neg Hx     Prior to Admission medications   Medication Sig Start Date End Date Taking? Authorizing Provider  clopidogrel (PLAVIX) 75 MG tablet Take 1 tablet (75 mg total) by mouth daily. 03/22/19  Yes [name], MD  rivaroxaban (XARELTO) 20 MG TABS tablet Take 1 tablet (20 mg total) by mouth daily with supper. 05/18/19  Yes [name], MD  simvastatin (ZOCOR) 20 MG tablet Take 1 tablet (20 mg total) by mouth at bedtime. 07/13/18  Yes [name], MD  Rivaroxaban 15 & 20 MG TBPK Follow package directions: Take one 15mg  tablet by mouth twice a day. On day 22, switch to one 20mg  tablet once a day. Take with food. Patient not taking: Reported on 06/09/2019 05/11/19   Varney Biles, MD    Physical Exam: Vitals:   06/09/19 2015 06/09/19 2030 06/09/19 2045 06/09/19 2100  BP: (!) 151/83 139/69 (!) 164/85 (!) 145/82  Pulse: (!) 111 98 (!) 113 (!) 104  Resp: 20 14 20  (!) 22  Temp:      TempSrc:      SpO2: 100% 100% 100% 100%  Weight:      Height:        Physical Exam  Constitutional: He appears well-developed and well-nourished. No distress.  HENT:  Head: Normocephalic.  Very dry mucous membranes  Eyes: Right eye exhibits no discharge. Left eye exhibits no discharge.  Cardiovascular: Normal rate, regular rhythm and intact distal pulses.  Pulmonary/Chest: Effort normal and breath sounds normal. No respiratory distress. He has no wheezes. He has no rales.  Abdominal: Soft. Bowel sounds are normal. He exhibits no distension. There is abdominal tenderness. There is no rebound and no guarding.  Mild suprapubic tenderness    Genitourinary:    Genitourinary Comments: Nursing staff at bedside Diaper soaked in blood Active bleeding from the urethral meatus   Musculoskeletal:        General: No edema.     Cervical back: Neck supple.  Neurological:  Awake and alert Oriented to person place Appears confused No focal motor or sensory deficit.  Skin: Skin is warm and dry. He is not diaphoretic.     Labs on Admission: I have personally reviewed following labs and imaging studies  CBC: Recent Labs  Lab 06/09/19 1425  WBC 8.7  NEUTROABS 7.7  HGB 6.4*  HCT 19.5*  MCV 88.6  PLT [id number]   Basic Metabolic Panel: Recent Labs  Lab 06/09/19 1425  NA 127*  K 6.2*  CL 97*  CO2 11*  GLUCOSE 129*  BUN 140*  CREATININE 10.79*  CALCIUM 8.4*   GFR: Estimated Creatinine Clearance: 5.5 mL/min (A) (by C-G formula based on  SCr of 10.79 mg/dL (H)). Liver Function Tests: Recent Labs  Lab 06/09/19 1425  AST 23  ALT 25  ALKPHOS 66  BILITOT 1.0  PROT 5.6*  ALBUMIN 2.4*   No results for input(s): LIPASE, AMYLASE in the last 168 hours. No results for input(s): AMMONIA in the last 168 hours. Coagulation Profile: Recent Labs  Lab 06/09/19 1425  INR 2.6*   Cardiac Enzymes: Recent Labs  Lab 06/09/19 1425  CKTOTAL 81   BNP (last 3 results) No results for input(s): PROBNP in the last 8760 hours. HbA1C: No results for input(s): HGBA1C in the last 72 hours. CBG: No results for input(s): GLUCAP in the last 168 hours. Lipid Profile: No results for input(s): CHOL, HDL, LDLCALC, TRIG, CHOLHDL, LDLDIRECT in the last 72 hours. Thyroid Function Tests: No results for input(s): TSH, T4TOTAL, FREET4, T3FREE, THYROIDAB in the last 72 hours. Anemia Panel: No results for input(s): VITAMINB12, FOLATE, FERRITIN, TIBC, IRON, RETICCTPCT in the last 72 hours. Urine analysis:    Component Value Date/Time   COLORURINE BROWN (A) 06/09/2019 1858   APPEARANCEUR TURBID (A) 06/09/2019 1858   LABSPEC  06/09/2019 1858     TEST NOT REPORTED DUE TO COLOR INTERFERENCE OF URINE PIGMENT   PHURINE  06/09/2019 1858    TEST NOT REPORTED DUE TO COLOR INTERFERENCE OF URINE PIGMENT   GLUCOSEU (A) 06/09/2019 1858    TEST NOT REPORTED DUE TO COLOR INTERFERENCE OF URINE PIGMENT   HGBUR (A) 06/09/2019 1858    TEST NOT REPORTED DUE TO COLOR INTERFERENCE OF URINE PIGMENT   BILIRUBINUR (A) 06/09/2019 1858    TEST NOT REPORTED DUE TO COLOR INTERFERENCE OF URINE PIGMENT   BILIRUBINUR negative 08/01/2017 1649   KETONESUR (A) 06/09/2019 1858    TEST NOT REPORTED DUE TO COLOR INTERFERENCE OF URINE PIGMENT   PROTEINUR (A) 06/09/2019 1858    TEST NOT REPORTED DUE TO COLOR INTERFERENCE OF URINE PIGMENT   UROBILINOGEN negative (A) 08/01/2017 1649   NITRITE (A) 06/09/2019 1858    TEST NOT REPORTED DUE TO COLOR INTERFERENCE OF URINE PIGMENT   LEUKOCYTESUR (A) 06/09/2019 1858    TEST NOT REPORTED DUE TO COLOR INTERFERENCE OF URINE PIGMENT    Radiological Exams on Admission: CT ABDOMEN PELVIS WO CONTRAST  Result Date: 06/09/2019 CLINICAL DATA:  Gross hematuria EXAM: CT ABDOMEN AND PELVIS WITHOUT CONTRAST TECHNIQUE: Multidetector CT imaging of the abdomen and pelvis was performed following the standard protocol without IV contrast. COMPARISON:  None. FINDINGS: Lower chest: No acute pleural or parenchymal lung disease. Hepatobiliary: Gallbladder is decompressed which limits its evaluation. Unenhanced imaging of the liver is unremarkable. Pancreas: Unremarkable. No pancreatic ductal dilatation or surrounding inflammatory changes. Spleen: Normal in size without focal abnormality. Adrenals/Urinary Tract: There is bilateral obstructive uropathy, left greater than right. I do not see an obstructing calculus. Distension of the ureters and renal collecting systems could be due to chronic bladder outlet obstruction given the degree of bladder distention on this exam. Diffuse mural calcifications are seen throughout the bladder. No definite bladder  wall mass or filling defect. Foley catheter is seen within the bladder lumen. The adrenals are unremarkable. Stomach/Bowel: No bowel obstruction or ileus. Normal appendix right lower quadrant. No wall thickening or inflammatory changes. Vascular/Lymphatic: There is aneurysmal dilatation of the infrarenal abdominal aorta, measuring up to 4 cm in anterior-posterior dimension. Diffuse calcified atheromatous plaque throughout the aorta and its branches. No pathologic adenopathy within the abdomen or pelvis. Reproductive: Prostate is enlarged, measuring 5.4 x 5.4 cm  in transverse direction. Other: There is trace free fluid in the pelvis, nonspecific. Perirectal fat stranding is noted, also nonspecific. Musculoskeletal: No acute or destructive bony lesions. Reconstructed images demonstrate no additional findings. IMPRESSION: 1. Bilateral obstructive uropathy, left greater than right, which may be due to bladder distension and chronic bladder outlet obstruction. No evidence of urinary tract calculi. 2. Distended bladder despite Foley catheter. Please correlate with catheter function. Diffuse mural calcifications of the bladder without filling defect or mass. 3. Enlarged prostate. 4. 4 cm infrarenal abdominal aortic aneurysm. Recommend followup by ultrasound in 1 year. This recommendation follows ACR consensus guidelines: White Paper of the ACR Incidental Findings Committee II on Vascular Findings. J Am Coll Radiol 2013; 10:789-794. Aortic aneurysm NOS (ICD10-I71.9) 5. Trace pelvic free fluid. 6. Perirectal fat stranding of uncertain significance. Electronically Signed   By: Randa Ngo M.D.   On: 06/09/2019 21:29   CT Head Wo Contrast  Result Date: 06/09/2019 CLINICAL DATA:  Urinary tract infection, anorexia EXAM: CT HEAD WITHOUT CONTRAST TECHNIQUE: Contiguous axial images were obtained from the base of the skull through the vertex without intravenous contrast. COMPARISON:  12/30/2017 FINDINGS: Brain: Hypodensities  throughout the periventricular white matter compatible with stable chronic small vessel ischemic changes. No signs of acute infarct or hemorrhage. Stable prominence of the lateral ventricles. Remaining midline structures are unremarkable. No acute extra-axial fluid collections. No mass effect. Vascular: No hyperdense vessel or unexpected calcification. Skull: Normal. Negative for fracture or focal lesion. Sinuses/Orbits: No acute finding. Other: None IMPRESSION: 1. Stable chronic small vessel ischemic changes in the white matter. No acute intracranial process. 2. Stable prominence of the lateral ventricles out of proportion to the degree of cerebral atrophy, compatible with normal pressure hydrocephalus. Electronically Signed   By: Randa Ngo M.D.   On: 06/09/2019 19:29   DG Chest Portable 1 View  Result Date: 06/09/2019 CLINICAL DATA:  79 year old male with sepsis. EXAM: PORTABLE CHEST 1 VIEW COMPARISON:  Chest radiograph dated 01/13/2015 and CT dated 01/07/2012. FINDINGS: Background of emphysema. No focal consolidation, pleural effusion, pneumothorax. The cardiac silhouette is within normal limits. Atherosclerotic calcification of the aorta. No acute osseous pathology. Upper abdominal surgical suture. IMPRESSION: 1. No acute cardiopulmonary process. 2. Emphysema. Electronically Signed   By: Anner Crete M.D.   On: 06/09/2019 17:47    EKG: Independently reviewed.  Sinus tachycardia, heart rate 111.  Assessment/Plan Principal Problem:   Hematuria Active Problems:   Acute renal failure (ARF) (HCC)   Acute blood loss anemia   Hyperkalemia   Metabolic acidosis   Gross hematuria: Patient has a chronic indwelling Foley catheter in the setting of BPH.  ED provider discussed the case with Dr. Junious Silk from urology who will see the patient tonight and change the Foley catheter.  CT abdomen pelvis showing bilateral obstructive uropathy, left greater than right, which may be due to bladder distention  and chronic bladder outlet obstruction.  No evidence of urinary tract calculi.  Distended bladder despite Foley catheter and enlarged prostate.  Acute renal failure: Likely multifactorial.  Prerenal due to severe dehydration and post renal from obstructive uropathy (see CT findings above).  Nephrology at bedside.  Urology will see the patient tonight and change his Foley catheter. Appears very dry, additional IV fluid boluses ordered, continue to hydrate.  Monitor urine output closely.  Acute blood loss anemia: Suspect due to gross hematuria.  1 unit PRBCs ordered in the ED.  Monitor serial H&H.  Goal is to keep hemoglobin above 7.  Check iron, ferritin, TIBC.  Hyperkalemia: Potassium 6.2 and EKG without acute changes.  Suspect due to acute renal failure.  ED provider discussed with nephrology who recommended giving medical therapy at this time and nephrology will see the patient in the morning.  Insulin/D50, Kayexalate, Lokelma, and calcium gluconate ordered.  Bicarb infusion as mentioned below in the setting of severe metabolic acidosis. Repeat labs after treatment and continue to monitor potassium level very closely.  Severe metabolic acidosis: Likely related to acute renal failure.  Start bicarb infusion.  Continue to monitor bicarb level very closely.  Mild hyponatremia: Likely due to decreased p.o. intake and acute renal failure.  Continue IV fluid hydration.  Repeat BMP in a.m.  FOBT positive: Could possibly be contamination in the setting of gross hematuria.  No grossly bloody or melanotic stool noted on rectal exam done in the ED.  Patient was given a dose of IV PPI, will continue given history of gastritis.  Consider repeat testing after resolution of hematuria.  Mild lactic acidosis: Suspect related to severe dehydration.  No clear infectious etiology identified so far.  Received fluid bolus, repeat lactate level.  Acute metabolic encephalopathy: Suspect related to severe dehydration and  acute renal failure.  Head CT negative for acute intracranial abnormality.  Check TSH, B12, and ammonia levels.  Physical deconditioning/recent fall: PT and OT evaluation  Normal pressure hydrocephalus: Head CT showing stable appearance of the lateral ventricles out of proportion to the degree of cerebral atrophy, compatible with normal pressure hydrocephalus.  Discuss with neurology in the morning, may need shunting.  Recently diagnosed DVT 05/11/2019: Hold Xarelto at this time in the setting of acute bleeding.  Hypertension: Stable.  Currently normotensive.  Not on antihypertensives at home.  AAA: CT showing a 4 cm infrarenal abdominal aortic aneurysm.  Radiologist recommending follow-up ultrasound in 1 year per ACR consensus guidelines.  DVT prophylaxis: No anticoagulation due to acute bleeding.  No SCDs due to recently diagnosed DVT. Code Status: Full code Family Communication: No family available at this time. Disposition Plan: Anticipate discharge after resolution of hematuria and improvement of renal function.  This may take several days. Consults called: Urology (Dr. Junious Silk), nephrology (Dr. Jonnie Finner) Admission status: It is my clinical opinion that admission to INPATIENT is reasonable and necessary because of the expectation that this patient will require hospital care that crosses at least 2 midnights to treat this condition based on the medical complexity of the problems presented.  Given the aforementioned information, the predictability of an adverse outcome is felt to be significant.  The medical decision making on this patient was of high complexity and the patient is at high risk for clinical deterioration, therefore this is a level 3 visit.  Shela Leff MD [facility]  If 7PM-7AM, please contact night-coverage [email or web address]  06/09/2019, 9:54 PM

## 2019-06-09 NOTE — ED Notes (Signed)
OR cath cart at bedside per urology.

## 2019-06-09 NOTE — ED Notes (Signed)
RN unable to obtain second troponin and lactic acid due to blood transfusion.

## 2019-06-09 NOTE — ED Provider Notes (Addendum)
[facility] EMERGENCY DEPARTMENT Provider Note   CSN: [id number] Arrival date & time: 06/09/19  1401     History Chief Complaint  Patient presents with  . Altered Mental Status    Henry Carter is a 79 y.o. male.  HPI    79 year old male with a history of iron deficiency anemia, BPH, COPD, hypertension, GERD, hyperlipidemia, peripheral vascular disease, DVT on anticoagulation), who presents to the emergency department today for evaluation of altered mental status and decreased p.o. intake.  2:34 PM Spoke with [name], patients wife. She states that the patient has not been eating/drinking or taking his medications for the last 9 days.  She states patient has had the hiccups for the last few days. States two days ago he tried to get out of bed, fell and hit his head on the carpet. He did not lose consciousness.  He has been confused. She states patient was scheduled for a prostate biopsy a few days ago but he did not have the procedure. He was supposed to be on an antibiotic but he has not taken it. He has had bleeding from his foley for the last 7-8 days.    Patient states he has had a cough for the last 2 days.  States he has had bleeding for a few days from his Foley catheter.  He has some suprapubic pain as well.  He denies any chest pain or shortness of breath.  Denies any fevers.  Past Medical History:  Diagnosis Date  .  iron deficiency 01/18/2013  . Anemia   . Angioedema   . BPH (benign prostatic hyperplasia)   . COPD (chronic obstructive pulmonary disease) ([place])    STates he has copd  . Essential hypertension   . GERD (gastroesophageal reflux disease)   . Helicobacter pylori gastritis 10/10/2012  . Hyperlipidemia   . IBS (irritable bowel syndrome)   . Impotence   . Peripheral vascular disease, unspecified ([place])   . Personal history of colonic adenoma 09/22/2012  . Personal history of tobacco use, presenting hazards to health     Patient Active  Problem List   Diagnosis Date Noted  . Hematuria 06/09/2019  . Left leg DVT ([place]) 05/18/2019  . Left leg swelling 05/11/2019  . Pre-operative cardiovascular examination 03/22/2019  . 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[place]) 07/11/2018  . Right arm weakness 07/21/2017  . Non compliance w medication regimen 07/30/2016  . 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 Mild cognitive impairment 12/11/2015  . 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 Healthcare maintenance 02/07/2013  . Iron deficiency anemia due to chronic blood loss 01/18/2013  . C O P D 04/08/2010  . Atherosclerosis of native artery of extremity with intermittent claudication ([place]) 11/26/2008  . Benign prostatic hyperplasia 10/20/2007  . PERIPHERAL VASCULAR DISEASE 05/19/2007  . Essential hypertension 02/01/2007  . HYPERLIPIDEMIA 01/03/2007    Past Surgical History:  Procedure Laterality Date  . abdominal surg for ulcers'  1980  . COLONOSCOPY N/A 09/22/2012   Procedure: COLONOSCOPY;  Surgeon: Gatha Mayer, MD;  Location: [facility];  Service: Endoscopy;  Laterality: N/A;  . distal aortogram    . percutaneous transluminal angionplasty with placement of 2 self expanding stent in the distal left superfical femeral artery         Family History  Problem Relation Age of Onset  . Alcohol abuse Father   . Kidney disease Brother   . Drug abuse Brother   . Diabetes Brother   .  Diabetes Mother   . Diabetes Brother   . Colon cancer Neg Hx     Social History   Tobacco Use  . Smoking status: Former Smoker    Packs/day: 0.25    Years: 50.00    Pack years: 12.50    Types: Cigarettes    Start date: 04/12/1957    Quit date: 04/12/2014    Years since quitting: 5.1  . Smokeless tobacco: Never Used  . Tobacco comment: using chatix; stopped now; took x 1 week / had stopped 3 years prior to starting back in 2005  Substance Use Topics  . Alcohol use: No    Alcohol/week: 0.0 standard drinks    Comment: OCC  . Drug  use: Yes    Comment: States marijuanna relaxes him;     Home Medications Prior to Admission medications   Medication Sig Start Date End Date Taking? Authorizing Provider  clopidogrel (PLAVIX) 75 MG tablet Take 1 tablet (75 mg total) by mouth daily. 03/22/19  Yes [name], MD  rivaroxaban (XARELTO) 20 MG TABS tablet Take 1 tablet (20 mg total) by mouth daily with supper. 05/18/19  Yes [name], MD  simvastatin (ZOCOR) 20 MG tablet Take 1 tablet (20 mg total) by mouth at bedtime. 07/13/18  Yes [name], MD  Rivaroxaban 15 & 20 MG TBPK Follow package directions: Take one 15mg  tablet by mouth twice a day. On day 22, switch to one 20mg  tablet once a day. Take with food. Patient not taking: Reported on 06/09/2019 05/11/19   Varney Biles, MD    Allergies    Ace inhibitors and Penicillins  Review of Systems   Review of Systems  Constitutional: Positive for appetite change and fatigue. Negative for fever.  HENT: Negative for ear pain and sore throat.   Eyes: Negative for visual disturbance.  Respiratory: Positive for cough. Negative for shortness of breath.   Cardiovascular: Negative for chest pain.  Gastrointestinal: Negative for abdominal pain, constipation, diarrhea, nausea and vomiting.  Genitourinary: Positive for hematuria.  Musculoskeletal: Negative for back pain.  Skin: Negative for rash.  Neurological:       Confused per family  All other systems reviewed and are negative.   Physical Exam Updated Vital Signs BP (!) 164/85   Pulse (!) 113   Temp 97.9 F (36.6 C) (Oral)   Resp 20   Ht 5\' 11"  (1.803 m)   Wt 68 kg   SpO2 100%   BMI 20.92 kg/m   Physical Exam Vitals and nursing note reviewed.  Constitutional:      Appearance: He is well-developed.  HENT:     Head: Normocephalic and atraumatic.  Eyes:     Conjunctiva/sclera: Conjunctivae normal.  Cardiovascular:     Rate and Rhythm: Normal rate and regular rhythm.     Pulses:  Normal pulses.     Heart sounds: Normal heart sounds. No murmur.  Pulmonary:     Effort: Pulmonary effort is normal. No respiratory distress.     Breath sounds: Normal breath sounds. No wheezing, rhonchi or rales.  Abdominal:     General: Bowel sounds are normal. There is no distension.     Palpations: Abdomen is soft.     Tenderness: There is no abdominal tenderness. There is no guarding or rebound.  Genitourinary:    Comments: Chaperone present, there is blood coming out of the meatus. There is bloody urine noted in the foley bag.  Musculoskeletal:     Cervical back:  Neck supple.  Skin:    General: Skin is warm and dry.  Neurological:     Mental Status: He is alert.     Comments: Mental Status:  Alert, clear speech. Able to follow 2 step commands without difficulty.  Cranial Nerves:  II: pupils equal, round, reactive to light III,IV, VI: ptosis not present, extra-ocular motions intact bilaterally  V,VII: smile symmetric, facial light touch sensation equal VIII: hearing grossly normal to voice  X: uvula elevates symmetrically  XI: bilateral shoulder shrug symmetric and strong XII: midline tongue extension without fassiculations Motor:  Normal tone. Globally weak, but strength of BUE and BLE major muscle groups is symmetric Sensory: light touch normal in all extremities. Gait: not assessed      ED Results / Procedures / Treatments   Labs (all labs ordered are listed, but only abnormal results are displayed) Labs Reviewed  COMPREHENSIVE METABOLIC PANEL - Abnormal; Notable for the following components:      Result Value   Sodium 127 (*)    Potassium 6.2 (*)    Chloride 97 (*)    CO2 11 (*)    Glucose, Bld 129 (*)    BUN 140 (*)    Creatinine, Ser 10.79 (*)    Calcium 8.4 (*)    Total Protein 5.6 (*)    Albumin 2.4 (*)    GFR calc non Af Amer 4 (*)    GFR calc Af Amer 5 (*)    Anion gap 19 (*)    All other components within normal limits  LACTIC ACID, PLASMA -  Abnormal; Notable for the following components:   Lactic Acid, Venous 2.2 (*)    All other components within normal limits  CBC WITH DIFFERENTIAL/PLATELET - Abnormal; Notable for the following components:   RBC 2.20 (*)    Hemoglobin 6.4 (*)    HCT 19.5 (*)    RDW 16.6 (*)    nRBC 0.5 (*)    Lymphs Abs 0.2 (*)    All other components within normal limits  PROTIME-INR - Abnormal; Notable for the following components:   Prothrombin Time 28.1 (*)    INR 2.6 (*)    All other components within normal limits  URINALYSIS, ROUTINE W REFLEX MICROSCOPIC - Abnormal; Notable for the following components:   Color, Urine BROWN (*)    APPearance TURBID (*)    Glucose, UA   (*)    Value: TEST NOT REPORTED DUE TO COLOR INTERFERENCE OF URINE PIGMENT   Hgb urine dipstick   (*)    Value: TEST NOT REPORTED DUE TO COLOR INTERFERENCE OF URINE PIGMENT   Bilirubin Urine   (*)    Value: TEST NOT REPORTED DUE TO COLOR INTERFERENCE OF URINE PIGMENT   Ketones, ur   (*)    Value: TEST NOT REPORTED DUE TO COLOR INTERFERENCE OF URINE PIGMENT   Protein, ur   (*)    Value: TEST NOT REPORTED DUE TO COLOR INTERFERENCE OF URINE PIGMENT   Nitrite   (*)    Value: TEST NOT REPORTED DUE TO COLOR INTERFERENCE OF URINE PIGMENT   Leukocytes,Ua   (*)    Value: TEST NOT REPORTED DUE TO COLOR INTERFERENCE OF URINE PIGMENT   All other components within normal limits  URINALYSIS, MICROSCOPIC (REFLEX) - Abnormal; Notable for the following components:   Bacteria, UA MANY (*)    All other components within normal limits  POC OCCULT BLOOD, ED - Abnormal; Notable for the following components:  Fecal Occult Bld POSITIVE (*)    All other components within normal limits  CULTURE, BLOOD (ROUTINE X 2)  CULTURE, BLOOD (ROUTINE X 2)  URINE CULTURE  SARS CORONAVIRUS 2 (TAT 6-24 HRS)  CK  LACTIC ACID, PLASMA  POC SARS CORONAVIRUS 2 AG -  ED  TYPE AND SCREEN  PREPARE RBC (CROSSMATCH)  ABO/RH  TROPONIN I (HIGH SENSITIVITY)   TROPONIN I (HIGH SENSITIVITY)    EKG EKG Interpretation  Date/Time:  Saturday June 09 2019 14:10:08 EST Ventricular Rate:  111 PR Interval:    QRS Duration: 75 QT Interval:  314 QTC Calculation: 427 R Axis:   65 Text Interpretation: Sinus tachycardia Confirmed by Malvin Johns [phone]) on 06/09/2019 7:00:53 PM   Radiology CT Head Wo Contrast  Result Date: 06/09/2019 CLINICAL DATA:  Urinary tract infection, anorexia EXAM: CT HEAD WITHOUT CONTRAST TECHNIQUE: Contiguous axial images were obtained from the base of the skull through the vertex without intravenous contrast. COMPARISON:  12/30/2017 FINDINGS: Brain: Hypodensities throughout the periventricular white matter compatible with stable chronic small vessel ischemic changes. No signs of acute infarct or hemorrhage. Stable prominence of the lateral ventricles. Remaining midline structures are unremarkable. No acute extra-axial fluid collections. No mass effect. Vascular: No hyperdense vessel or unexpected calcification. Skull: Normal. Negative for fracture or focal lesion. Sinuses/Orbits: No acute finding. Other: None IMPRESSION: 1. Stable chronic small vessel ischemic changes in the white matter. No acute intracranial process. 2. Stable prominence of the lateral ventricles out of proportion to the degree of cerebral atrophy, compatible with normal pressure hydrocephalus. Electronically Signed   By: Randa Ngo M.D.   On: 06/09/2019 19:29   DG Chest Portable 1 View  Result Date: 06/09/2019 CLINICAL DATA:  79 year old male with sepsis. EXAM: PORTABLE CHEST 1 VIEW COMPARISON:  Chest radiograph dated 01/13/2015 and CT dated 01/07/2012. FINDINGS: Background of emphysema. No focal consolidation, pleural effusion, pneumothorax. The cardiac silhouette is within normal limits. Atherosclerotic calcification of the aorta. No acute osseous pathology. Upper abdominal surgical suture. IMPRESSION: 1. No acute cardiopulmonary process. 2. Emphysema.  Electronically Signed   By: Anner Crete M.D.   On: 06/09/2019 17:47    Procedures Procedures (including critical care time)  CRITICAL CARE Performed by: Rodney Booze   Total critical care time: 43 minutes  Critical care time was exclusive of separately billable procedures and treating other patients.  Critical care was necessary to treat or prevent imminent or life-threatening deterioration.  Critical care was time spent personally by me on the following activities: development of treatment plan with patient and/or surrogate as well as nursing, discussions with consultants, evaluation of patient's response to treatment, examination of patient, obtaining history from patient or surrogate, ordering and performing treatments and interventions, ordering and review of laboratory studies, ordering and review of radiographic studies, pulse oximetry and re-evaluation of patient's condition.   Medications Ordered in ED Medications  sodium zirconium cyclosilicate (LOKELMA) packet 10 g (has no administration in time range)  sodium polystyrene (KAYEXALATE) 15 GM/60ML suspension 30 g (has no administration in time range)  insulin aspart (novoLOG) injection 5 Units (has no administration in time range)    And  dextrose 50 % solution 50 mL (has no administration in time range)  calcium gluconate 1 g/ 50 mL sodium chloride IVPB (has no administration in time range)  sodium chloride 0.9 % bolus 1,000 mL (0 mLs Intravenous Stopped 06/09/19 1600)  0.9 %  sodium chloride infusion (Manually program via Guardrails IV Fluids) ( Intravenous Stopped  06/09/19 1910)  pantoprazole (PROTONIX) injection 40 mg (40 mg Intravenous Given 06/09/19 1931)    ED Course  I have reviewed the triage vital signs and the nursing notes.  Pertinent labs & imaging results that were available during my care of the patient were reviewed by me and considered in my medical decision making (see chart for details).    MDM  Rules/Calculators/A&P                      79 y/o M presenting with hematuria and AMS  Reviewed and interpreted labs CBC was without leukocytosis, hemoglobin low at 6.4 which is new from 2 weeks ago.  - suspect source of anemia is from urologic cause since he has had gross hematuria for at least 1 week  - hemoccult was obtained and was positive however his stool was brown and there was no melena and I think that this could likely be a contaminated specimen, he was given IV protonix  - 1 unit PRBC given CMP with hyponatremia at 127, elevated potassium at 6.2, by bicarb at 11.  Elevated BUN/creatinine at 140 and 10.79 respectively which is new from 2 weeks ago.  Liver enzymes are within normal limits.  Elevated anion gap noted Lactic acid is elevated at 2.2. Troponin is negative CK is normal PT/INR is elevated at 2.6 POC covid neg UA unable to be interpreted, microscopic UA with >50 RBCsm 0-5 WBCs, and many bacteria. Urine culture was sent.   CXR neg for acute cardiopulmonary process  CT head with stable chronic small vessel ischemic changes in the white matter. No acute intracranial process. Stable prominence of the lateral ventricles out of proportion to the degree of cerebral atrophy, compatible with normal pressure hydrocephalus  8:10 PM CONSULT with Dr. Junious Silk who recommends changing out the Foley catheter.  He also recommends obtaining a Noncon CT abdomen/pelvis.  8:28 PM CONSULT with Dr. Lily Kocher with hospitalist service who accepts patient for admission. She recommends consultation with nephrology.  8:47 PM CONSULT with Dr. Jonnie Finner with nephrology who recommends aggressively tx'ing K with 30 mg kayexalate, 10 mg lokelma x2 with first dose at 1-2AM, and also giving 5 units insulin and an am of d50.  Discussed nephrology updates with Dr. Lily Kocher. She also recommended 1 g calcium gluconate.  Final Clinical Impression(s) / ED Diagnoses Final diagnoses:  Acute renal failure,  unspecified acute renal failure type ([place])  Gross hematuria  Anemia, unspecified type    Rx / DC Orders ED Discharge Orders    None       Tivis Ringer, Luken Shadowens S, PA-C 06/09/19 2032    Rodney Booze, PA-C 06/09/19 2056    Malvin Johns, MD 06/10/19 1110

## 2019-06-09 NOTE — ED Notes (Signed)
RN will collect troponin and lactic acid at 2110, 2 hours post transfusion.

## 2019-06-09 NOTE — Consult Note (Signed)
Renal Service Consult Note [facility]  Henry Carter 06/09/2019 Sol Blazing Requesting Physician:  Dr Marlowe Sax  Reason for Consult:  AKI, hematuria HPI: The patient is a 79 y.o. year-old with hx of PAD, HL, HTN, COPD, BPH, anemia/ Fe def presents w/ AMS and decreased po intake. Not taking meds x 1 week, also fell and hit head, confused. Has been bleeding from his foley cath for last week. In ED Hb was low at 6.4, has rec'd 1u prbc. K+ 6.2, BUN 140, creat 10.7.  Baseline creat 2 wks ago was 1.06 on 2/08.  INR 2.6.  CXR negative. Trop neg.  Abd CT is ordered and pending. Urology has seen.  Asked to see for AKI.    No prior admits here. Pt denies hx of kidney problems.  ROS  denies CP  no joint pain   no HA  no blurry vision  no rash  no diarrhea  no nausea/ vomiting    Past Medical History  Past Medical History:  Diagnosis Date  .  iron deficiency 01/18/2013  . Anemia   . Angioedema   . BPH (benign prostatic hyperplasia)   . COPD (chronic obstructive pulmonary disease) ([place])    STates he has copd  . Essential hypertension   . GERD (gastroesophageal reflux disease)   . Helicobacter pylori gastritis 10/10/2012  . Hyperlipidemia   . IBS (irritable bowel syndrome)   . Impotence   . Peripheral vascular disease, unspecified ([place])   . Personal history of colonic adenoma 09/22/2012  . Personal history of tobacco use, presenting hazards to health    Past Surgical History  Past Surgical History:  Procedure Laterality Date  . abdominal surg for ulcers'  1980  . COLONOSCOPY N/A 09/22/2012   Procedure: COLONOSCOPY;  Surgeon: Gatha Mayer, MD;  Location: [facility];  Service: Endoscopy;  Laterality: N/A;  . distal aortogram    . percutaneous transluminal angionplasty with placement of 2 self expanding stent in the distal left superfical femeral artery     Family History  Family History  Problem Relation Age of Onset  . Alcohol abuse Father   . Kidney  disease Brother   . Drug abuse Brother   . Diabetes Brother   . Diabetes Mother   . Diabetes Brother   . Colon cancer Neg Hx    Social History  reports that he quit smoking about 5 years ago. His smoking use included cigarettes. He started smoking about 62 years ago. He has a 12.50 pack-year smoking history. He has never used smokeless tobacco. He reports current drug use. He reports that he does not drink alcohol. Allergies  Allergies  Allergen Reactions  . Ace Inhibitors     REACTION: antioedema  . Penicillins Hives    Has patient had a PCN reaction causing immediate rash, facial/tongue/throat swelling, SOB or lightheadedness with hypotension: No Has patient had a PCN reaction causing severe rash involving mucus membranes or skin necrosis: No Has patient had a PCN reaction that required hospitalization yes Has patient had a PCN reaction occurring within the last 10 years: no If all of the above answers are "NO", then may proceed with Cephalosporin use.   Home medications Prior to Admission medications   Medication Sig Start Date End Date Taking? Authorizing Provider  clopidogrel (PLAVIX) 75 MG tablet Take 1 tablet (75 mg total) by mouth daily. 03/22/19  Yes [name], MD  rivaroxaban (XARELTO) 20 MG TABS tablet Take 1 tablet (20 mg  total) by mouth daily with supper. 05/18/19  Yes [name], MD  simvastatin (ZOCOR) 20 MG tablet Take 1 tablet (20 mg total) by mouth at bedtime. 07/13/18  Yes [name], MD  Rivaroxaban 15 & 20 MG TBPK Follow package directions: Take one 15mg  tablet by mouth twice a day. On day 22, switch to one 20mg  tablet once a day. Take with food. Patient not taking: Reported on 06/09/2019 05/11/19   Varney Biles, MD     Vitals:   06/09/19 2015 06/09/19 2030 06/09/19 2045 06/09/19 2100  BP: (!) 151/83 139/69 (!) 164/85 (!) 145/82  Pulse: (!) 111 98 (!) 113 (!) 104  Resp: 20 14 20  (!) 22  Temp:      TempSrc:      SpO2: 100% 100%  100% 100%  Weight:      Height:       Exam Gen thin AAM no distress, dry mouth No rash, cyanosis or gangrene Sclera anicteric, throat clear  No jvd or bruits Chest clear bilat to bases RRR no MRG Abd soft ntnd no mass or ascites +bs GU normal male w foley in place MS no joint effusions or deformity Ext no LE or UE edema, no wounds or ulcers  Neuro is alert, Ox 3 , nf, no asterixis    Home meds:  - clopidogrel 75 qd/ simvastatin 20 hs  - rivaroxaban 20 mg hs      Na 127  K 6.2  CO2 11  BUN 140  Cr 10.7  Ca 8.4  Alb 2.4  Hb 6.4          Assessment/ Plan: 1. Acute renal failure - obstruction w/ gross hematuria, pt w/ indwelling foley presumably for BPH/ urinary retention.  Creat 2 wks ago normal, now 10.  Pt sig dehydrated, and also bilat hydro by CT tonight. Urology is seeing patient to address obstruction  Agree w/ IVF's w/ bicarb gtt at 125 cc/hr, will bolus another 2L NS as well.  No sig uremic sx's, no indication for RRT.   2. Hyperkalemia - rec kayexalate x 1 now and Lokelma in 4-6 hrs, fix obstruction and renal fxn will return.   3. Vol depletion - sig, as above. Fortunately BP's are good.  4. BPH - has had indwelling foley on arrival here, pt not sure how long has been in 5. H/o recent DVT - recently started on blood thinners 6. COPD 7. H/o HTN - not on any BP meds 8. Anemia - got 1u prbcs in ED, per primary       Kelly Splinter  MD 06/09/2019, 9:09 PM  Recent Labs  Lab 06/09/19 1425  WBC 8.7  HGB 6.4*   Recent Labs  Lab 06/09/19 1425  K 6.2*  BUN 140*  CREATININE 10.79*  CALCIUM 8.4*

## 2019-06-09 NOTE — ED Notes (Signed)
Pt transported to CT ?

## 2019-06-09 NOTE — Consult Note (Addendum)
Consult: gross hematuria Requested by: Malvin Johns, MD  History of Present Illness: Mr. Lightner is a 79 yo male followed by Dr. Alyson Ingles for elevated PSA of 20, BPH and urinary retention. He had a prostate biopsy scheduled for 06/01/2019 but may not have kept that appointment. He has a follow-up scheduled 06/12/2019 at 2 pm. He was seen in office for gross hematuria 05/22/2019 and his foley was changed and irrigated (16Fr).    He presented today with a 9 day h/o of poor po intake and confusion. Also bleeding around foley. Hgb was 6.4 but is now 8.6 after a unit of blood. His Cr is usually around 1 and was 10.79. No upper tract imaging has been done and I recommended CT A/P to eval for upper tract lesions, clot in bladder, metastatic disease, hydro, etc. Pt did go for scan which revealed bilateral HUN down to a distended bladder with foley in place (not draining). Of note, no LAD or bone lesions.   Past Medical History:  Diagnosis Date  .  iron deficiency 01/18/2013  . Anemia   . Angioedema   . BPH (benign prostatic hyperplasia)   . COPD (chronic obstructive pulmonary disease) ([place])    STates he has copd  . Essential hypertension   . GERD (gastroesophageal reflux disease)   . Helicobacter pylori gastritis 10/10/2012  . Hyperlipidemia   . IBS (irritable bowel syndrome)   . Impotence   . Peripheral vascular disease, unspecified ([place])   . Personal history of colonic adenoma 09/22/2012  . Personal history of tobacco use, presenting hazards to health    Past Surgical History:  Procedure Laterality Date  . abdominal surg for ulcers'  1980  . COLONOSCOPY N/A 09/22/2012   Procedure: COLONOSCOPY;  Surgeon: Gatha Mayer, MD;  Location: [facility];  Service: Endoscopy;  Laterality: N/A;  . distal aortogram    . percutaneous transluminal angionplasty with placement of 2 self expanding stent in the distal left superfical femeral artery      Home Medications:  (Not in a hospital  admission)  Allergies:  Allergies  Allergen Reactions  . Ace Inhibitors     REACTION: antioedema  . Penicillins Hives    Has patient had a PCN reaction causing immediate rash, facial/tongue/throat swelling, SOB or lightheadedness with hypotension: No Has patient had a PCN reaction causing severe rash involving mucus membranes or skin necrosis: No Has patient had a PCN reaction that required hospitalization yes Has patient had a PCN reaction occurring within the last 10 years: no If all of the above answers are "NO", then may proceed with Cephalosporin use.    Family History  Problem Relation Age of Onset  . Alcohol abuse Father   . Kidney disease Brother   . Drug abuse Brother   . Diabetes Brother   . Diabetes Mother   . Diabetes Brother   . Colon cancer Neg Hx    Social History:  reports that he quit smoking about 5 years ago. His smoking use included cigarettes. He started smoking about 62 years ago. He has a 12.50 pack-year smoking history. He has never used smokeless tobacco. He reports current drug use. He reports that he does not drink alcohol.  ROS: A complete review of systems was performed.  All systems are negative except for pertinent findings as noted. Review of Systems  Constitutional: Positive for chills and malaise/fatigue.  All other systems reviewed and are negative.    Physical Exam:  Vital signs in last 24  hours: Temp:  [97.6 F (36.4 C)-98 F (36.7 C)] 97.9 F (36.6 C) (02/27 1922) Pulse Rate:  [97-113] 104 (02/27 2100) Resp:  [12-27] 22 (02/27 2100) BP: (119-164)/(69-99) 145/82 (02/27 2100) SpO2:  [99 %-100 %] 100 % (02/27 2100) Weight:  [68 kg] 68 kg (02/27 1412) General:  Alert and oriented, No acute distress, cachetic  HEENT: Normocephalic, atraumatic Cardiovascular: Regular rate and rhythm Lungs: Regular rate and effort Abdomen: Soft, nontender, nondistended, no abdominal masses after foley placed, bladder was distended prior Extremities: No  edema Neurologic: Grossly intact GU-penis uncircumcised, foreskin normal, 16Fr foley in bladder, dirty, crusty, not draining, bleeding around it   Procedure -the 16 French Foley balloon was deflated and removed.  The 16 French catheter was crusty and occluded at the tip.  He was prepped and a 22Fr coude placed.  1100 cc of maroon urine drained.  No clots.  I then irrigated the bladder with a liter of water and it cleared to a very pale pink.  No clots.  Laboratory Data:  Results for orders placed or performed during the hospital encounter of 06/09/19 (from the past 24 hour(s))  Comprehensive metabolic panel     Status: Abnormal   Collection Time: 06/09/19  2:25 PM  Result Value Ref Range   Sodium 127 (L) 135 - 145 mmol/L   Potassium 6.2 (H) 3.5 - 5.1 mmol/L   Chloride 97 (L) 98 - 111 mmol/L   CO2 11 (L) 22 - 32 mmol/L   Glucose, Bld 129 (H) 70 - 99 mg/dL   BUN 140 (H) 8 - 23 mg/dL   Creatinine, Ser 10.79 (H) 0.61 - 1.24 mg/dL   Calcium 8.4 (L) 8.9 - 10.3 mg/dL   Total Protein 5.6 (L) 6.5 - 8.1 g/dL   Albumin 2.4 (L) 3.5 - 5.0 g/dL   AST 23 15 - 41 U/L   ALT 25 0 - 44 U/L   Alkaline Phosphatase 66 38 - 126 U/L   Total Bilirubin 1.0 0.3 - 1.2 mg/dL   GFR calc non Af Amer 4 (L) >60 mL/min   GFR calc Af Amer 5 (L) >60 mL/min   Anion gap 19 (H) 5 - 15  Lactic acid, plasma     Status: Abnormal   Collection Time: 06/09/19  2:25 PM  Result Value Ref Range   Lactic Acid, Venous 2.2 (HH) 0.5 - 1.9 mmol/L  CBC with Differential     Status: Abnormal   Collection Time: 06/09/19  2:25 PM  Result Value Ref Range   WBC 8.7 4.0 - 10.5 K/uL   RBC 2.20 (L) 4.22 - 5.81 MIL/uL   Hemoglobin 6.4 (LL) 13.0 - 17.0 g/dL   HCT 19.5 (L) 39.0 - 52.0 %   MCV 88.6 80.0 - 100.0 fL   MCH 29.1 26.0 - 34.0 pg   MCHC 32.8 30.0 - 36.0 g/dL   RDW 16.6 (H) 11.5 - 15.5 %   Platelets 343 150 - 400 K/uL   nRBC 0.5 (H) 0.0 - 0.2 %   Neutrophils Relative % 88 %   Neutro Abs 7.7 1.7 - 7.7 K/uL   Lymphocytes  Relative 3 %   Lymphs Abs 0.2 (L) 0.7 - 4.0 K/uL   Monocytes Relative 8 %   Monocytes Absolute 0.7 0.1 - 1.0 K/uL   Eosinophils Relative 0 %   Eosinophils Absolute 0.0 0.0 - 0.5 K/uL   Basophils Relative 0 %   Basophils Absolute 0.0 0.0 - 0.1 K/uL   Immature Granulocytes  1 %   Abs Immature Granulocytes 0.04 0.00 - 0.07 K/uL  Protime-INR     Status: Abnormal   Collection Time: 06/09/19  2:25 PM  Result Value Ref Range   Prothrombin Time 28.1 (H) 11.4 - 15.2 seconds   INR 2.6 (H) 0.8 - 1.2  Troponin I (High Sensitivity)     Status: None   Collection Time: 06/09/19  2:25 PM  Result Value Ref Range   Troponin I (High Sensitivity) 14 <18 ng/L  Type and screen [facility]     Status: None (Preliminary result)   Collection Time: 06/09/19  2:25 PM  Result Value Ref Range   ABO/RH(D) O POS    Antibody Screen NEG    Sample Expiration      06/12/2019,2359 Performed at [facility], [place] [address], [place], [place] [address]    Unit Number [id number]    Blood Component Type RED CELLS,LR    Unit division 00    Status of Unit ISSUED    Transfusion Status OK TO TRANSFUSE    Crossmatch Result Compatible    Unit Number [id number]    Blood Component Type RED CELLS,LR    Unit division 00    Status of Unit ALLOCATED    Transfusion Status OK TO TRANSFUSE    Crossmatch Result Compatible    Unit Number [id number]    Blood Component Type RED CELLS,LR    Unit division 00    Status of Unit ALLOCATED    Transfusion Status OK TO TRANSFUSE    Crossmatch Result Compatible   CK     Status: None   Collection Time: 06/09/19  2:25 PM  Result Value Ref Range   Total CK 81 49 - 397 U/L  ABO/Rh     Status: None   Collection Time: 06/09/19  2:25 PM  Result Value Ref Range   ABO/RH(D)      O POS Performed at [facility], [place] [address] [place], [place] [address]   Prepare RBC     Status: None   Collection Time: 06/09/19  3:24 PM  Result Value Ref Range    Order Confirmation      ORDER PROCESSED BY BLOOD BANK Performed at [facility], [place] [address] [place], [place] [address]   POC occult blood, ED     Status: Abnormal   Collection Time: 06/09/19  3:25 PM  Result Value Ref Range   Fecal Occult Bld POSITIVE (A) NEGATIVE  POC SARS Coronavirus 2 Ag-ED - Nasal Swab (BD Veritor Kit)     Status: None   Collection Time: 06/09/19  3:32 PM  Result Value Ref Range   SARS Coronavirus 2 Ag NEGATIVE NEGATIVE  Urinalysis, Routine w reflex microscopic     Status: Abnormal   Collection Time: 06/09/19  6:58 PM  Result Value Ref Range   Color, Urine BROWN (A) YELLOW   APPearance TURBID (A) CLEAR   Specific Gravity, Urine  1.005 - 1.030    TEST NOT REPORTED DUE TO COLOR INTERFERENCE OF URINE PIGMENT   pH  5.0 - 8.0    TEST NOT REPORTED DUE TO COLOR INTERFERENCE OF URINE PIGMENT   Glucose, UA (A) NEGATIVE mg/dL    TEST NOT REPORTED DUE TO COLOR INTERFERENCE OF URINE PIGMENT   Hgb urine dipstick (A) NEGATIVE    TEST NOT REPORTED DUE TO COLOR INTERFERENCE OF URINE PIGMENT   Bilirubin Urine (A) NEGATIVE    TEST NOT  REPORTED DUE TO COLOR INTERFERENCE OF URINE PIGMENT   Ketones, ur (A) NEGATIVE mg/dL    TEST NOT REPORTED DUE TO COLOR INTERFERENCE OF URINE PIGMENT   Protein, ur (A) NEGATIVE mg/dL    TEST NOT REPORTED DUE TO COLOR INTERFERENCE OF URINE PIGMENT   Nitrite (A) NEGATIVE    TEST NOT REPORTED DUE TO COLOR INTERFERENCE OF URINE PIGMENT   Leukocytes,Ua (A) NEGATIVE    TEST NOT REPORTED DUE TO COLOR INTERFERENCE OF URINE PIGMENT  Urinalysis, Microscopic (reflex)     Status: Abnormal   Collection Time: 06/09/19  6:58 PM  Result Value Ref Range   RBC / HPF >50 0 - 5 RBC/hpf   WBC, UA 0-5 0 - 5 WBC/hpf   Bacteria, UA MANY (A) NONE SEEN   Squamous Epithelial / LPF 0-5 0 - 5   Triple Phosphate Crystal PRESENT    Urine-Other MICROSCOPIC EXAM PERFORMED ON UNCONCENTRATED URINE    No results found for this or any previous visit (from the past  240 hour(s)). Creatinine: Recent Labs    06/09/19 1425  CREATININE 10.79*    Impression/Assessment/plan: #1 gross hematuria-likely related to occluded Foley, bladder distention, chronic Foley etc.  CT scan confirmed prior foley not working.  #2 acute kidney injury-multifactorial with decreased p.o. intake and no drainage of the bladder.  Foley now draining.  Hopefully he will diurese.  CT scan showed hydronephrosis which should improve with new foley now that bladder is decompressed.  #3 elevated PSA-CT scan showed no metastatic disease.  #4 urinary retention - new foley placed.   I will notify Dr. Alyson Ingles of admission.  Will follow.   Festus Aloe 06/09/2019, 9:14 PM

## 2019-06-10 DIAGNOSIS — I739 Peripheral vascular disease, unspecified: Secondary | ICD-10-CM

## 2019-06-10 DIAGNOSIS — I714 Abdominal aortic aneurysm, without rupture: Secondary | ICD-10-CM

## 2019-06-10 DIAGNOSIS — I82402 Acute embolism and thrombosis of unspecified deep veins of left lower extremity: Secondary | ICD-10-CM

## 2019-06-10 LAB — BASIC METABOLIC PANEL WITH GFR
Anion gap: 12 (ref 5–15)
BUN: 62 mg/dL — ABNORMAL HIGH (ref 8–23)
CO2: 22 mmol/L (ref 22–32)
Calcium: 8 mg/dL — ABNORMAL LOW (ref 8.9–10.3)
Chloride: 105 mmol/L (ref 98–111)
Creatinine, Ser: 3.21 mg/dL — ABNORMAL HIGH (ref 0.61–1.24)
GFR calc Af Amer: 20 mL/min — ABNORMAL LOW
GFR calc non Af Amer: 18 mL/min — ABNORMAL LOW
Glucose, Bld: 127 mg/dL — ABNORMAL HIGH (ref 70–99)
Potassium: 2.9 mmol/L — ABNORMAL LOW (ref 3.5–5.1)
Sodium: 139 mmol/L (ref 135–145)

## 2019-06-10 LAB — BASIC METABOLIC PANEL
Anion gap: 15 (ref 5–15)
BUN: 103 mg/dL — ABNORMAL HIGH (ref 8–23)
CO2: 14 mmol/L — ABNORMAL LOW (ref 22–32)
Calcium: 8.6 mg/dL — ABNORMAL LOW (ref 8.9–10.3)
Chloride: 106 mmol/L (ref 98–111)
Creatinine, Ser: 6.36 mg/dL — ABNORMAL HIGH (ref 0.61–1.24)
GFR calc Af Amer: 9 mL/min — ABNORMAL LOW (ref >60)
GFR calc non Af Amer: 8 mL/min — ABNORMAL LOW (ref >60)
Glucose, Bld: 81 mg/dL (ref 70–99)
Potassium: 4 mmol/L (ref 3.5–5.1)
Sodium: 135 mmol/L (ref 135–145)

## 2019-06-10 LAB — CBC
HCT: 19.8 % — ABNORMAL LOW (ref 39.0–52.0)
Hemoglobin: 6.9 g/dL — CL (ref 13.0–17.0)
MCH: 29.4 pg (ref 26.0–34.0)
MCHC: 34.8 g/dL (ref 30.0–36.0)
MCV: 84.3 fL (ref 80.0–100.0)
Platelets: 337 10*3/uL (ref 150–400)
RBC: 2.35 MIL/uL — ABNORMAL LOW (ref 4.22–5.81)
RDW: 15.6 % — ABNORMAL HIGH (ref 11.5–15.5)
WBC: 8.4 10*3/uL (ref 4.0–10.5)
nRBC: 0 % (ref 0.0–0.2)

## 2019-06-10 LAB — PREPARE RBC (CROSSMATCH)

## 2019-06-10 LAB — HEMOGLOBIN AND HEMATOCRIT, BLOOD
HCT: 24.2 % — ABNORMAL LOW (ref 39.0–52.0)
HCT: 22.5 % — ABNORMAL LOW (ref 39.0–52.0)
Hemoglobin: 8.2 g/dL — ABNORMAL LOW (ref 13.0–17.0)
Hemoglobin: 7.8 g/dL — ABNORMAL LOW (ref 13.0–17.0)

## 2019-06-10 LAB — SARS CORONAVIRUS 2 (TAT 6-24 HRS): SARS Coronavirus 2: NEGATIVE

## 2019-06-10 LAB — IRON AND TIBC
Iron: 12 ug/dL — ABNORMAL LOW (ref 45–182)
Saturation Ratios: 4 % — ABNORMAL LOW (ref 17.9–39.5)
TIBC: 301 ug/dL (ref 250–450)
UIBC: 289 ug/dL

## 2019-06-10 LAB — FERRITIN: Ferritin: 86 ng/mL (ref 24–336)

## 2019-06-10 MED ORDER — CHLORHEXIDINE GLUCONATE CLOTH 2 % EX PADS
6.0000 | MEDICATED_PAD | Freq: Every day | CUTANEOUS | Status: DC
  Administered 2019-06-10 – 2019-06-14 (×5): 6 via TOPICAL

## 2019-06-10 MED ORDER — POTASSIUM CHLORIDE CRYS ER 20 MEQ PO TBCR
40.0000 meq | EXTENDED_RELEASE_TABLET | ORAL | Status: AC
  Administered 2019-06-10 (×2): 40 meq via ORAL
  Filled 2019-06-10 (×2): qty 2

## 2019-06-10 MED ORDER — SODIUM CHLORIDE 0.9% IV SOLUTION: Freq: Once | INTRAVENOUS | Status: AC

## 2019-06-10 NOTE — Progress Notes (Signed)
[facility] Progress Note  Subjective: creat down 6, good UOP, foley replaced by urology  Vitals:   06/09/19 2230 06/09/19 2300 06/10/19 0357 06/10/19 0752  BP: 136/85 (!) 145/88 (!) 154/86 (!) 149/83  Pulse: (!) 111  92 99  Resp: 17 17 18 20   Temp:  98.1 F (36.7 C) 97.6 F (36.4 C) 97.6 F (36.4 C)  TempSrc:  Oral Oral   SpO2: 100% 100% 100% 100%  Weight:      Height:        Exam: Gen thin AAM stable No rash, cyanosis or gangrene Sclera anicteric, throat clear  No jvd or bruits Chest clear bilat to bases RRR no MRG Abd soft ntnd no mass or ascites Ext no LE or UE edema Neuro is alert, Ox 3 , nf, no asterixis    Home meds:  - clopidogrel 75 qd/ simvastatin 20 hs  - rivaroxaban 20 mg hs      UA - not helpful as too bloody   CT abd noncon 2/27 > bilat hydroneph/ ureter w/ dist bladder        Assessment/ Plan: 1. Acute renal failure - normal creat 2 wks ago 0.9. SP recent OP foley placement for BPH/ ^psa , awaiting pros bx, presented w/ obstructed foley, distended bladder/ bilat hydro by CT and severe AKI.  Urol replaced foley and UOP very good, creat down 10 > 6 this am. Pt alert and stable, cont IVF"s. Don't expect any further renal issues, will sign off. Please call as needed.  2. H/o recent DVT - recently started on blood thinners 3. H/o HTN - not on any BP meds 4. Anemia - got 1u prbcs in ED, per primary         Rob Ulonda Klosowski 06/10/2019, 12:00 PM   Recent Labs  Lab 06/09/19 2137 06/09/19 2137 06/10/19 0042 06/10/19 0525  K 6.4*  --   --  4.0  BUN 143*  --   --  103*  CREATININE 10.90*  --   --  6.36*  CALCIUM 8.6*  --   --  8.6*  HGB 8.6*   < > 8.2* 7.8*   < > = values in this interval not displayed.   Inpatient medications: . Chlorhexidine Gluconate Cloth  6 each Topical Daily  . pantoprazole  40 mg Intravenous Q12H   .  sodium bicarbonate (isotonic) infusion in sterile water 125 mL/hr at 06/10/19 1108   acetaminophen  **OR** acetaminophen, morphine injection

## 2019-06-10 NOTE — Consult Note (Signed)
Hospital Consult    Reason for Consult:  ivc filter Referring Physician:  Dr. Doristine Bosworth MRN #:  [id number]  History of Present Illness: This is a 79 y.o. male recently found to have extensive left lower extremity DVT in January.  Also has history of iron deficiency anemia.  Patient was brought to the emergency department yesterday with several day history of failure to thrive.  Was recently admitted with gross hematuria with a chronic indwelling Foley.  Has multifactorial acute kidney injury on admission.  Urology has evaluated.  Patient is awake but states he is confused most history taken from chart and from his wife at the bedside.   Previously was followed in our office for bilateral lower extremity endovascular revascularization 10 years ago.  Also was found to have abdominal aortic aneurysm on this admission.  Past Medical History:  Diagnosis Date  .  iron deficiency 01/18/2013  . Anemia   . Angioedema   . BPH (benign prostatic hyperplasia)   . COPD (chronic obstructive pulmonary disease) ([place])    STates he has copd  . Essential hypertension   . GERD (gastroesophageal reflux disease)   . Helicobacter pylori gastritis 10/10/2012  . Hyperlipidemia   . IBS (irritable bowel syndrome)   . Impotence   . Peripheral vascular disease, unspecified ([place])   . Personal history of colonic adenoma 09/22/2012  . Personal history of tobacco use, presenting hazards to health     Past Surgical History:  Procedure Laterality Date  . abdominal surg for ulcers'  1980  . COLONOSCOPY N/A 09/22/2012   Procedure: COLONOSCOPY;  Surgeon: Gatha Mayer, MD;  Location: [facility];  Service: Endoscopy;  Laterality: N/A;  . distal aortogram    . percutaneous transluminal angionplasty with placement of 2 self expanding stent in the distal left superfical femeral artery      Allergies  Allergen Reactions  . Ace Inhibitors     REACTION: antioedema  . Penicillins Hives    Has patient had a PCN reaction  causing immediate rash, facial/tongue/throat swelling, SOB or lightheadedness with hypotension: No Has patient had a PCN reaction causing severe rash involving mucus membranes or skin necrosis: No Has patient had a PCN reaction that required hospitalization yes Has patient had a PCN reaction occurring within the last 10 years: no If all of the above answers are "NO", then may proceed with Cephalosporin use.    Prior to Admission medications   Medication Sig Start Date End Date Taking? Authorizing Provider  clopidogrel (PLAVIX) 75 MG tablet Take 1 tablet (75 mg total) by mouth daily. 03/22/19  Yes [name], MD  rivaroxaban (XARELTO) 20 MG TABS tablet Take 1 tablet (20 mg total) by mouth daily with supper. 05/18/19  Yes [name], MD  simvastatin (ZOCOR) 20 MG tablet Take 1 tablet (20 mg total) by mouth at bedtime. 07/13/18  Yes [name], MD  Rivaroxaban 15 & 20 MG TBPK Follow package directions: Take one 15mg  tablet by mouth twice a day. On day 22, switch to one 20mg  tablet once a day. Take with food. Patient not taking: Reported on 06/09/2019 05/11/19   Varney Biles, MD    Social History   Socioeconomic History  . Marital status: Married    Spouse name: Not on file  . Number of children: 4  . Years of education: Not on file  . Highest education level: Not on file  Occupational History  . Not on file  Tobacco Use  .  Smoking status: Former Smoker    Packs/day: 0.25    Years: 50.00    Pack years: 12.50    Types: Cigarettes    Start date: 04/12/1957    Quit date: 04/12/2014    Years since quitting: 5.1  . Smokeless tobacco: Never Used  . Tobacco comment: using chatix; stopped now; took x 1 week / had stopped 3 years prior to starting back in 2005  Substance and Sexual Activity  . Alcohol use: No    Alcohol/week: 0.0 standard drinks    Comment: OCC  . Drug use: Yes    Comment: States marijuanna relaxes him;   . Sexual activity: Not Currently     Comment: marijuanna  Other Topics Concern  . 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 Difficulty of Paying Living Expenses: Not on file  Food Insecurity:   . Worried About Charity fundraiser in the Last Year: Not on file  . Ran Out of Food in the Last Year: Not on file  Transportation Needs:   . Lack of Transportation (Medical): Not on file  . Lack of Transportation (Non-Medical): Not on file  Physical Activity:   . Days of Exercise per Week: Not on file  . Minutes of Exercise per Session: Not on file  Stress:   . Feeling of Stress : Not on file  Social Connections:   . Frequency of Communication with Friends and Family: Not on file  . Frequency of Social Gatherings with Friends and Family: Not on file  . Attends Religious Services: Not on file  . Active Member of Clubs or Organizations: Not on file  . Attends Archivist Meetings: Not on file  . Marital Status: Not on file  Intimate Partner Violence:   . Fear of Current or Ex-Partner: Not on file  . Emotionally Abused: Not on file  . Physically Abused: Not on file  . Sexually Abused: Not on file     Family History  Problem Relation Age of Onset  . Alcohol abuse Father   . Kidney disease Brother   . Drug abuse Brother   . Diabetes Brother   . Diabetes Mother   . Diabetes Brother   . Colon cancer Neg Hx     ROS:  Cardiovascular: []  chest pain/pressure []  palpitations []  SOB lying flat []  DOE []  pain in legs while walking []  pain in legs at rest []  pain in legs at night []  non-healing ulcers [x]  hx of DVT [x]  swelling in legs  Pulmonary: []  productive cough []  asthma/wheezing []  home O2  Neurologic: [x]  weakness in []  arms []  legs []  numbness in []  arms []  legs []  hx of CVA []  mini stroke [] difficulty  speaking or slurred speech []  temporary loss of vision in one eye []  dizziness  Hematologic: []  hx of cancer [x]  bleeding problems []  problems with blood clotting easily  Endocrine:   []  diabetes []  thyroid disease  GI []  vomiting blood []  blood in stool  GU: []  CKD/renal failure []  HD--[]  M/W/F or []  T/T/S []  burning with urination [x]  blood in urine  Psychiatric: []  anxiety []  depression  Musculoskeletal: []  arthritis []  joint pain  Integumentary: []  rashes []  ulcers  Constitutional: []  fever []  chills   Physical Examination  Vitals:   06/10/19 0357 06/10/19 0752  BP: (!) 154/86 (!) 149/83  Pulse: 92 99  Resp: 18 20  Temp: 97.6 F (36.4 C) 97.6 F (36.4 C)  SpO2: 100% 100%   Body mass index is 20.92 kg/m.  General:  nad HENT: WNL, normocephalic Pulmonary: normal non-labored breathing Cardiac: Weakly palpable femoral pulses bilaterally Abdomen:  soft, NT/ND, no masses Extremities: Left lower extremity nonpitting edema Neurologic: Patient is awake and alert  CBC    Component Value Date/Time   WBC 8.7 06/09/2019 1425   RBC 2.20 (L) 06/09/2019 1425   HGB 7.8 (L) 06/10/2019 0525   HCT 22.5 (L) 06/10/2019 0525   PLT 343 06/09/2019 1425   MCV 88.6 06/09/2019 1425   MCH 29.1 06/09/2019 1425   MCHC 32.8 06/09/2019 1425   RDW 16.6 (H) 06/09/2019 1425   LYMPHSABS 0.2 (L) 06/09/2019 1425   MONOABS 0.7 06/09/2019 1425   EOSABS 0.0 06/09/2019 1425   BASOSABS 0.0 06/09/2019 1425    BMET    Component Value Date/Time   NA 135 06/10/2019 0525   K 4.0 06/10/2019 0525   CL 106 06/10/2019 0525   CO2 14 (L) 06/10/2019 0525   GLUCOSE 81 06/10/2019 0525   BUN 103 (H) 06/10/2019 0525   CREATININE 6.36 (H) 06/10/2019 0525   CALCIUM 8.6 (L) 06/10/2019 0525   GFRNONAA 8 (L) 06/10/2019 0525   GFRAA 9 (L) 06/10/2019 0525    COAGS: Lab Results  Component Value Date   INR 2.6 (H) 06/09/2019   INR 0.92 01/13/2015   INR 0.9 ratio 01/27/2009      Non-Invasive Vascular Imaging:   Lower extremity duplex Right  : No evidence of common femoral vein obstruction.  Left: Findings consistent with acute deep vein thrombosis involving the  left common femoral vein, left femoral vein, left popliteal vein, left  posterior tibial veins, and left peroneal veins. No cystic structure found  in the popliteal fossa.      ASSESSMENT/PLAN: This is a 79 y.o. male here with gross hematuria while on anticoagulation for extensive left lower extremity DVT.  I discussed the risk benefits alternatives of proceeding with filter placement with the wife and we will plan for tomorrow.  N.p.o. past midnight and consent ordered.  Patient does have previous lower extremity revascularization and also now has 4 cm abdominal aortic aneurysm which will need to be followed as an outpatient.  Gevork Ayyad C. Donzetta Matters, MD [facility] Office: [phone] Pager: [phone]

## 2019-06-10 NOTE — Progress Notes (Signed)
Subjective: Patient reports he is feeling much better.   Objective: Vital signs in last 24 hours: Temp:  [97.6 F (36.4 C)-99.1 F (37.3 C)] 98.3 F (36.8 C) (02/28 1635) Pulse Rate:  [63-121] 63 (02/28 1635) Resp:  [14-22] 16 (02/28 1635) BP: (104-164)/(56-88) 104/56 (02/28 1635) SpO2:  [99 %-100 %] 100 % (02/28 1634)  Intake/Output from previous day: 02/27 0701 - 02/28 0700 In: 1915 [I.V.:1100; Blood:315; IV Piggyback:500] Out: 1900 [Urine:1900] Intake/Output this shift: No intake/output data recorded.   Intake/Output Summary (Last 24 hours) at 06/10/2019 1923 Last data filed at 06/10/2019 1800 Gross per 24 hour  Intake 1000 ml  Output 5600 ml  Net -4600 ml     Physical Exam:  Watching TV,  Urine light pink in tubing.   Lab Results: Recent Labs    06/10/19 0042 06/10/19 0525 06/10/19 1810  HGB 8.2* 7.8* 6.9*  HCT 24.2* 22.5* 19.8*   BMET Recent Labs    06/10/19 0525 06/10/19 1549  NA 135 139  K 4.0 2.9*  CL 106 105  CO2 14* 22  GLUCOSE 81 127*  BUN 103* 62*  CREATININE 6.36* 3.21*  CALCIUM 8.6* 8.0*   Recent Labs    06/09/19 1425  INR 2.6*   No results for input(s): LABURIN in the last 72 hours. Results for orders placed or performed during the hospital encounter of 06/09/19  Culture, blood (Routine x 2)     Status: None (Preliminary result)   Collection Time: 06/09/19  2:25 PM   Specimen: BLOOD  Result Value Ref Range Status   Specimen Description BLOOD RIGHT ANTECUBITAL  Final   Special Requests   Final    BOTTLES DRAWN AEROBIC AND ANAEROBIC Blood Culture adequate volume   Culture   Final    NO GROWTH < 24 HOURS Performed at [facility], [address] [place], [place] [address]    Report Status PENDING  Incomplete  Culture, blood (Routine x 2)     Status: None (Preliminary result)   Collection Time: 06/09/19  2:44 PM   Specimen: BLOOD  Result Value Ref Range Status   Specimen Description BLOOD LEFT ANTECUBITAL  Final    Special Requests   Final    BOTTLES DRAWN AEROBIC AND ANAEROBIC Blood Culture adequate volume   Culture  Setup Time   Final    GRAM POSITIVE COCCI IN CLUSTERS AEROBIC BOTTLE ONLY CRITICAL RESULT CALLED TO, READ BACK BY AND VERIFIED WITH: [place] [id number] AT 1456 BY CM    Culture   Final    NO GROWTH < 24 HOURS Performed at [facility], [place] [address] [place], [place] [address]    Report Status PENDING  Incomplete  SARS CORONAVIRUS 2 (TAT 6-24 HRS) Nasopharyngeal Nasopharyngeal Swab     Status: None   Collection Time: 06/09/19  7:39 PM   Specimen: Nasopharyngeal Swab  Result Value Ref Range Status   SARS Coronavirus 2 NEGATIVE NEGATIVE Final    Comment: (NOTE) SARS-CoV-2 target nucleic acids are NOT DETECTED. The SARS-CoV-2 RNA is generally detectable in upper and lower respiratory specimens during the acute phase of infection. Negative results do not preclude SARS-CoV-2 infection, do not rule out co-infections with other pathogens, and should not be used as the sole basis for treatment or other patient management decisions. Negative results must be combined with clinical observations, patient history, and epidemiological information. The expected result is Negative. Fact Sheet for Patients: SugarRoll.be Fact Sheet for Healthcare Providers: https://www.woods-mathews.com/  This test is not yet approved or cleared by the [place] and  has been authorized for detection and/or diagnosis of SARS-CoV-2 by FDA under an Emergency Use Authorization (EUA). This EUA will remain  in effect (meaning this test can be used) for the duration of the COVID-19 declaration under Section 56 4(b)(1) of the Act, 21 U.S.C. section 360bbb-3(b)(1), unless the authorization is terminated or revoked sooner. Performed at [facility], [place] [address] [place], [place] [address]     Studies/Results: CT ABDOMEN PELVIS WO  CONTRAST  Result Date: 06/09/2019 CLINICAL DATA:  Gross hematuria EXAM: CT ABDOMEN AND PELVIS WITHOUT CONTRAST TECHNIQUE: Multidetector CT imaging of the abdomen and pelvis was performed following the standard protocol without IV contrast. COMPARISON:  None. FINDINGS: Lower chest: No acute pleural or parenchymal lung disease. Hepatobiliary: Gallbladder is decompressed which limits its evaluation. Unenhanced imaging of the liver is unremarkable. Pancreas: Unremarkable. No pancreatic ductal dilatation or surrounding inflammatory changes. Spleen: Normal in size without focal abnormality. Adrenals/Urinary Tract: There is bilateral obstructive uropathy, left greater than right. I do not see an obstructing calculus. Distension of the ureters and renal collecting systems could be due to chronic bladder outlet obstruction given the degree of bladder distention on this exam. Diffuse mural calcifications are seen throughout the bladder. No definite bladder wall mass or filling defect. Foley catheter is seen within the bladder lumen. The adrenals are unremarkable. Stomach/Bowel: No bowel obstruction or ileus. Normal appendix right lower quadrant. No wall thickening or inflammatory changes. Vascular/Lymphatic: There is aneurysmal dilatation of the infrarenal abdominal aorta, measuring up to 4 cm in anterior-posterior dimension. Diffuse calcified atheromatous plaque throughout the aorta and its branches. No pathologic adenopathy within the abdomen or pelvis. Reproductive: Prostate is enlarged, measuring 5.4 x 5.4 cm in transverse direction. Other: There is trace free fluid in the pelvis, nonspecific. Perirectal fat stranding is noted, also nonspecific. Musculoskeletal: No acute or destructive bony lesions. Reconstructed images demonstrate no additional findings. IMPRESSION: 1. Bilateral obstructive uropathy, left greater than right, which may be due to bladder distension and chronic bladder outlet obstruction. No evidence of  urinary tract calculi. 2. Distended bladder despite Foley catheter. Please correlate with catheter function. Diffuse mural calcifications of the bladder without filling defect or mass. 3. Enlarged prostate. 4. 4 cm infrarenal abdominal aortic aneurysm. Recommend followup by ultrasound in 1 year. This recommendation follows ACR consensus guidelines: White Paper of the ACR Incidental Findings Committee II on Vascular Findings. J Am Coll Radiol 2013; 10:789-794. Aortic aneurysm NOS (ICD10-I71.9) 5. Trace pelvic free fluid. 6. Perirectal fat stranding of uncertain significance. Electronically Signed   By: Randa Ngo M.D.   On: 06/09/2019 21:29   CT Head Wo Contrast  Result Date: 06/09/2019 CLINICAL DATA:  Urinary tract infection, anorexia EXAM: CT HEAD WITHOUT CONTRAST TECHNIQUE: Contiguous axial images were obtained from the base of the skull through the vertex without intravenous contrast. COMPARISON:  12/30/2017 FINDINGS: Brain: Hypodensities throughout the periventricular white matter compatible with stable chronic small vessel ischemic changes. No signs of acute infarct or hemorrhage. Stable prominence of the lateral ventricles. Remaining midline structures are unremarkable. No acute extra-axial fluid collections. No mass effect. Vascular: No hyperdense vessel or unexpected calcification. Skull: Normal. Negative for fracture or focal lesion. Sinuses/Orbits: No acute finding. Other: None IMPRESSION: 1. Stable chronic small vessel ischemic changes in the white matter. No acute intracranial process. 2. Stable prominence of the lateral ventricles out of proportion to the degree of cerebral atrophy, compatible  with normal pressure hydrocephalus. Electronically Signed   By: Randa Ngo M.D.   On: 06/09/2019 19:29   DG Chest Portable 1 View  Result Date: 06/09/2019 CLINICAL DATA:  79 year old male with sepsis. EXAM: PORTABLE CHEST 1 VIEW COMPARISON:  Chest radiograph dated 01/13/2015 and CT dated  01/07/2012. FINDINGS: Background of emphysema. No focal consolidation, pleural effusion, pneumothorax. The cardiac silhouette is within normal limits. Atherosclerotic calcification of the aorta. No acute osseous pathology. Upper abdominal surgical suture. IMPRESSION: 1. No acute cardiopulmonary process. 2. Emphysema. Electronically Signed   By: Anner Crete M.D.   On: 06/09/2019 17:47    Assessment/Plan: #1 gross hematuria-resolved for the most part   #2 acute kidney injury-excellent UOP and response in creatinine with new foley.    #3 elevated PSA-CT scan showed no metastatic disease.   #4 urinary retention - new foley placed.   I will notify Dr. Alyson Ingles of admission.     LOS: 1 day   Festus Aloe 06/10/2019, 7:22 PM

## 2019-06-10 NOTE — Progress Notes (Signed)
PHARMACY - PHYSICIAN COMMUNICATION CRITICAL VALUE ALERT - BLOOD CULTURE IDENTIFICATION (BCID)  Henry Carter is an 79 y.o. male who presented to [facility] on 06/09/2019 with a chief complaint of hematuria.  Assessment:  1/4 GPC clusters (aerobic bottle) - no BCID  Name of physician (or Provider) Contacted: Dr. Doristine Bosworth  Current antibiotics: none  Changes to prescribed antibiotics recommended: No changes - may be contaminant.  Recommendations accepted by provider  No results found for this or any previous visit.  Vertis Kelch, PharmD, BCPS PGY2 Cardiology Pharmacy Resident Phone [phone] 06/10/2019       3:14 PM  Please check [email or web address] for unit-specific pharmacist phone numbers

## 2019-06-10 NOTE — Progress Notes (Signed)
PROGRESS NOTE    Henry Carter  [id number] DOB: May 25, 1940 DOA: 06/09/2019 PCP: [name], MD   Brief Narrative:  HPI: Henry Carter is a 79 y.o. male with medical history significant of iron deficiency anemia, BPH, COPD, hypertension, GERD, hyperlipidemia, PVD, history of DVT on anticoagulation, has a chronic indwelling foley catheter for BPH presenting to the ED for evaluation of altered mental status and decreased p.o. intake.  Patient's wife reported to ED provider that he has not been eating/drinking or taking his medications for the last 9 days.  He fell 2 days ago while getting out of bed and hit his head on the carpet.  No loss of consciousness reported.  He has been confused.  He has had bleeding from his Foley for the last 7 to 8 days.  Patient is confused and not sure why he is here.  His only complaint is suprapubic pain.  Denies chest pain, shortness of breath, nausea, vomiting, or diarrhea.  No additional history could be obtained from him.  ED Course: Afebrile.  Slightly tachycardic.  Labs showing no leukocytosis.  Lactic acid mildly elevated at 2.2.  Hemoglobin 6.4, was in the 10-11 range on recent labs.  FOBT positive.  No grossly bloody or melanotic stool noted on rectal exam.  Sodium 127, potassium 6.2, bicarb 11, anion gap 19, glucose 129.  BUN 140, creatinine 10.7.  Baseline creatinine 0.9.  LFTs normal.  Blood culture x2 pending.  High-sensitivity troponin negative.  CK normal.  SARS-CoV-2 rapid antigen test negative, PCR test pending.  UA uninterpretable due to gross hematuria. Microscopic examination showing greater than 50 RBCs, 0-5 WBCs, and many bacteria.  Urine culture pending. Chest x-ray showing emphysema and no active cardiopulmonary process. Head CT negative for acute intracranial abnormality.  Showing stable appearance of the lateral ventricles out of proportion to the degree of cerebral atrophy, compatible with normal pressure  hydrocephalus.  Patient received IV Protonix 40 mg and 1 L normal saline bolus.  1 unit PRBCs ordered.  Assessment & Plan:   Principal Problem:   Hematuria Active Problems:   Acute renal failure (ARF) (HCC)   Acute blood loss anemia   Hyperkalemia   Metabolic acidosis  Gross hematuria: Patient has a chronic indwelling Foley catheter in the setting of BPH.  Urology consulted.  Foley catheter was replaced.  CT abdomen pelvis showing bilateral obstructive uropathy, left greater than right, which may be due to bladder distention and chronic bladder outlet obstruction.  No evidence of urinary tract calculi.  Distended bladder and enlarged prostate.  Management deferred to urology.  Acute kidney injury/obstructive uropathy: Likely secondary to dehydration as well as obstructive uropathy due to blood clots.  Creatinine improving.  Good urine output.  Nephrology on board.  Appreciate their help.  Acute blood loss anemia: Secondary to gross hematuria.  Globin of 6.4.  Received 1 unit of PRBC transfusion.  Hemoglobin 7.8 currently.  Monitor every 12 hours.  Hyperkalemia: Received insulin/D50, Kayexalate and Lokelma.  Resolved.  Severe metabolic acidosis: Bicarb improving slowly.  Remains on bicarb drip.  Nephrology managing.  Appreciate their help.  Mild acute hyponatremia: Resolved.  Acute metabolic encephalopathy: Likely secondary to acute kidney injury.  Apparently, he is alert and slightly more oriented x1-2 but is still not back to baseline.  Positive FOBT: Suspicion that this may be contaminated from cross hematuria.  No history of hematochezia or black stool.  He received 1 dose of IV PPI in the ED.  Mild lactic acidosis:  Resolved.  Physical deconditioning/recent fall: PT OT seeing patient.  They recommend SNF.  History of normal pressure hydrocephalus: Head CT showing stable appearance of the lateral ventricles out of proportion to the degree of cerebral atrophy, compatible with  normal pressure hydrocephalus.  No acute issues.  No neurology consultation indicated.  Recently diagnosed DVT 05/11/2019:  Xarelto on hold.  With his gross hematuria causing acute blood loss anemia, I doubt that he will be candidate for anticoagulation anytime soon.  Called and discussed with vascular surgery, Dr. Donzetta Matters and he agrees that he might be a good candidate for IVC filter.  He will see in consultation.  Essential hypertension: Blood pressure stable.  Not on any antihypertensives at home.  AAA: CT showing a 4 cm infrarenal abdominal aortic aneurysm.  Radiologist recommending follow-up ultrasound in 1 year per ACR consensus guidelines.  DVT prophylaxis: SCD Code Status: Full code Family Communication:  None present at bedside.  Patient is from: Home Disposition Plan: To be determined Barriers to discharge: Clinical improvement/gross hematuria/acute metabolic encephalopathy   Estimated body mass index is 20.92 kg/m as calculated from the following:   Height as of this encounter: 5\' 11"  (1.803 m).   Weight as of this encounter: 68 kg.      Nutritional status:               Consultants:   Urology and nephrology  Procedures:   None  Antimicrobials:   None   Subjective: Patient seen and examined.  He was sitting at the edge of bed and just finished working with PT.  Complained of weakness.  He told me he lives with his wife and that he is at [facility].  He was unable to answer other questions regarding orientation and it took him a long time to answer those questions to.  Objective: Vitals:   06/09/19 2230 06/09/19 2300 06/10/19 0357 06/10/19 0752  BP: 136/85 (!) 145/88 (!) 154/86 (!) 149/83  Pulse: (!) 111  92 99  Resp: 17 17 18 20   Temp:  98.1 F (36.7 C) 97.6 F (36.4 C) 97.6 F (36.4 C)  TempSrc:  Oral Oral   SpO2: 100% 100% 100% 100%  Weight:      Height:        Intake/Output Summary (Last 24 hours) at 06/10/2019 1119 Last data  filed at 06/10/2019 0715 Gross per 24 hour  Intake 1915 ml  Output 3050 ml  Net -1135 ml   Filed Weights   06/09/19 1412  Weight: 68 kg    Examination:  General exam: Appears calm and comfortable but weak and cachectic Respiratory system: Clear to auscultation. Respiratory effort normal. Cardiovascular system: S1 & S2 heard, RRR. No JVD, murmurs, rubs, gallops or clicks. No pedal edema. Gastrointestinal system: Abdomen is nondistended, soft and nontender. No organomegaly or masses felt. Normal bowel sounds heard. Central nervous system: Alert and oriented x1. No focal neurological deficits. Extremities: Symmetric 5 x 5 power. Skin: No rashes, lesions or ulcers Psychiatry: Confused   Data Reviewed: I have personally reviewed following labs and imaging studies  CBC: Recent Labs  Lab 06/09/19 1425 06/09/19 2137 06/10/19 0042 06/10/19 0525  WBC 8.7  --   --   --   NEUTROABS 7.7  --   --   --   HGB 6.4* 8.6* 8.2* 7.8*  HCT 19.5* 25.2* 24.2* 22.5*  MCV 88.6  --   --   --   PLT 343  --   --   --  Basic Metabolic Panel: Recent Labs  Lab 06/09/19 1425 06/09/19 2137 06/10/19 0525  NA 127* 127* 135  K 6.2* 6.4* 4.0  CL 97* 98 106  CO2 11* 11* 14*  GLUCOSE 129* 98 81  BUN 140* 143* 103*  CREATININE 10.79* 10.90* 6.36*  CALCIUM 8.4* 8.6* 8.6*   GFR: Estimated Creatinine Clearance: 9.4 mL/min (A) (by C-G formula based on SCr of 6.36 mg/dL (H)). Liver Function Tests: Recent Labs  Lab 06/09/19 1425  AST 23  ALT 25  ALKPHOS 66  BILITOT 1.0  PROT 5.6*  ALBUMIN 2.4*   No results for input(s): LIPASE, AMYLASE in the last 168 hours. Recent Labs  Lab 06/09/19 2137  AMMONIA 53*   Coagulation Profile: Recent Labs  Lab 06/09/19 1425  INR 2.6*   Cardiac Enzymes: Recent Labs  Lab 06/09/19 1425  CKTOTAL 81   BNP (last 3 results) No results for input(s): PROBNP in the last 8760 hours. HbA1C: No results for input(s): HGBA1C in the last 72  hours. CBG: Recent Labs  Lab 06/09/19 2229  GLUCAP 108*   Lipid Profile: No results for input(s): CHOL, HDL, LDLCALC, TRIG, CHOLHDL, LDLDIRECT in the last 72 hours. Thyroid Function Tests: Recent Labs    06/09/19 2137  TSH 1.102   Anemia Panel: Recent Labs    06/09/19 2137 06/10/19 0042  VITAMINB12 238  --   FERRITIN  --  86  TIBC  --  301  IRON  --  12*   Sepsis Labs: Recent Labs  Lab 06/09/19 1425 06/09/19 2137  LATICACIDVEN 2.2* 1.3    Recent Results (from the past 240 hour(s))  Culture, blood (Routine x 2)     Status: None (Preliminary result)   Collection Time: 06/09/19  2:25 PM   Specimen: BLOOD  Result Value Ref Range Status   Specimen Description BLOOD RIGHT ANTECUBITAL  Final   Special Requests   Final    BOTTLES DRAWN AEROBIC AND ANAEROBIC Blood Culture adequate volume   Culture   Final    NO GROWTH < 24 HOURS Performed at [facility], [place] [address] [place], [place] [address]    Report Status PENDING  Incomplete  Culture, blood (Routine x 2)     Status: None (Preliminary result)   Collection Time: 06/09/19  2:44 PM   Specimen: BLOOD  Result Value Ref Range Status   Specimen Description BLOOD LEFT ANTECUBITAL  Final   Special Requests   Final    BOTTLES DRAWN AEROBIC AND ANAEROBIC Blood Culture adequate volume   Culture   Final    NO GROWTH < 24 HOURS Performed at [facility], [place] [address], [place], [place] [address]    Report Status PENDING  Incomplete  SARS CORONAVIRUS 2 (TAT 6-24 HRS) Nasopharyngeal Nasopharyngeal Swab     Status: None   Collection Time: 06/09/19  7:39 PM   Specimen: Nasopharyngeal Swab  Result Value Ref Range Status   SARS Coronavirus 2 NEGATIVE NEGATIVE Final    Comment: (NOTE) SARS-CoV-2 target nucleic acids are NOT DETECTED. The SARS-CoV-2 RNA is generally detectable in upper and lower respiratory specimens during the acute phase of infection. Negative results do not preclude SARS-CoV-2 infection, do  not rule out co-infections with other pathogens, and should not be used as the sole basis for treatment or other patient management decisions. Negative results must be combined with clinical observations, patient history, and epidemiological information. The expected result is Negative. Fact Sheet for Patients: SugarRoll.be Fact Sheet  for Healthcare Providers: https://www.woods-mathews.com/ This test is not yet approved or cleared by the [place] and  has been authorized for detection and/or diagnosis of SARS-CoV-2 by FDA under an Emergency Use Authorization (EUA). This EUA will remain  in effect (meaning this test can be used) for the duration of the COVID-19 declaration under Section 56 4(b)(1) of the Act, 21 U.S.C. section 360bbb-3(b)(1), unless the authorization is terminated or revoked sooner. Performed at [facility], [place] [address], [place], [place] [address]       Radiology Studies: CT ABDOMEN PELVIS WO CONTRAST  Result Date: 06/09/2019 CLINICAL DATA:  Gross hematuria EXAM: CT ABDOMEN AND PELVIS WITHOUT CONTRAST TECHNIQUE: Multidetector CT imaging of the abdomen and pelvis was performed following the standard protocol without IV contrast. COMPARISON:  None. FINDINGS: Lower chest: No acute pleural or parenchymal lung disease. Hepatobiliary: Gallbladder is decompressed which limits its evaluation. Unenhanced imaging of the liver is unremarkable. Pancreas: Unremarkable. No pancreatic ductal dilatation or surrounding inflammatory changes. Spleen: Normal in size without focal abnormality. Adrenals/Urinary Tract: There is bilateral obstructive uropathy, left greater than right. I do not see an obstructing calculus. Distension of the ureters and renal collecting systems could be due to chronic bladder outlet obstruction given the degree of bladder distention on this exam. Diffuse mural calcifications are seen throughout the bladder.  No definite bladder wall mass or filling defect. Foley catheter is seen within the bladder lumen. The adrenals are unremarkable. Stomach/Bowel: No bowel obstruction or ileus. Normal appendix right lower quadrant. No wall thickening or inflammatory changes. Vascular/Lymphatic: There is aneurysmal dilatation of the infrarenal abdominal aorta, measuring up to 4 cm in anterior-posterior dimension. Diffuse calcified atheromatous plaque throughout the aorta and its branches. No pathologic adenopathy within the abdomen or pelvis. Reproductive: Prostate is enlarged, measuring 5.4 x 5.4 cm in transverse direction. Other: There is trace free fluid in the pelvis, nonspecific. Perirectal fat stranding is noted, also nonspecific. Musculoskeletal: No acute or destructive bony lesions. Reconstructed images demonstrate no additional findings. IMPRESSION: 1. Bilateral obstructive uropathy, left greater than right, which may be due to bladder distension and chronic bladder outlet obstruction. No evidence of urinary tract calculi. 2. Distended bladder despite Foley catheter. Please correlate with catheter function. Diffuse mural calcifications of the bladder without filling defect or mass. 3. Enlarged prostate. 4. 4 cm infrarenal abdominal aortic aneurysm. Recommend followup by ultrasound in 1 year. This recommendation follows ACR consensus guidelines: White Paper of the ACR Incidental Findings Committee II on Vascular Findings. J Am Coll Radiol 2013; 10:789-794. Aortic aneurysm NOS (ICD10-I71.9) 5. Trace pelvic free fluid. 6. Perirectal fat stranding of uncertain significance. Electronically Signed   By: Randa Ngo M.D.   On: 06/09/2019 21:29   CT Head Wo Contrast  Result Date: 06/09/2019 CLINICAL DATA:  Urinary tract infection, anorexia EXAM: CT HEAD WITHOUT CONTRAST TECHNIQUE: Contiguous axial images were obtained from the base of the skull through the vertex without intravenous contrast. COMPARISON:  12/30/2017 FINDINGS:  Brain: Hypodensities throughout the periventricular white matter compatible with stable chronic small vessel ischemic changes. No signs of acute infarct or hemorrhage. Stable prominence of the lateral ventricles. Remaining midline structures are unremarkable. No acute extra-axial fluid collections. No mass effect. Vascular: No hyperdense vessel or unexpected calcification. Skull: Normal. Negative for fracture or focal lesion. Sinuses/Orbits: No acute finding. Other: None IMPRESSION: 1. Stable chronic small vessel ischemic changes in the white matter. No acute intracranial process. 2. Stable prominence of the lateral ventricles out of proportion  to the degree of cerebral atrophy, compatible with normal pressure hydrocephalus. Electronically Signed   By: Randa Ngo M.D.   On: 06/09/2019 19:29   DG Chest Portable 1 View  Result Date: 06/09/2019 CLINICAL DATA:  79 year old male with sepsis. EXAM: PORTABLE CHEST 1 VIEW COMPARISON:  Chest radiograph dated 01/13/2015 and CT dated 01/07/2012. FINDINGS: Background of emphysema. No focal consolidation, pleural effusion, pneumothorax. The cardiac silhouette is within normal limits. Atherosclerotic calcification of the aorta. No acute osseous pathology. Upper abdominal surgical suture. IMPRESSION: 1. No acute cardiopulmonary process. 2. Emphysema. Electronically Signed   By: Anner Crete M.D.   On: 06/09/2019 17:47    Scheduled Meds: . Chlorhexidine Gluconate Cloth  6 each Topical Daily  . pantoprazole  40 mg Intravenous Q12H   Continuous Infusions: .  sodium bicarbonate (isotonic) infusion in sterile water 125 mL/hr at 06/10/19 1108     LOS: 1 day   Time spent: 39 minutes   Darliss Cheney, MD [facility]  06/10/2019, 11:19 AM   To contact the attending provider between 7A-7P or the covering provider during after hours 7P-7A, please log into the web site [email or web address].

## 2019-06-10 NOTE — Evaluation (Addendum)
Physical Therapy Evaluation Patient Details Name: Henry Carter MRN: [id number] DOB: 1940/10/01 Today's Date: 06/10/2019   History of Present Illness  Pt is a 79 y.o. male admitted 06/09/19 with AMS and decreased p.o. intake, wife reports pt also fell out of bed hitting head, no LOC. Head CT negative for acute intracranial abnormality. Worked up for [employer], acute renal failure, anemia, severe metabolic acidosis. Found to have bladder distention. PMH includes COPD, HTN, PVD, DVT on anticoagulation, BPH with chronic indwelling catheter.    Clinical Impression  Pt presents with an overall decrease in functional mobility secondary to above. PTA, pt reports mod indep with SPC, lives with wife; unsure pt reliable historian, seems confused throughout session, slow to respond and follow commands, A&Ox2. Today, pt required mod-maxA for limited mobility, able to maintain static standing with RW and minA, requiring assist for ADLs, unaware of bowel incontinence. SpO2 >/90% on RA. Pt would benefit from intensive CIR-level therapies to maximize functional mobility and independence. If CIR not an option, would benefit from SNF.    Follow Up Recommendations CIR;Supervision/Assistance - 24 hour    Equipment Recommendations  (TBD)    Recommendations for Other Services       Precautions / Restrictions Precautions Precautions: Fall;Other (comment) Precaution Comments: Chronic indwelling folley catheter; bowel incontinence Restrictions Weight Bearing Restrictions: No      Mobility  Bed Mobility Overal bed mobility: Needs Assistance Bed Mobility: Supine to Sit;Sit to Supine     Supine to sit: Mod assist;Max assist Sit to supine: Min assist   General bed mobility comments: Pt took significant time and effort to come to sitting EOB - would initiate movement and agree to sit when asked, but then would lay head back down, multiple times; eventually requiring mod-maxA to sit EOB; minA for LE assist  to return to supine  Transfers Overall transfer level: Needs assistance Equipment used: Rolling walker (2 wheeled) Transfers: Sit to/from Stand Sit to Stand: Mod assist         General transfer comment: ModA to assist trunk elevation and maintain balance; pt unaware of bowel incontinence; poor eccentric control falling back onto bed with modA to control descent  Ambulation/Gait             General Gait Details: Deferred  Stairs            Wheelchair Mobility    Modified Rankin (Stroke Patients Only)       Balance Overall balance assessment: Needs assistance   Sitting balance-Leahy Scale: Fair Sitting balance - Comments: Assist to achieve midline, unable to accept challenge to balance     Standing balance-Leahy Scale: Poor Standing balance comment: Reliant on UE support; minA to maintain balance when pt using single UE for posterior pericare, assist to finish this due to poor awareness of cleanliness state                             Pertinent Vitals/Pain Pain Assessment: No/denies pain    Home Living Family/patient expects to be discharged to:: Private residence Living Arrangements: Spouse/significant other Available Help at Discharge: Family Type of Home: House       Home Layout: Two level;Able to live on main level with bedroom/bathroom Home Equipment: Gilford Rile - 2 wheels;Cane - single point Additional Comments: Wife present at end of session, but was not able to verify home set-up info with her    Prior Function Level of Independence: Independent with assistive device(s)  Comments: Pt reports mod indep with SPC; multiple falls     Hand Dominance        Extremity/Trunk Assessment   Upper Extremity Assessment Upper Extremity Assessment: Generalized weakness    Lower Extremity Assessment Lower Extremity Assessment: Generalized weakness    Cervical / Trunk Assessment Cervical / Trunk Assessment: Kyphotic   Communication   Communication: No difficulties  Cognition Arousal/Alertness: Awake/alert Behavior During Therapy: Flat affect Overall Cognitive Status: Impaired/Different from baseline Area of Impairment: Orientation;Attention;Memory;Following commands;Safety/judgement;Awareness;Problem solving                 Orientation Level: Disoriented to;Time;Situation Current Attention Level: Sustained Memory: Decreased short-term memory Following Commands: Follows one step commands with increased time;Follows one step commands inconsistently Safety/Judgement: Decreased awareness of safety;Decreased awareness of deficits Awareness: Intellectual Problem Solving: Slow processing;Decreased initiation;Difficulty sequencing;Requires verbal cues General Comments: Wife present at end of session, sounds like pt typically A&Ox4 (wife asking, "Who did we vote for?") as pt told MD current president is Bush. Pt alert to self and location, does not state year/date and says "I don't know" to why he is in hospital. Answering most questions but seems confused, slow to follow commands or doesn't follow commands at all. Unaware of bowel incontinence      General Comments General comments (skin integrity, edema, etc.): Wife present at end of session - seemed overwhelmed by mention of post-acute rehab at SNF, not very forthcoming with conversation regarding pt's need for assist and current status    Exercises     Assessment/Plan    PT Assessment Patient needs continued PT services  PT Problem List Decreased strength;Decreased activity tolerance;Decreased balance;Decreased mobility;Decreased cognition;Decreased knowledge of use of DME;Decreased safety awareness       PT Treatment Interventions DME instruction;Gait training;Stair training;Functional mobility training;Therapeutic activities;Therapeutic exercise;Balance training;Cognitive remediation;Patient/family education    PT Goals (Current goals can be  found in the Care Plan section)  Acute Rehab PT Goals Patient Stated Goal: None stated PT Goal Formulation: With patient/family Time For Goal Achievement: 06/24/19    Frequency Min 3X/week   Barriers to discharge        Co-evaluation               AM-PAC PT "6 Clicks" Mobility  Outcome Measure Help needed turning from your back to your side while in a flat bed without using bedrails?: A Lot Help needed moving from lying on your back to sitting on the side of a flat bed without using bedrails?: A Lot Help needed moving to and from a bed to a chair (including a wheelchair)?: A Lot Help needed standing up from a chair using your arms (e.g., wheelchair or bedside chair)?: A Lot Help needed to walk in hospital room?: A Lot Help needed climbing 3-5 steps with a railing? : A Lot 6 Click Score: 12    End of Session   Activity Tolerance: Patient limited by fatigue Patient left: in bed;with call bell/phone within reach;with bed alarm set;with family/visitor present Nurse Communication: Mobility status PT Visit Diagnosis: Other abnormalities of gait and mobility (R26.89);Muscle weakness (generalized) (M62.81)    Time: 1030-1059 PT Time Calculation (min) (ACUTE ONLY): 29 min   Charges:   PT Evaluation $PT Eval Moderate Complexity: 1 Mod PT Treatments $Therapeutic Activity: 8-22 mins   Henry Carter, PT, DPT Acute Rehabilitation Services  Pager [phone] Office [phone]  Derry Lory 06/10/2019, 12:50 PM

## 2019-06-10 NOTE — Progress Notes (Signed)
Rehab Admissions Coordinator Note:  Per PT recommendation, this patient was screened by Raechel Ache for appropriateness for an Inpatient Acute Rehab Consult.  At this time, we are recommending an Inpatient Rehab consult. AC will place consult order per protocol.   Raechel Ache 06/10/2019, 1:34 PM  I can be reached at [phone].

## 2019-06-11 ENCOUNTER — Encounter (HOSPITAL_COMMUNITY)
Admission: EM | Disposition: A | Discharge status: Home Health | Payer: Self-pay | Source: Home / Self Care | Attending: Family Medicine

## 2019-06-11 DIAGNOSIS — I82402 Acute embolism and thrombosis of unspecified deep veins of left lower extremity: Secondary | ICD-10-CM

## 2019-06-11 HISTORY — PX: IVC FILTER INSERTION: CATH118245

## 2019-06-11 HISTORY — PX: IVC VENOGRAPHY: CATH118301

## 2019-06-11 LAB — CBC
HCT: 24.7 % — ABNORMAL LOW (ref 39.0–52.0)
HCT: 25.5 % — ABNORMAL LOW (ref 39.0–52.0)
Hemoglobin: 8.3 g/dL — ABNORMAL LOW (ref 13.0–17.0)
Hemoglobin: 8.4 g/dL — ABNORMAL LOW (ref 13.0–17.0)
MCH: 28.4 pg (ref 26.0–34.0)
MCH: 29.1 pg (ref 26.0–34.0)
MCHC: 33.6 g/dL (ref 30.0–36.0)
MCHC: 32.9 g/dL (ref 30.0–36.0)
MCV: 84.6 fL (ref 80.0–100.0)
MCV: 88.2 fL (ref 80.0–100.0)
Platelets: 344 10*3/uL (ref 150–400)
Platelets: 362 10*3/uL (ref 150–400)
RBC: 2.92 MIL/uL — ABNORMAL LOW (ref 4.22–5.81)
RBC: 2.89 MIL/uL — ABNORMAL LOW (ref 4.22–5.81)
RDW: 16.2 % — ABNORMAL HIGH (ref 11.5–15.5)
RDW: 16.5 % — ABNORMAL HIGH (ref 11.5–15.5)
WBC: 8 10*3/uL (ref 4.0–10.5)
WBC: 9.4 10*3/uL (ref 4.0–10.5)
nRBC: 0.2 % (ref 0.0–0.2)
nRBC: 0 % (ref 0.0–0.2)

## 2019-06-11 LAB — BASIC METABOLIC PANEL
Anion gap: 12 (ref 5–15)
BUN: 32 mg/dL — ABNORMAL HIGH (ref 8–23)
CO2: 26 mmol/L (ref 22–32)
Calcium: 8 mg/dL — ABNORMAL LOW (ref 8.9–10.3)
Chloride: 104 mmol/L (ref 98–111)
Creatinine, Ser: 1.59 mg/dL — ABNORMAL HIGH (ref 0.61–1.24)
GFR calc Af Amer: 48 mL/min — ABNORMAL LOW (ref >60)
GFR calc non Af Amer: 41 mL/min — ABNORMAL LOW (ref >60)
Glucose, Bld: 92 mg/dL (ref 70–99)
Potassium: 3.3 mmol/L — ABNORMAL LOW (ref 3.5–5.1)
Sodium: 142 mmol/L (ref 135–145)

## 2019-06-11 LAB — URINE CULTURE: Culture: >=100000 — AB

## 2019-06-11 LAB — CULTURE, BLOOD (ROUTINE X 2): Special Requests: ADEQUATE

## 2019-06-11 SURGERY — IVC FILTER INSERTION
Anesthesia: LOCAL

## 2019-06-11 MED ORDER — INFLUENZA VAC A&B SA ADJ QUAD 0.5 ML IM PRSY
0.5000 mL | PREFILLED_SYRINGE | INTRAMUSCULAR | Status: DC
  Filled 2019-06-11: qty 0.5

## 2019-06-11 MED ORDER — IODIXANOL 320 MG/ML IV SOLN
INTRAVENOUS | Status: DC | PRN
  Administered 2019-06-11: 30 mL via INTRAVENOUS

## 2019-06-11 MED ORDER — SODIUM CHLORIDE 0.9 % IV SOLN: INTRAVENOUS | Status: DC

## 2019-06-11 MED ORDER — METOPROLOL TARTRATE 5 MG/5ML IV SOLN
5.0000 mg | INTRAVENOUS | Status: AC | PRN
  Administered 2019-06-11: 5 mg via INTRAVENOUS
  Filled 2019-06-11: qty 5

## 2019-06-11 MED ORDER — LIDOCAINE HCL (PF) 1 % IJ SOLN
INTRAMUSCULAR | Status: AC
  Filled 2019-06-11: qty 30

## 2019-06-11 MED ORDER — HEPARIN (PORCINE) IN NACL 1000-0.9 UT/500ML-% IV SOLN
INTRAVENOUS | Status: AC
  Filled 2019-06-11: qty 500

## 2019-06-11 MED ORDER — LIDOCAINE HCL (PF) 1 % IJ SOLN
INTRAMUSCULAR | Status: DC | PRN
  Administered 2019-06-11: 15 mL

## 2019-06-11 MED ORDER — HEPARIN (PORCINE) IN NACL 1000-0.9 UT/500ML-% IV SOLN
INTRAVENOUS | Status: DC | PRN
  Administered 2019-06-11: 500 mL

## 2019-06-11 SURGICAL SUPPLY — 12 items
BAG SNAP BAND KOVER 36X36 (MISCELLANEOUS) ×1 IMPLANT
COVER DOME SNAP 22 D (MISCELLANEOUS) ×1 IMPLANT
FILTER VC CELECT-FEMORAL (Filter) ×1 IMPLANT
KIT PV (KITS) ×2 IMPLANT
PROTECTION STATION PRESSURIZED (MISCELLANEOUS) ×2
SHEATH PROBE COVER 6X72 (BAG) ×1 IMPLANT
STATION PROTECTION PRESSURIZED (MISCELLANEOUS) IMPLANT
SYR MEDRAD MARK 7 150ML (SYRINGE) ×2 IMPLANT
TRANSDUCER W/STOPCOCK (MISCELLANEOUS) ×2 IMPLANT
TRAY PV CATH (CUSTOM PROCEDURE TRAY) ×2 IMPLANT
TUBING HIGH PRESSURE 120CM (CONNECTOR) ×1 IMPLANT
WIRE BENTSON .035X145CM (WIRE) ×1 IMPLANT

## 2019-06-11 NOTE — Progress Notes (Signed)
Inpatient Rehab Admissions:  Inpatient Rehab Consult received.  I met with patient at the bedside for rehabilitation assessment and to discuss goals and expectations of an inpatient rehab admission.  He is confused, repeatedly asking for water despite education on NPO status.  Will f/u with pt's wife regarding dispo support prior to opening insurance for authorization.   Signed: Shann Medal, PT, DPT Admissions Coordinator [phone] 06/11/19  1:06 PM

## 2019-06-11 NOTE — Progress Notes (Signed)
PHARMACY - PHYSICIAN COMMUNICATION CRITICAL VALUE ALERT - BLOOD CULTURE IDENTIFICATION (BCID)  Henry Carter is an 79 y.o. male who presented to [name] on 06/09/2019  Assessment:  1 bottle with gram positive rods, no BCID  Name of physician (or Provider) Contacted: Dr. Doristine Bosworth  Current antibiotics: None  Changes to prescribed antibiotics recommended:  None  No results found for this or any previous visit.  Narda Bonds 06/11/2019  7:26 AM

## 2019-06-11 NOTE — Progress Notes (Signed)
PROGRESS NOTE    Henry Carter  [id number] DOB: 09-02-1940 DOA: 06/09/2019 PCP: [name], MD   Brief Narrative:  HPI: Henry Carter is a 79 y.o. male with medical history significant of iron deficiency anemia, BPH, COPD, hypertension, GERD, hyperlipidemia, PVD, history of DVT on anticoagulation, has a chronic indwelling foley catheter for BPH presenting to the ED for evaluation of altered mental status and decreased p.o. intake.  Patient's wife reported to ED provider that he has not been eating/drinking or taking his medications for the last 9 days.  He fell 2 days ago while getting out of bed and hit his head on the carpet.  No loss of consciousness reported.  He has been confused.  He has had bleeding from his Foley for the last 7 to 8 days.  Patient is confused and not sure why he is here.  His only complaint is suprapubic pain.  Denies chest pain, shortness of breath, nausea, vomiting, or diarrhea.  No additional history could be obtained from him.  ED Course: Afebrile.  Slightly tachycardic.  Labs showing no leukocytosis.  Lactic acid mildly elevated at 2.2.  Hemoglobin 6.4, was in the 10-11 range on recent labs.  FOBT positive.  No grossly bloody or melanotic stool noted on rectal exam.  Sodium 127, potassium 6.2, bicarb 11, anion gap 19, glucose 129.  BUN 140, creatinine 10.7.  Baseline creatinine 0.9.  LFTs normal.  Blood culture x2 pending.  High-sensitivity troponin negative.  CK normal.  SARS-CoV-2 rapid antigen test negative, PCR test pending.  UA uninterpretable due to gross hematuria. Microscopic examination showing greater than 50 RBCs, 0-5 WBCs, and many bacteria.  Urine culture pending. Chest x-ray showing emphysema and no active cardiopulmonary process. Head CT negative for acute intracranial abnormality.  Showing stable appearance of the lateral ventricles out of proportion to the degree of cerebral atrophy, compatible with normal pressure  hydrocephalus.  Patient received IV Protonix 40 mg and 1 L normal saline bolus.  1 unit PRBCs ordered.  Assessment & Plan:   Principal Problem:   Hematuria Active Problems:   Acute renal failure (ARF) (HCC)   Acute blood loss anemia   Hyperkalemia   Metabolic acidosis  Gross hematuria: Patient has a chronic indwelling Foley catheter in the setting of BPH.  Urology consulted.  Foley catheter was replaced.  CT abdomen pelvis showing bilateral obstructive uropathy, left greater than right, which may be due to bladder distention and chronic bladder outlet obstruction.  No evidence of urinary tract calculi.  Distended bladder and enlarged prostate.  Urine has improved and is close to normal color now.  Management deferred to urology.  Acute kidney injury/obstructive uropathy: Likely secondary to dehydration as well as obstructive uropathy due to blood clots.  Excellent urine output and improvement in creatinine.  Nephrology signed off.  Acute blood loss anemia: Secondary to gross hematuria.  Hemoglobin of 6.4.  Received 1 unit of PRBC transfusion on 06/09/2019.  Hemoglobin 7.8 improved and then dropped to 6.9 and he received another transfusion on 06/10/2019.  Hemoglobin currently 8.3.  Hyperkalemia: Received insulin/D50, Kayexalate and Lokelma.  Resolved.  Hypokalemia: 3.3.  Replace orally.  Recheck in the morning.  Severe metabolic acidosis: Resolved.  Mild acute hyponatremia: Resolved.  Acute metabolic encephalopathy: Likely secondary to acute kidney injury.  Apparently, he is alert and slightly more oriented x1-2 but is still not back to baseline.  Positive FOBT: Suspicion that this may be contaminated from cross hematuria.  No history of hematochezia  or black stool.  He received 1 dose of IV PPI in the ED.  Mild lactic acidosis: Resolved.  Physical deconditioning/recent fall: PT OT seeing patient.  They recommend CIR.  CIR consulted.  History of normal pressure hydrocephalus:  Head CT showing stable appearance of the lateral ventricles out of proportion to the degree of cerebral atrophy, compatible with normal pressure hydrocephalus.  No acute issues.  No neurology consultation indicated.  Recently diagnosed DVT 05/11/2019:  Xarelto on hold.  With his gross hematuria causing acute blood loss anemia, I doubt that he will be candidate for anticoagulation anytime soon.  Called and discussed with vascular surgery, Dr. Donzetta Matters and he agrees that he might be a good candidate for IVC filter.  Patient is a scheduled to have IVC filter today.  Essential hypertension: Blood pressure stable.  Not on any antihypertensives at home.  AAA: CT showing a 4 cm infrarenal abdominal aortic aneurysm.  Radiologist recommending follow-up ultrasound in 1 year per ACR consensus guidelines.  Blood culture positive: Only one of the 4 bottles is growing gram-positive rods.  Likely contaminant.  Monitor off of antibiotics.  DVT prophylaxis: SCD Code Status: Full code Family Communication:  None present at bedside.  Patient is from: Home Disposition Plan: To be determined Barriers to discharge: Clinical improvement/gross hematuria/acute metabolic encephalopathy/IVC filter placement scheduled today   Estimated body mass index is 20.92 kg/m as calculated from the following:   Height as of this encounter: 5\' 11"  (1.803 m).   Weight as of this encounter: 68 kg.      Nutritional status:               Consultants:   Urology and nephrology and vascular surgery  Procedures:   None  Antimicrobials:   None   Subjective: Patient seen and examined.  He is alert but remains oriented x2, slightly better than yesterday.  Denied any complaint.  Objective: Vitals:   06/11/19 0440 06/11/19 0738 06/11/19 0855 06/11/19 1252  BP: (!) 150/76 (!) 150/77  (!) 152/83  Pulse: 89 85 83 73  Resp: 18  20   Temp: 98.9 F (37.2 C) 98.8 F (37.1 C)  98.2 F (36.8 C)  TempSrc: Oral      SpO2: 98% 99% 98% 100%  Weight:      Height:        Intake/Output Summary (Last 24 hours) at 06/11/2019 1411 Last data filed at 06/11/2019 0500 Gross per 24 hour  Intake 3162.83 ml  Output 2200 ml  Net 962.83 ml   Filed Weights   06/09/19 1412  Weight: 68 kg    Examination:  General exam: Appears calm and comfortable  Respiratory system: Clear to auscultation. Respiratory effort normal. Cardiovascular system: S1 & S2 heard, RRR. No JVD, murmurs, rubs, gallops or clicks. No pedal edema. Gastrointestinal system: Abdomen is nondistended, soft and nontender. No organomegaly or masses felt. Normal bowel sounds heard. Central nervous system: Alert and oriented x2. No focal neurological deficits. Extremities: Symmetric 5 x 5 power. Skin: No rashes, lesions or ulcers.  Psychiatry: Judgement and insight appear poor, mood & affect appropriate.   Data Reviewed: I have personally reviewed following labs and imaging studies  CBC: Recent Labs  Lab 06/09/19 1425 06/09/19 1425 06/09/19 2137 06/10/19 0042 06/10/19 0525 06/10/19 1810 06/11/19 0714  WBC 8.7  --   --   --   --  8.4 8.0  NEUTROABS 7.7  --   --   --   --   --   --  HGB 6.4*   < > 8.6* 8.2* 7.8* 6.9* 8.3*  HCT 19.5*   < > 25.2* 24.2* 22.5* 19.8* 24.7*  MCV 88.6  --   --   --   --  84.3 84.6  PLT 343  --   --   --   --  337 344   < > = values in this interval not displayed.   Basic Metabolic Panel: Recent Labs  Lab 06/09/19 1425 06/09/19 2137 06/10/19 0525 06/10/19 1549 06/11/19 0714  NA 127* 127* 135 139 142  K 6.2* 6.4* 4.0 2.9* 3.3*  CL 97* 98 106 105 104  CO2 11* 11* 14* 22 26  GLUCOSE 129* 98 81 127* 92  BUN 140* 143* 103* 62* 32*  CREATININE 10.79* 10.90* 6.36* 3.21* 1.59*  CALCIUM 8.4* 8.6* 8.6* 8.0* 8.0*   GFR: Estimated Creatinine Clearance: 37.4 mL/min (A) (by C-G formula based on SCr of 1.59 mg/dL (H)). Liver Function Tests: Recent Labs  Lab 06/09/19 1425  AST 23  ALT 25  ALKPHOS 66   BILITOT 1.0  PROT 5.6*  ALBUMIN 2.4*   No results for input(s): LIPASE, AMYLASE in the last 168 hours. Recent Labs  Lab 06/09/19 2137  AMMONIA 53*   Coagulation Profile: Recent Labs  Lab 06/09/19 1425  INR 2.6*   Cardiac Enzymes: Recent Labs  Lab 06/09/19 1425  CKTOTAL 81   BNP (last 3 results) No results for input(s): PROBNP in the last 8760 hours. HbA1C: No results for input(s): HGBA1C in the last 72 hours. CBG: Recent Labs  Lab 06/09/19 2229  GLUCAP 108*   Lipid Profile: No results for input(s): CHOL, HDL, LDLCALC, TRIG, CHOLHDL, LDLDIRECT in the last 72 hours. Thyroid Function Tests: Recent Labs    06/09/19 2137  TSH 1.102   Anemia Panel: Recent Labs    06/09/19 2137 06/10/19 0042  VITAMINB12 238  --   FERRITIN  --  86  TIBC  --  301  IRON  --  12*   Sepsis Labs: Recent Labs  Lab 06/09/19 1425 06/09/19 2137  LATICACIDVEN 2.2* 1.3    Recent Results (from the past 240 hour(s))  Culture, blood (Routine x 2)     Status: None (Preliminary result)   Collection Time: 06/09/19  2:25 PM   Specimen: BLOOD  Result Value Ref Range Status   Specimen Description BLOOD RIGHT ANTECUBITAL  Final   Special Requests   Final    BOTTLES DRAWN AEROBIC AND ANAEROBIC Blood Culture adequate volume   Culture  Setup Time   Final    GRAM POSITIVE RODS AEROBIC BOTTLE ONLY CRITICAL RESULT CALLED TO, READ BACK BY AND VERIFIED WITH: PHARMD J LADFORD [id number] AT 724 AM BY CM    Culture   Final    NO GROWTH 2 DAYS Performed at [facility], [place] [address] [place], [place] [address]    Report Status PENDING  Incomplete  Culture, blood (Routine x 2)     Status: Abnormal   Collection Time: 06/09/19  2:44 PM   Specimen: BLOOD  Result Value Ref Range Status   Specimen Description BLOOD LEFT ANTECUBITAL  Final   Special Requests   Final    BOTTLES DRAWN AEROBIC AND ANAEROBIC Blood Culture adequate volume   Culture  Setup Time   Final    GRAM POSITIVE COCCI IN  CLUSTERS AEROBIC BOTTLE ONLY CRITICAL RESULT CALLED TO, READ BACK BY AND VERIFIED WITH: [place] [id number] AT 1456 BY CM  Culture (A)  Final    STAPHYLOCOCCUS SPECIES (COAGULASE NEGATIVE) THE SIGNIFICANCE OF ISOLATING THIS ORGANISM FROM A SINGLE SET OF BLOOD CULTURES WHEN MULTIPLE SETS ARE DRAWN IS UNCERTAIN. PLEASE NOTIFY THE MICROBIOLOGY DEPARTMENT WITHIN ONE WEEK IF SPECIATION AND SENSITIVITIES ARE REQUIRED. Performed at [facility], [place] [address] [place], [place] [address]    Report Status 06/11/2019 FINAL  Final  SARS CORONAVIRUS 2 (TAT 6-24 HRS) Nasopharyngeal Nasopharyngeal Swab     Status: None   Collection Time: 06/09/19  7:39 PM   Specimen: Nasopharyngeal Swab  Result Value Ref Range Status   SARS Coronavirus 2 NEGATIVE NEGATIVE Final    Comment: (NOTE) SARS-CoV-2 target nucleic acids are NOT DETECTED. The SARS-CoV-2 RNA is generally detectable in upper and lower respiratory specimens during the acute phase of infection. Negative results do not preclude SARS-CoV-2 infection, do not rule out co-infections with other pathogens, and should not be used as the sole basis for treatment or other patient management decisions. Negative results must be combined with clinical observations, patient history, and epidemiological information. The expected result is Negative. Fact Sheet for Patients: SugarRoll.be Fact Sheet for Healthcare Providers: https://www.woods-mathews.com/ This test is not yet approved or cleared by the [place] FDA and  has been authorized for detection and/or diagnosis of SARS-CoV-2 by FDA under an Emergency Use Authorization (EUA). This EUA will remain  in effect (meaning this test can be used) for the duration of the COVID-19 declaration under Section 56 4(b)(1) of the Act, 21 U.S.C. section 360bbb-3(b)(1), unless the authorization is terminated or revoked sooner. Performed at [facility], [place] [address], [place], [place] [address]   Urine culture     Status: Abnormal   Collection Time: 06/09/19  9:59 PM   Specimen: Urine, Random  Result Value Ref Range Status   Specimen Description URINE, RANDOM  Final   Special Requests NONE  Final   Culture (A)  Final    >=100,000 COLONIES/mL DIPHTHEROIDS(CORYNEBACTERIUM SPECIES) Standardized susceptibility testing for this organism is not available. Performed at [facility], [place] [address], [place], [place] [address]    Report Status 06/11/2019 FINAL  Final      Radiology Studies: CT ABDOMEN PELVIS WO CONTRAST  Result Date: 06/09/2019 CLINICAL DATA:  Gross hematuria EXAM: CT ABDOMEN AND PELVIS WITHOUT CONTRAST TECHNIQUE: Multidetector CT imaging of the abdomen and pelvis was performed following the standard protocol without IV contrast. COMPARISON:  None. FINDINGS: Lower chest: No acute pleural or parenchymal lung disease. Hepatobiliary: Gallbladder is decompressed which limits its evaluation. Unenhanced imaging of the liver is unremarkable. Pancreas: Unremarkable. No pancreatic ductal dilatation or surrounding inflammatory changes. Spleen: Normal in size without focal abnormality. Adrenals/Urinary Tract: There is bilateral obstructive uropathy, left greater than right. I do not see an obstructing calculus. Distension of the ureters and renal collecting systems could be due to chronic bladder outlet obstruction given the degree of bladder distention on this exam. Diffuse mural calcifications are seen throughout the bladder. No definite bladder wall mass or filling defect. Foley catheter is seen within the bladder lumen. The adrenals are unremarkable. Stomach/Bowel: No bowel obstruction or ileus. Normal appendix right lower quadrant. No wall thickening or inflammatory changes. Vascular/Lymphatic: There is aneurysmal dilatation of the infrarenal abdominal aorta, measuring up to 4 cm in anterior-posterior dimension. Diffuse calcified  atheromatous plaque throughout the aorta and its branches. No pathologic adenopathy within the abdomen or pelvis. Reproductive: Prostate is enlarged, measuring 5.4 x 5.4 cm in transverse  direction. Other: There is trace free fluid in the pelvis, nonspecific. Perirectal fat stranding is noted, also nonspecific. Musculoskeletal: No acute or destructive bony lesions. Reconstructed images demonstrate no additional findings. IMPRESSION: 1. Bilateral obstructive uropathy, left greater than right, which may be due to bladder distension and chronic bladder outlet obstruction. No evidence of urinary tract calculi. 2. Distended bladder despite Foley catheter. Please correlate with catheter function. Diffuse mural calcifications of the bladder without filling defect or mass. 3. Enlarged prostate. 4. 4 cm infrarenal abdominal aortic aneurysm. Recommend followup by ultrasound in 1 year. This recommendation follows ACR consensus guidelines: White Paper of the ACR Incidental Findings Committee II on Vascular Findings. J Am Coll Radiol 2013; 10:789-794. Aortic aneurysm NOS (ICD10-I71.9) 5. Trace pelvic free fluid. 6. Perirectal fat stranding of uncertain significance. Electronically Signed   By: Randa Ngo M.D.   On: 06/09/2019 21:29   CT Head Wo Contrast  Result Date: 06/09/2019 CLINICAL DATA:  Urinary tract infection, anorexia EXAM: CT HEAD WITHOUT CONTRAST TECHNIQUE: Contiguous axial images were obtained from the base of the skull through the vertex without intravenous contrast. COMPARISON:  12/30/2017 FINDINGS: Brain: Hypodensities throughout the periventricular white matter compatible with stable chronic small vessel ischemic changes. No signs of acute infarct or hemorrhage. Stable prominence of the lateral ventricles. Remaining midline structures are unremarkable. No acute extra-axial fluid collections. No mass effect. Vascular: No hyperdense vessel or unexpected calcification. Skull: Normal. Negative for fracture or  focal lesion. Sinuses/Orbits: No acute finding. Other: None IMPRESSION: 1. Stable chronic small vessel ischemic changes in the white matter. No acute intracranial process. 2. Stable prominence of the lateral ventricles out of proportion to the degree of cerebral atrophy, compatible with normal pressure hydrocephalus. Electronically Signed   By: Randa Ngo M.D.   On: 06/09/2019 19:29   DG Chest Portable 1 View  Result Date: 06/09/2019 CLINICAL DATA:  79 year old male with sepsis. EXAM: PORTABLE CHEST 1 VIEW COMPARISON:  Chest radiograph dated 01/13/2015 and CT dated 01/07/2012. FINDINGS: Background of emphysema. No focal consolidation, pleural effusion, pneumothorax. The cardiac silhouette is within normal limits. Atherosclerotic calcification of the aorta. No acute osseous pathology. Upper abdominal surgical suture. IMPRESSION: 1. No acute cardiopulmonary process. 2. Emphysema. Electronically Signed   By: Anner Crete M.D.   On: 06/09/2019 17:47    Scheduled Meds: . Chlorhexidine Gluconate Cloth  6 each Topical Daily  . pantoprazole  40 mg Intravenous Q12H   Continuous Infusions: .  sodium bicarbonate (isotonic) infusion in sterile water 125 mL/hr at 06/11/19 0630     LOS: 2 days   Time spent: 28 minutes   Darliss Cheney, MD [facility]  06/11/2019, 2:11 PM   To contact the attending provider between 7A-7P or the covering provider during after hours 7P-7A, please log into the web site [email or web address].

## 2019-06-11 NOTE — Evaluation (Signed)
Occupational Therapy Evaluation Patient Details Name: Henry Carter MRN: [id number] DOB: [birth year] Today's Date: 06/11/2019    History of Present Illness Pt is a 79 y.o. male admitted 06/09/19 with AMS and decreased p.o. intake, wife reports pt also fell out of bed hitting head, no LOC. Head CT negative for acute intracranial abnormality. Worked up for [employer], acute renal failure, anemia, severe metabolic acidosis. Found to have bladder distention. PMH includes COPD, HTN, PVD, DVT on anticoagulation, BPH with chronic indwelling catheter.   Clinical Impression   Pt PTA: pt living with spouse and reports independence with ADL, mobility. Pt currently with cognitive deficits in regards to date, PLOF and short term memory. Pt asking for water throughout session and not able to recall why he was NPO after education. Pt currently limited by decreased ability to mobilize safely and take care of self. Pt SpO2 levels remained >90% on RA. Pt modA overall for ADL and minA for mobility. Pt would benefit from continued OT skilled services for ADL, mobility and safety in CIR setting. OT following acutely. If pt refuses CIR or SNF, pt would need HHOT. Pt stating "I just want to go home."     Follow Up Recommendations  CIR;Supervision/Assistance - 24 hour    Equipment Recommendations  Other (comment)(to be determined)    Recommendations for Other Services       Precautions / Restrictions Precautions Precautions: Fall;Other (comment) Precaution Comments: Chronic indwelling folley catheter; bowel incontinence Restrictions Weight Bearing Restrictions: No      Mobility Bed Mobility Overal bed mobility: Needs Assistance Bed Mobility: Supine to Sit;Sit to Supine     Supine to sit: Min assist Sit to supine: Min guard   General bed mobility comments: Pt with minA for scooting to EOB and assist for push off from elbow  Transfers Overall transfer level: Needs assistance Equipment used: Rolling  walker (2 wheeled) Transfers: Sit to/from Stand Sit to Stand: Min assist         General transfer comment: minA for power up    Balance Overall balance assessment: Needs assistance Sitting-balance support: Bilateral upper extremity supported;Feet supported Sitting balance-Leahy Scale: Fair     Standing balance support: Bilateral upper extremity supported;Single extremity supported;During functional activity Standing balance-Leahy Scale: Fair Standing balance comment: reliant on RW                           ADL either performed or assessed with clinical judgement   ADL Overall ADL's : Needs assistance/impaired Eating/Feeding: NPO Eating/Feeding Details (indicate cue type and reason): until test is done. Pt repeatedly asking for water, eventhough he was educated on the fact that he has a test scheduled and then pt can have a diet per RN.  Grooming: Min guard;Minimal assistance;Sitting;Standing;Cueing for safety Grooming Details (indicate cue type and reason): stood at sink x1 min to wash face and hands Upper Body Bathing: Set up;Sitting   Lower Body Bathing: Moderate assistance;Cueing for safety;Cueing for sequencing;Sitting/lateral leans;Sit to/from stand   Upper Body Dressing : Set up;Sitting   Lower Body Dressing: Moderate assistance;Cueing for safety;Sitting/lateral leans;Sit to/from stand   Toilet Transfer: Min guard;Ambulation;Comfort height toilet;Grab bars;RW   Toileting- Clothing Manipulation and Hygiene: Moderate assistance;Cueing for safety;Sitting/lateral lean;Sit to/from stand       Functional mobility during ADLs: Minimal assistance;Cueing for safety;Rolling walker General ADL Comments: Pt limited by decreased safety awareness, decreased ability to care for self safely and decreased cognition.     Vision Baseline  Vision/History: No visual deficits Patient Visual Report: No change from baseline Vision Assessment?: No apparent visual deficits      Perception     Praxis      Pertinent Vitals/Pain Pain Assessment: No/denies pain     Hand Dominance Right   Extremity/Trunk Assessment Upper Extremity Assessment Upper Extremity Assessment: Generalized weakness   Lower Extremity Assessment Lower Extremity Assessment: Generalized weakness   Cervical / Trunk Assessment Cervical / Trunk Assessment: Kyphotic   Communication Communication Communication: No difficulties   Cognition Arousal/Alertness: Awake/alert Behavior During Therapy: Flat affect Overall Cognitive Status: Impaired/Different from baseline Area of Impairment: Orientation;Attention;Memory;Following commands;Safety/judgement;Awareness;Problem solving                 Orientation Level: Disoriented to;Time;Situation(Pt able to state March with cues, but no other date details.) Current Attention Level: Sustained Memory: Decreased short-term memory(Pt wanting water and asked multiple times , but pt NPO.) Following Commands: Follows one step commands with increased time;Follows one step commands inconsistently Safety/Judgement: Decreased awareness of safety;Decreased awareness of deficits Awareness: Intellectual Problem Solving: Slow processing;Decreased initiation;Difficulty sequencing;Requires verbal cues     General Comments  VSS. Pt ambulating 33' with RW and minguardA.    Exercises     Shoulder Instructions      Home Living Family/patient expects to be discharged to:: Private residence Living Arrangements: Spouse/significant other Available Help at Discharge: Family Type of Home: House       Home Layout: Two level;Able to live on main level with bedroom/bathroom               Home Equipment: Gilford Rile - 2 wheels;Cane - single point   Additional Comments: Pt unable to provide additional details this session on home set-up and spouse is not in room      Prior Functioning/Environment Level of Independence: Independent with assistive  device(s)        Comments: Pt reports mod indep with SPC; multiple falls        OT Problem List: Decreased strength;Decreased activity tolerance;Impaired balance (sitting and/or standing);Decreased safety awareness;Pain      OT Treatment/Interventions: Self-care/ADL training;Therapeutic exercise;Energy conservation;DME and/or AE instruction;Therapeutic activities;Patient/family education;Balance training    OT Goals(Current goals can be found in the care plan section) Acute Rehab OT Goals Patient Stated Goal: None stated OT Goal Formulation: With patient Time For Goal Achievement: 06/25/19 Potential to Achieve Goals: Good ADL Goals Pt Will Perform Grooming: with supervision;standing Pt Will Perform Lower Body Dressing: with supervision;sitting/lateral leans;sit to/from stand Pt Will Transfer to Toilet: with supervision;ambulating;regular height toilet Pt/caregiver will Perform Home Exercise Program: Increased strength;Both right and left upper extremity;With Supervision Additional ADL Goal #1: Pt will increase standing tolerance x5 mins in fair balance in prep for ADL  OT Frequency: Min 2X/week   Barriers to D/C:            Co-evaluation              AM-PAC OT "6 Clicks" Daily Activity     Outcome Measure Help from another person eating meals?: A Little Help from another person taking care of personal grooming?: A Little Help from another person toileting, which includes using toliet, bedpan, or urinal?: A Lot Help from another person bathing (including washing, rinsing, drying)?: A Lot Help from another person to put on and taking off regular upper body clothing?: A Little Help from another person to put on and taking off regular lower body clothing?: A Lot 6 Click Score: 15   End of Session  Equipment Utilized During Treatment: Administrator, arts Communication: Mobility status  Activity Tolerance: Patient tolerated treatment well Patient left: in  bed;with call bell/phone within reach;with bed alarm set  OT Visit Diagnosis: Unsteadiness on feet (R26.81);Muscle weakness (generalized) (M62.81)                Time: 1020-1100 OT Time Calculation (min): 40 min Charges:  OT General Charges $OT Visit: 1 Visit OT Evaluation $OT Eval Moderate Complexity: 1 Mod OT Treatments $Self Care/Home Management : 8-22 mins $Therapeutic Activity: 8-22 mins  Jefferey Pica, OTR/L Acute Rehabilitation Services Pager: [phone] Office: [place] 06/11/2019, 12:15 PM

## 2019-06-11 NOTE — Progress Notes (Signed)
[facility] notified BP 176/86 Hr 75 asymptomatic no BP meds ordered. Arthor Captain LPN

## 2019-06-11 NOTE — Progress Notes (Signed)
  Progress Note    06/11/2019 11:25 AM * No surgery date entered *  Subjective: Denies overnight issues  Vitals:   06/11/19 0738 06/11/19 0855  BP: (!) 150/77   Pulse: 85 83  Resp:  20  Temp: 98.8 F (37.1 C)   SpO2: 99% 98%    Physical Exam: Awake and alert Nonlabored respirations Stable swelling left lower extremity Foley catheter with gross hematuria  CBC    Component Value Date/Time   WBC 8.0 06/11/2019 0714   RBC 2.92 (L) 06/11/2019 0714   HGB 8.3 (L) 06/11/2019 0714   HCT 24.7 (L) 06/11/2019 0714   PLT 344 06/11/2019 0714   MCV 84.6 06/11/2019 0714   MCH 28.4 06/11/2019 0714   MCHC 33.6 06/11/2019 0714   RDW 16.2 (H) 06/11/2019 0714   LYMPHSABS 0.2 (L) 06/09/2019 1425   MONOABS 0.7 06/09/2019 1425   EOSABS 0.0 06/09/2019 1425   BASOSABS 0.0 06/09/2019 1425    BMET    Component Value Date/Time   NA 142 06/11/2019 0714   K 3.3 (L) 06/11/2019 0714   CL 104 06/11/2019 0714   CO2 26 06/11/2019 0714   GLUCOSE 92 06/11/2019 0714   BUN 32 (H) 06/11/2019 0714   CREATININE 1.59 (H) 06/11/2019 0714   CALCIUM 8.0 (L) 06/11/2019 0714   GFRNONAA 41 (L) 06/11/2019 0714   GFRAA 48 (L) 06/11/2019 0714    INR    Component Value Date/Time   INR 2.6 (H) 06/09/2019 1425     Intake/Output Summary (Last 24 hours) at 06/11/2019 1125 Last data filed at 06/11/2019 0500 Gross per 24 hour  Intake 3162.83 ml  Output 3600 ml  Net -437.17 ml     Assessment/plan:  79 y.o. male is here with acute kidney injury likely secondary to Foley catheter occlusion from clot.  Has persistent hematuria with extensive left lower extremity DVT.  Plan for IVC filter placement today.   Austynn Pridmore C. Donzetta Matters, MD [facility] [place] Office: [phone] Pager: [phone]  06/11/2019 11:25 AM

## 2019-06-11 NOTE — Plan of Care (Signed)
  Problem: Clinical Measurements: Goal: Cardiovascular complication will be avoided Outcome: Progressing   Problem: Activity: Goal: Risk for activity intolerance will decrease Outcome: Progressing   Problem: Elimination: Goal: Will not experience complications related to bowel motility Outcome: Progressing Goal: Will not experience complications related to urinary retention Outcome: Progressing   Problem: Safety: Goal: Ability to remain free from injury will improve Outcome: Progressing   Problem: Skin Integrity: Goal: Risk for impaired skin integrity will decrease Outcome: Progressing

## 2019-06-11 NOTE — Op Note (Signed)
    Patient name: Henry Carter MRN: [id number] DOB: 05-Oct-1940 Sex: male  06/11/2019 Pre-operative Diagnosis: dvt with contraindication to anticoagulation Post-operative diagnosis:  Same Surgeon:  Eda Paschal. Donzetta Matters, MD Procedure Performed: 1.  Ultrasound-guided cannulation right common femoral vein 2.  IVC venogram 3.  Placement of Cook Celect filter   Indications: 79 year old male with extensive left lower extremity DVT.  He has hematuria now is indicated for IVC filter placement.  Findings: IVC was patent less than 30 mm in diameter.  Filter was placed in the infrarenal position.   Procedure:  The patient was identified in the holding area and taken to room 8.  The patient was then placed supine on the table and prepped and draped in the usual sterile fashion.  A time out was called.  Ultrasound was used to evaluate the right common femoral vein which was patent and compressible.  The area was anesthetized 1% lidocaine cannulated with direct ultrasound visualization with 18-gauge needle.  Images saved the permanent record.  Bentson wires placed centrally under fluoroscopic guidance.  The introducer sheath was placed.  Central venogram was performed with the above findings.  We then placed a filter just below the lowest right renal vein and deployed it.  Sheath and wire were removed.  He tolerated procedure without any complication.  Contrast: 30 cc    Mischa Pollard C. Donzetta Matters, MD [facility] [place] Office: [phone] Pager: [phone]

## 2019-06-12 LAB — BASIC METABOLIC PANEL
Anion gap: 8 (ref 5–15)
BUN: 18 mg/dL (ref 8–23)
CO2: 26 mmol/L (ref 22–32)
Calcium: 7.9 mg/dL — ABNORMAL LOW (ref 8.9–10.3)
Chloride: 100 mmol/L (ref 98–111)
Creatinine, Ser: 1.14 mg/dL (ref 0.61–1.24)
GFR calc Af Amer: >60 mL/min (ref >60)
GFR calc non Af Amer: >60 mL/min (ref >60)
Glucose, Bld: 93 mg/dL (ref 70–99)
Potassium: 3.6 mmol/L (ref 3.5–5.1)
Sodium: 134 mmol/L — ABNORMAL LOW (ref 135–145)

## 2019-06-12 LAB — CBC
HCT: 23.8 % — ABNORMAL LOW (ref 39.0–52.0)
Hemoglobin: 7.7 g/dL — ABNORMAL LOW (ref 13.0–17.0)
MCH: 28.9 pg (ref 26.0–34.0)
MCHC: 32.4 g/dL (ref 30.0–36.0)
MCV: 89.5 fL (ref 80.0–100.0)
Platelets: 368 10*3/uL (ref 150–400)
RBC: 2.66 MIL/uL — ABNORMAL LOW (ref 4.22–5.81)
RDW: 16.7 % — ABNORMAL HIGH (ref 11.5–15.5)
WBC: 9.5 10*3/uL (ref 4.0–10.5)
nRBC: 0 % (ref 0.0–0.2)

## 2019-06-12 MED ORDER — PANTOPRAZOLE SODIUM 40 MG PO TBEC
40.0000 mg | DELAYED_RELEASE_TABLET | Freq: Two times a day (BID) | ORAL | Status: DC
  Administered 2019-06-13 – 2019-06-15 (×6): 40 mg via ORAL
  Filled 2019-06-12 (×6): qty 1

## 2019-06-12 NOTE — Care Management Important Message (Signed)
Important Message  Patient Details  Name: Henry Carter MRN: [id number] Date of Birth: 12-12-1940   Medicare Important Message Given:  Yes  Due to illness patient did not sign.  Copy of IM given   Bree Heinzelman 06/12/2019, 3:10 PM

## 2019-06-12 NOTE — Progress Notes (Signed)
  Progress Note    06/12/2019 1:57 PM 1 Day Post-Op  Subjective: No complaints this morning  Vitals:   06/12/19 0824 06/12/19 1158  BP: (!) 144/83 (!) 144/78  Pulse: 67 71  Resp: 14 16  Temp: 98.9 F (37.2 C) 98.6 F (37 C)  SpO2: 100% 100%    Physical Exam: He is awake and alert He is noncompliant with allowing me to check his groin access site Abdomen is soft and nontender  CBC    Component Value Date/Time   WBC 9.5 06/12/2019 0544   RBC 2.66 (L) 06/12/2019 0544   HGB 7.7 (L) 06/12/2019 0544   HCT 23.8 (L) 06/12/2019 0544   PLT 368 06/12/2019 0544   MCV 89.5 06/12/2019 0544   MCH 28.9 06/12/2019 0544   MCHC 32.4 06/12/2019 0544   RDW 16.7 (H) 06/12/2019 0544   LYMPHSABS 0.2 (L) 06/09/2019 1425   MONOABS 0.7 06/09/2019 1425   EOSABS 0.0 06/09/2019 1425   BASOSABS 0.0 06/09/2019 1425    BMET    Component Value Date/Time   NA 134 (L) 06/12/2019 0544   K 3.6 06/12/2019 0544   CL 100 06/12/2019 0544   CO2 26 06/12/2019 0544   GLUCOSE 93 06/12/2019 0544   BUN 18 06/12/2019 0544   CREATININE 1.14 06/12/2019 0544   CALCIUM 7.9 (L) 06/12/2019 0544   GFRNONAA >60 06/12/2019 0544   GFRAA >60 06/12/2019 0544    INR    Component Value Date/Time   INR 2.6 (H) 06/09/2019 1425     Intake/Output Summary (Last 24 hours) at 06/12/2019 1357 Last data filed at 06/12/2019 0500 Gross per 24 hour  Intake 680 ml  Output 1650 ml  Net -970 ml     Assessment/plan:  79 y.o. male is s/p IVC filter placement for gross hematuria with extensive left lower extremity DVT.  I will have him follow-up in 3 to 4 months to evaluate for filter removal if he is tolerating anticoagulation at that time.  He should be restarted on anticoagulation when bleeding is stopped and when it is considered safe from urology and primary team perspective.    Simon Llamas C. Donzetta Matters, MD [facility] [place] Office: [phone] Pager: [phone]  06/12/2019 1:57 PM

## 2019-06-12 NOTE — Progress Notes (Signed)
PROGRESS NOTE    Henry Carter  [id number] DOB: [birth year] DOA: 06/09/2019 PCP: [name], MD   Brief Narrative:  HPI: Henry Carter is a 79 y.o. male with medical history significant of iron deficiency anemia, BPH, COPD, hypertension, GERD, hyperlipidemia, PVD, history of DVT on anticoagulation, has a chronic indwelling foley catheter for BPH presenting to the ED for evaluation of altered mental status and decreased p.o. intake.  Patient's wife reported to ED provider that he has not been eating/drinking or taking his medications for the last 9 days.  He fell 2 days ago while getting out of bed and hit his head on the carpet.  No loss of consciousness reported.  He has been confused.  He has had bleeding from his Foley for the last 7 to 8 days.  Patient is confused and not sure why he is here.  His only complaint is suprapubic pain.  Denies chest pain, shortness of breath, nausea, vomiting, or diarrhea.  No additional history could be obtained from him.  ED Course: Afebrile.  Slightly tachycardic.  Labs showing no leukocytosis.  Lactic acid mildly elevated at 2.2.  Hemoglobin 6.4, was in the 10-11 range on recent labs.  FOBT positive.  No grossly bloody or melanotic stool noted on rectal exam.  Sodium 127, potassium 6.2, bicarb 11, anion gap 19, glucose 129.  BUN 140, creatinine 10.7.  Baseline creatinine 0.9.  LFTs normal.  Blood culture x2 pending.  High-sensitivity troponin negative.  CK normal.  SARS-CoV-2 rapid antigen test negative, PCR test pending.  UA uninterpretable due to gross hematuria. Microscopic examination showing greater than 50 RBCs, 0-5 WBCs, and many bacteria.  Urine culture pending. Chest x-ray showing emphysema and no active cardiopulmonary process. Head CT negative for acute intracranial abnormality.  Showing stable appearance of the lateral ventricles out of proportion to the degree of cerebral atrophy, compatible with normal pressure  hydrocephalus.  Patient received IV Protonix 40 mg and 1 L normal saline bolus.  1 unit PRBCs ordered.  Assessment & Plan:   Principal Problem:   Hematuria Active Problems:   Acute renal failure (ARF) (HCC)   Acute blood loss anemia   Hyperkalemia   Metabolic acidosis  Gross hematuria: Patient has a chronic indwelling Foley catheter in the setting of BPH.  Urology consulted.  Foley catheter was replaced.  CT abdomen pelvis showing bilateral obstructive uropathy, left greater than right, which may be due to bladder distention and chronic bladder outlet obstruction.  No evidence of urinary tract calculi.  Distended bladder and enlarged prostate.  Urine has improved and is close to normal color now.  Urine culture growing Carney bacterium diphtheria.  Discussed with Dr. Graylon Good from ID, she agrees that this is likely skin contamination due to the fact that patient has improved while being off of any antibiotics.  Management deferred to urology.  Acute kidney injury/obstructive uropathy: Likely secondary to dehydration as well as obstructive uropathy due to blood clots.  Excellent urine output and dramatic improvement in creatinine. Renal function back to baseline. Nephrology signed off.  Acute blood loss anemia: Secondary to gross hematuria.  Hemoglobin of 6.4.  Received 1 unit of PRBC transfusion on 06/09/2019.  Hemoglobin 7.8 improved and then dropped to 6.9 and he received another transfusion on 06/10/2019. Posttransfusion hemoglobin 8.3>8.4>7.7.  Hyperkalemia: Received insulin/D50, Kayexalate and Lokelma.  Resolved.  Hypokalemia: Resolved  Severe metabolic acidosis: Resolved.  Mild acute hyponatremia: Resolved.  Acute metabolic encephalopathy: Likely secondary to acute kidney injury. He is  completely alert and oriented x4 today.  Positive FOBT: Suspicion that this may be contaminated from cross hematuria.  No history of hematochezia or black stool.  He received 1 dose of IV PPI in the  ED.  Mild lactic acidosis: Resolved.  Physical deconditioning/recent fall: PT OT seeing patient.  They recommend CIR.  CIR consulted.  History of normal pressure hydrocephalus: Head CT showing stable appearance of the lateral ventricles out of proportion to the degree of cerebral atrophy, compatible with normal pressure hydrocephalus.  No acute issues.  No neurology consultation indicated.  Recently diagnosed DVT 05/11/2019:  Xarelto on hold.  With his gross hematuria causing acute blood loss anemia, I doubt that he will be candidate for anticoagulation anytime soon.  Called and discussed with vascular surgery, Dr. Donzetta Matters and he agrees that he might be a good candidate for IVC filter. Patient underwent IVC filter placement on 06/11/2019  Essential hypertension: Blood pressure stable.  Not on any antihypertensives at home.  AAA: CT showing a 4 cm infrarenal abdominal aortic aneurysm.  Radiologist recommending follow-up ultrasound in 1 year per ACR consensus guidelines.  Blood culture positive: Only one of the 4 bottles is growing gram-positive rods and gram-positive cocci.  Likely contaminant.  Monitor off of antibiotics.  Follow final results.  DVT prophylaxis: SCD Code Status: Full code Family Communication:  None present at bedside.  Patient is from: Home Disposition Plan: To be determined Barriers to discharge: Recurrent acute blood loss anemia. Insurance authorization to inpatient rehab.   Estimated body mass index is 20.92 kg/m as calculated from the following:   Height as of this encounter: 5\' 11"  (1.803 m).   Weight as of this encounter: 68 kg.      Nutritional status:               Consultants:   Urology and nephrology and vascular surgery  Procedures:   None  Antimicrobials:   None   Subjective: Seen and examined. He has no complaints. He was alert and oriented x4 today.  Objective: Vitals:   06/11/19 1952 06/11/19 2100 06/11/19 2321 06/12/19 0824   BP: (!) 141/70 (!) 176/86 (!) 152/86 (!) 144/83  Pulse: 88 74  67  Resp: 20   14  Temp: 98.2 F (36.8 C) 98.8 F (37.1 C)  98.9 F (37.2 C)  TempSrc:  Oral  Oral  SpO2: 98% 100%  100%  Weight:      Height:        Intake/Output Summary (Last 24 hours) at 06/12/2019 1137 Last data filed at 06/12/2019 0500 Gross per 24 hour  Intake 680 ml  Output 1650 ml  Net -970 ml   Filed Weights   06/09/19 1412  Weight: 68 kg    Examination:  General exam: Appears calm and comfortable  Respiratory system: Clear to auscultation. Respiratory effort normal. Cardiovascular system: S1 & S2 heard, RRR. No JVD, murmurs, rubs, gallops or clicks. No pedal edema. Gastrointestinal system: Abdomen is nondistended, soft and nontender. No organomegaly or masses felt. Normal bowel sounds heard. Central nervous system: Alert and oriented. No focal neurological deficits. Extremities: Symmetric 5 x 5 power. Skin: No rashes, lesions or ulcers.  Psychiatry: Judgement and insight appear normal. Mood & affect appropriate.    Data Reviewed: I have personally reviewed following labs and imaging studies  CBC: Recent Labs  Lab 06/09/19 1425 06/09/19 2137 06/10/19 0525 06/10/19 1810 06/11/19 0714 06/11/19 1831 06/12/19 0544  WBC 8.7  --   --  8.4  8.0 9.4 9.5  NEUTROABS 7.7  --   --   --   --   --   --   HGB 6.4*   < > 7.8* 6.9* 8.3* 8.4* 7.7*  HCT 19.5*   < > 22.5* 19.8* 24.7* 25.5* 23.8*  MCV 88.6  --   --  84.3 84.6 88.2 89.5  PLT 343  --   --  337 344 362 368   < > = values in this interval not displayed.   Basic Metabolic Panel: Recent Labs  Lab 06/09/19 2137 06/10/19 0525 06/10/19 1549 06/11/19 0714 06/12/19 0544  NA 127* 135 139 142 134*  K 6.4* 4.0 2.9* 3.3* 3.6  CL 98 106 105 104 100  CO2 11* 14* 22 26 26   GLUCOSE 98 81 127* 92 93  BUN 143* 103* 62* 32* 18  CREATININE 10.90* 6.36* 3.21* 1.59* 1.14  CALCIUM 8.6* 8.6* 8.0* 8.0* 7.9*   GFR: Estimated Creatinine Clearance: 52.2  mL/min (by C-G formula based on SCr of 1.14 mg/dL). Liver Function Tests: Recent Labs  Lab 06/09/19 1425  AST 23  ALT 25  ALKPHOS 66  BILITOT 1.0  PROT 5.6*  ALBUMIN 2.4*   No results for input(s): LIPASE, AMYLASE in the last 168 hours. Recent Labs  Lab 06/09/19 2137  AMMONIA 53*   Coagulation Profile: Recent Labs  Lab 06/09/19 1425  INR 2.6*   Cardiac Enzymes: Recent Labs  Lab 06/09/19 1425  CKTOTAL 81   BNP (last 3 results) No results for input(s): PROBNP in the last 8760 hours. HbA1C: No results for input(s): HGBA1C in the last 72 hours. CBG: Recent Labs  Lab 06/09/19 2229  GLUCAP 108*   Lipid Profile: No results for input(s): CHOL, HDL, LDLCALC, TRIG, CHOLHDL, LDLDIRECT in the last 72 hours. Thyroid Function Tests: Recent Labs    06/09/19 2137  TSH 1.102   Anemia Panel: Recent Labs    06/09/19 2137 06/10/19 0042  VITAMINB12 238  --   FERRITIN  --  86  TIBC  --  301  IRON  --  12*   Sepsis Labs: Recent Labs  Lab 06/09/19 1425 06/09/19 2137  LATICACIDVEN 2.2* 1.3    Recent Results (from the past 240 hour(s))  Culture, blood (Routine x 2)     Status: None (Preliminary result)   Collection Time: 06/09/19  2:25 PM   Specimen: BLOOD  Result Value Ref Range Status   Specimen Description BLOOD RIGHT ANTECUBITAL  Final   Special Requests   Final    BOTTLES DRAWN AEROBIC AND ANAEROBIC Blood Culture adequate volume   Culture  Setup Time   Final    GRAM POSITIVE RODS AEROBIC BOTTLE ONLY CRITICAL RESULT CALLED TO, READ BACK BY AND VERIFIED WITH: PHARMD J LADFORD [id number] AT 724 AM BY CM    Culture   Final    GRAM POSITIVE RODS CULTURE REINCUBATED FOR BETTER GROWTH Performed at [facility], [place] [address] [place], [place] [address]    Report Status PENDING  Incomplete  Culture, blood (Routine x 2)     Status: Abnormal   Collection Time: 06/09/19  2:44 PM   Specimen: BLOOD  Result Value Ref Range Status   Specimen Description BLOOD  LEFT ANTECUBITAL  Final   Special Requests   Final    BOTTLES DRAWN AEROBIC AND ANAEROBIC Blood Culture adequate volume   Culture  Setup Time   Final    GRAM POSITIVE COCCI IN CLUSTERS AEROBIC BOTTLE ONLY  CRITICAL RESULT CALLED TO, READ BACK BY AND VERIFIED WITH: PHARMD M MCCARTHY [id number] AT 22 BY CM    Culture (A)  Final    STAPHYLOCOCCUS SPECIES (COAGULASE NEGATIVE) THE SIGNIFICANCE OF ISOLATING THIS ORGANISM FROM A SINGLE SET OF BLOOD CULTURES WHEN MULTIPLE SETS ARE DRAWN IS UNCERTAIN. PLEASE NOTIFY THE MICROBIOLOGY DEPARTMENT WITHIN ONE WEEK IF SPECIATION AND SENSITIVITIES ARE REQUIRED. Performed at [facility], [place] [address] [place], [place] [address]    Report Status 06/11/2019 FINAL  Final  SARS CORONAVIRUS 2 (TAT 6-24 HRS) Nasopharyngeal Nasopharyngeal Swab     Status: None   Collection Time: 06/09/19  7:39 PM   Specimen: Nasopharyngeal Swab  Result Value Ref Range Status   SARS Coronavirus 2 NEGATIVE NEGATIVE Final    Comment: (NOTE) SARS-CoV-2 target nucleic acids are NOT DETECTED. The SARS-CoV-2 RNA is generally detectable in upper and lower respiratory specimens during the acute phase of infection. Negative results do not preclude SARS-CoV-2 infection, do not rule out co-infections with other pathogens, and should not be used as the sole basis for treatment or other patient management decisions. Negative results must be combined with clinical observations, patient history, and epidemiological information. The expected result is Negative. Fact Sheet for Patients: SugarRoll.be Fact Sheet for Healthcare Providers: https://www.woods-mathews.com/ This test is not yet approved or cleared by the [place] FDA and  has been authorized for detection and/or diagnosis of SARS-CoV-2 by FDA under an Emergency Use Authorization (EUA). This EUA will remain  in effect (meaning this test can be used) for the duration of  the COVID-19 declaration under Section 56 4(b)(1) of the Act, 21 U.S.C. section 360bbb-3(b)(1), unless the authorization is terminated or revoked sooner. Performed at [facility], [place] [address], [place], [place] [address]   Urine culture     Status: Abnormal   Collection Time: 06/09/19  9:59 PM   Specimen: Urine, Random  Result Value Ref Range Status   Specimen Description URINE, RANDOM  Final   Special Requests NONE  Final   Culture (A)  Final    >=100,000 COLONIES/mL DIPHTHEROIDS(CORYNEBACTERIUM SPECIES) Standardized susceptibility testing for this organism is not available. Performed at [facility], [place] [address], [place], [place] [address]    Report Status 06/11/2019 FINAL  Final      Radiology Studies: PERIPHERAL VASCULAR CATHETERIZATION  Result Date: 06/11/2019 Patient name: Henry Carter MRN: [id number] DOB: 06/23/1940 Sex: male 06/11/2019 Pre-operative Diagnosis: dvt with contraindication to anticoagulation Post-operative diagnosis:  Same Surgeon:  Eda Paschal. Donzetta Matters, MD Procedure Performed: 1.  Ultrasound-guided cannulation right common femoral vein 2.  IVC venogram 3.  Placement of [place] Celect filter Indications: 79 year old male with extensive left lower extremity DVT.  He has hematuria now is indicated for IVC filter placement. Findings: IVC was patent less than 30 mm in diameter.  Filter was placed in the infrarenal position.  Procedure:  The patient was identified in the holding area and taken to room 8.  The patient was then placed supine on the table and prepped and draped in the usual sterile fashion.  A time out was called.  Ultrasound was used to evaluate the right common femoral vein which was patent and compressible.  The area was anesthetized 1% lidocaine cannulated with direct ultrasound visualization with 18-gauge needle.  Images saved the permanent record.  Bentson wires placed centrally under fluoroscopic guidance.  The introducer sheath was placed.   Central venogram was performed with the above findings.  We then  placed a filter just below the lowest right renal vein and deployed it.  Sheath and wire were removed.  He tolerated procedure without any complication. Contrast: 30 cc Brandon C. Donzetta Matters, MD [facility] Office: [phone] Pager: [phone]   Scheduled Meds: . Chlorhexidine Gluconate Cloth  6 each Topical Daily  . influenza vaccine adjuvanted  0.5 mL Intramuscular Tomorrow-1000  . pantoprazole  40 mg Intravenous Q12H   Continuous Infusions:    LOS: 3 days   Time spent: 26 minutes   Darliss Cheney, MD [facility]  06/12/2019, 11:37 AM   To contact the attending provider between 7A-7P or the covering provider during after hours 7P-7A, please log into the web site [email or web address].

## 2019-06-12 NOTE — Progress Notes (Signed)
Physical Therapy Treatment Patient Details Name: Henry Carter MRN: [id number] DOB: [birth year] Today's Date: 06/12/2019    History of Present Illness Pt is a 79 y.o. male admitted 06/09/19 with AMS and decreased p.o. intake, wife reports pt also fell out of bed hitting head, no LOC. Head CT negative for acute intracranial abnormality. Worked up for [employer], acute renal failure, anemia, severe metabolic acidosis. s/p IVC filter placement via R groin on 3/1 for DVT. Found to have bladder distention. PMH includes COPD, HTN, PVD, DVT on anticoagulation, BPH with chronic indwelling catheter.    PT Comments    Pt fatigued and agreeable to bedlevel exercises today. Pt limited on RLE secondary to IVC procedure yesterday, required active assist to perform LE exercises. Pt also required max assist for repositioning this session. VSS, will continue to follow acutely.     Follow Up Recommendations  CIR;Supervision/Assistance - 24 hour     Equipment Recommendations  Other (comment)(defer .)    Recommendations for Other Services       Precautions / Restrictions Precautions Precautions: Fall;Other (comment) Precaution Comments: Chronic indwelling folley catheter; bowel incontinence Restrictions Weight Bearing Restrictions: No    Mobility  Bed Mobility Overal bed mobility: Needs Assistance             General bed mobility comments: max assist for scooting up with bed with flat HOB and use of bed pads, pt assisting with UE placement on bedrails and tucking chin.  Transfers                 General transfer comment: pt declines  Ambulation/Gait             General Gait Details: pt declines   Stairs             Wheelchair Mobility    Modified Rankin (Stroke Patients Only)       Balance                                            Cognition Arousal/Alertness: Awake/alert Behavior During Therapy: WFL for tasks assessed/performed Overall  Cognitive Status: Impaired/Different from baseline Area of Impairment: Attention;Memory;Following commands;Safety/judgement;Awareness;Problem solving                   Current Attention Level: Sustained Memory: Decreased short-term memory Following Commands: Follows one step commands with increased time;Follows one step commands consistently Safety/Judgement: Decreased awareness of safety;Decreased awareness of deficits Awareness: Intellectual Problem Solving: Slow processing;Decreased initiation;Difficulty sequencing;Requires verbal cues;Requires tactile cues General Comments: Pt A&O today, STM deficits present (asking to remove SpO2 monitor x3, even with explanation). Follows commands well, but cuing must be in close proximity to action or pt forgets.      Exercises General Exercises - Lower Extremity Ankle Circles/Pumps: AROM;Both;15 reps;Supine Gluteal Sets: AROM;Both;15 reps;Supine Short Arc Quad: AROM;Both;15 reps;Supine Heel Slides: AROM;Left;AAROM;Right;10 reps;Supine Hip ABduction/ADduction: AROM;Left;5 reps;Supine Straight Leg Raises: AROM;Left;AAROM;Right;10 reps;Supine    General Comments        Pertinent Vitals/Pain Pain Assessment: Faces Faces Pain Scale: Hurts little more Pain Location: R groin, s/p IVC filter 3/1 Pain Descriptors / Indicators: Sore Pain Intervention(s): Limited activity within patient's tolerance;Monitored during session;Repositioned    Home Living                      Prior Function  PT Goals (current goals can now be found in the care plan section) Acute Rehab PT Goals Patient Stated Goal: None stated PT Goal Formulation: With patient/family Time For Goal Achievement: 06/24/19 Progress towards PT goals: Progressing toward goals    Frequency    Min 3X/week      PT Plan Current plan remains appropriate    Co-evaluation              AM-PAC PT "6 Clicks" Mobility   Outcome Measure  Help needed  turning from your back to your side while in a flat bed without using bedrails?: A Lot Help needed moving from lying on your back to sitting on the side of a flat bed without using bedrails?: Total Help needed moving to and from a bed to a chair (including a wheelchair)?: Total Help needed standing up from a chair using your arms (e.g., wheelchair or bedside chair)?: Total Help needed to walk in hospital room?: Total Help needed climbing 3-5 steps with a railing? : Total 6 Click Score: 7    End of Session   Activity Tolerance: Patient limited by fatigue Patient left: in bed;with call bell/phone within reach;with bed alarm set;with family/visitor present Nurse Communication: Mobility status PT Visit Diagnosis: Other abnormalities of gait and mobility (R26.89);Muscle weakness (generalized) (M62.81)     Time: [id number] PT Time Calculation (min) (ACUTE ONLY): 14 min  Charges:  $Therapeutic Exercise: 8-22 mins                     Hyrum Shaneyfelt E, PT Acute Rehabilitation Services Pager [phone]  Office [phone]    Chantille Navarrete D Elonda Husky 06/12/2019, 5:15 PM

## 2019-06-13 DIAGNOSIS — I10 Essential (primary) hypertension: Secondary | ICD-10-CM

## 2019-06-13 DIAGNOSIS — D62 Acute posthemorrhagic anemia: Secondary | ICD-10-CM

## 2019-06-13 DIAGNOSIS — N139 Obstructive and reflux uropathy, unspecified: Secondary | ICD-10-CM

## 2019-06-13 DIAGNOSIS — E875 Hyperkalemia: Secondary | ICD-10-CM

## 2019-06-13 DIAGNOSIS — N179 Acute kidney failure, unspecified: Secondary | ICD-10-CM

## 2019-06-13 DIAGNOSIS — E871 Hypo-osmolality and hyponatremia: Secondary | ICD-10-CM

## 2019-06-13 DIAGNOSIS — E876 Hypokalemia: Secondary | ICD-10-CM

## 2019-06-13 DIAGNOSIS — G9341 Metabolic encephalopathy: Secondary | ICD-10-CM

## 2019-06-13 DIAGNOSIS — E872 Acidosis: Secondary | ICD-10-CM

## 2019-06-13 LAB — CBC
HCT: 25 % — ABNORMAL LOW (ref 39.0–52.0)
Hemoglobin: 8.2 g/dL — ABNORMAL LOW (ref 13.0–17.0)
MCH: 29.1 pg (ref 26.0–34.0)
MCHC: 32.8 g/dL (ref 30.0–36.0)
MCV: 88.7 fL (ref 80.0–100.0)
Platelets: 418 10*3/uL — ABNORMAL HIGH (ref 150–400)
RBC: 2.82 MIL/uL — ABNORMAL LOW (ref 4.22–5.81)
RDW: 16.6 % — ABNORMAL HIGH (ref 11.5–15.5)
WBC: 11.8 10*3/uL — ABNORMAL HIGH (ref 4.0–10.5)
nRBC: 0 % (ref 0.0–0.2)

## 2019-06-13 LAB — TYPE AND SCREEN
ABO/RH(D): O POS
Antibody Screen: NEGATIVE
Unit division: 0
Unit division: 0
Unit division: 0
Unit division: 0

## 2019-06-13 LAB — BPAM RBC
Blood Product Expiration Date: 202103282359
Blood Product Expiration Date: 202103282359
Blood Product Expiration Date: 202103282359
Blood Product Expiration Date: 202103282359
ISSUE DATE / TIME: 202102271552
ISSUE DATE / TIME: 202102281948
Unit Type and Rh: 5100
Unit Type and Rh: 5100
Unit Type and Rh: 5100
Unit Type and Rh: 5100

## 2019-06-13 LAB — BASIC METABOLIC PANEL
Anion gap: 9 (ref 5–15)
BUN: 12 mg/dL (ref 8–23)
CO2: 26 mmol/L (ref 22–32)
Calcium: 7.9 mg/dL — ABNORMAL LOW (ref 8.9–10.3)
Chloride: 95 mmol/L — ABNORMAL LOW (ref 98–111)
Creatinine, Ser: 1.07 mg/dL (ref 0.61–1.24)
GFR calc Af Amer: >60 mL/min (ref >60)
GFR calc non Af Amer: >60 mL/min (ref >60)
Glucose, Bld: 89 mg/dL (ref 70–99)
Potassium: 3.1 mmol/L — ABNORMAL LOW (ref 3.5–5.1)
Sodium: 130 mmol/L — ABNORMAL LOW (ref 135–145)

## 2019-06-13 MED ORDER — AMLODIPINE BESYLATE 5 MG PO TABS
5.0000 mg | ORAL_TABLET | Freq: Every day | ORAL | Status: DC
  Administered 2019-06-13 – 2019-06-14 (×2): 5 mg via ORAL
  Filled 2019-06-13 (×2): qty 1

## 2019-06-13 MED ORDER — POTASSIUM CHLORIDE CRYS ER 20 MEQ PO TBCR
40.0000 meq | EXTENDED_RELEASE_TABLET | ORAL | Status: AC
  Administered 2019-06-13 (×2): 40 meq via ORAL
  Filled 2019-06-13: qty 2

## 2019-06-13 MED ORDER — SODIUM CHLORIDE 0.9 % IV SOLN
INTRAVENOUS | Status: AC
  Administered 2019-06-14: 1000 mL via INTRAVENOUS

## 2019-06-13 NOTE — Progress Notes (Signed)
Occupational Therapy Treatment Patient Details Name: Henry Carter MRN: [id number] DOB: 09/08/1940 Today's Date: 06/13/2019    History of present illness Pt is a 79 y.o. male admitted 06/09/19 with AMS and decreased p.o. intake, wife reports pt also fell out of bed hitting head, no LOC. Head CT negative for acute intracranial abnormality. Worked up for [employer], acute renal failure, anemia, severe metabolic acidosis. s/p IVC filter placement via R groin on 3/1 for DVT. Found to have bladder distention. PMH includes COPD, HTN, PVD, DVT on anticoagulation, BPH with chronic indwelling catheter.   OT comments  Pt needing maximum encouragement of therapist and daughter, but then only agreeable to transfer to chair and participate in seated grooming tasks. VSS on RA. Updated d/c plan to SNF, unsure pt could tolerate intensive rehab.  Follow Up Recommendations  SNF;Supervision/Assistance - 24 hour    Equipment Recommendations  Other (comment)(defer to next venue)    Recommendations for Other Services      Precautions / Restrictions Precautions Precautions: Fall Precaution Comments: Chronic indwelling folley catheter; bowel incontinence       Mobility Bed Mobility Overal bed mobility: Needs Assistance Bed Mobility: Supine to Sit     Supine to sit: Min assist     General bed mobility comments: increased time, pulled up on daughter's hand  Transfers Overall transfer level: Needs assistance Equipment used: Rolling walker (2 wheeled) Transfers: Sit to/from Stand Sit to Stand: +2 physical assistance;Min assist         General transfer comment: assist to rise, cues for hand placement    Balance Overall balance assessment: Needs assistance   Sitting balance-Leahy Scale: Fair     Standing balance support: Bilateral upper extremity supported Standing balance-Leahy Scale: Poor Standing balance comment: reliant on RW                           ADL either performed  or assessed with clinical judgement   ADL Overall ADL's : Needs assistance/impaired Eating/Feeding: Set up;Bed level   Grooming: Oral care;Wash/dry hands;Wash/dry face;Sitting;Set up Grooming Details (indicate cue type and reason): in sitting         Upper Body Dressing : Minimal assistance;Sitting Upper Body Dressing Details (indicate cue type and reason): front opening gown Lower Body Dressing: Total assistance;Sitting/lateral leans Lower Body Dressing Details (indicate cue type and reason): for socks                     Vision       Perception     Praxis      Cognition Arousal/Alertness: Awake/alert Behavior During Therapy: WFL for tasks assessed/performed                                 Problem Solving: Slow processing;Decreased initiation;Difficulty sequencing;Requires verbal cues General Comments: Pt with poor insight into benefits of OOB activity in preparation for return home.        Exercises     Shoulder Instructions       General Comments      Pertinent Vitals/ Pain       Pain Assessment: Faces Faces Pain Scale: Hurts a little bit Pain Location: R hip Pain Descriptors / Indicators: Sore Pain Intervention(s): Monitored during session  Home Living  Prior Functioning/Environment              Frequency  Min 2X/week        Progress Toward Goals  OT Goals(current goals can now be found in the care plan section)  Progress towards OT goals: Not progressing toward goals - comment(pt needing maximum encouragement)  Acute Rehab OT Goals Patient Stated Goal: to take a nap OT Goal Formulation: With patient Time For Goal Achievement: 06/25/19 Potential to Achieve Goals: Good  Plan Discharge plan needs to be updated    Co-evaluation                 AM-PAC OT "6 Clicks" Daily Activity     Outcome Measure   Help from another person eating meals?: A  Little Help from another person taking care of personal grooming?: A Little Help from another person toileting, which includes using toliet, bedpan, or urinal?: A Lot Help from another person bathing (including washing, rinsing, drying)?: A Lot Help from another person to put on and taking off regular upper body clothing?: A Little Help from another person to put on and taking off regular lower body clothing?: A Lot 6 Click Score: 15    End of Session Equipment Utilized During Treatment: Rolling walker  OT Visit Diagnosis: Unsteadiness on feet (R26.81);Muscle weakness (generalized) (M62.81)   Activity Tolerance Patient tolerated treatment well   Patient Left in chair;with call bell/phone within reach;with family/visitor present   Nurse Communication Mobility status        Time: 1426-1501 OT Time Calculation (min): 35 min  Charges: OT General Charges $OT Visit: 1 Visit OT Treatments $Self Care/Home Management : 8-22 mins $Therapeutic Activity: 8-22 mins  Nestor Lewandowsky, OTR/L Acute Rehabilitation Services Pager: [phone] Office: [phone]   Malka So 06/13/2019, 3:12 PM

## 2019-06-13 NOTE — Progress Notes (Signed)
PROGRESS NOTE    Henry Carter  PXT:[id number] DOB: May 04, 1940 DOA: 06/09/2019 PCP: [name], MD    Brief Narrative:  HPI per Dr. Sherron Monday [facility] a 79 y.o.malewith medical history significant ofiron deficiency anemia, BPH, COPD, hypertension, GERD, hyperlipidemia, PVD, history of DVT on anticoagulation, has a chronic indwelling foley catheter for BPHpresenting to the ED for evaluation of altered mental status and decreased p.o. intake. Patient's wife reported to ED provider that he has not been eating/drinking or taking his medications for the last 9 days. He fell 2 days ago while getting out of bed and hit his head on the carpet. No loss of consciousness reported. He has been confused. He has had bleeding from his Foley for the last 7 to 8 days.Patient is confused and not sure why he is here. His only complaint is suprapubic pain. Denies chest pain, shortness of breath, nausea, vomiting, or diarrhea. No additional history could be obtained from him.  ED Course:Afebrile. Slightly tachycardic. Labs showing no leukocytosis. Lactic acid mildly elevated at 2.2. Hemoglobin 6.4, was in the 10-11 range on recent labs. FOBT positive. No grossly bloody or melanotic stool noted on rectal exam. Sodium 127, potassium 6.2, bicarb 11, anion gap 19, glucose 129. BUN 140, creatinine 10.7. Baseline creatinine 0.9. LFTs normal. Blood culture x2 pending. High-sensitivity troponin negative. CK normal. SARS-CoV-2 rapid antigen test negative, PCR test pending. UA uninterpretable due to gross hematuria. Microscopic examination showing greater than 50 RBCs, 0-5 WBCs, and many bacteria. Urine culture pending. Chest x-ray showing emphysema and no active cardiopulmonary process. Head CT negative for acute intracranial abnormality. Showing stable appearance of the lateral ventricles out of proportion to the degree of cerebral atrophy, compatible with normal pressure  hydrocephalus.  Patient received IV Protonix 40 mg and 1 L normal saline bolus. 1 unit PRBCs ordered.  Assessment & Plan:   Principal Problem:   Hematuria Active Problems:   Acute renal failure (ARF) (HCC)   Acute blood loss anemia   Hyperkalemia   Metabolic acidosis   Acute bilateral obstructive uropathy   Hypokalemia   Acute metabolic encephalopathy   Hyponatremia  1 gross hematuria Felt secondary to obstructed Foley catheter in the setting of chronic Foley catheter.  CT abdomen and pelvis done on admission bilateral obstructive uropathy left greater than right secondary to bladder distention and chronic bladder outlet obstruction, no evidence of urinary tract calculi, enlarged prostate, distended bladder despite Foley catheter, 4 cm infrarenal AAA.  Clinical improvement.  Foley bag with almost clear urine.  Patient seen in consultation by urology and Foley catheter exchanged and currently working.  Urinalysis done with urine cultures were positive for Corynebacterium diphtheria.  Dr. Doristine Bosworth discussed with ID, Dr. Baxter Flattery was felt likely secondary to a skin contamination as patient improved off of antibiotics.  Will likely be discharged with Foley catheter in place.  Outpatient follow-up with urology.  2.  Acute renal failure secondary to obstructive uropathy Patient had presented with acute renal failure with creatinine going up as high as 10.79 with a BUN of 140 and a bicarb of 11 with a potassium of 6.2.  CT abdomen and pelvis which was done with bilateral obstructive uropathy.  Foley catheter was replaced with good urine output.  Patient is -5.1 L during this hospitalization.  Nephrology was consulted and felt patient's acute renal failure secondary to a prerenal azotemia secondary to significant dehydration in setting of bilateral hydronephrosis.  Patient was placed on IV fluids with bicarb drip which has  subsequently been discontinued.  Patient improving clinically.  Creatinine  currently at 1.07.  Outpatient follow-up with urology.  3.  Hypokalemia K. Dur 40 mEq p.o. every 4 hours x2 doses.  4.  Hyperkalemia Secondary to problem #2.  Status post insulin/D50, Kayexalate and Lokelma.  Follow.  5.  Acute metabolic encephalopathy Likely secondary to problem #2.  Improved with improvement with renal function.  Likely close to baseline.  6.  Severe metabolic acidosis Secondary to acute renal failure.  Patient did receive a bicarb drip.  Resolved.  7.  Acute blood loss anemia Secondary to gross hematuria.  Hemoglobin was 6.4 on admission.  Status post 2 units packed red blood cells during this hospitalization.  Hemoglobin currently at 8.2.  Follow.  8.  Hyponatremia We will place on gentle hydration.  Follow.  9.  Slightly diagnosed DVT 05/11/2019 Xarelto was held secondary to gross hematuria leading to acute blood loss anemia.  Patient was seen in consultation by vascular surgery and underwent IVC filter placement on 05/19/7410 without complications.  Will defer resumption of anticoagulation to urology.  Continue to hold Xarelto for now.  10.  History of normal pressure hydrocephalus Head CT with stable appearance of lateral ventricles out of proportion to degree of cerebral atrophy, compatible with normal pressure hydrocephalus with no acute issues.  Outpatient follow-up.  11.  Hypertension Patient noted not to be on any antihypertensive medications at home.  Will start Norvasc 5 mg daily and uptitrate as needed.  12.  AAA CT abdomen and pelvis did show a 4 cm infrarenal AAA.  Follow-up ultrasound recommended in 1 year.  13.  1/4+ blood cultures Likely a contaminant.  No need for antibiotics at this time.  Follow.     DVT prophylaxis: SCDs Code Status: Full Family Communication: Updated patient.  No family at bedside. Disposition Plan:  . Patient came from: Home            . Anticipated d/c place: CIR versus SNF pending insurance  authorization. . Barriers to d/c OR conditions which need to be met to effect a safe d/c: CIR pending insurance authorization versus SNF.   Consultants:   Vascular surgery: Dr. Donzetta Matters 06/10/2019  Urology: Dr. Junious Silk 06/09/2019  Nephrology: Dr. Jonnie Finner 06/09/2019  Procedures:   CT abdomen and pelvis 06/09/2019  CT head 06/09/2019  Chest x-ray 06/09/2019  Ultrasound-guided cannulation right common femoral vein/IVC venogram/placement of Cook Celect filter per Dr. Donzetta Matters 06/11/2019  Transfusion 2 units packed red blood cells 06/09/2019, 06/10/2019  Antimicrobials:  None   Subjective: Patient laying in bed.  Sleeping but easily arousable.  Denies any further bleeding.  States he ate breakfast this morning.  No chest pain or shortness of breath.  Foley catheter clearing.  Objective: Vitals:   06/12/19 1925 06/12/19 2243 06/13/19 0735 06/13/19 1245  BP: (!) 178/89 (!) 176/88 (!) 172/83 (!) 154/88  Pulse: 76 75 77 87  Resp: '20 20 20 19  ' Temp: 99.3 F (37.4 C) 98.3 F (36.8 C) 99.2 F (37.3 C) 99.6 F (37.6 C)  TempSrc:      SpO2: 100% 100% 100% 100%  Weight:      Height:        Intake/Output Summary (Last 24 hours) at 06/13/2019 1647 Last data filed at 06/13/2019 1331 Gross per 24 hour  Intake --  Output 2600 ml  Net -2600 ml   Filed Weights   06/09/19 1412  Weight: 68 kg    Examination:  General exam: Appears calm  and comfortable  Respiratory system: Clear to auscultation.  No wheezes, no crackles, no rhonchi.  Respiratory effort normal. Cardiovascular system: RRR 3/6 SEM.  No JVD.  No lower extremity edema. Gastrointestinal system: Abdomen is nondistended, soft and nontender. No organomegaly or masses felt. Normal bowel sounds heard. Central nervous system: Alert and oriented. No focal neurological deficits. Extremities: Symmetric 5 x 5 power. Skin: No rashes, lesions or ulcers.  Right groin with dressing intact with no signs of infection.  Nontender to palpation in the  right groin. Psychiatry: Judgement and insight appear normal. Mood & affect appropriate.     Data Reviewed: I have personally reviewed following labs and imaging studies  CBC: Recent Labs  Lab 06/09/19 1425 06/09/19 2137 06/10/19 1810 06/11/19 0714 06/11/19 1831 06/12/19 0544 06/13/19 0751  WBC 8.7  --  8.4 8.0 9.4 9.5 11.8*  NEUTROABS 7.7  --   --   --   --   --   --   HGB 6.4*   < > 6.9* 8.3* 8.4* 7.7* 8.2*  HCT 19.5*   < > 19.8* 24.7* 25.5* 23.8* 25.0*  MCV 88.6  --  84.3 84.6 88.2 89.5 88.7  PLT 343  --  337 344 362 368 418*   < > = values in this interval not displayed.   Basic Metabolic Panel: Recent Labs  Lab 06/10/19 0525 06/10/19 1549 06/11/19 0714 06/12/19 0544 06/13/19 0751  NA 135 139 142 134* 130*  K 4.0 2.9* 3.3* 3.6 3.1*  CL 106 105 104 100 95*  CO2 14* '22 26 26 26  ' GLUCOSE 81 127* 92 93 89  BUN 103* 62* 32* 18 12  CREATININE 6.36* 3.21* 1.59* 1.14 1.07  CALCIUM 8.6* 8.0* 8.0* 7.9* 7.9*   GFR: Estimated Creatinine Clearance: 55.6 mL/min (by C-G formula based on SCr of 1.07 mg/dL). Liver Function Tests: Recent Labs  Lab 06/09/19 1425  AST 23  ALT 25  ALKPHOS 66  BILITOT 1.0  PROT 5.6*  ALBUMIN 2.4*   No results for input(s): LIPASE, AMYLASE in the last 168 hours. Recent Labs  Lab 06/09/19 2137  AMMONIA 53*   Coagulation Profile: Recent Labs  Lab 06/09/19 1425  INR 2.6*   Cardiac Enzymes: Recent Labs  Lab 06/09/19 1425  CKTOTAL 81   BNP (last 3 results) No results for input(s): PROBNP in the last 8760 hours. HbA1C: No results for input(s): HGBA1C in the last 72 hours. CBG: Recent Labs  Lab 06/09/19 2229  GLUCAP 108*   Lipid Profile: No results for input(s): CHOL, HDL, LDLCALC, TRIG, CHOLHDL, LDLDIRECT in the last 72 hours. Thyroid Function Tests: No results for input(s): TSH, T4TOTAL, FREET4, T3FREE, THYROIDAB in the last 72 hours. Anemia Panel: No results for input(s): VITAMINB12, FOLATE, FERRITIN, TIBC, IRON,  RETICCTPCT in the last 72 hours. Sepsis Labs: Recent Labs  Lab 06/09/19 1425 06/09/19 2137  LATICACIDVEN 2.2* 1.3    Recent Results (from the past 240 hour(s))  Culture, blood (Routine x 2)     Status: Abnormal (Preliminary result)   Collection Time: 06/09/19  2:25 PM   Specimen: BLOOD  Result Value Ref Range Status   Specimen Description BLOOD RIGHT ANTECUBITAL  Final   Special Requests   Final    BOTTLES DRAWN AEROBIC AND ANAEROBIC Blood Culture adequate volume   Culture  Setup Time   Final    GRAM POSITIVE RODS AEROBIC BOTTLE ONLY CRITICAL RESULT CALLED TO, READ BACK BY AND VERIFIED WITH: Dianna Rossetti [employer] [id number] AT  724 AM BY CM    Culture (A)  Final    DIPHTHEROIDS(CORYNEBACTERIUM SPECIES) Standardized susceptibility testing for this organism is not available. Performed at [facility], [place] [address] [place], [place] [address]    Report Status PENDING  Incomplete  Culture, blood (Routine x 2)     Status: Abnormal   Collection Time: 06/09/19  2:44 PM   Specimen: BLOOD  Result Value Ref Range Status   Specimen Description BLOOD LEFT ANTECUBITAL  Final   Special Requests   Final    BOTTLES DRAWN AEROBIC AND ANAEROBIC Blood Culture adequate volume   Culture  Setup Time   Final    GRAM POSITIVE COCCI IN CLUSTERS AEROBIC BOTTLE ONLY CRITICAL RESULT CALLED TO, READ BACK BY AND VERIFIED WITH: [place] [id number] AT 36 BY CM    Culture (A)  Final    STAPHYLOCOCCUS SPECIES (COAGULASE NEGATIVE) THE SIGNIFICANCE OF ISOLATING THIS ORGANISM FROM A SINGLE SET OF BLOOD CULTURES WHEN MULTIPLE SETS ARE DRAWN IS UNCERTAIN. PLEASE NOTIFY THE MICROBIOLOGY DEPARTMENT WITHIN ONE WEEK IF SPECIATION AND SENSITIVITIES ARE REQUIRED. Performed at [facility], [place] [address] [place], [place] [address]    Report Status 06/11/2019 FINAL  Final  SARS CORONAVIRUS 2 (TAT 6-24 HRS) Nasopharyngeal Nasopharyngeal Swab     Status: None   Collection Time: 06/09/19  7:39 PM    Specimen: Nasopharyngeal Swab  Result Value Ref Range Status   SARS Coronavirus 2 NEGATIVE NEGATIVE Final    Comment: (NOTE) SARS-CoV-2 target nucleic acids are NOT DETECTED. The SARS-CoV-2 RNA is generally detectable in upper and lower respiratory specimens during the acute phase of infection. Negative results do not preclude SARS-CoV-2 infection, do not rule out co-infections with other pathogens, and should not be used as the sole basis for treatment or other patient management decisions. Negative results must be combined with clinical observations, patient history, and epidemiological information. The expected result is Negative. Fact Sheet for Patients: SugarRoll.be Fact Sheet for Healthcare Providers: https://www.woods-mathews.com/ This test is not yet approved or cleared by the [place] FDA and  has been authorized for detection and/or diagnosis of SARS-CoV-2 by FDA under an Emergency Use Authorization (EUA). This EUA will remain  in effect (meaning this test can be used) for the duration of the COVID-19 declaration under Section 56 4(b)(1) of the Act, 21 U.S.C. section 360bbb-3(b)(1), unless the authorization is terminated or revoked sooner. Performed at [facility], [place] [address] [place], [place] [address]   Urine culture     Status: Abnormal   Collection Time: 06/09/19  9:59 PM   Specimen: Urine, Random  Result Value Ref Range Status   Specimen Description URINE, RANDOM  Final   Special Requests NONE  Final   Culture (A)  Final    >=100,000 COLONIES/mL DIPHTHEROIDS(CORYNEBACTERIUM SPECIES) Standardized susceptibility testing for this organism is not available. Performed at [facility], [place] [address], [place],  [address]    Report Status 06/11/2019 FINAL  Final         Radiology Studies: No results found.      Scheduled Meds: . amLODipine  5 mg Oral Daily  . Chlorhexidine Gluconate  Cloth  6 each Topical Daily  . influenza vaccine adjuvanted  0.5 mL Intramuscular Tomorrow-1000  . pantoprazole  40 mg Oral BID  . potassium chloride  40 mEq Oral Q4H   Continuous Infusions: . sodium chloride 75 mL/hr at 06/13/19 1423     LOS:  4 days    Time spent: 40 minutes    Irine Seal, MD [facility]   To contact the attending provider between 7A-7P or the covering provider during after hours 7P-7A, please log into the web site [email or web address] and access using universal [place] password for that web site. If you do not have the password, please call the hospital operator.  06/13/2019, 4:47 PM

## 2019-06-13 NOTE — Plan of Care (Signed)
  Problem: Education: Goal: Knowledge of General Education information will improve Description: Including pain rating scale, medication(s)/side effects and non-pharmacologic comfort measures 06/13/2019 0509 by Loma Messing, RN Outcome: Progressing 06/13/2019 0459 by Loma Messing, RN Outcome: Progressing 06/13/2019 0457 by Loma Messing, RN Outcome: Progressing   Problem: Health Behavior/Discharge Planning: Goal: Ability to manage health-related needs will improve 06/13/2019 0509 by Loma Messing, RN Outcome: Progressing 06/13/2019 0459 by Karsten Fells D, RN Outcome: Progressing 06/13/2019 0457 by Karsten Fells D, RN Outcome: Progressing   Problem: Clinical Measurements: Goal: Ability to maintain clinical measurements within normal limits will improve 06/13/2019 0509 by Loma Messing, RN Outcome: Progressing 06/13/2019 0459 by Karsten Fells D, RN Outcome: Progressing 06/13/2019 0457 by Karsten Fells D, RN Outcome: Progressing Goal: Will remain free from infection 06/13/2019 0509 by Karsten Fells D, RN Outcome: Progressing 06/13/2019 0459 by Karsten Fells D, RN Outcome: Progressing 06/13/2019 0457 by Karsten Fells D, RN Outcome: Progressing Goal: Diagnostic test results will improve 06/13/2019 0509 by Loma Messing, RN Outcome: Progressing 06/13/2019 0459 by Karsten Fells D, RN Outcome: Progressing 06/13/2019 0457 by Karsten Fells D, RN Outcome: Progressing Goal: Respiratory complications will improve 06/13/2019 0509 by Loma Messing, RN Outcome: Progressing 06/13/2019 0459 by Loma Messing, RN Outcome: Progressing 06/13/2019 0457 by Loma Messing, RN Outcome: Progressing Goal: Cardiovascular complication will be avoided 06/13/2019 0509 by Loma Messing, RN Outcome: Progressing 06/13/2019 0459 by Loma Messing, RN Outcome: Progressing 06/13/2019 0457 by Loma Messing, RN Outcome: Progressing   Problem:  Activity: Goal: Risk for activity intolerance will decrease 06/13/2019 0509 by Karsten Fells D, RN Outcome: Progressing 06/13/2019 0459 by Loma Messing, RN Outcome: Progressing 06/13/2019 0457 by Loma Messing, RN Outcome: Progressing   Problem: Nutrition: Goal: Adequate nutrition will be maintained 06/13/2019 0509 by Karsten Fells D, RN Outcome: Progressing 06/13/2019 0459 by Loma Messing, RN Outcome: Progressing 06/13/2019 0457 by Karsten Fells D, RN Outcome: Progressing   Problem: Coping: Goal: Level of anxiety will decrease 06/13/2019 0509 by Karsten Fells D, RN Outcome: Progressing 06/13/2019 0459 by Loma Messing, RN Outcome: Progressing 06/13/2019 0457 by Karsten Fells D, RN Outcome: Progressing   Problem: Elimination: Goal: Will not experience complications related to bowel motility 06/13/2019 0509 by Karsten Fells D, RN Outcome: Progressing 06/13/2019 0459 by Loma Messing, RN Outcome: Progressing 06/13/2019 0457 by Loma Messing, RN Outcome: Progressing Goal: Will not experience complications related to urinary retention 06/13/2019 0509 by Loma Messing, RN Outcome: Progressing 06/13/2019 0459 by Loma Messing, RN Outcome: Progressing 06/13/2019 0457 by Loma Messing, RN Outcome: Progressing   Problem: Pain Managment: Goal: General experience of comfort will improve 06/13/2019 0509 by Loma Messing, RN Outcome: Progressing 06/13/2019 0459 by Loma Messing, RN Outcome: Progressing 06/13/2019 0457 by Loma Messing, RN Outcome: Progressing   Problem: Safety: Goal: Ability to remain free from injury will improve 06/13/2019 0509 by Loma Messing, RN Outcome: Progressing 06/13/2019 0459 by Loma Messing, RN Outcome: Progressing 06/13/2019 0457 by Karsten Fells D, RN Outcome: Progressing   Problem: Skin Integrity: Goal: Risk for impaired skin integrity will decrease 06/13/2019 0509 by Loma Messing, RN Outcome: Progressing 06/13/2019 0459 by Loma Messing, RN Outcome: Progressing 06/13/2019 0457 by Loma Messing, RN Outcome: Progressing

## 2019-06-13 NOTE — Progress Notes (Signed)
Inpatient Rehab Admissions Coordinator:   Was able to reach pt's wife.  She and her daughters are able to provide 24/7 supervision to pt, if needed, at discharge.  Note encephalopathy is resolving so may be able to reach mod I goals.  Will need insurance authorization, which I will begin today.   Shann Medal, PT, DPT Admissions Coordinator [phone] 06/13/19  11:47 AM

## 2019-06-13 NOTE — Plan of Care (Signed)
Problem: Education: Goal: Knowledge of General Education information will improve Description: Including pain rating scale, medication(s)/side effects and non-pharmacologic comfort measures 06/13/2019 0459 by Loma Messing, RN Outcome: Progressing 06/13/2019 0457 by Loma Messing, RN Outcome: Progressing   Problem: Health Behavior/Discharge Planning: Goal: Ability to manage health-related needs will improve 06/13/2019 0459 by Loma Messing, RN Outcome: Progressing 06/13/2019 0457 by Loma Messing, RN Outcome: Progressing   Problem: Clinical Measurements: Goal: Ability to maintain clinical measurements within normal limits will improve 06/13/2019 0459 by Loma Messing, RN Outcome: Progressing 06/13/2019 0457 by Karsten Fells D, RN Outcome: Progressing Goal: Will remain free from infection 06/13/2019 0459 by Loma Messing, RN Outcome: Progressing 06/13/2019 0457 by Karsten Fells D, RN Outcome: Progressing Goal: Diagnostic test results will improve 06/13/2019 0459 by Loma Messing, RN Outcome: Progressing 06/13/2019 0457 by Karsten Fells D, RN Outcome: Progressing Goal: Respiratory complications will improve 06/13/2019 0459 by Loma Messing, RN Outcome: Progressing 06/13/2019 0457 by Karsten Fells D, RN Outcome: Progressing Goal: Cardiovascular complication will be avoided 06/13/2019 0459 by Karsten Fells D, RN Outcome: Progressing 06/13/2019 0457 by Loma Messing, RN Outcome: Progressing   Problem: Activity: Goal: Risk for activity intolerance will decrease 06/13/2019 0459 by Loma Messing, RN Outcome: Progressing 06/13/2019 0457 by Loma Messing, RN Outcome: Progressing   Problem: Nutrition: Goal: Adequate nutrition will be maintained 06/13/2019 0459 by Loma Messing, RN Outcome: Progressing 06/13/2019 0457 by Karsten Fells D, RN Outcome: Progressing   Problem: Coping: Goal: Level of anxiety will decrease 06/13/2019  0459 by Loma Messing, RN Outcome: Progressing 06/13/2019 0457 by Loma Messing, RN Outcome: Progressing   Problem: Elimination: Goal: Will not experience complications related to bowel motility 06/13/2019 0459 by Loma Messing, RN Outcome: Progressing 06/13/2019 0457 by Loma Messing, RN Outcome: Progressing Goal: Will not experience complications related to urinary retention 06/13/2019 0459 by Loma Messing, RN Outcome: Progressing 06/13/2019 0457 by Loma Messing, RN Outcome: Progressing   Problem: Pain Managment: Goal: General experience of comfort will improve 06/13/2019 0459 by Loma Messing, RN Outcome: Progressing 06/13/2019 0457 by Loma Messing, RN Outcome: Progressing   Problem: Safety: Goal: Ability to remain free from injury will improve 06/13/2019 0459 by Loma Messing, RN Outcome: Progressing 06/13/2019 0457 by Loma Messing, RN Outcome: Progressing   Problem: Skin Integrity: Goal: Risk for impaired skin integrity will decrease 06/13/2019 0459 by Loma Messing, RN Outcome: Progressing 06/13/2019 0457 by Loma Messing, RN Outcome: Progressing   Problem: Health Behavior/Discharge Planning: Goal: Ability to manage health-related needs will improve 06/13/2019 0459 by Loma Messing, RN Outcome: Progressing 06/13/2019 0457 by Loma Messing, RN Outcome: Progressing   Problem: Clinical Measurements: Goal: Ability to maintain clinical measurements within normal limits will improve 06/13/2019 0459 by Loma Messing, RN Outcome: Progressing 06/13/2019 0457 by Karsten Fells D, RN Outcome: Progressing   Problem: Clinical Measurements: Goal: Will remain free from infection 06/13/2019 0459 by Loma Messing, RN Outcome: Progressing 06/13/2019 0457 by Karsten Fells D, RN Outcome: Progressing   Problem: Clinical Measurements: Goal: Diagnostic test results will improve 06/13/2019 0459 by Loma Messing,  RN Outcome: Progressing 06/13/2019 0457 by Karsten Fells D, RN Outcome: Progressing   Problem: Clinical Measurements: Goal: Respiratory complications will improve 06/13/2019 0459 by Loma Messing, RN Outcome: Progressing 06/13/2019 0457 by Loma Messing, RN Outcome: Progressing   Problem: Activity: Goal: Risk for activity intolerance will decrease  06/13/2019 0459 by Karsten Fells D, RN Outcome: Progressing 06/13/2019 0457 by Loma Messing, RN Outcome: Progressing   Problem: Nutrition: Goal: Adequate nutrition will be maintained 06/13/2019 0459 by Loma Messing, RN Outcome: Progressing 06/13/2019 0457 by Karsten Fells D, RN Outcome: Progressing   Problem: Coping: Goal: Level of anxiety will decrease 06/13/2019 0459 by Loma Messing, RN Outcome: Progressing 06/13/2019 0457 by Karsten Fells D, RN Outcome: Progressing   Problem: Clinical Measurements: Goal: Cardiovascular complication will be avoided 06/13/2019 0459 by Loma Messing, RN Outcome: Progressing 06/13/2019 0457 by Loma Messing, RN Outcome: Progressing

## 2019-06-13 NOTE — Plan of Care (Signed)

## 2019-06-14 DIAGNOSIS — R509 Fever, unspecified: Secondary | ICD-10-CM

## 2019-06-14 LAB — CBC WITH DIFFERENTIAL/PLATELET
Abs Immature Granulocytes: 0.11 10*3/uL — ABNORMAL HIGH (ref 0.00–0.07)
Basophils Absolute: 0 10*3/uL (ref 0.0–0.1)
Basophils Relative: 0 %
Eosinophils Absolute: 0.2 10*3/uL (ref 0.0–0.5)
Eosinophils Relative: 1 %
HCT: 24 % — ABNORMAL LOW (ref 39.0–52.0)
Hemoglobin: 7.9 g/dL — ABNORMAL LOW (ref 13.0–17.0)
Immature Granulocytes: 1 %
Lymphocytes Relative: 8 %
Lymphs Abs: 1 10*3/uL (ref 0.7–4.0)
MCH: 28.7 pg (ref 26.0–34.0)
MCHC: 32.9 g/dL (ref 30.0–36.0)
MCV: 87.3 fL (ref 80.0–100.0)
Monocytes Absolute: 0.8 10*3/uL (ref 0.1–1.0)
Monocytes Relative: 6 %
Neutro Abs: 10.5 10*3/uL — ABNORMAL HIGH (ref 1.7–7.7)
Neutrophils Relative %: 84 %
Platelets: 433 10*3/uL — ABNORMAL HIGH (ref 150–400)
RBC: 2.75 MIL/uL — ABNORMAL LOW (ref 4.22–5.81)
RDW: 16.7 % — ABNORMAL HIGH (ref 11.5–15.5)
WBC: 12.5 10*3/uL — ABNORMAL HIGH (ref 4.0–10.5)
nRBC: 0 % (ref 0.0–0.2)

## 2019-06-14 LAB — URINALYSIS, ROUTINE W REFLEX MICROSCOPIC
Bilirubin Urine: NEGATIVE
Glucose, UA: NEGATIVE mg/dL
Ketones, ur: NEGATIVE mg/dL
Nitrite: NEGATIVE
Protein, ur: 100 mg/dL — AB
Specific Gravity, Urine: 1.011 (ref 1.005–1.030)
WBC, UA: >50 WBC/hpf — ABNORMAL HIGH (ref 0–5)
pH: 9 — ABNORMAL HIGH (ref 5.0–8.0)

## 2019-06-14 LAB — RENAL FUNCTION PANEL
Albumin: 1.7 g/dL — ABNORMAL LOW (ref 3.5–5.0)
Anion gap: 8 (ref 5–15)
BUN: 10 mg/dL (ref 8–23)
CO2: 23 mmol/L (ref 22–32)
Calcium: 7.7 mg/dL — ABNORMAL LOW (ref 8.9–10.3)
Chloride: 101 mmol/L (ref 98–111)
Creatinine, Ser: 1.01 mg/dL (ref 0.61–1.24)
GFR calc Af Amer: >60 mL/min (ref >60)
GFR calc non Af Amer: >60 mL/min (ref >60)
Glucose, Bld: 121 mg/dL — ABNORMAL HIGH (ref 70–99)
Phosphorus: 1.9 mg/dL — ABNORMAL LOW (ref 2.5–4.6)
Potassium: 3.5 mmol/L (ref 3.5–5.1)
Sodium: 132 mmol/L — ABNORMAL LOW (ref 135–145)

## 2019-06-14 LAB — CULTURE, BLOOD (ROUTINE X 2): Special Requests: ADEQUATE

## 2019-06-14 LAB — MAGNESIUM: Magnesium: 1.7 mg/dL (ref 1.7–2.4)

## 2019-06-14 MED ORDER — AMLODIPINE BESYLATE 5 MG PO TABS
5.0000 mg | ORAL_TABLET | Freq: Once | ORAL | Status: AC
  Administered 2019-06-14: 5 mg via ORAL
  Filled 2019-06-14: qty 1

## 2019-06-14 MED ORDER — MAGNESIUM SULFATE 4 GM/100ML IV SOLN
4.0000 g | Freq: Once | INTRAVENOUS | Status: AC
  Administered 2019-06-14: 4 g via INTRAVENOUS
  Filled 2019-06-14: qty 100

## 2019-06-14 MED ORDER — POTASSIUM CHLORIDE CRYS ER 20 MEQ PO TBCR
40.0000 meq | EXTENDED_RELEASE_TABLET | Freq: Once | ORAL | Status: AC
  Administered 2019-06-14: 40 meq via ORAL
  Filled 2019-06-14: qty 2

## 2019-06-14 MED ORDER — AMLODIPINE BESYLATE 10 MG PO TABS
10.0000 mg | ORAL_TABLET | Freq: Every day | ORAL | Status: DC
  Administered 2019-06-15: 10 mg via ORAL
  Filled 2019-06-14: qty 1

## 2019-06-14 NOTE — Progress Notes (Signed)
PROGRESS NOTE    Henry Carter  IRW:[id number] DOB: 07-25-1940 DOA: 06/09/2019 PCP: [name], MD    Brief Narrative:  HPI per Dr. Sherron Monday [facility] a 79 y.o.malewith medical history significant ofiron deficiency anemia, BPH, COPD, hypertension, GERD, hyperlipidemia, PVD, history of DVT on anticoagulation, has a chronic indwelling foley catheter for BPHpresenting to the ED for evaluation of altered mental status and decreased p.o. intake. Patient's wife reported to ED provider that he has not been eating/drinking or taking his medications for the last 9 days. He fell 2 days ago while getting out of bed and hit his head on the carpet. No loss of consciousness reported. He has been confused. He has had bleeding from his Foley for the last 7 to 8 days.Patient is confused and not sure why he is here. His only complaint is suprapubic pain. Denies chest pain, shortness of breath, nausea, vomiting, or diarrhea. No additional history could be obtained from him.  ED Course:Afebrile. Slightly tachycardic. Labs showing no leukocytosis. Lactic acid mildly elevated at 2.2. Hemoglobin 6.4, was in the 10-11 range on recent labs. FOBT positive. No grossly bloody or melanotic stool noted on rectal exam. Sodium 127, potassium 6.2, bicarb 11, anion gap 19, glucose 129. BUN 140, creatinine 10.7. Baseline creatinine 0.9. LFTs normal. Blood culture x2 pending. High-sensitivity troponin negative. CK normal. SARS-CoV-2 rapid antigen test negative, PCR test pending. UA uninterpretable due to gross hematuria. Microscopic examination showing greater than 50 RBCs, 0-5 WBCs, and many bacteria. Urine culture pending. Chest x-ray showing emphysema and no active cardiopulmonary process. Head CT negative for acute intracranial abnormality. Showing stable appearance of the lateral ventricles out of proportion to the degree of cerebral atrophy, compatible with normal pressure  hydrocephalus.  Patient received IV Protonix 40 mg and 1 L normal saline bolus. 1 unit PRBCs ordered.  Assessment & Plan:   Principal Problem:   Hematuria Active Problems:   Acute renal failure (ARF) (HCC)   Acute blood loss anemia   Hyperkalemia   Metabolic acidosis   Acute bilateral obstructive uropathy   Hypokalemia   Acute metabolic encephalopathy   Hyponatremia  1 gross hematuria Felt secondary to obstructed Foley catheter in the setting of chronic Foley catheter.  CT abdomen and pelvis done on admission bilateral obstructive uropathy left greater than right secondary to bladder distention and chronic bladder outlet obstruction, no evidence of urinary tract calculi, enlarged prostate, distended bladder despite Foley catheter, 4 cm infrarenal AAA.  Clinical improvement.  Foley bag with almost clear urine.  Patient seen in consultation by urology and Foley catheter exchanged and currently working.  Urinalysis done with urine cultures were positive for Corynebacterium diphtheria.  Dr. Doristine Bosworth discussed with ID, Dr. Baxter Flattery was felt likely secondary to a skin contamination as patient improved off of antibiotics.  Patient with a T-max of 100.4 however asymptomatic.  Repeat blood cultures x2.  Repeat UA with cultures and sensitivities.  Will likely be discharged with Foley catheter in place.  Outpatient follow-up with urology.  2.  Acute renal failure secondary to obstructive uropathy Patient had presented with acute renal failure with creatinine going up as high as 10.79 with a BUN of 140 and a bicarb of 11 with a potassium of 6.2.  CT abdomen and pelvis which was done with bilateral obstructive uropathy.  Foley catheter was replaced with good urine output.  Patient is - 6.269 L during this hospitalization.  Nephrology was consulted and felt patient's acute renal failure secondary to a prerenal  azotemia secondary to significant dehydration in setting of bilateral hydronephrosis.  Patient was  placed on IV fluids with bicarb drip which has subsequently been discontinued.  Patient improving clinically.  Creatinine currently at 1.01.  Outpatient follow-up with urology.  3.  Hypokalemia K. Dur 40 mEq p.o. x1.   4.  Hyperkalemia Secondary to problem #2.  Status post insulin/D50, Kayexalate and Lokelma.  Resolved.  Follow.  5.  Acute metabolic encephalopathy Likely secondary to problem #2.  Improved with improvement with renal function.  Likely close to baseline.  6.  Severe metabolic acidosis Secondary to acute renal failure.  Patient did receive a bicarb drip.  Resolved.  7.  Acute blood loss anemia Secondary to gross hematuria.  Hemoglobin was 6.4 on admission.  Status post 2 units packed red blood cells during this hospitalization.  Hemoglobin currently at 7.9.  Follow.  8.  Hyponatremia Improving with hydration.  Follow.   9.  Slightly diagnosed DVT 05/11/2019 Xarelto was held secondary to gross hematuria leading to acute blood loss anemia.  Patient was seen in consultation by vascular surgery and underwent IVC filter placement on 08/18/6823 without complications.  Will defer resumption of anticoagulation to urology.  Continue to hold Xarelto for now.  10.  History of normal pressure hydrocephalus Head CT with stable appearance of lateral ventricles out of proportion to degree of cerebral atrophy, compatible with normal pressure hydrocephalus with no acute issues.  Outpatient follow-up.  11.  Hypertension Patient noted not to be on any antihypertensive medications at home.  Blood pressure improving.  Increase Norvasc to 10 mg daily.  Follow.   12.  AAA CT abdomen and pelvis did show a 4 cm infrarenal AAA.  Follow-up ultrasound recommended in 1 year.  13.  1/4+ blood cultures/low-grade temperature. Likely a contaminant.  No need for antibiotics at this time.  Patient with low-grade temp of 100.4.  Repeat blood cultures x2.  Repeat UA with cultures and sensitivities.   Follow.     DVT prophylaxis: SCDs Code Status: Full Family Communication: Updated patient.  No family at bedside. Disposition Plan:  . Patient came from: Home            . Anticipated d/c place: CIR versus SNF pending insurance authorization. . Barriers to d/c OR conditions which need to be met to effect a safe d/c: CIR pending insurance authorization versus SNF.   Consultants:   Vascular surgery: Dr. Donzetta Matters 06/10/2019  Urology: Dr. Junious Silk 06/09/2019  Nephrology: Dr. Jonnie Finner 06/09/2019  Procedures:   CT abdomen and pelvis 06/09/2019  CT head 06/09/2019  Chest x-ray 06/09/2019  Ultrasound-guided cannulation right common femoral vein/IVC venogram/placement of Cook Celect filter per Dr. Donzetta Matters 06/11/2019  Transfusion 2 units packed red blood cells 06/09/2019, 06/10/2019  Antimicrobials:  None   Subjective: Patient sitting up in bed about to eat his breakfast.  Denies any chest pain or shortness of breath.  Denies any further hematuria.  Feeling better.  T-max of 100.4.   Objective: Vitals:   06/13/19 1706 06/13/19 1946 06/14/19 0448 06/14/19 0845  BP: (!) 151/78 (!) 156/88 (!) 179/94 (!) 155/91  Pulse: 88 97 92 86  Resp: '19  20 20  ' Temp: 99.5 F (37.5 C)  98.3 F (36.8 C) (!) 100.4 F (38 C)  TempSrc:    Oral  SpO2: 99% 97% 100% 98%  Weight:      Height:        Intake/Output Summary (Last 24 hours) at 06/14/2019 1000 Last data filed  at 06/14/2019 0800 Gross per 24 hour  Intake 847.49 ml  Output 2775 ml  Net -1927.51 ml   Filed Weights   06/09/19 1412  Weight: 68 kg    Examination:  General exam: NAD. Respiratory system: CTAB.  No wheezes, no crackles, no rhonchi.  Normal respiratory effort.   Cardiovascular system: RRR 3/6 SEM.  No lower extremity edema.  No JVD.  Gastrointestinal system: Abdomen is soft, nontender, nondistended, positive bowel sounds.  No rebound.  No guarding.  Central nervous system: Alert and oriented. No focal neurological  deficits. Extremities: Symmetric 5 x 5 power. Skin: No rashes, lesions or ulcers.  Right groin with dressing intact with no signs of infection.  Nontender to palpation in the right groin. Psychiatry: Judgement and insight appear normal. Mood & affect appropriate.     Data Reviewed: I have personally reviewed following labs and imaging studies  CBC: Recent Labs  Lab 06/09/19 1425 06/09/19 2137 06/11/19 0714 06/11/19 1831 06/12/19 0544 06/13/19 0751 06/14/19 0145  WBC 8.7   < > 8.0 9.4 9.5 11.8* 12.5*  NEUTROABS 7.7  --   --   --   --   --  10.5*  HGB 6.4*   < > 8.3* 8.4* 7.7* 8.2* 7.9*  HCT 19.5*   < > 24.7* 25.5* 23.8* 25.0* 24.0*  MCV 88.6   < > 84.6 88.2 89.5 88.7 87.3  PLT 343   < > 344 362 368 418* 433*   < > = values in this interval not displayed.   Basic Metabolic Panel: Recent Labs  Lab 06/10/19 1549 06/11/19 0714 06/12/19 0544 06/13/19 0751 06/14/19 0145  NA 139 142 134* 130* 132*  K 2.9* 3.3* 3.6 3.1* 3.5  CL 105 104 100 95* 101  CO2 '22 26 26 26 23  ' GLUCOSE 127* 92 93 89 121*  BUN 62* 32* '18 12 10  ' CREATININE 3.21* 1.59* 1.14 1.07 1.01  CALCIUM 8.0* 8.0* 7.9* 7.9* 7.7*  MG  --   --   --   --  1.7  PHOS  --   --   --   --  1.9*   GFR: Estimated Creatinine Clearance: 58.9 mL/min (by C-G formula based on SCr of 1.01 mg/dL). Liver Function Tests: Recent Labs  Lab 06/09/19 1425 06/14/19 0145  AST 23  --   ALT 25  --   ALKPHOS 66  --   BILITOT 1.0  --   PROT 5.6*  --   ALBUMIN 2.4* 1.7*   No results for input(s): LIPASE, AMYLASE in the last 168 hours. Recent Labs  Lab 06/09/19 2137  AMMONIA 53*   Coagulation Profile: Recent Labs  Lab 06/09/19 1425  INR 2.6*   Cardiac Enzymes: Recent Labs  Lab 06/09/19 1425  CKTOTAL 81   BNP (last 3 results) No results for input(s): PROBNP in the last 8760 hours. HbA1C: No results for input(s): HGBA1C in the last 72 hours. CBG: Recent Labs  Lab 06/09/19 2229  GLUCAP 108*   Lipid Profile: No  results for input(s): CHOL, HDL, LDLCALC, TRIG, CHOLHDL, LDLDIRECT in the last 72 hours. Thyroid Function Tests: No results for input(s): TSH, T4TOTAL, FREET4, T3FREE, THYROIDAB in the last 72 hours. Anemia Panel: No results for input(s): VITAMINB12, FOLATE, FERRITIN, TIBC, IRON, RETICCTPCT in the last 72 hours. Sepsis Labs: Recent Labs  Lab 06/09/19 1425 06/09/19 2137  LATICACIDVEN 2.2* 1.3    Recent Results (from the past 240 hour(s))  Culture, blood (Routine x 2)  Status: Abnormal   Collection Time: 06/09/19  2:25 PM   Specimen: BLOOD  Result Value Ref Range Status   Specimen Description BLOOD RIGHT ANTECUBITAL  Final   Special Requests   Final    BOTTLES DRAWN AEROBIC AND ANAEROBIC Blood Culture adequate volume   Culture  Setup Time   Final    GRAM POSITIVE RODS AEROBIC BOTTLE ONLY CRITICAL RESULT CALLED TO, READ BACK BY AND VERIFIED WITH: PHARMD J LADFORD [id number] AT 724 AM BY CM    Culture (A)  Final    DIPHTHEROIDS(CORYNEBACTERIUM SPECIES) Standardized susceptibility testing for this organism is not available. Performed at [facility], [place] [address], [place], [place] [address]    Report Status 06/14/2019 FINAL  Final  Culture, blood (Routine x 2)     Status: Abnormal   Collection Time: 06/09/19  2:44 PM   Specimen: BLOOD  Result Value Ref Range Status   Specimen Description BLOOD LEFT ANTECUBITAL  Final   Special Requests   Final    BOTTLES DRAWN AEROBIC AND ANAEROBIC Blood Culture adequate volume   Culture  Setup Time   Final    GRAM POSITIVE COCCI IN CLUSTERS AEROBIC BOTTLE ONLY CRITICAL RESULT CALLED TO, READ BACK BY AND VERIFIED WITH: [place] [id number] AT 37 BY CM    Culture (A)  Final    STAPHYLOCOCCUS SPECIES (COAGULASE NEGATIVE) THE SIGNIFICANCE OF ISOLATING THIS ORGANISM FROM A SINGLE SET OF BLOOD CULTURES WHEN MULTIPLE SETS ARE DRAWN IS UNCERTAIN. PLEASE NOTIFY THE MICROBIOLOGY DEPARTMENT WITHIN ONE WEEK IF SPECIATION AND SENSITIVITIES  ARE REQUIRED. Performed at [facility], [place] [address], [place], [place] [address]    Report Status 06/11/2019 FINAL  Final  SARS CORONAVIRUS 2 (TAT 6-24 HRS) Nasopharyngeal Nasopharyngeal Swab     Status: None   Collection Time: 06/09/19  7:39 PM   Specimen: Nasopharyngeal Swab  Result Value Ref Range Status   SARS Coronavirus 2 NEGATIVE NEGATIVE Final    Comment: (NOTE) SARS-CoV-2 target nucleic acids are NOT DETECTED. The SARS-CoV-2 RNA is generally detectable in upper and lower respiratory specimens during the acute phase of infection. Negative results do not preclude SARS-CoV-2 infection, do not rule out co-infections with other pathogens, and should not be used as the sole basis for treatment or other patient management decisions. Negative results must be combined with clinical observations, patient history, and epidemiological information. The expected result is Negative. Fact Sheet for Patients: SugarRoll.be Fact Sheet for Healthcare Providers: https://www.woods-mathews.com/ This test is not yet approved or cleared by the [place] FDA and  has been authorized for detection and/or diagnosis of SARS-CoV-2 by FDA under an Emergency Use Authorization (EUA). This EUA will remain  in effect (meaning this test can be used) for the duration of the COVID-19 declaration under Section 56 4(b)(1) of the Act, 21 U.S.C. section 360bbb-3(b)(1), unless the authorization is terminated or revoked sooner. Performed at [facility], [place] [address] [place], [place] [address]   Urine culture     Status: Abnormal   Collection Time: 06/09/19  9:59 PM   Specimen: Urine, Random  Result Value Ref Range Status   Specimen Description URINE, RANDOM  Final   Special Requests NONE  Final   Culture (A)  Final    >=100,000 COLONIES/mL DIPHTHEROIDS(CORYNEBACTERIUM SPECIES) Standardized susceptibility testing for this organism is not  available. Performed at [facility], [place] [address], [place],  [address]    Report Status 06/11/2019 FINAL  Final         Radiology Studies: No results found.      Scheduled Meds: . amLODipine  5 mg Oral Daily  . Chlorhexidine Gluconate Cloth  6 each Topical Daily  . influenza vaccine adjuvanted  0.5 mL Intramuscular Tomorrow-1000  . pantoprazole  40 mg Oral BID  . potassium chloride  40 mEq Oral Once   Continuous Infusions: . sodium chloride 75 mL/hr at 06/13/19 1900  . magnesium sulfate bolus IVPB       LOS: 5 days    Time spent: 40 minutes    Irine Seal, MD [facility]   To contact the attending provider between 7A-7P or the covering provider during after hours 7P-7A, please log into the web site [email or web address] and access using universal [place] password for that web site. If you do not have the password, please call the hospital operator.  06/14/2019, 10:00 AM

## 2019-06-14 NOTE — TOC Initial Note (Signed)
Transition of Care [facility]) - Initial/Assessment Note    Patient Details  Name: Henry Carter MRN: [id number] Date of Birth: January 22, 1941  [facility] of [facility] CM/SW Contact:    Maryclare Labrador, RN Phone Number: 06/14/2019, 2:21 PM  Clinical Narrative:   CM informed that CIR will likely be denied, and now therapy recommending SNF per assessment today.  CM had lengthy discussion with pt and his wife (wife at bedside).  CM explained that barrier with CIR and that SNF for short term was now recommended.  Pt immediately declined SNF and was adamant about going home at discharge.  CM explained the recommendation - even read aloud the therapy assessment however pt continued to decline SNF.  CM informed wife and pt that [facility] could be arranged however each home health resource would only come to the home once or twice a week for approximately 45 mins each visit .  Family confirms they will be available 24/7 a day to tend to any of pt's needs at discharge.  Both wife and pt in agreement with HH.  CM provided medicare.gov HH list choice - [facility] chosen - [name] accepts referral.  CM informed attending and therapy of pt's decision to discharge home - CM request therapy assess pt for home equipment needs               Expected Discharge Plan: [place] Barriers to Discharge: Barriers Resolved   Patient Goals and CMS Choice Patient states their goals for this hospitalization and ongoing recovery are:: Pt states he is ready to get home right now - did not specify goals CMS Medicare.gov Compare Post Acute Care list provided to:: Patient Choice offered to / list presented to : Patient, Spouse  Expected Discharge Plan and Services Expected Discharge Plan: [place]       Living arrangements for the past 2 months: Single Family Home                           HH Arranged: RN, PT, OT, Nurse's Aide, Refused SNF, Social Work [employer] Agency: [place] Date  [facility] Agency Contacted: 06/14/19 Time HH Agency Contacted: 29 Representative spoke with at [place]: [place] Arrangements/Services Living arrangements for the past 2 months: [place] Lives with:: Spouse Patient language and need for interpreter reviewed:: Yes Do you feel safe going back to the place where you live?: Yes      Need for Family Participation in Patient Care: Yes (Comment) Care giver support system in place?: Yes (comment) Current home services: DME(has cane and walker without wheels) Criminal Activity/Legal Involvement Pertinent to Current Situation/Hospitalization: No - Comment as needed  Activities of Daily Living Home Assistive Devices/Equipment: None ADL Screening (condition at time of admission) Patient's cognitive ability adequate to safely complete daily activities?: Yes Is the patient deaf or have difficulty hearing?: No Does the patient have difficulty seeing, even when wearing glasses/contacts?: No Does the patient have difficulty concentrating, remembering, or making decisions?: No Patient able to express need for assistance with ADLs?: Yes Does the patient have difficulty dressing or bathing?: No Independently performs ADLs?: Yes (appropriate for developmental age) Does the patient have difficulty walking or climbing stairs?: No Weakness of Legs: None Weakness of Arms/Hands: None  Permission Sought/Granted   Permission granted to share information with : Yes, Verbal Permission Granted  Emotional Assessment   Attitude/Demeanor/Rapport: Guarded Affect (typically observed): Adaptable, Apprehensive Orientation: : Oriented to Self, Oriented to Place, Oriented to  Time, Oriented to Situation   Psych Involvement: No (comment)  Admission diagnosis:  Gross hematuria [R31.0] AKI (acute kidney injury) ([place]) [N17.9] Hematuria [R31.9] Acute renal failure, unspecified acute renal failure type ([place]) [N17.9] Anemia, unspecified  type [D64.9] Patient Active Problem List   Diagnosis Date Noted  . Acute bilateral obstructive uropathy   . Hypokalemia   . Acute metabolic encephalopathy   . Hyponatremia   . Hematuria 06/09/2019  . Acute renal failure (ARF) ([place]) 06/09/2019  . Acute blood loss anemia 06/09/2019  . Hyperkalemia 06/09/2019  . Metabolic acidosis 99991111  . Left leg DVT ([place]) 05/18/2019  . Left leg swelling 05/11/2019  . Pre-operative cardiovascular examination 03/22/2019  . 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 Right arm weakness 07/21/2017  . Non compliance w medication regimen 07/30/2016  . 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[place]) 12/11/2015  . Mild cognitive impairment 12/11/2015  . 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 Healthcare maintenance 02/07/2013  . Iron deficiency anemia due to chronic blood loss 01/18/2013  . C O P D 04/08/2010  . Atherosclerosis of native artery of extremity with intermittent claudication ([place]) 11/26/2008  . Benign prostatic hyperplasia 10/20/2007  . PERIPHERAL VASCULAR DISEASE 05/19/2007  . Essential hypertension 02/01/2007  . HYPERLIPIDEMIA 01/03/2007   PCP:  [name], MD Pharmacy:   [id number] - [place], [place] [address] Phone: [phone] Fax: [phone]     Social Determinants of Health (SDOH) Interventions    Readmission Risk Interventions No flowsheet data found.

## 2019-06-14 NOTE — Progress Notes (Signed)
Inpatient Rehab Admissions Coordinator:   Received notification of denial from pt's insurance.  Note therapy recommendations now for SNF.  Will sign off at this time.   Shann Medal, PT, DPT Admissions Coordinator [phone] 06/14/19  3:28 PM

## 2019-06-14 NOTE — Plan of Care (Signed)

## 2019-06-14 NOTE — Progress Notes (Signed)
Physical Therapy Treatment Patient Details Name: Henry Carter MRN: [id number] DOB: [birth year] Today's Date: 06/14/2019    History of Present Illness Pt is a 79 y.o. male admitted 06/09/19 with AMS and decreased p.o. intake, wife reports pt also fell out of bed hitting head, no LOC. Head CT negative for acute intracranial abnormality. Worked up for [employer], acute renal failure, anemia, severe metabolic acidosis. s/p IVC filter placement via R groin on 3/1 for DVT. Found to have bladder distention. PMH includes COPD, HTN, PVD, DVT on anticoagulation, BPH with chronic indwelling catheter.    PT Comments    Pt required max encouragement to participate in EOB activity only this session, as pt refused all out of bed at this time due to drowsiness. Pt is minimally engaging in PT at this point, PT does not feel pt will tolerate CIR at this time. PT updated recommendation to reflect SNF level of care, will continue to follow acutely.    Follow Up Recommendations  Supervision/Assistance - 24 hour;SNF     Equipment Recommendations  Other (comment)(defer .)    Recommendations for Other Services       Precautions / Restrictions Precautions Precautions: Fall Precaution Comments: Chronic indwelling folley catheter Restrictions Weight Bearing Restrictions: No    Mobility  Bed Mobility Overal bed mobility: Needs Assistance Bed Mobility: Supine to Sit     Supine to sit: Min assist Sit to supine: Min assist   General bed mobility comments: min assist for supine<>sit for trunk and LE management, especially lifting LEs back into bed. Very increased time to perform, pt stalling after moving LEs to EOB stating "I need something to drink"  Transfers                 General transfer comment: pt refuses  Ambulation/Gait             General Gait Details: pt refuses   Stairs             Wheelchair Mobility    Modified Rankin (Stroke Patients Only)       Balance  Overall balance assessment: Needs assistance Sitting-balance support: Feet supported;Single extremity supported Sitting balance-Leahy Scale: Fair Sitting balance - Comments: Sat EOB x7 minutes drinking drinks, conversing with PT. Pt not agreeable to EOB exercise                                    Cognition Arousal/Alertness: Awake/alert Behavior During Therapy: WFL for tasks assessed/performed Overall Cognitive Status: Impaired/Different from baseline Area of Impairment: Memory;Following commands;Safety/judgement;Problem solving                     Memory: Decreased short-term memory Following Commands: Follows one step commands with increased time;Follows one step commands consistently Safety/Judgement: Decreased awareness of safety;Decreased awareness of deficits Awareness: Intellectual Problem Solving: Slow processing;Decreased initiation;Difficulty sequencing;Requires verbal cues General Comments: Very poor understanding of deficits, benefits of mobility.      Exercises General Exercises - Lower Extremity Short Arc Quad: AROM;Both;10 reps;Supine    General Comments General comments (skin integrity, edema, etc.): vss      Pertinent Vitals/Pain Pain Assessment: Faces Faces Pain Scale: Hurts a little bit Pain Location: generalized Pain Descriptors / Indicators: Guarding;Grimacing Pain Intervention(s): Limited activity within patient's tolerance;Monitored during session;Repositioned    Home Living  Prior Function            PT Goals (current goals can now be found in the care plan section) Acute Rehab PT Goals Patient Stated Goal: rest PT Goal Formulation: With patient Time For Goal Achievement: 06/24/19 Progress towards PT goals: Not progressing toward goals - comment(limited engagement and participation in PT)    Frequency    Min 2X/week      PT Plan Discharge plan needs to be updated;Frequency needs to be  updated    Co-evaluation              AM-PAC PT "6 Clicks" Mobility   Outcome Measure  Help needed turning from your back to your side while in a flat bed without using bedrails?: A Little Help needed moving from lying on your back to sitting on the side of a flat bed without using bedrails?: A Little Help needed moving to and from a bed to a chair (including a wheelchair)?: Total Help needed standing up from a chair using your arms (e.g., wheelchair or bedside chair)?: Total Help needed to walk in hospital room?: Total Help needed climbing 3-5 steps with a railing? : Total 6 Click Score: 10    End of Session   Activity Tolerance: Patient limited by fatigue Patient left: in bed;with call bell/phone within reach;with bed alarm set Nurse Communication: Mobility status PT Visit Diagnosis: Other abnormalities of gait and mobility (R26.89);Muscle weakness (generalized) (M62.81)     Time: [id number] PT Time Calculation (min) (ACUTE ONLY): 25 min  Charges:  $Therapeutic Activity: 8-22 mins                     Sanav Remer E, PT Acute Rehabilitation Services Pager [phone]  Office [phone]    Lacy Sofia D Zonnie Landen 06/14/2019, 1:15 PM

## 2019-06-15 LAB — CBC WITH DIFFERENTIAL/PLATELET
Abs Immature Granulocytes: 0.1 10*3/uL — ABNORMAL HIGH (ref 0.00–0.07)
Basophils Absolute: 0 10*3/uL (ref 0.0–0.1)
Basophils Relative: 0 %
Eosinophils Absolute: 0.3 10*3/uL (ref 0.0–0.5)
Eosinophils Relative: 2 %
HCT: 26.1 % — ABNORMAL LOW (ref 39.0–52.0)
Hemoglobin: 8.6 g/dL — ABNORMAL LOW (ref 13.0–17.0)
Immature Granulocytes: 1 %
Lymphocytes Relative: 7 %
Lymphs Abs: 0.9 10*3/uL (ref 0.7–4.0)
MCH: 28.7 pg (ref 26.0–34.0)
MCHC: 33 g/dL (ref 30.0–36.0)
MCV: 87 fL (ref 80.0–100.0)
Monocytes Absolute: 0.7 10*3/uL (ref 0.1–1.0)
Monocytes Relative: 6 %
Neutro Abs: 10.3 10*3/uL — ABNORMAL HIGH (ref 1.7–7.7)
Neutrophils Relative %: 84 %
Platelets: 444 10*3/uL — ABNORMAL HIGH (ref 150–400)
RBC: 3 MIL/uL — ABNORMAL LOW (ref 4.22–5.81)
RDW: 16.2 % — ABNORMAL HIGH (ref 11.5–15.5)
WBC: 12.3 10*3/uL — ABNORMAL HIGH (ref 4.0–10.5)
nRBC: 0 % (ref 0.0–0.2)

## 2019-06-15 LAB — BASIC METABOLIC PANEL
Anion gap: 8 (ref 5–15)
BUN: 12 mg/dL (ref 8–23)
CO2: 22 mmol/L (ref 22–32)
Calcium: 7.8 mg/dL — ABNORMAL LOW (ref 8.9–10.3)
Chloride: 103 mmol/L (ref 98–111)
Creatinine, Ser: 1.02 mg/dL (ref 0.61–1.24)
GFR calc Af Amer: >60 mL/min (ref >60)
GFR calc non Af Amer: >60 mL/min (ref >60)
Glucose, Bld: 99 mg/dL (ref 70–99)
Potassium: 4.2 mmol/L (ref 3.5–5.1)
Sodium: 133 mmol/L — ABNORMAL LOW (ref 135–145)

## 2019-06-15 LAB — URINE CULTURE: Culture: NO GROWTH

## 2019-06-15 MED ORDER — PANTOPRAZOLE SODIUM 40 MG PO TBEC: 40.0000 mg | DELAYED_RELEASE_TABLET | Freq: Two times a day (BID) | ORAL | 1 refills | Status: DC

## 2019-06-15 MED ORDER — AMLODIPINE BESYLATE 10 MG PO TABS: 10.0000 mg | ORAL_TABLET | Freq: Every day | ORAL | 1 refills | Status: DC

## 2019-06-15 NOTE — Discharge Summary (Signed)
Physician Discharge Summary  Henry Carter [id number] DOB: 06-26-1940 DOA: 06/09/2019  PCP: [name], MD  Admit date: 06/09/2019 Discharge date: 06/15/2019  Time spent: 60 minutes  Recommendations for Outpatient Follow-up:  1. Follow-up with [name], MD in 1 week.  On follow-up patient will need a basic metabolic profile done to follow-up on electrolytes and renal function.  CBC will need to be done to follow-up on H&H.  Urine cultures and blood cultures which were done and pending on day of discharge will need to be followed up upon.  Patient will need to be reassessed to see whether it is okay to resume his anticoagulation with Xarelto. 2. Follow-up with Dr. Alyson Ingles, urology in 1 week for follow-up on gross hematuria, chronic indwelling Foley catheter, will also need to be determined per urology as to when patient may be able to resume back on anticoagulation. 3. Follow-up with Dr. Donzetta Matters, vascular surgery in 3 months for follow-up on IVC filter/AAA.   Discharge Diagnoses:  Principal Problem:   Hematuria Active Problems:   Acute renal failure (ARF) (HCC)   Acute blood loss anemia   Hyperkalemia   Metabolic acidosis   Acute bilateral obstructive uropathy   Hypokalemia   Acute metabolic encephalopathy   Hyponatremia   Discharge Condition: Stable and improved  Diet recommendation: Heart healthy  Filed Weights   06/09/19 1412  Weight: 68 kg    History of present illness:  HPI per Dr. Sherron Monday Hauptmann is a 79 y.o. male with medical history significant of iron deficiency anemia, BPH, COPD, hypertension, GERD, hyperlipidemia, PVD, history of DVT on anticoagulation, has a chronic indwelling foley catheter for BPH presented to the ED for evaluation of altered mental status and decreased p.o. intake.  Patient's wife reported to ED provider that he had not been eating/drinking or taking his medications for the last 9 days.  He fell 2 days ago while  getting out of bed and hit his head on the carpet.  No loss of consciousness reported.  He has been confused.  He  had bleeding from his Foley for the last 7 to 8 days.  Patient was confused and not sure why he was here.  His only complaint was suprapubic pain.  Denied chest pain, shortness of breath, nausea, vomiting, or diarrhea.  No additional history could be obtained from him.  ED Course: Afebrile.  Slightly tachycardic.  Labs showing no leukocytosis.  Lactic acid mildly elevated at 2.2.  Hemoglobin 6.4, was in the 10-11 range on recent labs.  FOBT positive.  No grossly bloody or melanotic stool noted on rectal exam.  Sodium 127, potassium 6.2, bicarb 11, anion gap 19, glucose 129.  BUN 140, creatinine 10.7.  Baseline creatinine 0.9.  LFTs normal.  Blood culture x2 pending.  High-sensitivity troponin negative.  CK normal.  SARS-CoV-2 rapid antigen test negative, PCR test pending.  UA uninterpretable due to gross hematuria. Microscopic examination showing greater than 50 RBCs, 0-5 WBCs, and many bacteria.  Urine culture pending. Chest x-ray showing emphysema and no active cardiopulmonary process. Head CT negative for acute intracranial abnormality.  Showing stable appearance of the lateral ventricles out of proportion to the degree of cerebral atrophy, compatible with normal pressure hydrocephalus.  Patient received IV Protonix 40 mg and 1 L normal saline bolus.  1 unit PRBCs ordered.  Hospital Course:  1 gross hematuria Felt secondary to obstructed Foley catheter in the setting of chronic Foley catheter.  CT abdomen and pelvis done on  admission bilateral obstructive uropathy left greater than right secondary to bladder distention and chronic bladder outlet obstruction, no evidence of urinary tract calculi, enlarged prostate, distended bladder despite Foley catheter, 4 cm infrarenal AAA.  Hematuria cleared and had resolved by day of discharge.  Foley catheter bag with clear urine noted. Patient seen  in consultation by urology and Foley catheter exchanged and currently working.  Urinalysis done with urine cultures were positive for Corynebacterium diphtheria.  Dr. Doristine Bosworth discussed with ID, Dr. Baxter Flattery was felt likely secondary to a skin contamination as patient improved off of antibiotics.  Patient with a T-max of 100.4 on 06/13/2019 and remained asymptomatic.  Patient has remained afebrile for the past 24 to 48 hours.  Repeat blood cultures which were done and repeat urine cultures pending with no growth to date.  Patient will be discharged home with Foley catheter in place with close outpatient follow-up with urology.  2.  Acute renal failure secondary to obstructive uropathy Patient had presented with acute renal failure with creatinine going up as high as 10.79 with a BUN of 140 and a bicarb of 11 with a potassium of 6.2.  CT abdomen and pelvis which was done with bilateral obstructive uropathy.  Foley catheter was replaced with good urine output.  Patient is - 8.254 L during this hospitalization.  Nephrology was consulted and felt patient's acute renal failure secondary to a prerenal azotemia secondary to significant dehydration in setting of bilateral hydronephrosis.  Patient was placed on IV fluids with bicarb drip which has subsequently been discontinued.  Patient improved clinically and acute renal failure had resolved by day of discharge.  On day of discharge creatinine was down to 1.02.  Outpatient follow-up with urology.   3.  Hypokalemia Repleted.    4.  Hyperkalemia Secondary to problem #2.  Status post insulin/D50, Kayexalate and Lokelma.  Resolved by day of discharge.  Outpatient follow-up.  5.  Acute metabolic encephalopathy Likely secondary to problem #2.  Improved with improvement with renal function.  Likely close to baseline.  6.  Severe metabolic acidosis Secondary to acute renal failure.  Patient did receive a bicarb drip.  Resolved.  7.  Acute blood loss  anemia Secondary to gross hematuria.  Hemoglobin was 6.4 on admission.  Status post 2 units packed red blood cells during this hospitalization.  Hemoglobin stabilized at 8.6 by day of discharge.  Outpatient follow-up.   8.  Hyponatremia Improved with hydration.  9.    Recently diagnosed DVT 05/11/2019 Xarelto was held secondary to gross hematuria leading to acute blood loss anemia.  Patient was seen in consultation by vascular surgery and underwent IVC filter placement on [id number] without complications.  Patient be discharged home off Xarelto and is to follow-up with urology and PCP in 1 week.  On follow-up urology to determine when okay for patient to be resumed on his anticoagulation.  Outpatient follow-up with PCP and vascular surgery.  10.  History of normal pressure hydrocephalus Head CT with stable appearance of lateral ventricles out of proportion to degree of cerebral atrophy, compatible with normal pressure hydrocephalus with no acute issues.  Outpatient follow-up.  11.  Hypertension Patient noted not to be on any antihypertensive medications at home.  Blood pressure improving.  Increase Norvasc to 10 mg daily.  Follow.   12.  AAA CT abdomen and pelvis did show a 4 cm infrarenal AAA.  Follow-up ultrasound recommended in 1 year.  Outpatient follow-up with vascular surgery.  13.  1/4+ blood  cultures/low-grade temperature. Likely a contaminant.  No need for antibiotics at this time.  Patient with low-grade temp of 100.4 on 06/13/2019 which has since resolved.  Blood cultures were repeated with no growth to date and urine cultures repeated with no growth to date.  Patient denied any dysuria by day of discharge.  Patient was discharged in stable and improved condition.  Outpatient follow-up with PCP.    Procedures:  CT abdomen and pelvis 06/09/2019  CT head 06/09/2019  Chest x-ray 06/09/2019  Ultrasound-guided cannulation right common femoral vein/IVC venogram/placement of Cook  Celect filter per Dr. Donzetta Matters 06/11/2019  Transfusion 2 units packed red blood cells 06/09/2019, 06/10/2019  Consultations:  Vascular surgery: Dr. Donzetta Matters 06/10/2019  Urology: Dr. Junious Silk 06/09/2019  Nephrology: Dr. Jonnie Finner 06/09/2019  Discharge Exam: Vitals:   06/15/19 0408 06/15/19 0821  BP: (!) 154/93 (!) 159/89  Pulse: 90 90  Resp: 20 17  Temp: 99.2 F (37.3 C) 99.5 F (37.5 C)  SpO2: 100% 100%    General: NAD Cardiovascular: RRR Respiratory: CTAB  Discharge Instructions   Discharge Instructions    Diet - low sodium heart healthy   Complete by: As directed    Increase activity slowly   Complete by: As directed      Allergies as of 06/15/2019      Reactions   Ace Inhibitors    REACTION: antioedema   Penicillins Hives   Has patient had a PCN reaction causing immediate rash, facial/tongue/throat swelling, SOB or lightheadedness with hypotension: No Has patient had a PCN reaction causing severe rash involving mucus membranes or skin necrosis: No Has patient had a PCN reaction that required hospitalization yes Has patient had a PCN reaction occurring within the last 10 years: no If all of the above answers are "NO", then may proceed with Cephalosporin use.      Medication List    STOP taking these medications   clopidogrel 75 MG tablet Commonly known as: PLAVIX   Rivaroxaban 15 & 20 MG Tbpk   rivaroxaban 20 MG Tabs tablet Commonly known as: Xarelto     TAKE these medications   amLODipine 10 MG tablet Commonly known as: NORVASC Take 1 tablet (10 mg total) by mouth daily. Start taking on: June 16, 2019   pantoprazole 40 MG tablet Commonly known as: PROTONIX Take 1 tablet (40 mg total) by mouth 2 (two) times daily.   simvastatin 20 MG tablet Commonly known as: ZOCOR Take 1 tablet (20 mg total) by mouth at bedtime.      Allergies  Allergen Reactions  . Ace Inhibitors     REACTION: antioedema  . Penicillins Hives    Has patient had a PCN reaction causing  immediate rash, facial/tongue/throat swelling, SOB or lightheadedness with hypotension: No Has patient had a PCN reaction causing severe rash involving mucus membranes or skin necrosis: No Has patient had a PCN reaction that required hospitalization yes Has patient had a PCN reaction occurring within the last 10 years: no If all of the above answers are "NO", then may proceed with Cephalosporin use.   Follow-up Information    [facility] Follow up.   Specialty: Home Health Services Why: Home Health Contact information: [place] [address] [phone]        [name], MD. Schedule an appointment as soon as possible for a visit in 1 week(s).   Specialty: Internal Medicine Contact information: [place] [address] [phone]  Waynetta Sandy, MD. Schedule an appointment as soon as possible for a visit in 3 month(s).   Specialties: Vascular Surgery, Cardiology Contact information: [address] [place] [address] [phone]        Cleon Gustin, MD. Schedule an appointment as soon as possible for a visit in 1 week(s).   Specialty: Urology Why: f/u on hematuria and when to restart anticoagulation back. Contact information: [place] [address] [phone]            The results of significant diagnostics from this hospitalization (including imaging, microbiology, ancillary and laboratory) are listed below for reference.    Significant Diagnostic Studies: CT ABDOMEN PELVIS WO CONTRAST  Result Date: 06/09/2019 CLINICAL DATA:  Gross hematuria EXAM: CT ABDOMEN AND PELVIS WITHOUT CONTRAST TECHNIQUE: Multidetector CT imaging of the abdomen and pelvis was performed following the standard protocol without IV contrast. COMPARISON:  None. FINDINGS: Lower chest: No acute pleural or parenchymal lung disease. Hepatobiliary: Gallbladder is decompressed which limits  its evaluation. Unenhanced imaging of the liver is unremarkable. Pancreas: Unremarkable. No pancreatic ductal dilatation or surrounding inflammatory changes. Spleen: Normal in size without focal abnormality. Adrenals/Urinary Tract: There is bilateral obstructive uropathy, left greater than right. I do not see an obstructing calculus. Distension of the ureters and renal collecting systems could be due to chronic bladder outlet obstruction given the degree of bladder distention on this exam. Diffuse mural calcifications are seen throughout the bladder. No definite bladder wall mass or filling defect. Foley catheter is seen within the bladder lumen. The adrenals are unremarkable. Stomach/Bowel: No bowel obstruction or ileus. Normal appendix right lower quadrant. No wall thickening or inflammatory changes. Vascular/Lymphatic: There is aneurysmal dilatation of the infrarenal abdominal aorta, measuring up to 4 cm in anterior-posterior dimension. Diffuse calcified atheromatous plaque throughout the aorta and its branches. No pathologic adenopathy within the abdomen or pelvis. Reproductive: Prostate is enlarged, measuring 5.4 x 5.4 cm in transverse direction. Other: There is trace free fluid in the pelvis, nonspecific. Perirectal fat stranding is noted, also nonspecific. Musculoskeletal: No acute or destructive bony lesions. Reconstructed images demonstrate no additional findings. IMPRESSION: 1. Bilateral obstructive uropathy, left greater than right, which may be due to bladder distension and chronic bladder outlet obstruction. No evidence of urinary tract calculi. 2. Distended bladder despite Foley catheter. Please correlate with catheter function. Diffuse mural calcifications of the bladder without filling defect or mass. 3. Enlarged prostate. 4. 4 cm infrarenal abdominal aortic aneurysm. Recommend followup by ultrasound in 1 year. This recommendation follows ACR consensus guidelines: White Paper of the ACR Incidental  Findings Committee II on Vascular Findings. J Am Coll Radiol 2013; 10:789-794. Aortic aneurysm NOS (ICD10-I71.9) 5. Trace pelvic free fluid. 6. Perirectal fat stranding of uncertain significance. Electronically Signed   By: Randa Ngo M.D.   On: 06/09/2019 21:29   CT Head Wo Contrast  Result Date: 06/09/2019 CLINICAL DATA:  Urinary tract infection, anorexia EXAM: CT HEAD WITHOUT CONTRAST TECHNIQUE: Contiguous axial images were obtained from the base of the skull through the vertex without intravenous contrast. COMPARISON:  12/30/2017 FINDINGS: Brain: Hypodensities throughout the periventricular white matter compatible with stable chronic small vessel ischemic changes. No signs of acute infarct or hemorrhage. Stable prominence of the lateral ventricles. Remaining midline structures are unremarkable. No acute extra-axial fluid collections. No mass effect. Vascular: No hyperdense vessel or unexpected calcification. Skull: Normal. Negative for fracture or focal lesion. Sinuses/Orbits: No acute finding. Other: None IMPRESSION: 1. Stable chronic small vessel  ischemic changes in the white matter. No acute intracranial process. 2. Stable prominence of the lateral ventricles out of proportion to the degree of cerebral atrophy, compatible with normal pressure hydrocephalus. Electronically Signed   By: Randa Ngo M.D.   On: 06/09/2019 19:29   PERIPHERAL VASCULAR CATHETERIZATION  Result Date: 06/11/2019 Patient name: Henry Carter MRN: [id number] DOB: 06/30/1940 Sex: male 06/11/2019 Pre-operative Diagnosis: dvt with contraindication to anticoagulation Post-operative diagnosis:  Same Surgeon:  Eda Paschal. Donzetta Matters, MD Procedure Performed: 1.  Ultrasound-guided cannulation right common femoral vein 2.  IVC venogram 3.  Placement of Cook Celect filter Indications: 79 year old male with extensive left lower extremity DVT.  He has hematuria now is indicated for IVC filter placement. Findings: IVC was patent less than 30 mm  in diameter.  Filter was placed in the infrarenal position.  Procedure:  The patient was identified in the holding area and taken to room 8.  The patient was then placed supine on the table and prepped and draped in the usual sterile fashion.  A time out was called.  Ultrasound was used to evaluate the right common femoral vein which was patent and compressible.  The area was anesthetized 1% lidocaine cannulated with direct ultrasound visualization with 18-gauge needle.  Images saved the permanent record.  Bentson wires placed centrally under fluoroscopic guidance.  The introducer sheath was placed.  Central venogram was performed with the above findings.  We then placed a filter just below the lowest right renal vein and deployed it.  Sheath and wire were removed.  He tolerated procedure without any complication. Contrast: 30 cc Brandon C. Donzetta Matters, MD [facility] Office: [phone] Pager: [phone]  DG Chest Portable 1 View  Result Date: 06/09/2019 CLINICAL DATA:  79 year old male with sepsis. EXAM: PORTABLE CHEST 1 VIEW COMPARISON:  Chest radiograph dated 01/13/2015 and CT dated 01/07/2012. FINDINGS: Background of emphysema. No focal consolidation, pleural effusion, pneumothorax. The cardiac silhouette is within normal limits. Atherosclerotic calcification of the aorta. No acute osseous pathology. Upper abdominal surgical suture. IMPRESSION: 1. No acute cardiopulmonary process. 2. Emphysema. Electronically Signed   By: Anner Crete M.D.   On: 06/09/2019 17:47    Microbiology: Recent Results (from the past 240 hour(s))  Culture, blood (Routine x 2)     Status: Abnormal   Collection Time: 06/09/19  2:25 PM   Specimen: BLOOD  Result Value Ref Range Status   Specimen Description BLOOD RIGHT ANTECUBITAL  Final   Special Requests   Final    BOTTLES DRAWN AEROBIC AND ANAEROBIC Blood Culture adequate volume   Culture  Setup Time   Final    GRAM POSITIVE RODS AEROBIC  BOTTLE ONLY CRITICAL RESULT CALLED TO, READ BACK BY AND VERIFIED WITH: PHARMD J LADFORD [id number] AT 724 AM BY CM    Culture (A)  Final    DIPHTHEROIDS(CORYNEBACTERIUM SPECIES) Standardized susceptibility testing for this organism is not available. Performed at [facility], [place] [address], [place], [place] [address]    Report Status 06/14/2019 FINAL  Final  Culture, blood (Routine x 2)     Status: Abnormal   Collection Time: 06/09/19  2:44 PM   Specimen: BLOOD  Result Value Ref Range Status   Specimen Description BLOOD LEFT ANTECUBITAL  Final   Special Requests   Final    BOTTLES DRAWN AEROBIC AND ANAEROBIC Blood Culture adequate volume   Culture  Setup Time   Final    GRAM POSITIVE COCCI IN CLUSTERS AEROBIC BOTTLE  ONLY CRITICAL RESULT CALLED TO, READ BACK BY AND VERIFIED WITH: PHARMD M MCCARTHY [id number] AT 22 BY CM    Culture (A)  Final    STAPHYLOCOCCUS SPECIES (COAGULASE NEGATIVE) THE SIGNIFICANCE OF ISOLATING THIS ORGANISM FROM A SINGLE SET OF BLOOD CULTURES WHEN MULTIPLE SETS ARE DRAWN IS UNCERTAIN. PLEASE NOTIFY THE MICROBIOLOGY DEPARTMENT WITHIN ONE WEEK IF SPECIATION AND SENSITIVITIES ARE REQUIRED. Performed at [facility], [place] [address], [place], [place] [address]    Report Status 06/11/2019 FINAL  Final  SARS CORONAVIRUS 2 (TAT 6-24 HRS) Nasopharyngeal Nasopharyngeal Swab     Status: None   Collection Time: 06/09/19  7:39 PM   Specimen: Nasopharyngeal Swab  Result Value Ref Range Status   SARS Coronavirus 2 NEGATIVE NEGATIVE Final    Comment: (NOTE) SARS-CoV-2 target nucleic acids are NOT DETECTED. The SARS-CoV-2 RNA is generally detectable in upper and lower respiratory specimens during the acute phase of infection. Negative results do not preclude SARS-CoV-2 infection, do not rule out co-infections with other pathogens, and should not be used as the sole basis for treatment or other patient management decisions. Negative results must be combined with  clinical observations, patient history, and epidemiological information. The expected result is Negative. Fact Sheet for Patients: SugarRoll.be Fact Sheet for Healthcare Providers: https://www.woods-mathews.com/ This test is not yet approved or cleared by the [place] FDA and  has been authorized for detection and/or diagnosis of SARS-CoV-2 by FDA under an Emergency Use Authorization (EUA). This EUA will remain  in effect (meaning this test can be used) for the duration of the COVID-19 declaration under Section 56 4(b)(1) of the Act, 21 U.S.C. section 360bbb-3(b)(1), unless the authorization is terminated or revoked sooner. Performed at [facility], [place] [address] [place], [place] [address]   Urine culture     Status: Abnormal   Collection Time: 06/09/19  9:59 PM   Specimen: Urine, Random  Result Value Ref Range Status   Specimen Description URINE, RANDOM  Final   Special Requests NONE  Final   Culture (A)  Final    >=100,000 COLONIES/mL DIPHTHEROIDS(CORYNEBACTERIUM SPECIES) Standardized susceptibility testing for this organism is not available. Performed at [facility], [place] [address], [place], [place] [address]    Report Status 06/11/2019 FINAL  Final  Culture, Urine     Status: None   Collection Time: 06/14/19 11:32 AM   Specimen: Urine, Random  Result Value Ref Range Status   Specimen Description URINE, RANDOM  Final   Special Requests NONE  Final   Culture   Final    NO GROWTH Performed at [facility], [place] [address], [place],  [address]    Report Status 06/15/2019 FINAL  Final     Labs: Basic Metabolic Panel: Recent Labs  Lab 06/11/19 0714 06/12/19 0544 06/13/19 0751 06/14/19 0145 06/15/19 0151  NA 142 134* 130* 132* 133*  K 3.3* 3.6 3.1* 3.5 4.2  CL 104 100 95* 101 103  CO2 26 26 26 23 22   GLUCOSE 92 93 89 121* 99  BUN 32* 18 12 10 12   CREATININE 1.59* 1.14 1.07 1.01 1.02   CALCIUM 8.0* 7.9* 7.9* 7.7* 7.8*  MG  --   --   --  1.7  --   PHOS  --   --   --  1.9*  --    Liver Function Tests: Recent Labs  Lab 06/09/19 1425 06/14/19 0145  AST 23  --   ALT  25  --   ALKPHOS 66  --   BILITOT 1.0  --   PROT 5.6*  --   ALBUMIN 2.4* 1.7*   No results for input(s): LIPASE, AMYLASE in the last 168 hours. Recent Labs  Lab 06/09/19 2137  AMMONIA 53*   CBC: Recent Labs  Lab 06/09/19 1425 06/09/19 2137 06/11/19 1831 06/12/19 0544 06/13/19 0751 06/14/19 0145 06/15/19 0151  WBC 8.7   < > 9.4 9.5 11.8* 12.5* 12.3*  NEUTROABS 7.7  --   --   --   --  10.5* 10.3*  HGB 6.4*   < > 8.4* 7.7* 8.2* 7.9* 8.6*  HCT 19.5*   < > 25.5* 23.8* 25.0* 24.0* 26.1*  MCV 88.6   < > 88.2 89.5 88.7 87.3 87.0  PLT 343   < > 362 368 418* 433* 444*   < > = values in this interval not displayed.   Cardiac Enzymes: Recent Labs  Lab 06/09/19 1425  CKTOTAL 81   BNP: BNP (last 3 results) No results for input(s): BNP in the last 8760 hours.  ProBNP (last 3 results) No results for input(s): PROBNP in the last 8760 hours.  CBG: Recent Labs  Lab 06/09/19 2229  GLUCAP 108*       Signed:  Irine Seal MD.  [facility] 06/15/2019, 10:47 AM

## 2019-06-15 NOTE — Plan of Care (Signed)

## 2019-06-15 NOTE — TOC Transition Note (Signed)
Transition of Care [facility]) - CM/SW Discharge Note   Patient Details  Name: Henry Carter MRN: [id number] Date of Birth: 07/29/1940  [facility] CM/SW Contact:  Maryclare Labrador, RN Phone Number: 06/15/2019, 11:44 AM   Clinical Narrative:   Pt deemed stable for discharge home today.  [name] informed that pt will discharge home today.  No other CM needs determined - CM signing off    Final next level of care: [place] Barriers to Discharge: Barriers Resolved   Patient Goals and CMS Choice Patient states their goals for this hospitalization and ongoing recovery are:: Pt states he is ready to get home right now - did not specify goals CMS Medicare.gov Compare Post Acute Care list provided to:: Patient Choice offered to / list presented to : Patient, Spouse  Discharge Placement                       Discharge Plan and Services                          HH Arranged: RN, PT, OT, Nurse's Aide, Refused SNF, Social Work [facility] Agency: [place] Date [facility] Agency Contacted: 06/14/19 Time [place]: [phone] Representative spoke with at [place]: [place] Interventions     Readmission Risk Interventions No flowsheet data found.

## 2019-06-18 ENCOUNTER — Other Ambulatory Visit: Payer: Self-pay

## 2019-06-18 ENCOUNTER — Telehealth: Payer: Self-pay | Admitting: Internal Medicine

## 2019-06-18 NOTE — Patient Outreach (Signed)
[facility]) Care Management  06/18/2019  Ignacio Gianni 04-Jun-1940 [id number]   Referral received discharged from an inpatient admission from [facility] on 06/15/19.  No outreach warranted at this time.  Transition of Care calls being completed via EMMI. RN CM will outreach patient for any red flags received.    Plan: RN CM will close case.    Jone Baseman, RN, MSN [facility] Care Management Coordinator Direct Line [phone] Cell [phone] Toll Free: [phone]  Fax: [phone]

## 2019-06-18 NOTE — Telephone Encounter (Signed)
Alma Downs has been informed

## 2019-06-18 NOTE — Telephone Encounter (Signed)
New message:   Agapito Games is calling from [facility] to get verbal orders to move patients home assessment to 06/20/19 due to the patients request. Please advise.

## 2019-06-18 NOTE — Telephone Encounter (Signed)
Fine

## 2019-06-19 LAB — CULTURE, BLOOD (ROUTINE X 2)
Culture: NO GROWTH
Culture: NO GROWTH
Special Requests: ADEQUATE

## 2019-06-20 DIAGNOSIS — N179 Acute kidney failure, unspecified: Secondary | ICD-10-CM | POA: Diagnosis not present

## 2019-06-20 DIAGNOSIS — N401 Enlarged prostate with lower urinary tract symptoms: Secondary | ICD-10-CM | POA: Diagnosis not present

## 2019-06-20 DIAGNOSIS — J439 Emphysema, unspecified: Secondary | ICD-10-CM | POA: Diagnosis not present

## 2019-06-20 DIAGNOSIS — I10 Essential (primary) hypertension: Secondary | ICD-10-CM | POA: Diagnosis not present

## 2019-06-20 DIAGNOSIS — G9341 Metabolic encephalopathy: Secondary | ICD-10-CM | POA: Diagnosis not present

## 2019-06-20 DIAGNOSIS — Z466 Encounter for fitting and adjustment of urinary device: Secondary | ICD-10-CM | POA: Diagnosis not present

## 2019-06-20 DIAGNOSIS — I82402 Acute embolism and thrombosis of unspecified deep veins of left lower extremity: Secondary | ICD-10-CM | POA: Diagnosis not present

## 2019-06-20 DIAGNOSIS — N138 Other obstructive and reflux uropathy: Secondary | ICD-10-CM | POA: Diagnosis not present

## 2019-06-20 DIAGNOSIS — I70219 Atherosclerosis of native arteries of extremities with intermittent claudication, unspecified extremity: Secondary | ICD-10-CM | POA: Diagnosis not present

## 2019-06-21 ENCOUNTER — Telehealth: Payer: Self-pay

## 2019-06-21 DIAGNOSIS — I10 Essential (primary) hypertension: Secondary | ICD-10-CM | POA: Diagnosis not present

## 2019-06-21 DIAGNOSIS — J439 Emphysema, unspecified: Secondary | ICD-10-CM | POA: Diagnosis not present

## 2019-06-21 DIAGNOSIS — N401 Enlarged prostate with lower urinary tract symptoms: Secondary | ICD-10-CM | POA: Diagnosis not present

## 2019-06-21 DIAGNOSIS — N179 Acute kidney failure, unspecified: Secondary | ICD-10-CM | POA: Diagnosis not present

## 2019-06-21 DIAGNOSIS — N138 Other obstructive and reflux uropathy: Secondary | ICD-10-CM | POA: Diagnosis not present

## 2019-06-21 DIAGNOSIS — G9341 Metabolic encephalopathy: Secondary | ICD-10-CM | POA: Diagnosis not present

## 2019-06-21 DIAGNOSIS — I82402 Acute embolism and thrombosis of unspecified deep veins of left lower extremity: Secondary | ICD-10-CM | POA: Diagnosis not present

## 2019-06-21 DIAGNOSIS — I70219 Atherosclerosis of native arteries of extremities with intermittent claudication, unspecified extremity: Secondary | ICD-10-CM | POA: Diagnosis not present

## 2019-06-21 DIAGNOSIS — Z466 Encounter for fitting and adjustment of urinary device: Secondary | ICD-10-CM | POA: Diagnosis not present

## 2019-06-21 NOTE — Telephone Encounter (Signed)
Verbal orders given  

## 2019-06-21 NOTE — Telephone Encounter (Signed)
Ok to proceed. 

## 2019-06-21 NOTE — Telephone Encounter (Signed)
Fine

## 2019-06-21 NOTE — Telephone Encounter (Signed)
New message   Verbal order for physical therapy  for 9 weeks   1 x weeks for 2 week  2 x weeks for 3 weeks  1 x weeks for 1 week  2 x week for 1 week   1 x week for 2 weeks

## 2019-06-22 DIAGNOSIS — J439 Emphysema, unspecified: Secondary | ICD-10-CM | POA: Diagnosis not present

## 2019-06-22 DIAGNOSIS — N179 Acute kidney failure, unspecified: Secondary | ICD-10-CM | POA: Diagnosis not present

## 2019-06-22 DIAGNOSIS — I70219 Atherosclerosis of native arteries of extremities with intermittent claudication, unspecified extremity: Secondary | ICD-10-CM | POA: Diagnosis not present

## 2019-06-22 DIAGNOSIS — I82402 Acute embolism and thrombosis of unspecified deep veins of left lower extremity: Secondary | ICD-10-CM | POA: Diagnosis not present

## 2019-06-22 DIAGNOSIS — Z466 Encounter for fitting and adjustment of urinary device: Secondary | ICD-10-CM | POA: Diagnosis not present

## 2019-06-22 DIAGNOSIS — N401 Enlarged prostate with lower urinary tract symptoms: Secondary | ICD-10-CM | POA: Diagnosis not present

## 2019-06-22 DIAGNOSIS — N138 Other obstructive and reflux uropathy: Secondary | ICD-10-CM | POA: Diagnosis not present

## 2019-06-22 DIAGNOSIS — I10 Essential (primary) hypertension: Secondary | ICD-10-CM | POA: Diagnosis not present

## 2019-06-22 DIAGNOSIS — G9341 Metabolic encephalopathy: Secondary | ICD-10-CM | POA: Diagnosis not present

## 2019-06-26 DIAGNOSIS — N170 Acute kidney failure with tubular necrosis: Secondary | ICD-10-CM | POA: Diagnosis not present

## 2019-06-26 DIAGNOSIS — R310 Gross hematuria: Secondary | ICD-10-CM | POA: Diagnosis not present

## 2019-06-26 DIAGNOSIS — R972 Elevated prostate specific antigen [PSA]: Secondary | ICD-10-CM | POA: Diagnosis not present

## 2019-06-26 DIAGNOSIS — R338 Other retention of urine: Secondary | ICD-10-CM | POA: Diagnosis not present

## 2019-06-27 DIAGNOSIS — I70219 Atherosclerosis of native arteries of extremities with intermittent claudication, unspecified extremity: Secondary | ICD-10-CM | POA: Diagnosis not present

## 2019-06-27 DIAGNOSIS — G9341 Metabolic encephalopathy: Secondary | ICD-10-CM | POA: Diagnosis not present

## 2019-06-27 DIAGNOSIS — Z466 Encounter for fitting and adjustment of urinary device: Secondary | ICD-10-CM | POA: Diagnosis not present

## 2019-06-27 DIAGNOSIS — I82402 Acute embolism and thrombosis of unspecified deep veins of left lower extremity: Secondary | ICD-10-CM | POA: Diagnosis not present

## 2019-06-27 DIAGNOSIS — N179 Acute kidney failure, unspecified: Secondary | ICD-10-CM | POA: Diagnosis not present

## 2019-06-27 DIAGNOSIS — N401 Enlarged prostate with lower urinary tract symptoms: Secondary | ICD-10-CM | POA: Diagnosis not present

## 2019-06-27 DIAGNOSIS — J439 Emphysema, unspecified: Secondary | ICD-10-CM | POA: Diagnosis not present

## 2019-06-27 DIAGNOSIS — N138 Other obstructive and reflux uropathy: Secondary | ICD-10-CM | POA: Diagnosis not present

## 2019-06-27 DIAGNOSIS — I10 Essential (primary) hypertension: Secondary | ICD-10-CM | POA: Diagnosis not present

## 2019-06-28 DIAGNOSIS — G9341 Metabolic encephalopathy: Secondary | ICD-10-CM | POA: Diagnosis not present

## 2019-06-28 DIAGNOSIS — N179 Acute kidney failure, unspecified: Secondary | ICD-10-CM | POA: Diagnosis not present

## 2019-06-28 DIAGNOSIS — I10 Essential (primary) hypertension: Secondary | ICD-10-CM | POA: Diagnosis not present

## 2019-06-28 DIAGNOSIS — J439 Emphysema, unspecified: Secondary | ICD-10-CM | POA: Diagnosis not present

## 2019-06-28 DIAGNOSIS — N138 Other obstructive and reflux uropathy: Secondary | ICD-10-CM | POA: Diagnosis not present

## 2019-06-28 DIAGNOSIS — I82402 Acute embolism and thrombosis of unspecified deep veins of left lower extremity: Secondary | ICD-10-CM | POA: Diagnosis not present

## 2019-06-28 DIAGNOSIS — I70219 Atherosclerosis of native arteries of extremities with intermittent claudication, unspecified extremity: Secondary | ICD-10-CM | POA: Diagnosis not present

## 2019-06-28 DIAGNOSIS — Z466 Encounter for fitting and adjustment of urinary device: Secondary | ICD-10-CM | POA: Diagnosis not present

## 2019-06-28 DIAGNOSIS — N401 Enlarged prostate with lower urinary tract symptoms: Secondary | ICD-10-CM | POA: Diagnosis not present

## 2019-06-29 ENCOUNTER — Telehealth: Payer: Self-pay | Admitting: Internal Medicine

## 2019-06-29 ENCOUNTER — Encounter (HOSPITAL_COMMUNITY): Payer: Self-pay | Admitting: Emergency Medicine

## 2019-06-29 ENCOUNTER — Other Ambulatory Visit: Payer: Self-pay

## 2019-06-29 ENCOUNTER — Inpatient Hospital Stay (HOSPITAL_COMMUNITY)
Admission: EM | Admit: 2019-06-29 | Discharge: 2019-07-06 | DRG: 699 | Disposition: A | Discharge status: Skilled Nursing Facility | Payer: Medicare HMO | Source: Ambulatory Visit | Attending: Internal Medicine | Admitting: Internal Medicine

## 2019-06-29 ENCOUNTER — Observation Stay (HOSPITAL_COMMUNITY): Payer: Medicare HMO

## 2019-06-29 ENCOUNTER — Ambulatory Visit (INDEPENDENT_AMBULATORY_CARE_PROVIDER_SITE_OTHER): Payer: Medicare HMO | Admitting: Internal Medicine

## 2019-06-29 ENCOUNTER — Encounter: Payer: Self-pay | Admitting: Internal Medicine

## 2019-06-29 DIAGNOSIS — J449 Chronic obstructive pulmonary disease, unspecified: Secondary | ICD-10-CM | POA: Diagnosis present

## 2019-06-29 DIAGNOSIS — Z9119 Patient's noncompliance with other medical treatment and regimen: Secondary | ICD-10-CM

## 2019-06-29 DIAGNOSIS — N179 Acute kidney failure, unspecified: Secondary | ICD-10-CM | POA: Diagnosis not present

## 2019-06-29 DIAGNOSIS — Z978 Presence of other specified devices: Secondary | ICD-10-CM

## 2019-06-29 DIAGNOSIS — Z79899 Other long term (current) drug therapy: Secondary | ICD-10-CM | POA: Diagnosis not present

## 2019-06-29 DIAGNOSIS — Z95828 Presence of other vascular implants and grafts: Secondary | ICD-10-CM

## 2019-06-29 DIAGNOSIS — D500 Iron deficiency anemia secondary to blood loss (chronic): Secondary | ICD-10-CM | POA: Diagnosis not present

## 2019-06-29 DIAGNOSIS — N3289 Other specified disorders of bladder: Secondary | ICD-10-CM | POA: Diagnosis not present

## 2019-06-29 DIAGNOSIS — E785 Hyperlipidemia, unspecified: Secondary | ICD-10-CM | POA: Diagnosis not present

## 2019-06-29 DIAGNOSIS — Z86718 Personal history of other venous thrombosis and embolism: Secondary | ICD-10-CM | POA: Diagnosis not present

## 2019-06-29 DIAGNOSIS — I739 Peripheral vascular disease, unspecified: Secondary | ICD-10-CM | POA: Diagnosis not present

## 2019-06-29 DIAGNOSIS — Z87891 Personal history of nicotine dependence: Secondary | ICD-10-CM

## 2019-06-29 DIAGNOSIS — I70219 Atherosclerosis of native arteries of extremities with intermittent claudication, unspecified extremity: Secondary | ICD-10-CM | POA: Diagnosis not present

## 2019-06-29 DIAGNOSIS — K219 Gastro-esophageal reflux disease without esophagitis: Secondary | ICD-10-CM | POA: Diagnosis not present

## 2019-06-29 DIAGNOSIS — K5900 Constipation, unspecified: Secondary | ICD-10-CM | POA: Diagnosis present

## 2019-06-29 DIAGNOSIS — Z20822 Contact with and (suspected) exposure to covid-19: Secondary | ICD-10-CM | POA: Diagnosis present

## 2019-06-29 DIAGNOSIS — D62 Acute posthemorrhagic anemia: Secondary | ICD-10-CM

## 2019-06-29 DIAGNOSIS — E872 Acidosis: Secondary | ICD-10-CM | POA: Diagnosis not present

## 2019-06-29 DIAGNOSIS — M2550 Pain in unspecified joint: Secondary | ICD-10-CM | POA: Diagnosis not present

## 2019-06-29 DIAGNOSIS — I82402 Acute embolism and thrombosis of unspecified deep veins of left lower extremity: Secondary | ICD-10-CM | POA: Diagnosis not present

## 2019-06-29 DIAGNOSIS — T83038A Leakage of other indwelling urethral catheter, initial encounter: Secondary | ICD-10-CM | POA: Diagnosis not present

## 2019-06-29 DIAGNOSIS — N136 Pyonephrosis: Secondary | ICD-10-CM | POA: Diagnosis present

## 2019-06-29 DIAGNOSIS — T83091D Other mechanical complication of indwelling urethral catheter, subsequent encounter: Secondary | ICD-10-CM | POA: Diagnosis not present

## 2019-06-29 DIAGNOSIS — R310 Gross hematuria: Secondary | ICD-10-CM | POA: Diagnosis not present

## 2019-06-29 DIAGNOSIS — R5381 Other malaise: Secondary | ICD-10-CM | POA: Diagnosis not present

## 2019-06-29 DIAGNOSIS — G9341 Metabolic encephalopathy: Secondary | ICD-10-CM | POA: Diagnosis not present

## 2019-06-29 DIAGNOSIS — N400 Enlarged prostate without lower urinary tract symptoms: Secondary | ICD-10-CM | POA: Diagnosis not present

## 2019-06-29 DIAGNOSIS — R1084 Generalized abdominal pain: Secondary | ICD-10-CM | POA: Diagnosis not present

## 2019-06-29 DIAGNOSIS — Y846 Urinary catheterization as the cause of abnormal reaction of the patient, or of later complication, without mention of misadventure at the time of the procedure: Secondary | ICD-10-CM | POA: Diagnosis present

## 2019-06-29 DIAGNOSIS — R972 Elevated prostate specific antigen [PSA]: Secondary | ICD-10-CM | POA: Diagnosis not present

## 2019-06-29 DIAGNOSIS — N139 Obstructive and reflux uropathy, unspecified: Secondary | ICD-10-CM

## 2019-06-29 DIAGNOSIS — J439 Emphysema, unspecified: Secondary | ICD-10-CM | POA: Diagnosis not present

## 2019-06-29 DIAGNOSIS — T83091A Other mechanical complication of indwelling urethral catheter, initial encounter: Principal | ICD-10-CM

## 2019-06-29 DIAGNOSIS — I10 Essential (primary) hypertension: Secondary | ICD-10-CM | POA: Diagnosis present

## 2019-06-29 DIAGNOSIS — N138 Other obstructive and reflux uropathy: Secondary | ICD-10-CM | POA: Diagnosis not present

## 2019-06-29 DIAGNOSIS — I714 Abdominal aortic aneurysm, without rupture: Secondary | ICD-10-CM | POA: Diagnosis present

## 2019-06-29 DIAGNOSIS — Z7401 Bed confinement status: Secondary | ICD-10-CM | POA: Diagnosis not present

## 2019-06-29 DIAGNOSIS — R338 Other retention of urine: Secondary | ICD-10-CM | POA: Diagnosis not present

## 2019-06-29 DIAGNOSIS — R52 Pain, unspecified: Secondary | ICD-10-CM | POA: Diagnosis not present

## 2019-06-29 DIAGNOSIS — Z466 Encounter for fitting and adjustment of urinary device: Secondary | ICD-10-CM | POA: Diagnosis not present

## 2019-06-29 DIAGNOSIS — N401 Enlarged prostate with lower urinary tract symptoms: Secondary | ICD-10-CM | POA: Diagnosis not present

## 2019-06-29 DIAGNOSIS — Z03818 Encounter for observation for suspected exposure to other biological agents ruled out: Secondary | ICD-10-CM | POA: Diagnosis not present

## 2019-06-29 LAB — CBC WITH DIFFERENTIAL/PLATELET
Abs Immature Granulocytes: 0.12 10*3/uL — ABNORMAL HIGH (ref 0.00–0.07)
Basophils Absolute: 0 10*3/uL (ref 0.0–0.1)
Basophils Relative: 0 %
Eosinophils Absolute: 0 10*3/uL (ref 0.0–0.5)
Eosinophils Relative: 0 %
HCT: 30.2 % — ABNORMAL LOW (ref 39.0–52.0)
Hemoglobin: 9.4 g/dL — ABNORMAL LOW (ref 13.0–17.0)
Immature Granulocytes: 1 %
Lymphocytes Relative: 5 %
Lymphs Abs: 0.8 10*3/uL (ref 0.7–4.0)
MCH: 27.2 pg (ref 26.0–34.0)
MCHC: 31.1 g/dL (ref 30.0–36.0)
MCV: 87.5 fL (ref 80.0–100.0)
Monocytes Absolute: 0.9 10*3/uL (ref 0.1–1.0)
Monocytes Relative: 6 %
Neutro Abs: 12.6 10*3/uL — ABNORMAL HIGH (ref 1.7–7.7)
Neutrophils Relative %: 88 %
Platelets: 577 10*3/uL — ABNORMAL HIGH (ref 150–400)
RBC: 3.45 MIL/uL — ABNORMAL LOW (ref 4.22–5.81)
RDW: 16.8 % — ABNORMAL HIGH (ref 11.5–15.5)
WBC: 14.4 10*3/uL — ABNORMAL HIGH (ref 4.0–10.5)
nRBC: 0 % (ref 0.0–0.2)

## 2019-06-29 LAB — BASIC METABOLIC PANEL
Anion gap: 12 (ref 5–15)
BUN: 44 mg/dL — ABNORMAL HIGH (ref 8–23)
CO2: 19 mmol/L — ABNORMAL LOW (ref 22–32)
Calcium: 8.8 mg/dL — ABNORMAL LOW (ref 8.9–10.3)
Chloride: 99 mmol/L (ref 98–111)
Creatinine, Ser: 4.76 mg/dL — ABNORMAL HIGH (ref 0.61–1.24)
GFR calc Af Amer: 13 mL/min — ABNORMAL LOW (ref >60)
GFR calc non Af Amer: 11 mL/min — ABNORMAL LOW (ref >60)
Glucose, Bld: 93 mg/dL (ref 70–99)
Potassium: 4.4 mmol/L (ref 3.5–5.1)
Sodium: 130 mmol/L — ABNORMAL LOW (ref 135–145)

## 2019-06-29 LAB — URINALYSIS, ROUTINE W REFLEX MICROSCOPIC

## 2019-06-29 LAB — URINALYSIS, MICROSCOPIC (REFLEX)
Squamous Epithelial / HPF: NONE SEEN (ref 0–5)
WBC, UA: >50 WBC/hpf (ref 0–5)

## 2019-06-29 MED ORDER — SIMVASTATIN 20 MG PO TABS
20.0000 mg | ORAL_TABLET | Freq: Every day | ORAL | Status: DC
  Administered 2019-06-30 – 2019-07-05 (×7): 20 mg via ORAL
  Filled 2019-06-29: qty 2
  Filled 2019-06-29: qty 1
  Filled 2019-06-29: qty 2
  Filled 2019-06-29 (×6): qty 1
  Filled 2019-06-29 (×5): qty 2
  Filled 2019-06-29: qty 1

## 2019-06-29 MED ORDER — ONDANSETRON HCL 4 MG PO TABS: 4.0000 mg | ORAL_TABLET | Freq: Four times a day (QID) | ORAL | Status: DC | PRN

## 2019-06-29 MED ORDER — SODIUM CHLORIDE 0.9 % IV SOLN: INTRAVENOUS | Status: AC

## 2019-06-29 MED ORDER — CIPROFLOXACIN HCL 500 MG PO TABS: 500.0000 mg | ORAL_TABLET | Freq: Two times a day (BID) | ORAL | Status: DC

## 2019-06-29 MED ORDER — SODIUM CHLORIDE 0.9 % IV BOLUS
1000.0000 mL | Freq: Once | INTRAVENOUS | Status: AC
  Administered 2019-06-29: 1000 mL via INTRAVENOUS

## 2019-06-29 MED ORDER — ACETAMINOPHEN 325 MG PO TABS
650.0000 mg | ORAL_TABLET | Freq: Four times a day (QID) | ORAL | Status: DC | PRN
  Administered 2019-06-30 – 2019-07-05 (×4): 650 mg via ORAL
  Filled 2019-06-29 (×4): qty 2

## 2019-06-29 MED ORDER — AMLODIPINE BESYLATE 5 MG PO TABS
10.0000 mg | ORAL_TABLET | Freq: Every day | ORAL | Status: DC
  Administered 2019-06-30 – 2019-07-06 (×7): 10 mg via ORAL
  Filled 2019-06-29 (×7): qty 2

## 2019-06-29 MED ORDER — PANTOPRAZOLE SODIUM 40 MG PO TBEC
40.0000 mg | DELAYED_RELEASE_TABLET | Freq: Two times a day (BID) | ORAL | Status: DC
  Administered 2019-06-30 – 2019-07-06 (×14): 40 mg via ORAL
  Filled 2019-06-29 (×14): qty 1

## 2019-06-29 MED ORDER — ACETAMINOPHEN 650 MG RE SUPP: 650.0000 mg | Freq: Four times a day (QID) | RECTAL | Status: DC | PRN

## 2019-06-29 MED ORDER — CIPROFLOXACIN IN D5W 400 MG/200ML IV SOLN
400.0000 mg | Freq: Once | INTRAVENOUS | Status: AC
  Administered 2019-06-29: 400 mg via INTRAVENOUS
  Filled 2019-06-29: qty 200

## 2019-06-29 MED ORDER — ONDANSETRON HCL 4 MG/2ML IJ SOLN: 4.0000 mg | Freq: Four times a day (QID) | INTRAMUSCULAR | Status: DC | PRN

## 2019-06-29 MED ORDER — CIPROFLOXACIN HCL 500 MG PO TABS
250.0000 mg | ORAL_TABLET | Freq: Two times a day (BID) | ORAL | Status: AC
  Administered 2019-06-30 – 2019-07-02 (×6): 250 mg via ORAL
  Filled 2019-06-29 (×6): qty 1

## 2019-06-29 NOTE — ED Triage Notes (Addendum)
"  My prostate is asking up and I want to pee," the patient states. He reports not having urine in his leg bag the whole day. He also reports pain in his groin.

## 2019-06-29 NOTE — Patient Instructions (Signed)
We will have you go to an urgent care.

## 2019-06-29 NOTE — Assessment & Plan Note (Signed)
Given change in urine output and thick urine he is asked to go to ER for evaluation with bladder scan and BMP.

## 2019-06-29 NOTE — Progress Notes (Signed)
Subjective:   Patient ID: Henry Carter, male    DOB: [birth year], 79 y.o.   MRN: [id number]  HPI The patient is a 79 YO man coming in for concerns about groin pain. Was in hospital recently with bleeding and blocked catheter. He had IVC filter placed as his blood thinner had to be stopped. Recent DVT potentially due to constriction from catheter bag. He is not taking any blood thinners at this time. He saw the urologist and had catheter changed out of Tuesday (him and wife cannot remember when and keep going back and forth from Tuesday and Wednesday). They did mention the pain to the urologist and neither of them can recall what was said. They were given some kind of medicine for possible infection. The urologist checked the urine. They cannot recall the name. Med rec lists cipro as recently filled and they say that sounds familiar. Then they mention that they did not fill this until yesterday. Then they say that he took this once yesterday and not today yet. Then they amend this and say he took it twice yesterday and once today already. Denies fevers or chills. Feels that urine output is thick today and not as much as normal. Pain has worsened dramatically since yesterday and is 10/10. Has tried tylenol without relief. Also is feeling dizzy all the time and concerned about him falling. He is barely getting around and has almost fallen many times at home since leaving hospital. They have also noticed a lot of pink urine this week.   Hospital records reviewed, urology records unavailable for review  PM[facility], [facility], social history reviewed and updated.   Review of Systems  Constitutional: Positive for activity change and fatigue.  HENT: Negative.   Eyes: Negative.   Respiratory: Negative for cough, chest tightness and shortness of breath.   Cardiovascular: Negative for chest pain, palpitations and leg swelling.  Gastrointestinal: Positive for abdominal pain. Negative for abdominal distention,  constipation, diarrhea, nausea and vomiting.  Genitourinary: Positive for decreased urine volume.       Groin pain, blood in urine, thick urine  Musculoskeletal: Positive for gait problem and myalgias.  Skin: Negative.   Neurological: Positive for dizziness and weakness.  Psychiatric/Behavioral: Negative.     Objective:  Physical Exam Constitutional:      Appearance: He is well-developed.     Comments: Appears ill  HENT:     Head: Normocephalic and atraumatic.  Cardiovascular:     Rate and Rhythm: Normal rate and regular rhythm.  Pulmonary:     Effort: Pulmonary effort is normal. No respiratory distress.     Breath sounds: Normal breath sounds. No wheezing or rales.  Abdominal:     General: Bowel sounds are normal. There is no distension.     Palpations: Abdomen is soft.     Tenderness: There is abdominal tenderness. There is no rebound.     Comments: Tenderness suprapubic  Musculoskeletal:     Cervical back: Normal range of motion.  Skin:    General: Skin is warm and dry.  Neurological:     Mental Status: He is alert and oriented to person, place, and time.     Coordination: Coordination abnormal.     Comments: Both patient and wife very poor historians, some conflicting details which change throughout the visit     Vitals:   06/29/19 1427  BP: 140/80  Pulse: (!) 57  Temp: 98 F (36.7 C)  TempSrc: Oral  SpO2: 99%  This visit occurred during the SARS-CoV-2 public health emergency.  Safety protocols were in place, including screening questions prior to the visit, additional usage of staff PPE, and extensive cleaning of exam room while observing appropriate contact time as indicated for disinfecting solutions.   Assessment & Plan:  Visit time 15 minutes in face to face communication with patient and coordination of care, additional 15 minutes spent in record review, coordination or care, ordering tests, communicating/referring to other healthcare professionals,  documenting in medical records all on the same day of the visit for total time 30 minutes spent on the visit.

## 2019-06-29 NOTE — ED Provider Notes (Signed)
[facility] DEPT Provider Note   CSN: [id number] Arrival date & time: 06/29/19  1529     History Chief Complaint  Patient presents with  . Urinary Retention    Henry Carter is a 79 y.o. male.  Pt presents to the ED today with a distended bladder.  Pt was admitted from 2/27-3/5 due to bleeding from his chronic foley and weakness.  His urine grew out Corynebacterium diphtheria which was thought to be a skin contaminant.  Abx were stopped.  Pt had his foley exchanged by urology.  Pt also had ARF with Cr up as high as 10.79.  Pt's Cr improved by day of d/c.  Pt also had a recently diagnosed DVT on 1/29 for which he was on Xarelto.  Due to the bleeding, he underwent IVC placement on 3/1.  Pt followed up with his pcp today who sent him in here today because he is having pain and is feeling dizzy.  Pt did see the urologist this week and had his foley changed and he was put on Cipro.  Pt took it twice yesterday and once today.  He is not currently taking Xarelto.  Pt is unsure when he last emptied his foley bag.        Past Medical History:  Diagnosis Date  .  iron deficiency 01/18/2013  . Anemia   . Angioedema   . BPH (benign prostatic hyperplasia)   . COPD (chronic obstructive pulmonary disease) ([place])    STates he has copd  . Essential hypertension   . GERD (gastroesophageal reflux disease)   . Helicobacter pylori gastritis 10/10/2012  . Hyperlipidemia   . IBS (irritable bowel syndrome)   . Impotence   . Peripheral vascular disease, unspecified ([place])   . Personal history of colonic adenoma 09/22/2012  . Personal history of tobacco use, presenting hazards to health     Patient Active Problem List   Diagnosis Date Noted  . Chronic indwelling Foley catheter 06/29/2019  . History of DVT (deep vein thrombosis) 06/29/2019  . Acute bilateral obstructive uropathy   . Hypokalemia   . Acute metabolic encephalopathy   . Hyponatremia   . Hematuria  06/09/2019  . Acute renal failure (ARF) ([place]) 06/09/2019  . Acute blood loss anemia 06/09/2019  . Hyperkalemia 06/09/2019  . Metabolic acidosis 99991111  . Left leg DVT ([place]) 05/18/2019  . Left leg swelling 05/11/2019  . Pre-operative cardiovascular examination 03/22/2019  . 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[place]) 07/11/2018  . Right arm weakness 07/21/2017  . Non compliance w medication regimen 07/30/2016  . 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 Mild cognitive impairment 12/11/2015  . 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 Healthcare maintenance 02/07/2013  . Iron deficiency anemia due to chronic blood loss 01/18/2013  . C O P D 04/08/2010  . Atherosclerosis of native artery of extremity with intermittent claudication ([place]) 11/26/2008  . Benign prostatic hyperplasia 10/20/2007  . PERIPHERAL VASCULAR DISEASE 05/19/2007  . Essential hypertension 02/01/2007  . Hyperlipidemia 01/03/2007    Past Surgical History:  Procedure Laterality Date  . abdominal surg for ulcers'  1980  . COLONOSCOPY N/A 09/22/2012   Procedure: COLONOSCOPY;  Surgeon: Gatha Mayer, MD;  Location: [facility];  Service: Endoscopy;  Laterality: N/A;  . distal aortogram    . IVC FILTER INSERTION N/A 06/11/2019   Procedure: IVC FILTER INSERTION;  Surgeon: Waynetta Sandy, MD;  Location: [facility];  Service: Cardiovascular;  Laterality: N/A;  .  IVC VENOGRAPHY N/A 06/11/2019   Procedure: IVC Venography;  Surgeon: Waynetta Sandy, MD;  Location: [facility];  Service: Cardiovascular;  Laterality: N/A;  . percutaneous transluminal angionplasty with placement of 2 self expanding stent in the distal left superfical femeral artery         Family History  Problem Relation Age of Onset  . Alcohol abuse Father   . Kidney disease Brother   . Drug abuse Brother   . Diabetes Brother   . Diabetes Mother   . Diabetes Brother   . Colon cancer Neg Hx     Social History    Tobacco Use  . Smoking status: Former Smoker    Packs/day: 0.25    Years: 50.00    Pack years: 12.50    Types: Cigarettes    Start date: 04/12/1957    Quit date: 04/12/2014    Years since quitting: 5.2  . Smokeless tobacco: Never Used  . Tobacco comment: using chatix; stopped now; took x 1 week / had stopped 3 years prior to starting back in 2005  Substance Use Topics  . Alcohol use: No    Alcohol/week: 0.0 standard drinks    Comment: OCC  . Drug use: Yes    Comment: States marijuanna relaxes him;     Home Medications Prior to Admission medications   Medication Sig Start Date End Date Taking? Authorizing Provider  amLODipine (NORVASC) 10 MG tablet Take 1 tablet (10 mg total) by mouth daily. 06/16/19  Yes Eugenie Filler, MD  ciprofloxacin (CIPRO) 250 MG tablet Take 250 mg by mouth 2 (two) times daily. Starting 06/27/2019 for 7 days. 06/26/19  Yes [provider]  pantoprazole (PROTONIX) 40 MG tablet Take 1 tablet (40 mg total) by mouth 2 (two) times daily. 06/15/19  Yes Eugenie Filler, MD  simvastatin (ZOCOR) 20 MG tablet Take 1 tablet (20 mg total) by mouth at bedtime. 07/13/18  Yes [name], MD    Allergies    Ace inhibitors and Penicillins  Review of Systems   Review of Systems  Genitourinary: Positive for difficulty urinating.  All other systems reviewed and are negative.   Physical Exam Updated Vital Signs BP (!) 144/87 (BP Location: Left Arm)   Pulse 98   Temp 97.6 F (36.4 C) (Oral)   Resp 18   SpO2 99%   Physical Exam Vitals and nursing note reviewed.  Constitutional:      Appearance: Normal appearance.  HENT:     Head: Normocephalic and atraumatic.     Right Ear: External ear normal.     Left Ear: External ear normal.     Nose: Nose normal.     Mouth/Throat:     Mouth: Mucous membranes are dry.  Eyes:     Extraocular Movements: Extraocular movements intact.     Conjunctiva/sclera: Conjunctivae normal.     Pupils: Pupils are  equal, round, and reactive to light.  Cardiovascular:     Rate and Rhythm: Normal rate and regular rhythm.  Pulmonary:     Effort: Pulmonary effort is normal.     Breath sounds: Normal breath sounds.  Abdominal:     General: Abdomen is flat. Bowel sounds are normal.     Palpations: Abdomen is soft.     Tenderness: There is abdominal tenderness in the suprapubic area.     Comments: Bladder feels very distended  Musculoskeletal:        General: Normal range of motion.  Cervical back: Normal range of motion and neck supple.  Skin:    General: Skin is warm.     Capillary Refill: Capillary refill takes less than 2 seconds.  Neurological:     General: No focal deficit present.     Mental Status: He is alert and oriented to person, place, and time.  Psychiatric:        Mood and Affect: Mood normal.        Behavior: Behavior normal.     ED Results / Procedures / Treatments   Labs (all labs ordered are listed, but only abnormal results are displayed) Labs Reviewed  CBC WITH DIFFERENTIAL/PLATELET - Abnormal; Notable for the following components:      Result Value   WBC 14.4 (*)    RBC 3.45 (*)    Hemoglobin 9.4 (*)    HCT 30.2 (*)    RDW 16.8 (*)    Platelets 577 (*)    Neutro Abs 12.6 (*)    Abs Immature Granulocytes 0.12 (*)    All other components within normal limits  BASIC METABOLIC PANEL - Abnormal; Notable for the following components:   Sodium 130 (*)    CO2 19 (*)    BUN 44 (*)    Creatinine, Ser 4.76 (*)    Calcium 8.8 (*)    GFR calc non Af Amer 11 (*)    GFR calc Af Amer 13 (*)    All other components within normal limits  URINALYSIS, ROUTINE W REFLEX MICROSCOPIC - Abnormal; Notable for the following components:   Color, Urine BROWN (*)    APPearance TURBID (*)    Glucose, UA   (*)    Value: TEST NOT REPORTED DUE TO COLOR INTERFERENCE OF URINE PIGMENT   Hgb urine dipstick   (*)    Value: TEST NOT REPORTED DUE TO COLOR INTERFERENCE OF URINE PIGMENT    Bilirubin Urine   (*)    Value: TEST NOT REPORTED DUE TO COLOR INTERFERENCE OF URINE PIGMENT   Ketones, ur   (*)    Value: TEST NOT REPORTED DUE TO COLOR INTERFERENCE OF URINE PIGMENT   Protein, ur   (*)    Value: TEST NOT REPORTED DUE TO COLOR INTERFERENCE OF URINE PIGMENT   Nitrite   (*)    Value: TEST NOT REPORTED DUE TO COLOR INTERFERENCE OF URINE PIGMENT   Leukocytes,Ua   (*)    Value: TEST NOT REPORTED DUE TO COLOR INTERFERENCE OF URINE PIGMENT   All other components within normal limits  URINALYSIS, MICROSCOPIC (REFLEX) - Abnormal; Notable for the following components:   Bacteria, UA MANY (*)    All other components within normal limits  URINE CULTURE  SARS CORONAVIRUS 2 (TAT 6-24 HRS)  CULTURE, BLOOD (ROUTINE X 2)  CULTURE, BLOOD (ROUTINE X 2)    EKG None  Radiology No results found.  Procedures Procedures (including critical care time)  Medications Ordered in ED Medications  ciprofloxacin (CIPRO) IVPB 400 mg (has no administration in time range)  sodium chloride 0.9 % bolus 1,000 mL (has no administration in time range)    ED Course  I have reviewed the triage vital signs and the nursing notes.  Pertinent labs & imaging results that were available during my care of the patient were reviewed by me and considered in my medical decision making (see chart for details).    MDM Rules/Calculators/A&P  Pt nurse was able to flush his catheter and urine is now flowing.  Pt is in ARF again which is likely from obstructed urinary flow.  Pt's urine has some blood clots in it, so the foley was likely blocked from a blood clot.  Pt given IVFs.  As urology started pt on cipro, I continued that and gave pt a dose IV.  Urine sent for cx.   Pt d/w Dr. Posey Pronto (triad) for admission.  Final Clinical Impression(s) / ED Diagnoses Final diagnoses:  Obstruction of Foley catheter, initial encounter ([place])  Acute renal failure, unspecified acute renal failure type  ([place])  Gross hematuria    Rx / DC Orders ED Discharge Orders    None       Isla Pence, MD 06/29/19 1836

## 2019-06-29 NOTE — Assessment & Plan Note (Signed)
Needs bladder scan and BMP to rule out recurrence.

## 2019-06-29 NOTE — ED Notes (Signed)
Called floor for report no answer

## 2019-06-29 NOTE — ED Notes (Signed)
Foley flushed with success. Urine return noted.

## 2019-06-29 NOTE — Telephone Encounter (Addendum)
   Murrell Converse calling to report patient is complaining of groin area pain x2 days. No other symptoms Appointment scheduled for 03/19   Phone [phone]

## 2019-06-29 NOTE — H&P (Signed)
History and Physical    Henry Carter [id number] DOB: 1940/12/19 DOA: 06/29/2019  PCP: [name], MD  Patient coming from: PCP office  I have personally briefly reviewed patient's old medical records in [place]  Chief Complaint: Decreased urine output  HPI: Henry Carter is a 79 y.o. male with medical history significant for BPH with chronic indwelling Foley, hypertension, hyperlipidemia, PVD, anemia of iron deficiency and chronic blood loss, history of DVT with IVC filter placed 05/11/2019 (not on anticoagulation due to recent hematuria) who was recently admitted from 05/20/2019-06/15/2019 for acute renal failure secondary to obstructed Foley catheter.  Creatinine was as high as 10.79.  Foley catheter was exchanged by urology creatinine improved to 1.02 on discharge.  He was also noted to have anemia due to hematuria and was transfused 2 units PRBCs.  His Xarelto was discontinued and an IVC filter was placed on 06/11/2019.  Urine culture from 06/09/2019 grew corynebacterium species diphtheroids which was felt to be a skin contaminant.  Patient states that he did follow-up with his urologist earlier this week, either Tuesday or Wednesday, at which time his Foley catheter was exchanged.  He was started on antibiotics with oral ciprofloxacin due to reported UTI which she started yesterday.  Patient states he has seen decrease amount of urine than expected as well as pink urine.  He was seen by his PCP today who noted decreased urine output, bloody urine, and suprapubic tenderness.  He was directed to the ED for further evaluation.  Patient currently denies any abdominal pain.  He denies any subjective fevers, chills, nausea, vomiting, chest pain, or dyspnea.  He denies any other obvious bleeding including epistaxis, hematemesis, hemoptysis, or hematochezia.  ED Course:  Initial vitals showed BP 140/80, pulse 57, RR 18, temp 97.6, SPO2 99% on room air.  Notable for WBC 14.4,  hemoglobin 9.4, platelets 577,000,Labs sodium 130, potassium 4.4, bicarb 19, BUN 44, creatinine 4.76, serum glucose 93.  Urinalysis was limited due to color interference of urine pregnant.  Microscopy showed 21-50 RBC/hpf, greater than 50 WBCs, few bacteria.  Urine culture was obtained and pending.  Blood cultures and SARS-CoV-2 PCR test were ordered and pending.  Patient's catheter was flushed and per EDP now flowing with some blood clots in it.  Patient was given 1 L normal saline and IV ciprofloxacin and the hospitalist service was consulted today for further evaluation and management.  Review of Systems: All systems reviewed and are negative except as documented in history of present illness above.   Past Medical History:  Diagnosis Date  .  iron deficiency 01/18/2013  . Anemia   . Angioedema   . BPH (benign prostatic hyperplasia)   . COPD (chronic obstructive pulmonary disease) ([place])    STates he has copd  . Essential hypertension   . GERD (gastroesophageal reflux disease)   . Helicobacter pylori gastritis 10/10/2012  . Hyperlipidemia   . IBS (irritable bowel syndrome)   . Impotence   . Peripheral vascular disease, unspecified ([place])   . Personal history of colonic adenoma 09/22/2012  . Personal history of tobacco use, presenting hazards to health     Past Surgical History:  Procedure Laterality Date  . abdominal surg for ulcers'  1980  . COLONOSCOPY N/A 09/22/2012   Procedure: COLONOSCOPY;  Surgeon: Gatha Mayer, MD;  Location: [facility];  Service: Endoscopy;  Laterality: N/A;  . distal aortogram    . IVC FILTER INSERTION N/A 06/11/2019   Procedure: IVC FILTER INSERTION;  Surgeon: Waynetta Sandy, MD;  Location: [facility];  Service: Cardiovascular;  Laterality: N/A;  . IVC VENOGRAPHY N/A 06/11/2019   Procedure: IVC Venography;  Surgeon: Waynetta Sandy, MD;  Location: [facility];  Service: Cardiovascular;  Laterality: N/A;  . percutaneous  transluminal angionplasty with placement of 2 self expanding stent in the distal left superfical femeral artery      Social History:  reports that he quit smoking about 5 years ago. His smoking use included cigarettes. He started smoking about 62 years ago. He has a 12.50 pack-year smoking history. He has never used smokeless tobacco. He reports current drug use. He reports that he does not drink alcohol.  Allergies  Allergen Reactions  . Ace Inhibitors     REACTION: angioedema  . Penicillins Hives    Has patient had a PCN reaction causing immediate rash, facial/tongue/throat swelling, SOB or lightheadedness with hypotension: No Has patient had a PCN reaction causing severe rash involving mucus membranes or skin necrosis: No Has patient had a PCN reaction that required hospitalization yes Has patient had a PCN reaction occurring within the last 10 years: no If all of the above answers are "NO", then may proceed with Cephalosporin use.    Family History  Problem Relation Age of Onset  . Alcohol abuse Father   . Kidney disease Brother   . Drug abuse Brother   . Diabetes Brother   . Diabetes Mother   . Diabetes Brother   . Colon cancer Neg Hx      Prior to Admission medications   Medication Sig Start Date End Date Taking? Authorizing Provider  amLODipine (NORVASC) 10 MG tablet Take 1 tablet (10 mg total) by mouth daily. 06/16/19  Yes Eugenie Filler, MD  ciprofloxacin (CIPRO) 250 MG tablet Take 250 mg by mouth.  06/26/19  Yes [provider]  pantoprazole (PROTONIX) 40 MG tablet Take 1 tablet (40 mg total) by mouth 2 (two) times daily. 06/15/19   Eugenie Filler, MD  simvastatin (ZOCOR) 20 MG tablet Take 1 tablet (20 mg total) by mouth at bedtime. 07/13/18   [name], MD    Physical Exam: Vitals:   06/29/19 1545  BP: (!) 144/87  Pulse: 98  Resp: 18  Temp: 97.6 F (36.4 C)  TempSrc: Oral  SpO2: 99%   Constitutional: Resting supine in bed, NAD, calm,  comfortable Eyes: PERRL, lids and conjunctivae normal ENMT: Mucous membranes are moist. Posterior pharynx clear of any exudate or lesions Neck: normal, supple, no masses. Respiratory: clear to auscultation bilaterally, no wheezing, no crackles. Normal respiratory effort. No accessory muscle use.  Cardiovascular: Regular rate and rhythm, no murmurs / rubs / gallops. No extremity edema. 2+ pedal pulses. Abdomen: no tenderness, no masses palpated. No hepatosplenomegaly. Bowel sounds positive.  GU: Foley catheter in place, large amount of maroon-colored urine in Foley bag Musculoskeletal: no clubbing / cyanosis. No joint deformity upper and lower extremities. Good ROM, no contractures. Normal muscle tone.  Skin: no rashes, lesions, ulcers. No induration Neurologic: CN 2-12 grossly intact. Sensation intact, Strength 5/5 in all 4.  Psychiatric: Alert and oriented x 3. Normal mood.     Labs on Admission: I have personally reviewed following labs and imaging studies  CBC: Recent Labs  Lab 06/29/19 1624  WBC 14.4*  NEUTROABS 12.6*  HGB 9.4*  HCT 30.2*  MCV 87.5  PLT 123XX123*   Basic Metabolic Panel: Recent Labs  Lab 06/29/19 1624  NA  130*  K 4.4  CL 99  CO2 19*  GLUCOSE 93  BUN 44*  CREATININE 4.76*  CALCIUM 8.8*   GFR: CrCl cannot be calculated (Unknown ideal weight.). Liver Function Tests: No results for input(s): AST, ALT, ALKPHOS, BILITOT, PROT, ALBUMIN in the last 168 hours. No results for input(s): LIPASE, AMYLASE in the last 168 hours. No results for input(s): AMMONIA in the last 168 hours. Coagulation Profile: No results for input(s): INR, PROTIME in the last 168 hours. Cardiac Enzymes: No results for input(s): CKTOTAL, CKMB, CKMBINDEX, TROPONINI in the last 168 hours. BNP (last 3 results) No results for input(s): PROBNP in the last 8760 hours. HbA1C: No results for input(s): HGBA1C in the last 72 hours. CBG: No results for input(s): GLUCAP in the last 168  hours. Lipid Profile: No results for input(s): CHOL, HDL, LDLCALC, TRIG, CHOLHDL, LDLDIRECT in the last 72 hours. Thyroid Function Tests: No results for input(s): TSH, T4TOTAL, FREET4, T3FREE, THYROIDAB in the last 72 hours. Anemia Panel: No results for input(s): VITAMINB12, FOLATE, FERRITIN, TIBC, IRON, RETICCTPCT in the last 72 hours. Urine analysis:    Component Value Date/Time   COLORURINE BROWN (A) 06/29/2019 1624   APPEARANCEUR TURBID (A) 06/29/2019 1624   LABSPEC NOT CALCULATED 06/29/2019 1624   PHURINE NOT CALCULATED 06/29/2019 1624   GLUCOSEU (A) 06/29/2019 1624    TEST NOT REPORTED DUE TO COLOR INTERFERENCE OF URINE PIGMENT   HGBUR (A) 06/29/2019 1624    TEST NOT REPORTED DUE TO COLOR INTERFERENCE OF URINE PIGMENT   BILIRUBINUR (A) 06/29/2019 1624    TEST NOT REPORTED DUE TO COLOR INTERFERENCE OF URINE PIGMENT   BILIRUBINUR negative 08/01/2017 1649   KETONESUR (A) 06/29/2019 1624    TEST NOT REPORTED DUE TO COLOR INTERFERENCE OF URINE PIGMENT   PROTEINUR (A) 06/29/2019 1624    TEST NOT REPORTED DUE TO COLOR INTERFERENCE OF URINE PIGMENT   UROBILINOGEN negative (A) 08/01/2017 1649   NITRITE (A) 06/29/2019 1624    TEST NOT REPORTED DUE TO COLOR INTERFERENCE OF URINE PIGMENT   LEUKOCYTESUR (A) 06/29/2019 1624    TEST NOT REPORTED DUE TO COLOR INTERFERENCE OF URINE PIGMENT    Radiological Exams on Admission: No results found.  EKG: Independently reviewed.  Not performed.  Assessment/Plan Principal Problem:   Acute renal failure (ARF) (HCC) Active Problems:   Hyperlipidemia   Essential hypertension   Benign prostatic hyperplasia   Iron deficiency anemia due to chronic blood loss   Chronic indwelling Foley catheter   History of DVT (deep vein thrombosis)   Obstruction of Foley catheter (HCC)  Taher Wilch is a 79 y.o. male with medical history significant for BPH with chronic indwelling Foley, hypertension, hyperlipidemia, PVD, anemia of iron deficiency and  chronic blood loss, history of DVT with IVC filter placed 05/11/2019 (not on anticoagulation due to recent hematuria) who was recently admitted from 05/20/2019-06/15/2019 for acute renal failure secondary to obstructed Foley catheter who is readmitted with acute renal failure due to obstructive uropathy.  Acute renal failure due to obstructed Foley catheter BPH with chronic indwelling Foley catheter: Recent admit for the same.  Catheter was flushed in the ED with large output of maroon-colored urine with some blood clots.  Reportedly seen by urology earlier this week for Foley catheter exchange and started on ciprofloxacin for UTI. -Give additional 1 L normal saline bolus followed by maintenance IV fluids overnight -Continue Foley care, monitor strict I/O's -Obtain renal ultrasound -Follow urine culture, continue oral ciprofloxacin for now -Repeat labs  in a.m.  Hematuria/anemia of chronic blood loss and iron deficiency: Hemoglobin currently stable at 9.4.  Continue to monitor and transfuse as needed.  History of DVT (05/11/2019): Previously on Xarelto, discontinued on last admission due to hematuria.  S/p IVC filter placement 06/11/2019 by vascular surgery.  Hypertension: Stable, continue amlodipine.  Hyperlipidemia: Continue simvastatin.  4 cm infrarenal abdominal aortic aneurysm: Seen on CT imaging 06/09/2019.  Follow-up ultrasound recommended in 1 year.   DVT prophylaxis: SCDs Code Status: Full code, confirmed with patient Family Communication: Discussed with patient, he has discussed with family Disposition Plan: From home, likely discharge to home pending improvement in renal function and hematuria. Consults called: None Admission status: Observation   Zada Finders MD [facility]  If 7PM-7AM, please contact night-coverage [email or web address]  06/29/2019, 7:06 PM

## 2019-06-29 NOTE — Assessment & Plan Note (Signed)
Dizziness is concerning for recurrent anemia as well as weakness. He is having red urine in the catheter this week as well. Needs CBC at urgent care/ER.

## 2019-06-29 NOTE — Progress Notes (Signed)
Attempted to call report, ED RN is unavailable at this time.

## 2019-06-29 NOTE — ED Notes (Signed)
ED TO INPATIENT HANDOFF REPORT  ED Nurse Name and Phone #: jon wled  S Name/Age/Gender Henry Carter 79 y.o. male Room/Bed: WA01/WA01  Code Status   Code Status: Full Code  Home/SNF/Other Home Patient oriented to: self, place, time and situation Is this baseline? Yes   Triage Complete: Triage complete  Chief Complaint Acute renal failure (ARF) ([place]) [N17.9]  Triage Note "My prostate is asking up and I want to pee," the patient states. He reports not having urine in his leg bag the whole day. He also reports pain in his groin.     Allergies Allergies  Allergen Reactions  . Ace Inhibitors     REACTION: angioedema  . Penicillins Hives    Has patient had a PCN reaction causing immediate rash, facial/tongue/throat swelling, SOB or lightheadedness with hypotension: No Has patient had a PCN reaction causing severe rash involving mucus membranes or skin necrosis: No Has patient had a PCN reaction that required hospitalization yes Has patient had a PCN reaction occurring within the last 10 years: no If all of the above answers are "NO", then may proceed with Cephalosporin use.    Level of Care/Admitting Diagnosis ED Disposition    ED Disposition Condition Comment   Admit  Hospital Area: [facility] [id number]  Level of Care: Med-Surg [16]  Covid Evaluation: Asymptomatic Screening Protocol (No Symptoms)  Diagnosis: Acute renal failure (ARF) [facility]) MD:[id number]  Admitting Physician: Lenore Cordia [id number]  Attending Physician: Lenore Cordia [id number]       B Medical/Surgery History Past Medical History:  Diagnosis Date  .  iron deficiency 01/18/2013  . Anemia   . Angioedema   . BPH (benign prostatic hyperplasia)   . COPD (chronic obstructive pulmonary disease) ([place])    STates he has copd  . Essential hypertension   . GERD (gastroesophageal reflux disease)   . Helicobacter pylori gastritis 10/10/2012  . Hyperlipidemia   . IBS (irritable bowel  syndrome)   . Impotence   . Peripheral vascular disease, unspecified ([place])   . Personal history of colonic adenoma 09/22/2012  . Personal history of tobacco use, presenting hazards to health    Past Surgical History:  Procedure Laterality Date  . abdominal surg for ulcers'  1980  . COLONOSCOPY N/A 09/22/2012   Procedure: COLONOSCOPY;  Surgeon: Gatha Mayer, MD;  Location: [facility];  Service: Endoscopy;  Laterality: N/A;  . distal aortogram    . IVC FILTER INSERTION N/A 06/11/2019   Procedure: IVC FILTER INSERTION;  Surgeon: Waynetta Sandy, MD;  Location: [place] [facility];  Service: Cardiovascular;  Laterality: N/A;  . IVC VENOGRAPHY N/A 06/11/2019   Procedure: IVC Venography;  Surgeon: Waynetta Sandy, MD;  Location: [place] [facility];  Service: Cardiovascular;  Laterality: N/A;  . percutaneous transluminal angionplasty with placement of 2 self expanding stent in the distal left superfical femeral artery       A IV Location/Drains/Wounds Patient Lines/Drains/Airways Status   Active Line/Drains/Airways    Name:   Placement date:   Placement time:   Site:   Days:   Peripheral IV 06/29/19 Left Forearm   06/29/19    1843    Forearm   less than 1          Intake/Output Last 24 hours  Intake/Output Summary (Last 24 hours) at 06/29/2019 2113 Last data filed at 06/29/2019 2038 Gross per 24 hour  Intake 1197.63 ml  Output --  Net 1197.63 ml  Labs/Imaging Results for orders placed or performed during the hospital encounter of 06/29/19 (from the past 48 hour(s))  CBC with Differential     Status: Abnormal   Collection Time: 06/29/19  4:24 PM  Result Value Ref Range   WBC 14.4 (H) 4.0 - 10.5 K/uL   RBC 3.45 (L) 4.22 - 5.81 MIL/uL   Hemoglobin 9.4 (L) 13.0 - 17.0 g/dL   HCT 30.2 (L) 39.0 - 52.0 %   MCV 87.5 80.0 - 100.0 fL   MCH 27.2 26.0 - 34.0 pg   MCHC 31.1 30.0 - 36.0 g/dL   RDW 16.8 (H) 11.5 - 15.5 %   Platelets 577 (H) 150 - 400 K/uL   nRBC 0.0  0.0 - 0.2 %   Neutrophils Relative % 88 %   Neutro Abs 12.6 (H) 1.7 - 7.7 K/uL   Lymphocytes Relative 5 %   Lymphs Abs 0.8 0.7 - 4.0 K/uL   Monocytes Relative 6 %   Monocytes Absolute 0.9 0.1 - 1.0 K/uL   Eosinophils Relative 0 %   Eosinophils Absolute 0.0 0.0 - 0.5 K/uL   Basophils Relative 0 %   Basophils Absolute 0.0 0.0 - 0.1 K/uL   Immature Granulocytes 1 %   Abs Immature Granulocytes 0.12 (H) 0.00 - 0.07 K/uL    Comment: Performed at [facility], [place] [address] [place], [place] [id number]  Basic metabolic panel     Status: Abnormal   Collection Time: 06/29/19  4:24 PM  Result Value Ref Range   Sodium 130 (L) 135 - 145 mmol/L   Potassium 4.4 3.5 - 5.1 mmol/L   Chloride 99 98 - 111 mmol/L   CO2 19 (L) 22 - 32 mmol/L   Glucose, Bld 93 70 - 99 mg/dL    Comment: Glucose reference range applies only to samples taken after fasting for at least 8 hours.   BUN 44 (H) 8 - 23 mg/dL   Creatinine, Ser 4.76 (H) 0.61 - 1.24 mg/dL   Calcium 8.8 (L) 8.9 - 10.3 mg/dL   GFR calc non Af Amer 11 (L) >60 mL/min   GFR calc Af Amer 13 (L) >60 mL/min   Anion gap 12 5 - 15    Comment: Performed at [facility], [place] [address] [place], [place] [address]  Urinalysis, Routine w reflex microscopic     Status: Abnormal   Collection Time: 06/29/19  4:24 PM  Result Value Ref Range   Color, Urine BROWN (A) YELLOW    Comment: BIOCHEMICALS MAY BE AFFECTED BY COLOR   APPearance TURBID (A) CLEAR   Specific Gravity, Urine NOT CALCULATED 1.005 - 1.030   pH NOT CALCULATED 5.0 - 8.0   Glucose, UA (A) NEGATIVE mg/dL    TEST NOT REPORTED DUE TO COLOR INTERFERENCE OF URINE PIGMENT   Hgb urine dipstick (A) NEGATIVE    TEST NOT REPORTED DUE TO COLOR INTERFERENCE OF URINE PIGMENT   Bilirubin Urine (A) NEGATIVE    TEST NOT REPORTED DUE TO COLOR INTERFERENCE OF URINE PIGMENT   Ketones, ur (A) NEGATIVE mg/dL    TEST NOT REPORTED DUE TO COLOR INTERFERENCE OF URINE PIGMENT    Protein, ur (A) NEGATIVE mg/dL    TEST NOT REPORTED DUE TO COLOR INTERFERENCE OF URINE PIGMENT   Nitrite (A) NEGATIVE    TEST NOT REPORTED DUE TO COLOR INTERFERENCE OF URINE PIGMENT   Leukocytes,Ua (A) NEGATIVE    TEST NOT REPORTED DUE TO COLOR INTERFERENCE OF URINE PIGMENT  Comment: Performed at [facility], [place] [address] [place], [place] [address]  Urinalysis, Microscopic (reflex)     Status: Abnormal   Collection Time: 06/29/19  4:24 PM  Result Value Ref Range   RBC / HPF 11-20 0 - 5 RBC/hpf   WBC, UA >50 0 - 5 WBC/hpf   Bacteria, UA MANY (A) NONE SEEN   Squamous Epithelial / LPF NONE SEEN 0 - 5   Mucus PRESENT     Comment: Performed at [facility], [place] [address], [place], [place] [address]   US RENAL  Result Date: 06/29/2019 CLINICAL DATA:  Acute renal failure EXAM: RENAL / URINARY TRACT ULTRASOUND COMPLETE COMPARISON:  None. FINDINGS: Right Kidney: Renal measurements: 11.9 x 5.0 x 5.8 cm = volume: 180 mL. Moderate right hydronephrosis. At least 2 small cysts, the largest measuring 1.3 cm in the lower pole. Normal echotexture. Left Kidney: Renal measurements: 13.0 x 5.6 x 6.6 cm = volume: 252 mL. 4.9 cm lower pole cyst. Severe left hydronephrosis. Normal echotexture. Bladder: Irregular bladder wall thickening. Foley catheter is in place and prostate is enlarged. Layering echogenic material posteriorly could reflect blood clot. Other: None. IMPRESSION: Prostate enlargement. Markedly irregular bladder wall and layering material posteriorly, possibly blood clot. Moderate to severe bilateral hydronephrosis. Electronically Signed   By: Rolm Baptise M.D.   On: 06/29/2019 21:01    Pending Labs Unresulted Labs (From admission, onward)    Start     Ordered   06/30/19 0500  CBC  Tomorrow morning,   R     06/29/19 1859   06/30/19 [id number]  Basic metabolic panel  Tomorrow morning,   R     06/29/19 1859   06/29/19 1748  SARS CORONAVIRUS 2 (TAT 6-24 HRS)  Nasopharyngeal Nasopharyngeal Swab  (Tier 3 (TAT 6-24 hrs))  Once,   STAT    Question Answer Comment  Is this test for diagnosis or screening Screening   Symptomatic for COVID-19 as defined by CDC No   Hospitalized for COVID-19 No   Admitted to ICU for COVID-19 No   Previously tested for COVID-19 Yes   Resident in a congregate (group) care setting No   Employed in healthcare setting No      06/29/19 1747   06/29/19 1748  Blood culture (routine x 2)  BLOOD CULTURE X 2,   STAT     06/29/19 1747   06/29/19 1541  Urine culture  ONCE - STAT,   STAT     06/29/19 1541          Vitals/Pain Today's Vitals   06/29/19 1933 06/29/19 1945 06/29/19 2000 06/29/19 2030  BP: 129/82  116/61 132/79  Pulse: 88 83 88   Resp: 18     Temp:      TempSrc:      SpO2: 97% 100% 100%   PainSc:        Isolation Precautions No active isolations  Medications Medications  acetaminophen (TYLENOL) tablet 650 mg (has no administration in time range)    Or  acetaminophen (TYLENOL) suppository 650 mg (has no administration in time range)  ondansetron (ZOFRAN) tablet 4 mg (has no administration in time range)    Or  ondansetron (ZOFRAN) injection 4 mg (has no administration in time range)  sodium chloride 0.9 % bolus 1,000 mL (1,000 mLs Intravenous New Bag/Given 06/29/19 1958)    Followed by  0.9 %  sodium chloride infusion (has no administration in time range)  amLODipine (NORVASC)  tablet 10 mg (has no administration in time range)  pantoprazole (PROTONIX) EC tablet 40 mg (has no administration in time range)  simvastatin (ZOCOR) tablet 20 mg (has no administration in time range)  ciprofloxacin (CIPRO) tablet 250 mg (has no administration in time range)  ciprofloxacin (CIPRO) IVPB 400 mg (0 mg Intravenous Stopped 06/29/19 1952)  sodium chloride 0.9 % bolus 1,000 mL (0 mLs Intravenous Stopped 06/29/19 2038)    Mobility walks Moderate fall risk   Focused Assessments Renal Assessment  Handoff:  Hemodialysis Schedule:    Restricted appendage:      R Recommendations: See Admitting Provider Note  Report given to:   Additional Notes:

## 2019-06-30 DIAGNOSIS — Z87891 Personal history of nicotine dependence: Secondary | ICD-10-CM | POA: Diagnosis not present

## 2019-06-30 DIAGNOSIS — D500 Iron deficiency anemia secondary to blood loss (chronic): Secondary | ICD-10-CM | POA: Diagnosis present

## 2019-06-30 DIAGNOSIS — Z9119 Patient's noncompliance with other medical treatment and regimen: Secondary | ICD-10-CM | POA: Diagnosis not present

## 2019-06-30 DIAGNOSIS — R310 Gross hematuria: Secondary | ICD-10-CM | POA: Diagnosis present

## 2019-06-30 DIAGNOSIS — N400 Enlarged prostate without lower urinary tract symptoms: Secondary | ICD-10-CM | POA: Diagnosis present

## 2019-06-30 DIAGNOSIS — Z79899 Other long term (current) drug therapy: Secondary | ICD-10-CM | POA: Diagnosis not present

## 2019-06-30 DIAGNOSIS — N136 Pyonephrosis: Secondary | ICD-10-CM | POA: Diagnosis present

## 2019-06-30 DIAGNOSIS — Z95828 Presence of other vascular implants and grafts: Secondary | ICD-10-CM | POA: Diagnosis not present

## 2019-06-30 DIAGNOSIS — I10 Essential (primary) hypertension: Secondary | ICD-10-CM

## 2019-06-30 DIAGNOSIS — E785 Hyperlipidemia, unspecified: Secondary | ICD-10-CM | POA: Diagnosis present

## 2019-06-30 DIAGNOSIS — I714 Abdominal aortic aneurysm, without rupture: Secondary | ICD-10-CM | POA: Diagnosis present

## 2019-06-30 DIAGNOSIS — I739 Peripheral vascular disease, unspecified: Secondary | ICD-10-CM | POA: Diagnosis present

## 2019-06-30 DIAGNOSIS — Z20822 Contact with and (suspected) exposure to covid-19: Secondary | ICD-10-CM | POA: Diagnosis present

## 2019-06-30 DIAGNOSIS — N3289 Other specified disorders of bladder: Secondary | ICD-10-CM | POA: Diagnosis present

## 2019-06-30 DIAGNOSIS — Z978 Presence of other specified devices: Secondary | ICD-10-CM | POA: Diagnosis not present

## 2019-06-30 DIAGNOSIS — E872 Acidosis: Secondary | ICD-10-CM | POA: Diagnosis present

## 2019-06-30 DIAGNOSIS — T83091D Other mechanical complication of indwelling urethral catheter, subsequent encounter: Secondary | ICD-10-CM

## 2019-06-30 DIAGNOSIS — N179 Acute kidney failure, unspecified: Secondary | ICD-10-CM | POA: Diagnosis present

## 2019-06-30 DIAGNOSIS — J449 Chronic obstructive pulmonary disease, unspecified: Secondary | ICD-10-CM | POA: Diagnosis present

## 2019-06-30 DIAGNOSIS — T83091A Other mechanical complication of indwelling urethral catheter, initial encounter: Secondary | ICD-10-CM | POA: Diagnosis present

## 2019-06-30 DIAGNOSIS — Z86718 Personal history of other venous thrombosis and embolism: Secondary | ICD-10-CM | POA: Diagnosis not present

## 2019-06-30 DIAGNOSIS — N401 Enlarged prostate with lower urinary tract symptoms: Secondary | ICD-10-CM | POA: Diagnosis not present

## 2019-06-30 DIAGNOSIS — K219 Gastro-esophageal reflux disease without esophagitis: Secondary | ICD-10-CM | POA: Diagnosis present

## 2019-06-30 DIAGNOSIS — Y846 Urinary catheterization as the cause of abnormal reaction of the patient, or of later complication, without mention of misadventure at the time of the procedure: Secondary | ICD-10-CM | POA: Diagnosis present

## 2019-06-30 DIAGNOSIS — K5900 Constipation, unspecified: Secondary | ICD-10-CM | POA: Diagnosis present

## 2019-06-30 LAB — URINE CULTURE: Culture: NO GROWTH

## 2019-06-30 LAB — BASIC METABOLIC PANEL
Anion gap: 8 (ref 5–15)
BUN: 39 mg/dL — ABNORMAL HIGH (ref 8–23)
CO2: 18 mmol/L — ABNORMAL LOW (ref 22–32)
Calcium: 8 mg/dL — ABNORMAL LOW (ref 8.9–10.3)
Chloride: 106 mmol/L (ref 98–111)
Creatinine, Ser: 3.01 mg/dL — ABNORMAL HIGH (ref 0.61–1.24)
GFR calc Af Amer: 22 mL/min — ABNORMAL LOW (ref >60)
GFR calc non Af Amer: 19 mL/min — ABNORMAL LOW (ref >60)
Glucose, Bld: 81 mg/dL (ref 70–99)
Potassium: 4 mmol/L (ref 3.5–5.1)
Sodium: 132 mmol/L — ABNORMAL LOW (ref 135–145)

## 2019-06-30 LAB — CBC
HCT: 25.1 % — ABNORMAL LOW (ref 39.0–52.0)
Hemoglobin: 8 g/dL — ABNORMAL LOW (ref 13.0–17.0)
MCH: 27.9 pg (ref 26.0–34.0)
MCHC: 31.9 g/dL (ref 30.0–36.0)
MCV: 87.5 fL (ref 80.0–100.0)
Platelets: 433 10*3/uL — ABNORMAL HIGH (ref 150–400)
RBC: 2.87 MIL/uL — ABNORMAL LOW (ref 4.22–5.81)
RDW: 16.7 % — ABNORMAL HIGH (ref 11.5–15.5)
WBC: 11.1 10*3/uL — ABNORMAL HIGH (ref 4.0–10.5)
nRBC: 0 % (ref 0.0–0.2)

## 2019-06-30 LAB — SARS CORONAVIRUS 2 (TAT 6-24 HRS): SARS Coronavirus 2: NEGATIVE

## 2019-06-30 MED ORDER — HYDROCODONE-ACETAMINOPHEN 5-325 MG PO TABS
1.0000 | ORAL_TABLET | Freq: Four times a day (QID) | ORAL | Status: DC | PRN
  Administered 2019-06-30 – 2019-07-06 (×9): 1 via ORAL
  Filled 2019-06-30 (×9): qty 1

## 2019-06-30 MED ORDER — FINASTERIDE 5 MG PO TABS
5.0000 mg | ORAL_TABLET | Freq: Every day | ORAL | Status: DC
  Administered 2019-06-30 – 2019-07-06 (×7): 5 mg via ORAL
  Filled 2019-06-30 (×7): qty 1

## 2019-06-30 MED ORDER — SENNOSIDES-DOCUSATE SODIUM 8.6-50 MG PO TABS
1.0000 | ORAL_TABLET | Freq: Every day | ORAL | Status: DC
  Administered 2019-06-30 – 2019-07-05 (×6): 1 via ORAL
  Filled 2019-06-30 (×6): qty 1

## 2019-06-30 MED ORDER — CHLORHEXIDINE GLUCONATE CLOTH 2 % EX PADS
6.0000 | MEDICATED_PAD | Freq: Every day | CUTANEOUS | Status: DC
  Administered 2019-06-30 – 2019-07-06 (×7): 6 via TOPICAL

## 2019-06-30 MED ORDER — TRAZODONE HCL 50 MG PO TABS
25.0000 mg | ORAL_TABLET | Freq: Every evening | ORAL | Status: DC | PRN
  Administered 2019-06-30 – 2019-07-04 (×4): 25 mg via ORAL
  Filled 2019-06-30 (×5): qty 1

## 2019-06-30 MED ORDER — POLYETHYLENE GLYCOL 3350 17 G PO PACK
17.0000 g | PACK | Freq: Every day | ORAL | Status: DC
  Administered 2019-06-30 – 2019-07-05 (×2): 17 g via ORAL
  Filled 2019-06-30 (×7): qty 1

## 2019-06-30 MED ORDER — SODIUM CHLORIDE 0.9 % IV SOLN: INTRAVENOUS | Status: DC

## 2019-06-30 NOTE — Consult Note (Signed)
Urology Consult  Consulting MD: Zada Finders, MD  CC: Gross hematuria  HPI: This is a 79year old male with recent history of urinary retention.  He has had an indwelling Foley catheter since the fall 2020.  He has this changed fairly regularly, both in the office and in the hospital where he presents with gross hematuria fairly often.  The patient was last seen in our office on the 16th of this month.  At that time, his urine was clear and he was scheduled for catheter change in 2 weeks.  The patient has a history of DVT and has been on Xarelto in the past.  He has recently had an IVC filter placed and is off of Xarelto.  He was recently admitted for a bladder that is not draining appropriately through his catheter.  He presented to the emergency room yesterday with this.  He has gross hematuria although his urine is currently draining without difficulty.  At the time of presentation his creatinine was 4.76.  With proper bladder drainage his creatinine is now 3.0.  He is having occasional bladder spasms although history taking is somewhat difficult with him.  The patient has a history of elevated PSA but has not presented as requested for biopsy.  PMH: Past Medical History:  Diagnosis Date  .  iron deficiency 01/18/2013  . Anemia   . Angioedema   . BPH (benign prostatic hyperplasia)   . COPD (chronic obstructive pulmonary disease) ([place])    STates he has copd  . Essential hypertension   . GERD (gastroesophageal reflux disease)   . Helicobacter pylori gastritis 10/10/2012  . Hyperlipidemia   . IBS (irritable bowel syndrome)   . Impotence   . Peripheral vascular disease, unspecified ([place])   . Personal history of colonic adenoma 09/22/2012  . Personal history of tobacco use, presenting hazards to health     PSH: Past Surgical History:  Procedure Laterality Date  . abdominal surg for ulcers'  1980  . COLONOSCOPY N/A 09/22/2012   Procedure: COLONOSCOPY;  Surgeon: Gatha Mayer, MD;   Location: [facility];  Service: Endoscopy;  Laterality: N/A;  . distal aortogram    . IVC FILTER INSERTION N/A 06/11/2019   Procedure: IVC FILTER INSERTION;  Surgeon: Waynetta Sandy, MD;  Location: [facility];  Service: Cardiovascular;  Laterality: N/A;  . IVC VENOGRAPHY N/A 06/11/2019   Procedure: IVC Venography;  Surgeon: Waynetta Sandy, MD;  Location: [facility];  Service: Cardiovascular;  Laterality: N/A;  . percutaneous transluminal angionplasty with placement of 2 self expanding stent in the distal left superfical femeral artery      Allergies: Allergies  Allergen Reactions  . Ace Inhibitors     REACTION: angioedema  . Penicillins Hives    Has patient had a PCN reaction causing immediate rash, facial/tongue/throat swelling, SOB or lightheadedness with hypotension: No Has patient had a PCN reaction causing severe rash involving mucus membranes or skin necrosis: No Has patient had a PCN reaction that required hospitalization yes Has patient had a PCN reaction occurring within the last 10 years: no If all of the above answers are "NO", then may proceed with Cephalosporin use.    Medications: Medications Prior to Admission  Medication Sig Dispense Refill Last Dose  . amLODipine (NORVASC) 10 MG tablet Take 1 tablet (10 mg total) by mouth daily. 30 tablet 1 06/28/2019 at Unknown time  . ciprofloxacin (CIPRO) 250 MG tablet Take 250 mg by mouth 2 (two) times  daily. Starting 06/27/2019 for 7 days.   06/28/2019 at Unknown time  . pantoprazole (PROTONIX) 40 MG tablet Take 1 tablet (40 mg total) by mouth 2 (two) times daily. 60 tablet 1 06/28/2019 at Unknown time  . simvastatin (ZOCOR) 20 MG tablet Take 1 tablet (20 mg total) by mouth at bedtime. 90 tablet 3 06/28/2019 at Unknown time     Social History: Social History   Socioeconomic History  . Marital status: Married    Spouse name: Not on file  . Number of children: 4  . Years of education: Not on  file  . Highest education level: Not on file  Occupational History  . Not on file  Tobacco Use  . Smoking status: Former Smoker    Packs/day: 0.25    Years: 50.00    Pack years: 12.50    Types: Cigarettes    Start date: 04/12/1957    Quit date: 04/12/2014    Years since quitting: 5.2  . Smokeless tobacco: Never Used  . Tobacco comment: using chatix; stopped now; took x 1 week / had stopped 3 years prior to starting back in 2005  Substance and Sexual Activity  . Alcohol use: No    Alcohol/week: 0.0 standard drinks    Comment: OCC  . Drug use: Yes    Comment: States marijuanna relaxes him;   . Sexual activity: Not Currently    Comment: marijuanna  Other Topics Concern  . 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 Difficulty of Paying Living Expenses:   Food Insecurity:   . Worried About Charity fundraiser in the Last Year:   . Arboriculturist in the Last Year:   Transportation Needs:   . Film/video editor (Medical):   Marland Kitchen Lack of Transportation (Non-Medical):   Physical Activity:   . Days of Exercise per Week:   . Minutes of Exercise per Session:   Stress:   . Feeling of Stress :   Social Connections:   . Frequency of Communication with Friends and Family:   . Frequency of Social Gatherings with Friends and Family:   . Attends Religious Services:   . Active Member of Clubs or Organizations:   . Attends Archivist Meetings:   Marland Kitchen Marital Status:   Intimate Partner Violence:   . Fear of Current or Ex-Partner:   . Emotionally Abused:   Marland Kitchen Physically Abused:   . Sexually Abused:     Family History: Family History  Problem Relation Age of Onset  . Alcohol abuse Father   . Kidney disease Brother   . Drug abuse Brother   . Diabetes Brother   . Diabetes Mother   .  Diabetes Brother   . Colon cancer Neg Hx     Review of Systems: Positive: Urinary retention, gross hematuria, improper catheter drainage, penile pain Negative:   A further 10 point review of systems was negative except what is listed in the HPI.  Physical Exam: @VITALS2 @ General: No acute distress.  Awake.  Very thin patient. Head:  Normocephalic.  Atraumatic. ENT:  EOMI.  Mucous membranes moist Neck:  Supple.  No lymphadenopathy. CV:  Regular rate. Pulmonary: Equal effort bilaterally.   Abdomen: Scaphoid Skin:  Normal turgor.  No visible rash. Extremity:  No gross deformity of upper extremities.  No gross deformity of lower extremities. Neurologic: Alert. Appropriate mood.  Penis:  Uncircumcised.  No lesions.  Catheter present at meatus Urethra: Orthotopic meatus. Scrotum: No lesions.  No ecchymosis.  No erythema.   Studies:  Recent Labs    06/29/19 1624 06/30/19 0533  HGB 9.4* 8.0*  WBC 14.4* 11.1*  PLT 577* 433*    Recent Labs    06/29/19 1624 06/30/19 0533  NA 130* 132*  K 4.4 4.0  CL 99 106  CO2 19* 18*  BUN 44* 39*  CREATININE 4.76* 3.01*  CALCIUM 8.8* 8.0*  GFRNONAA 11* 19*  GFRAA 13* 22*     No results for input(s): INR, APTT in the last 72 hours.  Invalid input(s): PT   Invalid input(s): ABG  His catheter is draining properly.  It was secured to his right leg.  Urine very light pink without clots  Assessment: 1.  Gross hematuria, most likely from catheter related trauma which is longstanding.  He does have a large prostate and a lot of this bleeding may be from prostate in nature.  2.  Elevated PSA.  Ultrasound and biopsy has been scheduled but the patient has not been compliant with following up as scheduled.  This will eventually need to be done  Plan: 1.  I secured the patient's catheter.  I have recommended that this be irrigated every 6-8 hours with saline.  2.  At this point, he does not have to have his catheter changed as it seems to  be draining well.  3.  I will start him on finasteride.  This may decrease the amount of bleeding he has.  The question is whether he will be compliant with this medication taken long-term    Pager:[phone]

## 2019-06-30 NOTE — Progress Notes (Signed)
PROGRESS NOTE  Henry Carter [id number] DOB: 1941-04-04 DOA: 06/29/2019 PCP: [name], MD  HPI/Recap of past 24 hours: HPI from Dr Marin Roberts Boniface is a 79 y.o. male with medical history significant for BPH with chronic indwelling Foley, hypertension, hyperlipidemia, PVD, anemia of iron deficiency and chronic blood loss, history of DVT with IVC filter placed (not on anticoagulation due to recent hematuria) who was recently admitted from 05/20/2019-06/15/2019 for acute renal failure secondary to obstructed Foley catheter. He was also noted to have anemia due to hematuria and was transfused 2 units PRBCs.  His Xarelto was discontinued and an IVC filter was placed on 06/11/2019.  Urine culture from 06/09/2019 grew corynebacterium species diphtheroids which was felt to be a skin contaminant. Patient followed up with urology as an outpatient and had his Foley catheter exchanged, subsequently started on oral ciprofloxacin for presumed UTI, which patient stated he took 1 dose PTA. Patient reported decreased amount of urine which was noted to be bloody with some suprapubic tenderness. In the ED, VSS, labs showed WBC 14.4, hemoglobin 9.4, bicarb 19, BUN 44, creatinine 4.76. UA showed 21-50 RBC, greater than 50 WBCs, few bacteria. Patient's catheter was flushed and the hospitalist service was consulted for further evaluation and management.     Today, patient reports feeling uncomfortable, reports suprapubic tenderness, also feeling constipated, denies any chest pain, shortness of breath, fever/chills, N/V.    Assessment/Plan: Principal Problem:   Acute renal failure (ARF) (HCC) Active Problems:   Hyperlipidemia   Essential hypertension   Benign prostatic hyperplasia   Iron deficiency anemia due to chronic blood loss   Chronic indwelling Foley catheter   History of DVT (deep vein thrombosis)   Obstruction of Foley catheter (H[facility])   AKI (acute kidney injury) ([place])   Acute  renal failure/metabolic acidosis likely 2/2 obstructed Foley catheter Gross hematuria AKI improving, after Foley was flushed UA could not be analyzed due to significant hematuria, UC showed no growth Renal ultrasound showed prostate enlargement, markedly irregular bladder wall and layering material posteriorly possibly blood clot.  Moderate to severe bilateral hydronephrosis Urology consulted, Foley irrigated every 6-8 hours with saline, start finasteride Continue ciprofloxacin Continue IV fluids, Foley care  Anemia of chronic blood loss/iron deficiency Hemoglobin stable at baseline Anemia panel pending Type and screen, transfuse if hemoglobin less than 7 Daily CBC  BPH with chronic indwelling Foley History of elevated PSA Ultrasound and biopsy has been scheduled, patient has not been compliant Urology as an outpatient Start finasteride  Hypertension Continue home amlodipine  Hyperlipidemia Continue statins  History of DVT  Diagnosed on 05/11/2019, status post IVC filter placement on 06/11/2019 Previously on Xarelto, discontinued due to gross hematuria  4 cm infrarenal abdominal aortic aneurysm Seen on CT 06/09/2019 Follow-up ultrasound in 1 year       Malnutrition Type:      Malnutrition Characteristics:      Nutrition Interventions:       Estimated body mass index is 19.19 kg/m as calculated from the following:   Height as of this encounter: 5\' 11"  (1.803 m).   Weight as of this encounter: 62.4 kg.     Code Status: Full  Family Communication: Discussed extensively with patient  Disposition Plan: Plan for d/c after significant renal improvement, urology signing off, PT/OT   Consultants:  Urology  Procedures:  None  Antimicrobials:  Ciprofloxacin  DVT prophylaxis: SCDs   Objective: Vitals:   06/30/19 0500 06/30/19 0527 06/30/19 0942 06/30/19 1436  BP:  138/78  124/84  Pulse:  89  92  Resp:    20  Temp:  98.6 F (37 C)  99 F (37.2  C)  TempSrc:    Oral  SpO2:  100%  100%  Weight: 62.4 kg     Height:   5\' 11"  (1.803 m)     Intake/Output Summary (Last 24 hours) at 06/30/2019 1524 Last data filed at 06/30/2019 1500 Gross per 24 hour  Intake 1903.41 ml  Output 2025 ml  Net -121.59 ml   Filed Weights   06/30/19 0500  Weight: 62.4 kg    Exam:  General: NAD, chronically ill-appearing  Cardiovascular: S1, S2 present  Respiratory: CTAB  Abdomen: Soft, nontender, nondistended, bowel sounds present  Musculoskeletal: No bilateral pedal edema noted  Skin: Normal  Psychiatry: Normal mood   Data Reviewed: CBC: Recent Labs  Lab 06/29/19 1624 06/30/19 0533  WBC 14.4* 11.1*  NEUTROABS 12.6*  --   HGB 9.4* 8.0*  HCT 30.2* 25.1*  MCV 87.5 87.5  PLT 577* [id number]*   Basic Metabolic Panel: Recent Labs  Lab 06/29/19 1624 06/30/19 0533  NA 130* 132*  K 4.4 4.0  CL 99 106  CO2 19* 18*  GLUCOSE 93 81  BUN 44* 39*  CREATININE 4.76* 3.01*  CALCIUM 8.8* 8.0*   GFR: Estimated Creatinine Clearance: 17.9 mL/min (A) (by C-G formula based on SCr of 3.01 mg/dL (H)). Liver Function Tests: No results for input(s): AST, ALT, ALKPHOS, BILITOT, PROT, ALBUMIN in the last 168 hours. No results for input(s): LIPASE, AMYLASE in the last 168 hours. No results for input(s): AMMONIA in the last 168 hours. Coagulation Profile: No results for input(s): INR, PROTIME in the last 168 hours. Cardiac Enzymes: No results for input(s): CKTOTAL, CKMB, CKMBINDEX, TROPONINI in the last 168 hours. BNP (last 3 results) No results for input(s): PROBNP in the last 8760 hours. HbA1C: No results for input(s): HGBA1C in the last 72 hours. CBG: No results for input(s): GLUCAP in the last 168 hours. Lipid Profile: No results for input(s): CHOL, HDL, LDLCALC, TRIG, CHOLHDL, LDLDIRECT in the last 72 hours. Thyroid Function Tests: No results for input(s): TSH, T4TOTAL, FREET4, T3FREE, THYROIDAB in the last 72 hours. Anemia Panel: No  results for input(s): VITAMINB12, FOLATE, FERRITIN, TIBC, IRON, RETICCTPCT in the last 72 hours. Urine analysis:    Component Value Date/Time   COLORURINE BROWN (A) 06/29/2019 1624   APPEARANCEUR TURBID (A) 06/29/2019 1624   LABSPEC NOT CALCULATED 06/29/2019 1624   PHURINE NOT CALCULATED 06/29/2019 1624   GLUCOSEU (A) 06/29/2019 1624    TEST NOT REPORTED DUE TO COLOR INTERFERENCE OF URINE PIGMENT   HGBUR (A) 06/29/2019 1624    TEST NOT REPORTED DUE TO COLOR INTERFERENCE OF URINE PIGMENT   BILIRUBINUR (A) 06/29/2019 1624    TEST NOT REPORTED DUE TO COLOR INTERFERENCE OF URINE PIGMENT   BILIRUBINUR negative 08/01/2017 1649   KETONESUR (A) 06/29/2019 1624    TEST NOT REPORTED DUE TO COLOR INTERFERENCE OF URINE PIGMENT   PROTEINUR (A) 06/29/2019 1624    TEST NOT REPORTED DUE TO COLOR INTERFERENCE OF URINE PIGMENT   UROBILINOGEN negative (A) 08/01/2017 1649   NITRITE (A) 06/29/2019 1624    TEST NOT REPORTED DUE TO COLOR INTERFERENCE OF URINE PIGMENT   LEUKOCYTESUR (A) 06/29/2019 1624    TEST NOT REPORTED DUE TO COLOR INTERFERENCE OF URINE PIGMENT   Sepsis Labs: @LABRCNTIP (procalcitonin:4,lacticidven:4)  ) Recent Results (from the past 240 hour(s))  Urine culture  Status: None   Collection Time: 06/29/19  4:24 PM   Specimen: Urine  Result Value Ref Range Status   Specimen Description   Final    Urine Performed at [facility] [address], [place], [place] [address]    Special Requests   Final    NONE Performed at [facility], [place] [address] [place], [place] [address]    Culture   Final    NO GROWTH Performed at [facility], [place] [address] [place], [place] [address]    Report Status 06/30/2019 FINAL  Final  SARS CORONAVIRUS 2 (TAT 6-24 HRS) Nasopharyngeal Nasopharyngeal Swab     Status: None   Collection Time: 06/29/19  6:45 PM   Specimen: Nasopharyngeal Swab  Result Value Ref Range Status   SARS Coronavirus 2 NEGATIVE  NEGATIVE Final    Comment: (NOTE) SARS-CoV-2 target nucleic acids are NOT DETECTED. The SARS-CoV-2 RNA is generally detectable in upper and lower respiratory specimens during the acute phase of infection. Negative results do not preclude SARS-CoV-2 infection, do not rule out co-infections with other pathogens, and should not be used as the sole basis for treatment or other patient management decisions. Negative results must be combined with clinical observations, patient history, and epidemiological information. The expected result is Negative. Fact Sheet for Patients: SugarRoll.be Fact Sheet for Healthcare Providers: https://www.woods-mathews.com/ This test is not yet approved or cleared by the [place] FDA and  has been authorized for detection and/or diagnosis of SARS-CoV-2 by FDA under an Emergency Use Authorization (EUA). This EUA will remain  in effect (meaning this test can be used) for the duration of the COVID-19 declaration under Section 56 4(b)(1) of the Act, 21 U.S.C. section 360bbb-3(b)(1), unless the authorization is terminated or revoked sooner. Performed at [facility], [place] [address], [place], [place] [address]   Blood culture (routine x 2)     Status: None (Preliminary result)   Collection Time: 06/29/19  6:45 PM   Specimen: Right Antecubital; Blood  Result Value Ref Range Status   Specimen Description   Final    RIGHT ANTECUBITAL Performed at [facility] [address] [place], [place] [address]    Special Requests   Final    BOTTLES DRAWN AEROBIC AND ANAEROBIC Blood Culture adequate volume Performed at [facility] [address], [place], [place] [address]    Culture   Final    NO GROWTH < 12 HOURS Performed at [facility] [address] [place], [place] [address]    Report Status PENDING  Incomplete  Blood culture (routine x 2)     Status: None  (Preliminary result)   Collection Time: 06/29/19  6:45 PM   Specimen: BLOOD LEFT FOREARM  Result Value Ref Range Status   Specimen Description   Final    BLOOD LEFT FOREARM Performed at [facility] [address] [place], [place] [address]    Special Requests   Final    BOTTLES DRAWN AEROBIC AND ANAEROBIC Blood Culture adequate volume Performed at [facility] [address] [place], [place] [address]    Culture   Final    NO GROWTH < 12 HOURS Performed at [facility] [address] [place],  [address]    Report Status PENDING  Incomplete      Studies: US RENAL  Result Date: 06/29/2019 CLINICAL DATA:  Acute renal failure EXAM: RENAL /  URINARY TRACT ULTRASOUND COMPLETE COMPARISON:  None. FINDINGS: Right Kidney: Renal measurements: 11.9 x 5.0 x 5.8 cm = volume: 180 mL. Moderate right hydronephrosis. At least 2 small cysts, the largest measuring 1.3 cm in the lower pole. Normal echotexture. Left Kidney: Renal measurements: 13.0 x 5.6 x 6.6 cm = volume: 252 mL. 4.9 cm lower pole cyst. Severe left hydronephrosis. Normal echotexture. Bladder: Irregular bladder wall thickening. Foley catheter is in place and prostate is enlarged. Layering echogenic material posteriorly could reflect blood clot. Other: None. IMPRESSION: Prostate enlargement. Markedly irregular bladder wall and layering material posteriorly, possibly blood clot. Moderate to severe bilateral hydronephrosis. Electronically Signed   By: Rolm Baptise M.D.   On: 06/29/2019 21:01    Scheduled Meds: . amLODipine  10 mg Oral Daily  . Chlorhexidine Gluconate Cloth  6 each Topical Daily  . ciprofloxacin  250 mg Oral BID  . finasteride  5 mg Oral Daily  . pantoprazole  40 mg Oral BID  . polyethylene glycol  17 g Oral Daily  . senna-docusate  1 tablet Oral QHS  . simvastatin  20 mg Oral QHS    Continuous Infusions: . sodium chloride 75 mL/hr at 06/30/19 1159     LOS: 0  days     Alma Friendly, MD [facility]  If 7PM-7AM, please contact night-coverage [email or web address] 06/30/2019, 3:24 PM

## 2019-06-30 NOTE — TOC Initial Note (Signed)
Transition of Care [facility]) - Initial/Assessment Note    Patient Details  Name: Henry Carter MRN: [id number] Date of Birth: [birth year]  [facility] CM/SW Contact:    Trish Mage, LCSW Phone Number: 06/30/2019, 11:53 AM  Clinical Narrative:  CSW responding to consult re: unknown [facility] provider.  Chart review revelas patient is receiving [facility] RN services from [facility]. TOC will continue to follow during the course of hospitalization.                 Expected Discharge Plan: [place] Barriers to Discharge: No Barriers Identified   Patient Goals and CMS Choice        Expected Discharge Plan and Services Expected Discharge Plan: [place]                                              Prior Living Arrangements/Services                       Activities of Daily Living Home Assistive Devices/Equipment: Other (Comment)(indwelling urinary catheter) ADL Screening (condition at time of admission) Patient's cognitive ability adequate to safely complete daily activities?: Yes Is the patient deaf or have difficulty hearing?: No Does the patient have difficulty seeing, even when wearing glasses/contacts?: No Does the patient have difficulty concentrating, remembering, or making decisions?: No Patient able to express need for assistance with ADLs?: Yes Does the patient have difficulty dressing or bathing?: No Independently performs ADLs?: Yes (appropriate for developmental age) Does the patient have difficulty walking or climbing stairs?: No Weakness of Legs: None Weakness of Arms/Hands: None  Permission Sought/Granted                  Emotional Assessment              Admission diagnosis:  Gross hematuria [R31.0] Acute renal failure (ARF) ([place]) [N17.9] Obstruction of Foley catheter, initial encounter ([place]) [T83.091A] Acute renal failure, unspecified acute renal failure type ([place]) [N17.9] AKI (acute  kidney injury) ([place]) [N17.9] Patient Active Problem List   Diagnosis Date Noted  . AKI (acute kidney injury) ([place]) 06/30/2019  . Chronic indwelling Foley catheter 06/29/2019  . History of DVT (deep vein thrombosis) 06/29/2019  . Obstruction of Foley catheter ([place]) 06/29/2019  . Acute bilateral obstructive uropathy   . Hypokalemia   . Acute metabolic encephalopathy   . Hyponatremia   . Hematuria 06/09/2019  . Acute renal failure (ARF) ([place]) 06/09/2019  . Acute blood loss anemia 06/09/2019  . Hyperkalemia 06/09/2019  . Metabolic acidosis 99991111  . Left leg DVT ([place]) 05/18/2019  . Left leg swelling 05/11/2019  . Pre-operative cardiovascular examination 03/22/2019  . 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 Right arm weakness 07/21/2017  . Non compliance w medication regimen 07/30/2016  . 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 Mild cognitive impairment 12/11/2015  . 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 Healthcare maintenance 02/07/2013  . Iron deficiency anemia due to chronic blood loss 01/18/2013  . C O P D 04/08/2010  . Atherosclerosis of native artery of extremity with intermittent claudication ([place]) 11/26/2008  . Benign prostatic hyperplasia 10/20/2007  . PERIPHERAL VASCULAR DISEASE 05/19/2007  . Essential hypertension 02/01/2007  . Hyperlipidemia 01/03/2007   PCP:  [name], MD Pharmacy:   [facility] [place] [address] Phone: [phone] Fax: [phone]     Social Determinants of Health (SDOH) Interventions    Readmission Risk Interventions No flowsheet data found.

## 2019-07-01 LAB — CBC WITH DIFFERENTIAL/PLATELET
Abs Immature Granulocytes: 0.08 10*3/uL — ABNORMAL HIGH (ref 0.00–0.07)
Basophils Absolute: 0 10*3/uL (ref 0.0–0.1)
Basophils Relative: 0 %
Eosinophils Absolute: 0.1 10*3/uL (ref 0.0–0.5)
Eosinophils Relative: 1 %
HCT: 26 % — ABNORMAL LOW (ref 39.0–52.0)
Hemoglobin: 8 g/dL — ABNORMAL LOW (ref 13.0–17.0)
Immature Granulocytes: 1 %
Lymphocytes Relative: 5 %
Lymphs Abs: 0.5 10*3/uL — ABNORMAL LOW (ref 0.7–4.0)
MCH: 26.8 pg (ref 26.0–34.0)
MCHC: 30.8 g/dL (ref 30.0–36.0)
MCV: 87.2 fL (ref 80.0–100.0)
Monocytes Absolute: 0.5 10*3/uL (ref 0.1–1.0)
Monocytes Relative: 6 %
Neutro Abs: 7.7 10*3/uL (ref 1.7–7.7)
Neutrophils Relative %: 87 %
Platelets: 447 10*3/uL — ABNORMAL HIGH (ref 150–400)
RBC: 2.98 MIL/uL — ABNORMAL LOW (ref 4.22–5.81)
RDW: 16.5 % — ABNORMAL HIGH (ref 11.5–15.5)
WBC: 8.8 10*3/uL (ref 4.0–10.5)
nRBC: 0 % (ref 0.0–0.2)

## 2019-07-01 LAB — BASIC METABOLIC PANEL
Anion gap: 7 (ref 5–15)
BUN: 34 mg/dL — ABNORMAL HIGH (ref 8–23)
CO2: 19 mmol/L — ABNORMAL LOW (ref 22–32)
Calcium: 8.4 mg/dL — ABNORMAL LOW (ref 8.9–10.3)
Chloride: 108 mmol/L (ref 98–111)
Creatinine, Ser: 2.81 mg/dL — ABNORMAL HIGH (ref 0.61–1.24)
GFR calc Af Amer: 24 mL/min — ABNORMAL LOW (ref >60)
GFR calc non Af Amer: 21 mL/min — ABNORMAL LOW (ref >60)
Glucose, Bld: 80 mg/dL (ref 70–99)
Potassium: 4.2 mmol/L (ref 3.5–5.1)
Sodium: 134 mmol/L — ABNORMAL LOW (ref 135–145)

## 2019-07-01 LAB — TYPE AND SCREEN
ABO/RH(D): O POS
Antibody Screen: NEGATIVE

## 2019-07-01 LAB — IRON AND TIBC
Iron: 12 ug/dL — ABNORMAL LOW (ref 45–182)
Saturation Ratios: 4 % — ABNORMAL LOW (ref 17.9–39.5)
TIBC: 272 ug/dL (ref 250–450)
UIBC: 260 ug/dL

## 2019-07-01 LAB — VITAMIN B12: Vitamin B-12: 291 pg/mL (ref 180–914)

## 2019-07-01 LAB — FOLATE: Folate: 10.2 ng/mL (ref >5.9)

## 2019-07-01 LAB — FERRITIN: Ferritin: 77 ng/mL (ref 24–336)

## 2019-07-01 MED ORDER — SODIUM CHLORIDE 0.9 % IV SOLN
510.0000 mg | Freq: Once | INTRAVENOUS | Status: AC
  Administered 2019-07-01: 510 mg via INTRAVENOUS
  Filled 2019-07-01: qty 510

## 2019-07-01 MED ORDER — FERROUS SULFATE 325 (65 FE) MG PO TABS
325.0000 mg | ORAL_TABLET | Freq: Every day | ORAL | Status: DC
  Administered 2019-07-02 – 2019-07-06 (×5): 325 mg via ORAL
  Filled 2019-07-01 (×6): qty 1

## 2019-07-01 MED ORDER — VITAMIN B-12 1000 MCG PO TABS
1000.0000 ug | ORAL_TABLET | Freq: Every day | ORAL | Status: DC
  Administered 2019-07-01 – 2019-07-06 (×6): 1000 ug via ORAL
  Filled 2019-07-01 (×6): qty 1

## 2019-07-01 NOTE — Progress Notes (Signed)
PROGRESS NOTE  Jourdain Vielman [id number] DOB: 04-Nov-1940 DOA: 06/29/2019 PCP: [name], MD  HPI/Recap of past 24 hours: HPI from Dr Marin Roberts Matthes is a 79 y.o. male with medical history significant for BPH with chronic indwelling Foley, hypertension, hyperlipidemia, PVD, anemia of iron deficiency and chronic blood loss, history of DVT with IVC filter placed (not on anticoagulation due to recent hematuria) who was recently admitted from 05/20/2019-06/15/2019 for acute renal failure secondary to obstructed Foley catheter. He was also noted to have anemia due to hematuria and was transfused 2 units PRBCs.  His Xarelto was discontinued and an IVC filter was placed on 06/11/2019.  Urine culture from 06/09/2019 grew corynebacterium species diphtheroids which was felt to be a skin contaminant. Patient followed up with urology as an outpatient and had his Foley catheter exchanged, subsequently started on oral ciprofloxacin for presumed UTI, which patient stated he took 1 dose PTA. Patient reported decreased amount of urine which was noted to be bloody with some suprapubic tenderness. In the ED, VSS, labs showed WBC 14.4, hemoglobin 9.4, bicarb 19, BUN 44, creatinine 4.76. UA showed 21-50 RBC, greater than 50 WBCs, few bacteria. Patient's catheter was flushed and the hospitalist service was consulted for further evaluation and management.     Today, patient denies any new complaints.  Reports generalized weakness.  Foley still draining pinkish urine.    Assessment/Plan: Principal Problem:   Acute renal failure (ARF) (HCC) Active Problems:   Hyperlipidemia   Essential hypertension   Benign prostatic hyperplasia   Iron deficiency anemia due to chronic blood loss   Chronic indwelling Foley catheter   History of DVT (deep vein thrombosis)   Obstruction of Foley catheter (HCC)   AKI (acute kidney injury) ([facility])   Acute renal failure/metabolic acidosis likely 2/2 obstructed Foley  catheter Gross hematuria AKI improving, after Foley irrigation UA could not be analyzed due to significant hematuria, UC showed no growth Renal ultrasound showed prostate enlargement, markedly irregular bladder wall and layering material posteriorly possibly blood clot.  Moderate to severe bilateral hydronephrosis Urology consulted, Foley irrigated every 6-8 hours with saline, start finasteride Continue ciprofloxacin Continue IV fluids, Foley care  Anemia of chronic blood loss/iron deficiency Hemoglobin stable at baseline Anemia panel showed iron 12, sats 4, ferritin 77, folate 10.2, vitamin B12, 291 Type and screen, transfuse if hemoglobin less than 7 Start Feraheme x1 dose, oral and vitamin B12 supplements Daily CBC  BPH with chronic indwelling Foley History of elevated PSA Ultrasound and biopsy has been scheduled, patient has not been compliant Urology on board Start finasteride  Hypertension Continue home amlodipine  Hyperlipidemia Continue statins  History of DVT  Diagnosed on 05/11/2019, status post IVC filter placement on 06/11/2019 Previously on Xarelto, discontinued due to gross hematuria  4 cm infrarenal abdominal aortic aneurysm Seen on CT 06/09/2019 Follow-up ultrasound in 1 year       Malnutrition Type:      Malnutrition Characteristics:      Nutrition Interventions:       Estimated body mass index is 18.88 kg/m as calculated from the following:   Height as of this encounter: 5\' 11"  (1.803 m).   Weight as of this encounter: 61.4 kg.     Code Status: Full  Family Communication: Discussed extensively with wife on 07/01/19  Disposition Plan: Plan for d/c after significant renal improvement, urology signing off, PT/OT   Consultants:  Urology  Procedures:  None  Antimicrobials:  Ciprofloxacin  DVT prophylaxis: SCDs  Objective: Vitals:   06/30/19 1436 06/30/19 2043 07/01/19 0532 07/01/19 1009  BP: 124/84 (!) 141/84 (!) 150/75  (!) 147/76  Pulse: 92 100 93   Resp: 20 16 16    Temp: 99 F (37.2 C) 98.8 F (37.1 C) 98.6 F (37 C)   TempSrc: Oral Oral Oral   SpO2: 100% 100% 100%   Weight:   61.4 kg   Height:        Intake/Output Summary (Last 24 hours) at 07/01/2019 1316 Last data filed at 07/01/2019 0900 Gross per 24 hour  Intake 465.78 ml  Output 1200 ml  Net -734.22 ml   Filed Weights   06/30/19 0500 07/01/19 0532  Weight: 62.4 kg 61.4 kg    Exam:  General: NAD, chronically ill-appearing  Cardiovascular: S1, S2 present  Respiratory: CTAB  Abdomen: Soft, nontender, nondistended, bowel sounds present  Musculoskeletal: No bilateral pedal edema noted  Skin: Normal  Psychiatry: Normal mood   Data Reviewed: CBC: Recent Labs  Lab 06/29/19 1624 06/30/19 0533 07/01/19 0617  WBC 14.4* 11.1* 8.8  NEUTROABS 12.6*  --  7.7  HGB 9.4* 8.0* 8.0*  HCT 30.2* 25.1* 26.0*  MCV 87.5 87.5 87.2  PLT 577* 433* 99991111*   Basic Metabolic Panel: Recent Labs  Lab 06/29/19 1624 06/30/19 0533 07/01/19 0617  NA 130* 132* 134*  K 4.4 4.0 4.2  CL 99 106 108  CO2 19* 18* 19*  GLUCOSE 93 81 80  BUN 44* 39* 34*  CREATININE 4.76* 3.01* 2.81*  CALCIUM 8.8* 8.0* 8.4*   GFR: Estimated Creatinine Clearance: 18.8 mL/min (A) (by C-G formula based on SCr of 2.81 mg/dL (H)). Liver Function Tests: No results for input(s): AST, ALT, ALKPHOS, BILITOT, PROT, ALBUMIN in the last 168 hours. No results for input(s): LIPASE, AMYLASE in the last 168 hours. No results for input(s): AMMONIA in the last 168 hours. Coagulation Profile: No results for input(s): INR, PROTIME in the last 168 hours. Cardiac Enzymes: No results for input(s): CKTOTAL, CKMB, CKMBINDEX, TROPONINI in the last 168 hours. BNP (last 3 results) No results for input(s): PROBNP in the last 8760 hours. HbA1C: No results for input(s): HGBA1C in the last 72 hours. CBG: No results for input(s): GLUCAP in the last 168 hours. Lipid Profile: No results  for input(s): CHOL, HDL, LDLCALC, TRIG, CHOLHDL, LDLDIRECT in the last 72 hours. Thyroid Function Tests: No results for input(s): TSH, T4TOTAL, FREET4, T3FREE, THYROIDAB in the last 72 hours. Anemia Panel: Recent Labs    07/01/19 0617  VITAMINB12 291  FOLATE 10.2  FERRITIN 77  TIBC 272  IRON 12*   Urine analysis:    Component Value Date/Time   COLORURINE BROWN (A) 06/29/2019 1624   APPEARANCEUR TURBID (A) 06/29/2019 1624   LABSPEC NOT CALCULATED 06/29/2019 1624   PHURINE NOT CALCULATED 06/29/2019 1624   GLUCOSEU (A) 06/29/2019 1624    TEST NOT REPORTED DUE TO COLOR INTERFERENCE OF URINE PIGMENT   HGBUR (A) 06/29/2019 1624    TEST NOT REPORTED DUE TO COLOR INTERFERENCE OF URINE PIGMENT   BILIRUBINUR (A) 06/29/2019 1624    TEST NOT REPORTED DUE TO COLOR INTERFERENCE OF URINE PIGMENT   BILIRUBINUR negative 08/01/2017 1649   KETONESUR (A) 06/29/2019 1624    TEST NOT REPORTED DUE TO COLOR INTERFERENCE OF URINE PIGMENT   PROTEINUR (A) 06/29/2019 1624    TEST NOT REPORTED DUE TO COLOR INTERFERENCE OF URINE PIGMENT   UROBILINOGEN negative (A) 08/01/2017 1649   NITRITE (A) 06/29/2019 1624  TEST NOT REPORTED DUE TO COLOR INTERFERENCE OF URINE PIGMENT   LEUKOCYTESUR (A) 06/29/2019 1624    TEST NOT REPORTED DUE TO COLOR INTERFERENCE OF URINE PIGMENT   Sepsis Labs: @LABRCNTIP (procalcitonin:4,lacticidven:4)  ) Recent Results (from the past 240 hour(s))  Urine culture     Status: None   Collection Time: 06/29/19  4:24 PM   Specimen: Urine  Result Value Ref Range Status   Specimen Description   Final    Urine Performed at [facility], [place] [address], [place], [place] [address]    Special Requests   Final    NONE Performed at [facility], [place] [address], [place], [place] [address]    Culture   Final    NO GROWTH Performed at [facility], [place] [address] [place], [place] [address]    Report Status 06/30/2019 FINAL  Final  SARS  CORONAVIRUS 2 (TAT 6-24 HRS) Nasopharyngeal Nasopharyngeal Swab     Status: None   Collection Time: 06/29/19  6:45 PM   Specimen: Nasopharyngeal Swab  Result Value Ref Range Status   SARS Coronavirus 2 NEGATIVE NEGATIVE Final    Comment: (NOTE) SARS-CoV-2 target nucleic acids are NOT DETECTED. The SARS-CoV-2 RNA is generally detectable in upper and lower respiratory specimens during the acute phase of infection. Negative results do not preclude SARS-CoV-2 infection, do not rule out co-infections with other pathogens, and should not be used as the sole basis for treatment or other patient management decisions. Negative results must be combined with clinical observations, patient history, and epidemiological information. The expected result is Negative. Fact Sheet for Patients: SugarRoll.be Fact Sheet for Healthcare Providers: https://www.woods-mathews.com/ This test is not yet approved or cleared by the [place] FDA and  has been authorized for detection and/or diagnosis of SARS-CoV-2 by FDA under an Emergency Use Authorization (EUA). This EUA will remain  in effect (meaning this test can be used) for the duration of the COVID-19 declaration under Section 56 4(b)(1) of the Act, 21 U.S.C. section 360bbb-3(b)(1), unless the authorization is terminated or revoked sooner. Performed at [facility], [place] [address] [place], [place] [address]   Blood culture (routine x 2)     Status: None (Preliminary result)   Collection Time: 06/29/19  6:45 PM   Specimen: Right Antecubital; Blood  Result Value Ref Range Status   Specimen Description   Final    RIGHT ANTECUBITAL Performed at [place] [address], [place], [place] [address]    Special Requests   Final    BOTTLES DRAWN AEROBIC AND ANAEROBIC Blood Culture adequate volume Performed at [place] [address] [place], [place] [address]     Culture   Final    NO GROWTH 2 DAYS Performed at [place] [address] [place], [place] [address]    Report Status PENDING  Incomplete  Blood culture (routine x 2)     Status: None (Preliminary result)   Collection Time: 06/29/19  6:45 PM   Specimen: BLOOD LEFT FOREARM  Result Value Ref Range Status   Specimen Description   Final    BLOOD LEFT FOREARM Performed at [place] [address], [place], [place] [address]    Special Requests   Final    BOTTLES DRAWN AEROBIC AND ANAEROBIC Blood Culture adequate volume Performed at [place] [address], [place],  [address]    Culture   Final  NO GROWTH 2 DAYS Performed at [facility], [place] [address] [place], [place] [address]    Report Status PENDING  Incomplete      Studies: No results found.  Scheduled Meds: . amLODipine  10 mg Oral Daily  . Chlorhexidine Gluconate Cloth  6 each Topical Daily  . ciprofloxacin  250 mg Oral BID  . finasteride  5 mg Oral Daily  . pantoprazole  40 mg Oral BID  . polyethylene glycol  17 g Oral Daily  . senna-docusate  1 tablet Oral QHS  . simvastatin  20 mg Oral QHS    Continuous Infusions: . sodium chloride 75 mL/hr at 06/30/19 1600     LOS: 1 day     Alma Friendly, MD [facility]  If 7PM-7AM, please contact night-coverage [email or web address] 07/01/2019, 1:16 PM

## 2019-07-01 NOTE — Progress Notes (Signed)
.    Vital Signs MEWS/VS Documentation      07/01/2019 0900 07/01/2019 1009 07/01/2019 1409 07/01/2019 1416   MEWS Score:  0  0  2  2   MEWS Score Color:  Green  Green  Yellow  Yellow   Resp:  --  --  20  --   Pulse:  --  --  (!) 111  (!) 113   BP:  --  (!) 147/76  (!) 163/90  --   Temp:  --  --  99.7 F (37.6 C)  --   O2 Device:  --  --  Room Air  --   Level of Consciousness:  Alert  --  --  --    Patient in yellow MEWS d/t elevated pulse. Yellow MEWS protocol initiated. Attending MD notified, EKG ordered. Will continue to monitor.     Sheyanne Munley P Nautia Lem 07/01/2019,3:41 PM

## 2019-07-01 NOTE — Progress Notes (Signed)
Subjective: Patient reports that he still has a little bit of bladder pain.  Sounds like bladder spasms to me.  Nurses appropriately irrigating the catheter, with some clots liberated.  Objective: Vital signs in last 24 hours: Temp:  [98.6 F (37 C)-99 F (37.2 C)] 98.6 F (37 C) (03/21 0532) Pulse Rate:  [92-100] 93 (03/21 0532) Resp:  [16-20] 16 (03/21 0532) BP: (124-150)/(75-84) 150/75 (03/21 0532) SpO2:  [100 %] 100 % (03/21 0532) Weight:  [61.4 kg] 61.4 kg (03/21 0532)  Intake/Output from previous day: 03/20 0701 - 03/21 0700 In: 465.8 [P.O.:240; I.V.:225.8] Out: 1400 [Urine:1400] Intake/Output this shift: No intake/output data recorded.  Physical Exam:  Constitutional: Vital signs reviewed. WD WN in NAD   Eyes: PERRL, No scleral icterus.   Cardiovascular: RRR Pulmonary/Chest: Normal effort Extremities: No cyanosis or edema   Lab Results: Recent Labs    06/29/19 1624 06/30/19 0533 07/01/19 0617  HGB 9.4* 8.0* 8.0*  HCT 30.2* 25.1* 26.0*   BMET Recent Labs    06/30/19 0533 07/01/19 0617  NA 132* 134*  K 4.0 4.2  CL 106 108  CO2 18* 19*  GLUCOSE 81 80  BUN 39* 34*  CREATININE 3.01* 2.81*  CALCIUM 8.0* 8.4*   No results for input(s): LABPT, INR in the last 72 hours. No results for input(s): LABURIN in the last 72 hours. Results for orders placed or performed during the hospital encounter of 06/29/19  Urine culture     Status: None   Collection Time: 06/29/19  4:24 PM   Specimen: Urine  Result Value Ref Range Status   Specimen Description   Final    Urine Performed at [place] [address], [place], [place] [address]    Special Requests   Final    NONE Performed at [facility], [place] [address], [place], [place] [address]    Culture   Final    NO GROWTH Performed at [facility], [place] [address], [place], [place] [address]    Report Status 06/30/2019 FINAL  Final  SARS CORONAVIRUS 2 (TAT 6-24  HRS) Nasopharyngeal Nasopharyngeal Swab     Status: None   Collection Time: 06/29/19  6:45 PM   Specimen: Nasopharyngeal Swab  Result Value Ref Range Status   SARS Coronavirus 2 NEGATIVE NEGATIVE Final    Comment: (NOTE) SARS-CoV-2 target nucleic acids are NOT DETECTED. The SARS-CoV-2 RNA is generally detectable in upper and lower respiratory specimens during the acute phase of infection. Negative results do not preclude SARS-CoV-2 infection, do not rule out co-infections with other pathogens, and should not be used as the sole basis for treatment or other patient management decisions. Negative results must be combined with clinical observations, patient history, and epidemiological information. The expected result is Negative. Fact Sheet for Patients: SugarRoll.be Fact Sheet for Healthcare Providers: https://www.woods-mathews.com/ This test is not yet approved or cleared by the [place] FDA and  has been authorized for detection and/or diagnosis of SARS-CoV-2 by FDA under an Emergency Use Authorization (EUA). This EUA will remain  in effect (meaning this test can be used) for the duration of the COVID-19 declaration under Section 56 4(b)(1) of the Act, 21 U.S.C. section 360bbb-3(b)(1), unless the authorization is terminated or revoked sooner. Performed at [facility], [place] [address] [place], [place] [address]   Blood culture (routine x 2)     Status: None (Preliminary result)   Collection Time: 06/29/19  6:45 PM   Specimen:  Right Antecubital; Blood  Result Value Ref Range Status   Specimen Description   Final    RIGHT ANTECUBITAL Performed at [place] [address], [place], [place] [address]    Special Requests   Final    BOTTLES DRAWN AEROBIC AND ANAEROBIC Blood Culture adequate volume Performed at [place] [address] [place], [place] [address]    Culture   Final     NO GROWTH 2 DAYS Performed at [place] [place] [address], [place], [place] [address]    Report Status PENDING  Incomplete  Blood culture (routine x 2)     Status: None (Preliminary result)   Collection Time: 06/29/19  6:45 PM   Specimen: BLOOD LEFT FOREARM  Result Value Ref Range Status   Specimen Description   Final    BLOOD LEFT FOREARM Performed at [facility] [address] [place], [place] [address]    Special Requests   Final    BOTTLES DRAWN AEROBIC AND ANAEROBIC Blood Culture adequate volume Performed at [place] [address], [place], [place] [address]    Culture   Final    NO GROWTH 2 DAYS Performed at [facility] [address] [place], [place] [address]    Report Status PENDING  Incomplete    Studies/Results: US RENAL  Result Date: 06/29/2019 CLINICAL DATA:  Acute renal failure EXAM: RENAL / URINARY TRACT ULTRASOUND COMPLETE COMPARISON:  None. FINDINGS: Right Kidney: Renal measurements: 11.9 x 5.0 x 5.8 cm = volume: 180 mL. Moderate right hydronephrosis. At least 2 small cysts, the largest measuring 1.3 cm in the lower pole. Normal echotexture. Left Kidney: Renal measurements: 13.0 x 5.6 x 6.6 cm = volume: 252 mL. 4.9 cm lower pole cyst. Severe left hydronephrosis. Normal echotexture. Bladder: Irregular bladder wall thickening. Foley catheter is in place and prostate is enlarged. Layering echogenic material posteriorly could reflect blood clot. Other: None. IMPRESSION: Prostate enlargement. Markedly irregular bladder wall and layering material posteriorly, possibly blood clot. Moderate to severe bilateral hydronephrosis. Electronically Signed   By: Rolm Baptise M.D.   On: 06/29/2019 21:01   Bladder irrigated with 500 cc of saline.  No clots produced.  Irrigant clear.  Assessment/Plan:   Continued urinary retention.  Patient has catheter in place    Gross hematuria, clearing, clots appropriately  irrigated.  I started him on finasteride which will limit bleeding from the prostate.    Elevated PSA, in need of eventual biopsy by Dr. Alyson Ingles    If felt to be okay from medicine standpoint, okay for discharge at any point.  If he still here tomorrow I will have Dr. Alyson Ingles follow-up   LOS: 1 day   Jorja Loa 07/01/2019, 7:33 AM

## 2019-07-02 LAB — CBC WITH DIFFERENTIAL/PLATELET
Abs Immature Granulocytes: 0.11 10*3/uL — ABNORMAL HIGH (ref 0.00–0.07)
Basophils Absolute: 0 10*3/uL (ref 0.0–0.1)
Basophils Relative: 0 %
Eosinophils Absolute: 0 10*3/uL (ref 0.0–0.5)
Eosinophils Relative: 0 %
HCT: 24.1 % — ABNORMAL LOW (ref 39.0–52.0)
Hemoglobin: 7.5 g/dL — ABNORMAL LOW (ref 13.0–17.0)
Immature Granulocytes: 1 %
Lymphocytes Relative: 6 %
Lymphs Abs: 0.7 10*3/uL (ref 0.7–4.0)
MCH: 27 pg (ref 26.0–34.0)
MCHC: 31.1 g/dL (ref 30.0–36.0)
MCV: 86.7 fL (ref 80.0–100.0)
Monocytes Absolute: 0.8 10*3/uL (ref 0.1–1.0)
Monocytes Relative: 8 %
Neutro Abs: 9.4 10*3/uL — ABNORMAL HIGH (ref 1.7–7.7)
Neutrophils Relative %: 85 %
Platelets: 399 10*3/uL (ref 150–400)
RBC: 2.78 MIL/uL — ABNORMAL LOW (ref 4.22–5.81)
RDW: 16.9 % — ABNORMAL HIGH (ref 11.5–15.5)
WBC: 11.1 10*3/uL — ABNORMAL HIGH (ref 4.0–10.5)
nRBC: 0 % (ref 0.0–0.2)

## 2019-07-02 LAB — BASIC METABOLIC PANEL
Anion gap: 9 (ref 5–15)
BUN: 33 mg/dL — ABNORMAL HIGH (ref 8–23)
CO2: 17 mmol/L — ABNORMAL LOW (ref 22–32)
Calcium: 8.4 mg/dL — ABNORMAL LOW (ref 8.9–10.3)
Chloride: 110 mmol/L (ref 98–111)
Creatinine, Ser: 2.51 mg/dL — ABNORMAL HIGH (ref 0.61–1.24)
GFR calc Af Amer: 27 mL/min — ABNORMAL LOW (ref >60)
GFR calc non Af Amer: 24 mL/min — ABNORMAL LOW (ref >60)
Glucose, Bld: 72 mg/dL (ref 70–99)
Potassium: 4.3 mmol/L (ref 3.5–5.1)
Sodium: 136 mmol/L (ref 135–145)

## 2019-07-02 LAB — ABO/RH: ABO/RH(D): O POS

## 2019-07-02 LAB — CBC
HCT: 25.7 % — ABNORMAL LOW (ref 39.0–52.0)
Hemoglobin: 8 g/dL — ABNORMAL LOW (ref 13.0–17.0)
MCH: 26.8 pg (ref 26.0–34.0)
MCHC: 31.1 g/dL (ref 30.0–36.0)
MCV: 86.2 fL (ref 80.0–100.0)
Platelets: 424 10*3/uL — ABNORMAL HIGH (ref 150–400)
RBC: 2.98 MIL/uL — ABNORMAL LOW (ref 4.22–5.81)
RDW: 16.8 % — ABNORMAL HIGH (ref 11.5–15.5)
WBC: 10.3 10*3/uL (ref 4.0–10.5)
nRBC: 0 % (ref 0.0–0.2)

## 2019-07-02 MED ORDER — LIP MEDEX EX OINT
TOPICAL_OINTMENT | CUTANEOUS | Status: DC | PRN
  Filled 2019-07-02: qty 7

## 2019-07-02 NOTE — Progress Notes (Signed)
PROGRESS NOTE  Henry Carter [id number] DOB: 06/07/1940 DOA: 06/29/2019 PCP: [name], MD  HPI/Recap of past 24 hours: HPI from Dr Marin Roberts Mencias is a 79 y.o. male with medical history significant for BPH with chronic indwelling Foley, hypertension, hyperlipidemia, PVD, anemia of iron deficiency and chronic blood loss, history of DVT with IVC filter placed (not on anticoagulation due to recent hematuria) who was recently admitted from 05/20/2019-06/15/2019 for acute renal failure secondary to obstructed Foley catheter. He was also noted to have anemia due to hematuria and was transfused 2 units PRBCs.  His Xarelto was discontinued and an IVC filter was placed on 06/11/2019.  Urine culture from 06/09/2019 grew corynebacterium species diphtheroids which was felt to be a skin contaminant. Patient followed up with urology as an outpatient and had his Foley catheter exchanged, subsequently started on oral ciprofloxacin for presumed UTI, which patient stated he took 1 dose PTA. Patient reported decreased amount of urine which was noted to be bloody with some suprapubic tenderness. In the ED, VSS, labs showed WBC 14.4, hemoglobin 9.4, bicarb 19, BUN 44, creatinine 4.76. UA showed 21-50 RBC, greater than 50 WBCs, few bacteria. Patient's catheter was flushed and the hospitalist service was consulted for further evaluation and management.     Today, patient denies any new complaints, still noted to have some suprapubic tenderness. Noted to have clots while irrigating foley this am, still draining pinkish urine. Reports generalized weakness.    Assessment/Plan: Principal Problem:   Acute renal failure (ARF) (HCC) Active Problems:   Hyperlipidemia   Essential hypertension   Benign prostatic hyperplasia   Iron deficiency anemia due to chronic blood loss   Chronic indwelling Foley catheter   History of DVT (deep vein thrombosis)   Obstruction of Foley catheter (HCC)   AKI (acute  kidney injury) ([place])   Acute renal failure/metabolic acidosis likely 2/2 obstructed Foley catheter Gross hematuria AKI slowing improving, after Foley irrigation UA could not be analyzed due to significant hematuria, UC showed no growth Renal ultrasound showed prostate enlargement, markedly irregular bladder wall and layering material posteriorly possibly blood clot.  Moderate to severe bilateral hydronephrosis Urology consulted, Foley irrigated every 6-8 hours with saline, start finasteride Continue ciprofloxacin Continue IV fluids, Foley care  Anemia of chronic blood loss/iron deficiency Hemoglobin slowly trending downwards Anemia panel showed iron 12, sats 4, ferritin 77, folate 10.2, vitamin B12, 291 Type and screen, transfuse if hemoglobin less than 7 Feraheme x1 dose on 07/01/19, start oral and vitamin B12 supplements Daily CBC  BPH with chronic indwelling Foley History of elevated PSA Ultrasound and biopsy has been scheduled as outpt Urology on board Start finasteride  Hypertension Continue home amlodipine  Hyperlipidemia Continue statins  History of DVT  Diagnosed on 05/11/2019, status post IVC filter placement on 06/11/2019 Previously on Xarelto, discontinued due to gross hematuria  4 cm infrarenal abdominal aortic aneurysm Seen on CT 06/09/2019 Follow-up ultrasound in 1 year       Malnutrition Type:      Malnutrition Characteristics:      Nutrition Interventions:       Estimated body mass index is 19.05 kg/m as calculated from the following:   Height as of this encounter: 5\' 10"  (1.778 m).   Weight as of this encounter: 60.2 kg.     Code Status: Full  Family Communication: Discussed extensively with wife on 07/01/19  Disposition Plan: Plan for d/c after significant renal improvement, urology signing off, PT/OT   Consultants:  Urology  Procedures:  None  Antimicrobials:  Ciprofloxacin  DVT prophylaxis: SCDs   Objective: Vitals:    07/02/19 0415 07/02/19 0950 07/02/19 1009 07/02/19 1300  BP: (!) 147/93  132/88 129/82  Pulse: 94   (!) 101  Resp: 16     Temp: 98.9 F (37.2 C)   98.7 F (37.1 C)  TempSrc: Oral   Oral  SpO2: 100%   100%  Weight: 61.5 kg 60.2 kg    Height:  5\' 10"  (1.778 m)      Intake/Output Summary (Last 24 hours) at 07/02/2019 1655 Last data filed at 07/02/2019 1516 Gross per 24 hour  Intake 4197.55 ml  Output 5300 ml  Net -1102.45 ml   Filed Weights   07/01/19 0532 07/02/19 0415 07/02/19 0950  Weight: 61.4 kg 61.5 kg 60.2 kg    Exam:  General: NAD, chronically ill-appearing  Cardiovascular: S1, S2 present  Respiratory: CTAB  Abdomen: Soft, nontender, nondistended, bowel sounds present  Musculoskeletal: No bilateral pedal edema noted  Skin: Normal  Psychiatry: Normal mood   Data Reviewed: CBC: Recent Labs  Lab 06/29/19 1624 06/30/19 0533 07/01/19 0617 07/02/19 0533  WBC 14.4* 11.1* 8.8 11.1*  NEUTROABS 12.6*  --  7.7 9.4*  HGB 9.4* 8.0* 8.0* 7.5*  HCT 30.2* 25.1* 26.0* 24.1*  MCV 87.5 87.5 87.2 86.7  PLT 577* 433* 447* [id number]   Basic Metabolic Panel: Recent Labs  Lab 06/29/19 1624 06/30/19 0533 07/01/19 0617 07/02/19 0533  NA 130* 132* 134* 136  K 4.4 4.0 4.2 4.3  CL 99 106 108 110  CO2 19* 18* 19* 17*  GLUCOSE 93 81 80 72  BUN 44* 39* 34* 33*  CREATININE 4.76* 3.01* 2.81* 2.51*  CALCIUM 8.8* 8.0* 8.4* 8.4*   GFR: Estimated Creatinine Clearance: 20.7 mL/min (A) (by C-G formula based on SCr of 2.51 mg/dL (H)). Liver Function Tests: No results for input(s): AST, ALT, ALKPHOS, BILITOT, PROT, ALBUMIN in the last 168 hours. No results for input(s): LIPASE, AMYLASE in the last 168 hours. No results for input(s): AMMONIA in the last 168 hours. Coagulation Profile: No results for input(s): INR, PROTIME in the last 168 hours. Cardiac Enzymes: No results for input(s): CKTOTAL, CKMB, CKMBINDEX, TROPONINI in the last 168 hours. BNP (last 3 results) No results  for input(s): PROBNP in the last 8760 hours. HbA1C: No results for input(s): HGBA1C in the last 72 hours. CBG: No results for input(s): GLUCAP in the last 168 hours. Lipid Profile: No results for input(s): CHOL, HDL, LDLCALC, TRIG, CHOLHDL, LDLDIRECT in the last 72 hours. Thyroid Function Tests: No results for input(s): TSH, T4TOTAL, FREET4, T3FREE, THYROIDAB in the last 72 hours. Anemia Panel: Recent Labs    07/01/19 0617  VITAMINB12 291  FOLATE 10.2  FERRITIN 77  TIBC 272  IRON 12*   Urine analysis:    Component Value Date/Time   COLORURINE BROWN (A) 06/29/2019 1624   APPEARANCEUR TURBID (A) 06/29/2019 1624   LABSPEC NOT CALCULATED 06/29/2019 1624   PHURINE NOT CALCULATED 06/29/2019 1624   GLUCOSEU (A) 06/29/2019 1624    TEST NOT REPORTED DUE TO COLOR INTERFERENCE OF URINE PIGMENT   HGBUR (A) 06/29/2019 1624    TEST NOT REPORTED DUE TO COLOR INTERFERENCE OF URINE PIGMENT   BILIRUBINUR (A) 06/29/2019 1624    TEST NOT REPORTED DUE TO COLOR INTERFERENCE OF URINE PIGMENT   BILIRUBINUR negative 08/01/2017 1649   KETONESUR (A) 06/29/2019 1624    TEST NOT REPORTED DUE TO COLOR INTERFERENCE OF URINE PIGMENT  PROTEINUR (A) 06/29/2019 1624    TEST NOT REPORTED DUE TO COLOR INTERFERENCE OF URINE PIGMENT   UROBILINOGEN negative (A) 08/01/2017 1649   NITRITE (A) 06/29/2019 1624    TEST NOT REPORTED DUE TO COLOR INTERFERENCE OF URINE PIGMENT   LEUKOCYTESUR (A) 06/29/2019 1624    TEST NOT REPORTED DUE TO COLOR INTERFERENCE OF URINE PIGMENT   Sepsis Labs: @LABRCNTIP (procalcitonin:4,lacticidven:4)  ) Recent Results (from the past 240 hour(s))  Urine culture     Status: None   Collection Time: 06/29/19  4:24 PM   Specimen: Urine  Result Value Ref Range Status   Specimen Description   Final    Urine Performed at [facility], [place] [address] [place], [place] [address]    Special Requests   Final    NONE Performed at [facility], [place]  [address], [place], [place] [address]    Culture   Final    NO GROWTH Performed at [facility], [place] [address] [place], [place] [address]    Report Status 06/30/2019 FINAL  Final  SARS CORONAVIRUS 2 (TAT 6-24 HRS) Nasopharyngeal Nasopharyngeal Swab     Status: None   Collection Time: 06/29/19  6:45 PM   Specimen: Nasopharyngeal Swab  Result Value Ref Range Status   SARS Coronavirus 2 NEGATIVE NEGATIVE Final    Comment: (NOTE) SARS-CoV-2 target nucleic acids are NOT DETECTED. The SARS-CoV-2 RNA is generally detectable in upper and lower respiratory specimens during the acute phase of infection. Negative results do not preclude SARS-CoV-2 infection, do not rule out co-infections with other pathogens, and should not be used as the sole basis for treatment or other patient management decisions. Negative results must be combined with clinical observations, patient history, and epidemiological information. The expected result is Negative. Fact Sheet for Patients: SugarRoll.be Fact Sheet for Healthcare Providers: https://www.woods-mathews.com/ This test is not yet approved or cleared by the [place] FDA and  has been authorized for detection and/or diagnosis of SARS-CoV-2 by FDA under an Emergency Use Authorization (EUA). This EUA will remain  in effect (meaning this test can be used) for the duration of the COVID-19 declaration under Section 56 4(b)(1) of the Act, 21 U.S.C. section 360bbb-3(b)(1), unless the authorization is terminated or revoked sooner. Performed at [facility], [place] [address] [place], [place] [address]   Blood culture (routine x 2)     Status: None (Preliminary result)   Collection Time: 06/29/19  6:45 PM   Specimen: Right Antecubital; Blood  Result Value Ref Range Status   Specimen Description   Final    RIGHT ANTECUBITAL Performed at [place] [address] [place], [place]  [address]    Special Requests   Final    BOTTLES DRAWN AEROBIC AND ANAEROBIC Blood Culture adequate volume Performed at [place] [address] [place], [place] [address]    Culture   Final    NO GROWTH 3 DAYS Performed at [facility], [place] [address] [place],  [address]    Report Status PENDING  Incomplete  Blood culture (routine x 2)     Status: None (Preliminary result)   Collection Time: 06/29/19  6:45 PM   Specimen: BLOOD LEFT FOREARM  Result Value Ref Range Status   Specimen Description   Final    BLOOD LEFT FOREARM Performed at [place] [address], [place],  [address]    Special Requests  Final    BOTTLES DRAWN AEROBIC AND ANAEROBIC Blood Culture adequate volume Performed at [place] [address], [place], [place] [address]    Culture   Final    NO GROWTH 3 DAYS Performed at [facility], [place] [address], [place], [place] [address]    Report Status PENDING  Incomplete      Studies: No results found.  Scheduled Meds: . amLODipine  10 mg Oral Daily  . Chlorhexidine Gluconate Cloth  6 each Topical Daily  . ciprofloxacin  250 mg Oral BID  . ferrous sulfate  325 mg Oral Q breakfast  . finasteride  5 mg Oral Daily  . pantoprazole  40 mg Oral BID  . polyethylene glycol  17 g Oral Daily  . senna-docusate  1 tablet Oral QHS  . simvastatin  20 mg Oral QHS  . vitamin B-12  1,000 mcg Oral Daily    Continuous Infusions: . sodium chloride 75 mL/hr at 07/02/19 [id number]     LOS: 2 days     Alma Friendly, MD [facility]  If 7PM-7AM, please contact night-coverage [email or web address] 07/02/2019, 4:55 PM

## 2019-07-02 NOTE — Progress Notes (Signed)
Patient ID: Henry Carter, male   DOB: 06/01/1940, 79 y.o.   MRN: [id number]  CTSP for catheter not draining and not being able to irrigate.    A 76fr straight foley was placed after a betadine prep and urethral lubrication and seemed to go easily with no pain on balloon inflation but the catheter would not drain and irrigant flowed out around the foley.  The catheter was removed and was replaced with an 70fr coude which did go into the bladder and began draining turbid urine.   The was irrigated with return of some thick purulent urine but then irrigation was unsuccessful.  I felt that the issue was from bladder spasms and I placed the catheter to drainage and several hundred ml of urine drained.   I believe the prior 16 foley was probable coiled in the urethra or prostatic fossa and the initial 35fr did the same.

## 2019-07-02 NOTE — Progress Notes (Signed)
Foley irrigated at 2200 per order, took several tries to get return in tubing. 300cc cloudy, pink, "thick" appearing urine eventually drained into bag. 3-4 small blood clots noted. Irrigated foley again at [facility] as pt called and said it was not draining. One large blood clot was evacuated from the catheter followed by cloudy yellow urine. Hortencia Conradi RN

## 2019-07-03 LAB — CBC WITH DIFFERENTIAL/PLATELET
Abs Immature Granulocytes: 0.14 10*3/uL — ABNORMAL HIGH (ref 0.00–0.07)
Basophils Absolute: 0 10*3/uL (ref 0.0–0.1)
Basophils Relative: 0 %
Eosinophils Absolute: 0.1 10*3/uL (ref 0.0–0.5)
Eosinophils Relative: 1 %
HCT: 24.4 % — ABNORMAL LOW (ref 39.0–52.0)
Hemoglobin: 7.7 g/dL — ABNORMAL LOW (ref 13.0–17.0)
Immature Granulocytes: 1 %
Lymphocytes Relative: 6 %
Lymphs Abs: 0.7 10*3/uL (ref 0.7–4.0)
MCH: 27.1 pg (ref 26.0–34.0)
MCHC: 31.6 g/dL (ref 30.0–36.0)
MCV: 85.9 fL (ref 80.0–100.0)
Monocytes Absolute: 0.6 10*3/uL (ref 0.1–1.0)
Monocytes Relative: 6 %
Neutro Abs: 9.1 10*3/uL — ABNORMAL HIGH (ref 1.7–7.7)
Neutrophils Relative %: 86 %
Platelets: 391 10*3/uL (ref 150–400)
RBC: 2.84 MIL/uL — ABNORMAL LOW (ref 4.22–5.81)
RDW: 16.7 % — ABNORMAL HIGH (ref 11.5–15.5)
WBC: 10.7 10*3/uL — ABNORMAL HIGH (ref 4.0–10.5)
nRBC: 0 % (ref 0.0–0.2)

## 2019-07-03 LAB — BASIC METABOLIC PANEL
Anion gap: 6 (ref 5–15)
BUN: 21 mg/dL (ref 8–23)
CO2: 20 mmol/L — ABNORMAL LOW (ref 22–32)
Calcium: 8.3 mg/dL — ABNORMAL LOW (ref 8.9–10.3)
Chloride: 106 mmol/L (ref 98–111)
Creatinine, Ser: 1.69 mg/dL — ABNORMAL HIGH (ref 0.61–1.24)
GFR calc Af Amer: 44 mL/min — ABNORMAL LOW (ref >60)
GFR calc non Af Amer: 38 mL/min — ABNORMAL LOW (ref >60)
Glucose, Bld: 95 mg/dL (ref 70–99)
Potassium: 4 mmol/L (ref 3.5–5.1)
Sodium: 132 mmol/L — ABNORMAL LOW (ref 135–145)

## 2019-07-03 NOTE — Progress Notes (Signed)
We are following this patient because he has an enlarged prostate and urinary retention.  This is managed with a chronic indwelling Foley.  He often has hematuria.  Overnight his catheter was exchanged by Dr. Jeffie Pollock, and now his catheter is draining clear urine.  Patient is having minimal spasm.   Subjective: The patient is lying in bed comfortably, he has no complaints currently. Objective: Vital signs in last 24 hours: Temp:  [97.7 F (36.5 C)-98.6 F (37 C)] 97.7 F (36.5 C) (03/23 1229) Pulse Rate:  [83-91] 83 (03/23 1229) Resp:  [16-20] 20 (03/23 1229) BP: (130-150)/(80-93) 150/93 (03/23 1229) SpO2:  [98 %-100 %] 98 % (03/23 1229) Weight:  [61.9 kg] 61.9 kg (03/23 0527)  Intake/Output from previous day: 03/22 0701 - 03/23 0700 In: 1734 [P.O.:1534] Out: 4925 [Urine:4925] Intake/Output this shift: Total I/O In: 650 [P.O.:600; Other:50] Out: [id number] [Urine:1850]  Physical Exam:  Constitutional: Vital signs reviewed. WD WN in NAD   Eyes: PERRL, No scleral icterus.   Cardiovascular: RRR Pulmonary/Chest: Normal effort Extremities: No cyanosis or edema   Lab Results: Recent Labs    07/02/19 0533 07/02/19 1724 07/03/19 0511  HGB 7.5* 8.0* 7.7*  HCT 24.1* 25.7* 24.4*   BMET Recent Labs    07/02/19 0533 07/03/19 0511  NA 136 132*  K 4.3 4.0  CL 110 106  CO2 17* 20*  GLUCOSE 72 95  BUN 33* 21  CREATININE 2.51* 1.69*  CALCIUM 8.4* 8.3*   No results for input(s): LABPT, INR in the last 72 hours. No results for input(s): LABURIN in the last 72 hours. Results for orders placed or performed during the hospital encounter of 06/29/19  Urine culture     Status: None   Collection Time: 06/29/19  4:24 PM   Specimen: Urine  Result Value Ref Range Status   Specimen Description   Final    Urine Performed at [place] [address] [place], [place] [address]    Special Requests   Final    NONE Performed at [facility], [place]  [address] [place], [place] [address]    Culture   Final    NO GROWTH Performed at [facility], [place] [address] [place], [place] [address]    Report Status 06/30/2019 FINAL  Final  SARS CORONAVIRUS 2 (TAT 6-24 HRS) Nasopharyngeal Nasopharyngeal Swab     Status: None   Collection Time: 06/29/19  6:45 PM   Specimen: Nasopharyngeal Swab  Result Value Ref Range Status   SARS Coronavirus 2 NEGATIVE NEGATIVE Final    Comment: (NOTE) SARS-CoV-2 target nucleic acids are NOT DETECTED. The SARS-CoV-2 RNA is generally detectable in upper and lower respiratory specimens during the acute phase of infection. Negative results do not preclude SARS-CoV-2 infection, do not rule out co-infections with other pathogens, and should not be used as the sole basis for treatment or other patient management decisions. Negative results must be combined with clinical observations, patient history, and epidemiological information. The expected result is Negative. Fact Sheet for Patients: SugarRoll.be Fact Sheet for Healthcare Providers: https://www.woods-mathews.com/ This test is not yet approved or cleared by the [place] FDA and  has been authorized for detection and/or diagnosis of SARS-CoV-2 by FDA under an Emergency Use Authorization (EUA). This EUA will remain  in effect (meaning this test can be used) for the duration of the COVID-19 declaration under Section 56 4(b)(1) of the Act, 21 U.S.C. section 360bbb-3(b)(1), unless the authorization is terminated or revoked  sooner. Performed at [facility], [place] [address], [place], [place] [address]   Blood culture (routine x 2)     Status: None (Preliminary result)   Collection Time: 06/29/19  6:45 PM   Specimen: Right Antecubital; Blood  Result Value Ref Range Status   Specimen Description RIGHT ANTECUBITAL  Final   Special Requests   Final    BOTTLES DRAWN AEROBIC AND ANAEROBIC Blood Culture  adequate volume Performed at [place] [address] [place], [place] [address]    Culture NO GROWTH 4 DAYS  Final   Report Status PENDING  Incomplete  Blood culture (routine x 2)     Status: None (Preliminary result)   Collection Time: 06/29/19  6:45 PM   Specimen: BLOOD LEFT FOREARM  Result Value Ref Range Status   Specimen Description BLOOD LEFT FOREARM  Final   Special Requests   Final    BOTTLES DRAWN AEROBIC AND ANAEROBIC Blood Culture adequate volume Performed at [place] [address] [place],  [address]    Culture NO GROWTH 4 DAYS  Final   Report Status PENDING  Incomplete    Studies/Results: No results found.   Assessment/Plan: 79 year old African-American male with a large prostate and chronic indwelling Foley catheter who now is finally draining clear.  He needs no additional treatment from our perspective while inpatient.  He can be discharged home with his Foley catheter and then scheduled for follow-up in our clinic to see Dr. Alyson Ingles who manages him in the outpatient setting.   LOS: 3 days   Ardis Hughs 07/03/2019, 5:17 PM

## 2019-07-03 NOTE — Evaluation (Signed)
Physical Therapy Evaluation Patient Details Name: Henry Carter MRN: [id number] DOB: Jun 12, 1940 Today's Date: 07/03/2019   History of Present Illness  Pt is a 79 y.o. male admitted 06/09/19 with AMS and decreased p.o. intake, wife reports pt also fell out of bed hitting head, no LOC. Head CT negative for acute intracranial abnormality. Worked up for [employer], acute renal failure, anemia, severe metabolic acidosis. s/p IVC filter placement via R groin on 3/1 for DVT. Found to have bladder distention. PMH includes COPD, HTN, PVD, DVT on anticoagulation, BPH with chronic indwelling catheter. acute dx of Acute Renal failure  Clinical Impression  The patient required encouragement to mobilize and ambulate a short distance using RW. The patient requires extra time. Patient should progress to return home with 24/7 assistance. Pt admitted with above diagnosis.   Pt currently with functional limitations due to the deficits listed below (see PT Problem List). Pt will benefit from skilled PT to increase their independence and safety with mobility to allow discharge to the venue listed below.   .    Follow Up Recommendations Supervision/Assistance - 24 hour;SNF;Home health PT    Equipment Recommendations  Rolling walker with 5" wheels    Recommendations for Other Services       Precautions / Restrictions Precautions Precautions: Fall Precaution Comments: Chronic indwelling folley catheter Restrictions Weight Bearing Restrictions: No      Mobility  Bed Mobility Overal bed mobility: Needs Assistance Bed Mobility: Supine to Sit     Supine to sit: Min assist Sit to supine: Min assist   General bed mobility comments: min assist for supine<>sit for trunk and LE management, especially lifting LEs back into bed. Very increased time to perform, pt stalling after moving LEs to EOB stating "I need something to drink"  Transfers Overall transfer level: Needs assistance Equipment used: Rolling  walker (2 wheeled) Transfers: Sit to/from Stand Sit to Stand: Min assist         General transfer comment: steady assist to stand at [facility]  Ambulation/Gait Ambulation/Gait assistance: Mod assist;+2 safety/equipment Gait Distance (Feet): 10 Feet Assistive device: Rolling walker (2 wheeled) Gait Pattern/deviations: Step-to pattern Gait velocity: decr   General Gait Details: patient reports that he feels dizzy, declined to ambulate farther than in room.  Stairs            Wheelchair Mobility    Modified Rankin (Stroke Patients Only)       Balance Overall balance assessment: Needs assistance Sitting-balance support: Feet supported;Single extremity supported Sitting balance-Leahy Scale: Fair Sitting balance - Comments: posterior lean while sitting unsupported EOB   Standing balance support: Bilateral upper extremity supported Standing balance-Leahy Scale: Poor Standing balance comment: reliant on RW                             Pertinent Vitals/Pain Pain Assessment: No/denies pain Faces Pain Scale: Hurts a little bit Pain Location: generalized Pain Descriptors / Indicators: Guarding;Grimacing Pain Intervention(s): Monitored during session    Home Living Family/patient expects to be discharged to:: Private residence Living Arrangements: Spouse/significant other Available Help at Discharge: Family Type of Home: House Home Access: Stairs to enter Entrance Stairs-Rails: Can reach both Entrance Stairs-Number of Steps: 3 Home Layout: Two level;Able to live on main level with bedroom/bathroom Home Equipment: [place] Rile - 2 wheels;Cane - single point      Prior Function Level of Independence: Independent with assistive device(s)         Comments: Pt  reports mod indep with SPC; multiple falls     Hand Dominance   Dominant Hand: Right    Extremity/Trunk Assessment   Upper Extremity Assessment Upper Extremity Assessment: Defer to OT evaluation RUE:  (Grossly 3+/5, AROM WFL ) LUE: (Grossly 3+/5, AROM WFL )    Lower Extremity Assessment Lower Extremity Assessment: Generalized weakness    Cervical / Trunk Assessment Cervical / Trunk Assessment: Kyphotic  Communication   Communication: No difficulties  Cognition Arousal/Alertness: Awake/alert Behavior During Therapy: WFL for tasks assessed/performed Overall Cognitive Status: Impaired/Different from baseline Area of Impairment: Memory;Following commands;Safety/judgement;Problem solving                 Orientation Level: Disoriented to;Time;Situation Current Attention Level: Sustained Memory: Decreased short-term memory Following Commands: Follows one step commands with increased time;Follows one step commands consistently Safety/Judgement: Decreased awareness of safety;Decreased awareness of deficits Awareness: Intellectual   General Comments: required encouragement and stress for importance of mobility      General Comments      Exercises     Assessment/Plan    PT Assessment Patient needs continued PT services  PT Problem List Decreased strength;Decreased activity tolerance;Decreased balance;Decreased mobility;Decreased cognition;Decreased knowledge of use of DME;Decreased safety awareness       PT Treatment Interventions DME instruction;Gait training;Functional mobility training;Therapeutic activities;Therapeutic exercise;Balance training;Cognitive remediation;Patient/family education    PT Goals (Current goals can be found in the Care Plan section)  Acute Rehab PT Goals Patient Stated Goal: to get better PT Goal Formulation: With patient Time For Goal Achievement: 07/17/19 Potential to Achieve Goals: Fair    Frequency Min 3X/week   Barriers to discharge        Co-evaluation   Reason for Co-Treatment: For patient/therapist safety;To address functional/ADL transfers PT goals addressed during session: Mobility/safety with mobility;Balance;Proper use of  DME OT goals addressed during session: ADL's and self-care       AM-PAC PT "6 Clicks" Mobility  Outcome Measure Help needed turning from your back to your side while in a flat bed without using bedrails?: A Little Help needed moving from lying on your back to sitting on the side of a flat bed without using bedrails?: A Little Help needed moving to and from a bed to a chair (including a wheelchair)?: A Lot Help needed standing up from a chair using your arms (e.g., wheelchair or bedside chair)?: A Lot Help needed to walk in hospital room?: A Lot Help needed climbing 3-5 steps with a railing? : Total 6 Click Score: 13    End of Session   Activity Tolerance: Patient limited by fatigue Patient left: in chair;with call bell/phone within reach;with chair alarm set Nurse Communication: Mobility status PT Visit Diagnosis: Other abnormalities of gait and mobility (R26.89);Muscle weakness (generalized) (M62.81)    Time: 1400-1420 PT Time Calculation (min) (ACUTE ONLY): 20 min   Charges:   PT Evaluation $PT Eval Low Complexity: [place] PT Acute Rehabilitation Services Pager [phone] Office [phone]   Claretha Cooper 07/03/2019, 3:38 PM

## 2019-07-03 NOTE — Progress Notes (Signed)
RN and charge RN attempted to irrigate 81ml saline, pt noticeably uncomfortable and ask RN to stop. Only 76ml irrigated, some sediment noticed with light pink and no  clots. Pt given PRN medication for pain/ discomfort. RN noticed slight leaking from foley during irrigation. Urology floor charge RN contacted and came to bedside. Suggested to bladder scan. Only 47mL when bladder scanned. No leaking was noted by urology RN.  Pt now resting comfortably, will continue to monitor pt.

## 2019-07-03 NOTE — Progress Notes (Signed)
Occupational Therapy Evaluation Patient Details Name: Henry Carter MRN: [id number] DOB: Nov 14, 1940 Today's Date: 07/03/2019    History of Present Illness Pt is a 79 y.o. male admitted 06/09/19 with AMS and decreased p.o. intake, wife reports pt also fell out of bed hitting head, no LOC. Head CT negative for acute intracranial abnormality. Worked up for [employer], acute renal failure, anemia, severe metabolic acidosis. s/p IVC filter placement via R groin on 3/1 for DVT. Found to have bladder distention. PMH includes COPD, HTN, PVD, DVT on anticoagulation, BPH with chronic indwelling catheter. acute dx of Acute Renal failure   Clinical Impression   PTA Pt reports living with spouse and performing BADL tasks with Modified Independence using SPC to mobilize. Pt requires assist from spouse with IADL tasks.  Pt required Max verbal cues of encouragement for OOB transfer and to participate in functional mobility on 2 separate attempts. Pt was able to complete bed mobility with Minimal Assist with extra time. Pt was not able to don B LE socks or shoes sitting EOB with posterior lean. Pt was able to complete sit to stand transfer with RW and mobilize to recliner with Minimal assist and extra time. Will continue to follow per POC listed below.     Follow Up Recommendations  Home health OT;Supervision/Assistance - 24 hour    Equipment Recommendations  3 in 1 bedside commode    Recommendations for Other Services       Precautions / Restrictions Precautions Precautions: Fall Precaution Comments: Chronic indwelling folley catheter Restrictions Weight Bearing Restrictions: No      Mobility Bed Mobility Overal bed mobility: Needs Assistance Bed Mobility: Supine to Sit     Supine to sit: Min assist Sit to supine: Min assist   General bed mobility comments: min assist for supine<>sit for trunk and LE management, especially lifting LEs back into bed. Very increased time to perform, pt stalling  after moving LEs to EOB stating "I need something to drink"  Transfers Overall transfer level: Needs assistance Equipment used: Rolling walker (2 wheeled) Transfers: Sit to/from Stand Sit to Stand: +2 physical assistance;Min assist              Balance     Sitting balance-Leahy Scale: Poor Sitting balance - Comments: posterior lean while sitting unsupported EOB     Standing balance-Leahy Scale: Poor Standing balance comment: reliant on RW                           ADL either performed or assessed with clinical judgement   ADL Overall ADL's : Needs assistance/impaired Eating/Feeding: Set up;Bed level   Grooming: Set up   Upper Body Bathing: Set up   Lower Body Bathing: Moderate assistance   Upper Body Dressing : Minimal assistance   Lower Body Dressing: Total assistance   Toilet Transfer: Minimal assistance;Grab bars   Toileting- Clothing Manipulation and Hygiene: Minimal assistance   Tub/ Shower Transfer: Minimal assistance   Functional mobility during ADLs: Minimal assistance;Rolling walker       Vision Baseline Vision/History: No visual deficits       Perception     Praxis      Pertinent Vitals/Pain Pain Assessment: No/denies pain     Hand Dominance Right   Extremity/Trunk Assessment Upper Extremity Assessment Upper Extremity Assessment: RUE deficits/detail;LUE deficits/detail RUE: (Grossly 3+/5, AROM WFL ) LUE: (Grossly 3+/5, AROM WFL )   Lower Extremity Assessment Lower Extremity Assessment: Defer to PT evaluation  Communication Communication Communication: No difficulties   Cognition Arousal/Alertness: Awake/alert Behavior During Therapy: WFL for tasks assessed/performed Overall Cognitive Status: Impaired/Different from baseline Area of Impairment: Memory;Following commands;Safety/judgement;Problem solving                 Orientation Level: Disoriented to;Time;Situation Current Attention Level:  Sustained Memory: Decreased short-term memory Following Commands: Follows one step commands with increased time;Follows one step commands consistently Safety/Judgement: Decreased awareness of safety;Decreased awareness of deficits Awareness: Intellectual       General Comments       Exercises     Shoulder Instructions      Home Living Family/patient expects to be discharged to:: Private residence Living Arrangements: Spouse/significant other Available Help at Discharge: Family Type of Home: House Home Access: Stairs to enter [employer] of Steps: 3 Entrance Stairs-Rails: Can reach both Home Layout: Two level;Able to live on main level with bedroom/bathroom     Bathroom Shower/Tub: Tub/shower unit;Walk-in shower   Bathroom Toilet: Standard Bathroom Accessibility: Yes How Accessible: Accessible via walker Home Equipment: [place] - 2 wheels;Cane - single point          Prior Functioning/Environment Level of Independence: Independent with assistive device(s)        Comments: Pt reports mod indep with SPC; multiple falls        OT Problem List: Decreased strength;Decreased activity tolerance;Impaired balance (sitting and/or standing);Decreased safety awareness;Pain      OT Treatment/Interventions: Self-care/ADL training;Therapeutic exercise;Energy conservation;DME and/or AE instruction;Therapeutic activities;Patient/family education;Balance training    OT Goals(Current goals can be found in the care plan section) Acute Rehab OT Goals Patient Stated Goal: to get better OT Goal Formulation: With patient Time For Goal Achievement: 07/17/19 Potential to Achieve Goals: Good  OT Frequency: Min 2X/week   Barriers to D/C:            Co-evaluation PT/OT/SLP Co-Evaluation/Treatment: Yes Reason for Co-Treatment: For patient/therapist safety;To address functional/ADL transfers PT goals addressed during session: Mobility/safety with mobility;Balance;Proper  use of DME OT goals addressed during session: ADL's and self-care      AM-PAC OT "6 Clicks" Daily Activity     Outcome Measure Help from another person eating meals?: A Little Help from another person taking care of personal grooming?: A Little Help from another person toileting, which includes using toliet, bedpan, or urinal?: A Lot Help from another person bathing (including washing, rinsing, drying)?: A Lot Help from another person to put on and taking off regular upper body clothing?: A Little Help from another person to put on and taking off regular lower body clothing?: A Lot 6 Click Score: 15   End of Session Nurse Communication: Mobility status  Activity Tolerance: Patient tolerated treatment well Patient left: in chair;with call bell/phone within reach;with family/visitor present  OT Visit Diagnosis: Unsteadiness on feet (R26.81);Muscle weakness (generalized) (M62.81)                Time: [id number] OT Time Calculation (min): 24 min Charges:  OT General Charges $OT Visit: 1 Visit OT Evaluation $OT Eval Moderate Complexity: 1 Mod  Codie Krogh OTR/L   Etana Beets 07/03/2019, 3:19 PM

## 2019-07-03 NOTE — Progress Notes (Signed)
During irrigation at 2030 with 60ml saline, pt noticeably uncomfortable and only 28ml noted in accumulation after irrigation with tiny clots. Pt given PRN medication for discomfort and had a BM, and stated that he had been having this amount of discomfort all day upon irrigation. As time progressed, patient continued to have discomfort and foley still draining but very little. I attempted to irrigate using an alternate method and was able to push saline into foley but no return urine given. Urology floor charge RN assisted on an additional attempt to hand irrgate with no success. Placed back to drainage tube and called urology MD. He instructed me to place 21fr and that attempt was unsuccessful by myself and Sharyn Lull, Therapist, sports. Urology called again and arrived at bedside to place.

## 2019-07-03 NOTE — Care Management Important Message (Signed)
Important Message  Patient Details IM Letter given to [name] SW Case Manager to present to the Patient Name: Henry Carter MRN: [id number] Date of Birth: [birth year]   Medicare Important Message Given:  Yes     [name] 07/03/2019, 9:44 AM

## 2019-07-03 NOTE — Progress Notes (Signed)
PROGRESS NOTE  Henry Carter [id number] DOB: 1940/05/10 DOA: 06/29/2019 PCP: [name], MD  HPI/Recap of past 24 hours: HPI from Dr Marin Roberts Gipe is a 79 y.o. male with medical history significant for BPH with chronic indwelling Foley, hypertension, hyperlipidemia, PVD, anemia of iron deficiency and chronic blood loss, history of DVT with IVC filter placed (not on anticoagulation due to recent hematuria) who was recently admitted from 05/20/2019-06/15/2019 for acute renal failure secondary to obstructed Foley catheter. He was also noted to have anemia due to hematuria and was transfused 2 units PRBCs.  His Xarelto was discontinued and an IVC filter was placed on 06/11/2019.  Urine culture from 06/09/2019 grew corynebacterium species diphtheroids which was felt to be a skin contaminant. Patient followed up with urology as an outpatient and had his Foley catheter exchanged, subsequently started on oral ciprofloxacin for presumed UTI, which patient stated he took 1 dose PTA. Patient reported decreased amount of urine which was noted to be bloody with some suprapubic tenderness. In the ED, VSS, labs showed WBC 14.4, hemoglobin 9.4, bicarb 19, BUN 44, creatinine 4.76. UA showed 21-50 RBC, greater than 50 WBCs, few bacteria. Patient's catheter was flushed and the hospitalist service was consulted for further evaluation and management.     Overnight, patient catheter noted to not be draining unable to irrigate by nurses.  Urology was called, attempted to irrigate the Foley without success.  Catheter was replaced, drained turbid purulent thick urine.  Today, patient reported suprapubic tenderness, Foley noted to be draining pinkish urine.  Patient denies any chest pain, shortness of breath, fever/chills.      Assessment/Plan: Principal Problem:   Acute renal failure (ARF) (HCC) Active Problems:   Hyperlipidemia   Essential hypertension   Benign prostatic hyperplasia   Iron  deficiency anemia due to chronic blood loss   Chronic indwelling Foley catheter   History of DVT (deep vein thrombosis)   Obstruction of Foley catheter (HCC)   AKI (acute kidney injury) ([place])   Acute renal failure/metabolic acidosis likely 2/2 obstructed Foley catheter Gross hematuria AKI slowing improving UA could not be analyzed due to significant hematuria, UC showed no growth Renal ultrasound showed prostate enlargement, markedly irregular bladder wall and layering material posteriorly possibly blood clot.  Moderate to severe bilateral hydronephrosis Urology consulted, Foley irrigated every 6-8 hours with saline, start finasteride Discussed with urology Dr. Louis Meckel on 07/03/2019, will see patient today and make further recommendations Continue ciprofloxacin Continue IV fluids, Foley care  Anemia of chronic blood loss/iron deficiency Hemoglobin slowly trending downwards Anemia panel showed iron 12, sats 4, ferritin 77, folate 10.2, vitamin B12, 291 Type and screen, transfuse if hemoglobin less than 7 Feraheme x1 dose on 07/01/19, start oral and vitamin B12 supplements Daily CBC  BPH with chronic indwelling Foley History of elevated PSA Ultrasound and biopsy has been scheduled as outpt Urology on board, await further recs pending disposition Start finasteride  Hypertension Continue home amlodipine  Hyperlipidemia Continue statins  History of DVT  Diagnosed on 05/11/2019, status post IVC filter placement on 06/11/2019 Previously on Xarelto, discontinued due to gross hematuria  4 cm infrarenal abdominal aortic aneurysm Seen on CT 06/09/2019 Follow-up ultrasound in 1 year       Malnutrition Type:      Malnutrition Characteristics:      Nutrition Interventions:       Estimated body mass index is 19.58 kg/m as calculated from the following:   Height as of this encounter: 5\' 10"  (  1.778 m).   Weight as of this encounter: 61.9 kg.     Code Status:  Full  Family Communication: Discussed extensively with wife on 07/01/19  Disposition Plan: Patient still requiring frequent Foley catheter irrigation/declogging at least twice a day, urology recommendations pending for disposition.  Creatinine slowly improving .  May be ready to DC home on 07/04/2019 pending urology recs   Consultants:  Urology  Procedures:  None  Antimicrobials:  Ciprofloxacin  DVT prophylaxis: SCDs   Objective: Vitals:   07/02/19 1300 07/02/19 2051 07/03/19 0527 07/03/19 1229  BP: 129/82 140/80 130/81 (!) 150/93  Pulse: (!) 101 91 90 83  Resp:  18 16 20   Temp: 98.7 F (37.1 C) 97.9 F (36.6 C) 98.6 F (37 C) 97.7 F (36.5 C)  TempSrc: Oral Oral Oral   SpO2: 100% 100% 100% 98%  Weight:   61.9 kg   Height:        Intake/Output Summary (Last 24 hours) at 07/03/2019 1627 Last data filed at 07/03/2019 1230 Gross per 24 hour  Intake 1400 ml  Output 4225 ml  Net -2825 ml   Filed Weights   07/02/19 0415 07/02/19 0950 07/03/19 0527  Weight: 61.5 kg 60.2 kg 61.9 kg    Exam:  General: NAD, chronically ill-appearing  Cardiovascular: S1, S2 present  Respiratory: CTAB  Abdomen: Soft, supra-pubic tenderness, nondistended, bowel sounds present  Musculoskeletal: No bilateral pedal edema noted  Skin: Normal  Psychiatry: Normal mood   Data Reviewed: CBC: Recent Labs  Lab 06/29/19 1624 06/29/19 1624 06/30/19 0533 07/01/19 0617 07/02/19 0533 07/02/19 1724 07/03/19 0511  WBC 14.4*   < > 11.1* 8.8 11.1* 10.3 10.7*  NEUTROABS 12.6*  --   --  7.7 9.4*  --  9.1*  HGB 9.4*   < > 8.0* 8.0* 7.5* 8.0* 7.7*  HCT 30.2*   < > 25.1* 26.0* 24.1* 25.7* 24.4*  MCV 87.5   < > 87.5 87.2 86.7 86.2 85.9  PLT 577*   < > 433* 447* 399 424* 391   < > = values in this interval not displayed.   Basic Metabolic Panel: Recent Labs  Lab 06/29/19 1624 06/30/19 0533 07/01/19 0617 07/02/19 0533 07/03/19 0511  NA 130* 132* 134* 136 132*  K 4.4 4.0 4.2 4.3  4.0  CL 99 106 108 110 106  CO2 19* 18* 19* 17* 20*  GLUCOSE 93 81 80 72 95  BUN 44* 39* 34* 33* 21  CREATININE 4.76* 3.01* 2.81* 2.51* 1.69*  CALCIUM 8.8* 8.0* 8.4* 8.4* 8.3*   GFR: Estimated Creatinine Clearance: 31.5 mL/min (A) (by C-G formula based on SCr of 1.69 mg/dL (H)). Liver Function Tests: No results for input(s): AST, ALT, ALKPHOS, BILITOT, PROT, ALBUMIN in the last 168 hours. No results for input(s): LIPASE, AMYLASE in the last 168 hours. No results for input(s): AMMONIA in the last 168 hours. Coagulation Profile: No results for input(s): INR, PROTIME in the last 168 hours. Cardiac Enzymes: No results for input(s): CKTOTAL, CKMB, CKMBINDEX, TROPONINI in the last 168 hours. BNP (last 3 results) No results for input(s): PROBNP in the last 8760 hours. HbA1C: No results for input(s): HGBA1C in the last 72 hours. CBG: No results for input(s): GLUCAP in the last 168 hours. Lipid Profile: No results for input(s): CHOL, HDL, LDLCALC, TRIG, CHOLHDL, LDLDIRECT in the last 72 hours. Thyroid Function Tests: No results for input(s): TSH, T4TOTAL, FREET4, T3FREE, THYROIDAB in the last 72 hours. Anemia Panel: Recent Labs  07/01/19 0617  VITAMINB12 291  FOLATE 10.2  FERRITIN 77  TIBC 272  IRON 12*   Urine analysis:    Component Value Date/Time   COLORURINE BROWN (A) 06/29/2019 1624   APPEARANCEUR TURBID (A) 06/29/2019 1624   LABSPEC NOT CALCULATED 06/29/2019 1624   PHURINE NOT CALCULATED 06/29/2019 1624   GLUCOSEU (A) 06/29/2019 1624    TEST NOT REPORTED DUE TO COLOR INTERFERENCE OF URINE PIGMENT   HGBUR (A) 06/29/2019 1624    TEST NOT REPORTED DUE TO COLOR INTERFERENCE OF URINE PIGMENT   BILIRUBINUR (A) 06/29/2019 1624    TEST NOT REPORTED DUE TO COLOR INTERFERENCE OF URINE PIGMENT   BILIRUBINUR negative 08/01/2017 1649   KETONESUR (A) 06/29/2019 1624    TEST NOT REPORTED DUE TO COLOR INTERFERENCE OF URINE PIGMENT   PROTEINUR (A) 06/29/2019 1624    TEST NOT  REPORTED DUE TO COLOR INTERFERENCE OF URINE PIGMENT   UROBILINOGEN negative (A) 08/01/2017 1649   NITRITE (A) 06/29/2019 1624    TEST NOT REPORTED DUE TO COLOR INTERFERENCE OF URINE PIGMENT   LEUKOCYTESUR (A) 06/29/2019 1624    TEST NOT REPORTED DUE TO COLOR INTERFERENCE OF URINE PIGMENT   Sepsis Labs: @LABRCNTIP (procalcitonin:4,lacticidven:4)  ) Recent Results (from the past 240 hour(s))  Urine culture     Status: None   Collection Time: 06/29/19  4:24 PM   Specimen: Urine  Result Value Ref Range Status   Specimen Description   Final    Urine Performed at [facility], [place] [address], [place], [place] [address]    Special Requests   Final    NONE Performed at [facility], [place] [address] [place], [place] [address]    Culture   Final    NO GROWTH Performed at [facility], [place] [address], [place], [place] [address]    Report Status 06/30/2019 FINAL  Final  SARS CORONAVIRUS 2 (TAT 6-24 HRS) Nasopharyngeal Nasopharyngeal Swab     Status: None   Collection Time: 06/29/19  6:45 PM   Specimen: Nasopharyngeal Swab  Result Value Ref Range Status   SARS Coronavirus 2 NEGATIVE NEGATIVE Final    Comment: (NOTE) SARS-CoV-2 target nucleic acids are NOT DETECTED. The SARS-CoV-2 RNA is generally detectable in upper and lower respiratory specimens during the acute phase of infection. Negative results do not preclude SARS-CoV-2 infection, do not rule out co-infections with other pathogens, and should not be used as the sole basis for treatment or other patient management decisions. Negative results must be combined with clinical observations, patient history, and epidemiological information. The expected result is Negative. Fact Sheet for Patients: SugarRoll.be Fact Sheet for Healthcare Providers: https://www.woods-mathews.com/ This test is not yet approved or cleared by the [place] FDA and   has been authorized for detection and/or diagnosis of SARS-CoV-2 by FDA under an Emergency Use Authorization (EUA). This EUA will remain  in effect (meaning this test can be used) for the duration of the COVID-19 declaration under Section 56 4(b)(1) of the Act, 21 U.S.C. section 360bbb-3(b)(1), unless the authorization is terminated or revoked sooner. Performed at [facility], [place] [address], [place], [place] [address]   Blood culture (routine x 2)     Status: None (Preliminary result)   Collection Time: 06/29/19  6:45 PM   Specimen: Right Antecubital; Blood  Result Value Ref Range Status   Specimen Description RIGHT ANTECUBITAL  Final   Special Requests   Final    BOTTLES DRAWN AEROBIC  AND ANAEROBIC Blood Culture adequate volume Performed at [facility] [address], [place], [place] [address]    Culture NO GROWTH 4 DAYS  Final   Report Status PENDING  Incomplete  Blood culture (routine x 2)     Status: None (Preliminary result)   Collection Time: 06/29/19  6:45 PM   Specimen: BLOOD LEFT FOREARM  Result Value Ref Range Status   Specimen Description BLOOD LEFT FOREARM  Final   Special Requests   Final    BOTTLES DRAWN AEROBIC AND ANAEROBIC Blood Culture adequate volume Performed at [facility] [address], [place], [place] [address]    Culture NO GROWTH 4 DAYS  Final   Report Status PENDING  Incomplete      Studies: No results found.  Scheduled Meds: . amLODipine  10 mg Oral Daily  . Chlorhexidine Gluconate Cloth  6 each Topical Daily  . ferrous sulfate  325 mg Oral Q breakfast  . finasteride  5 mg Oral Daily  . pantoprazole  40 mg Oral BID  . polyethylene glycol  17 g Oral Daily  . senna-docusate  1 tablet Oral QHS  . simvastatin  20 mg Oral QHS  . vitamin B-12  1,000 mcg Oral Daily    Continuous Infusions: . sodium chloride 100 mL/hr at 07/02/19 2023     LOS: 3 days     Alma Friendly, MD [facility]  If 7PM-7AM, please contact night-coverage [email or web address] 07/03/2019, 4:27 PM

## 2019-07-04 DIAGNOSIS — N179 Acute kidney failure, unspecified: Secondary | ICD-10-CM

## 2019-07-04 DIAGNOSIS — N401 Enlarged prostate with lower urinary tract symptoms: Secondary | ICD-10-CM

## 2019-07-04 LAB — CULTURE, BLOOD (ROUTINE X 2)
Culture: NO GROWTH
Culture: NO GROWTH
Special Requests: ADEQUATE
Special Requests: ADEQUATE

## 2019-07-04 LAB — CBC WITH DIFFERENTIAL/PLATELET
Abs Immature Granulocytes: 0.12 10*3/uL — ABNORMAL HIGH (ref 0.00–0.07)
Basophils Absolute: 0 10*3/uL (ref 0.0–0.1)
Basophils Relative: 1 %
Eosinophils Absolute: 0.2 10*3/uL (ref 0.0–0.5)
Eosinophils Relative: 3 %
HCT: 24.4 % — ABNORMAL LOW (ref 39.0–52.0)
Hemoglobin: 7.7 g/dL — ABNORMAL LOW (ref 13.0–17.0)
Immature Granulocytes: 2 %
Lymphocytes Relative: 11 %
Lymphs Abs: 0.7 10*3/uL (ref 0.7–4.0)
MCH: 26.7 pg (ref 26.0–34.0)
MCHC: 31.6 g/dL (ref 30.0–36.0)
MCV: 84.7 fL (ref 80.0–100.0)
Monocytes Absolute: 0.3 10*3/uL (ref 0.1–1.0)
Monocytes Relative: 6 %
Neutro Abs: 4.7 10*3/uL (ref 1.7–7.7)
Neutrophils Relative %: 77 %
Platelets: 404 10*3/uL — ABNORMAL HIGH (ref 150–400)
RBC: 2.88 MIL/uL — ABNORMAL LOW (ref 4.22–5.81)
RDW: 16.6 % — ABNORMAL HIGH (ref 11.5–15.5)
WBC: 6 10*3/uL (ref 4.0–10.5)
nRBC: 0 % (ref 0.0–0.2)

## 2019-07-04 LAB — BASIC METABOLIC PANEL
Anion gap: 7 (ref 5–15)
BUN: 16 mg/dL (ref 8–23)
CO2: 21 mmol/L — ABNORMAL LOW (ref 22–32)
Calcium: 8.1 mg/dL — ABNORMAL LOW (ref 8.9–10.3)
Chloride: 105 mmol/L (ref 98–111)
Creatinine, Ser: 1.29 mg/dL — ABNORMAL HIGH (ref 0.61–1.24)
GFR calc Af Amer: >60 mL/min (ref >60)
GFR calc non Af Amer: 53 mL/min — ABNORMAL LOW (ref >60)
Glucose, Bld: 89 mg/dL (ref 70–99)
Potassium: 4 mmol/L (ref 3.5–5.1)
Sodium: 133 mmol/L — ABNORMAL LOW (ref 135–145)

## 2019-07-04 LAB — SARS CORONAVIRUS 2 (TAT 6-24 HRS): SARS Coronavirus 2: NEGATIVE

## 2019-07-04 NOTE — NC FL2 (Signed)
[place] LEVEL OF CARE SCREENING TOOL     IDENTIFICATION  Patient Name: Henry Carter Birthdate: 12-Dec-1940 Sex: male Admission Date (Current Location): 06/29/2019  [facility] and [place] Number:  Herbalist and Address:  [facility],  [place], [place]      Provider Number: [id number]  Attending Physician Name and Address:  Georgette Shell, MD  Relative Name and Phone Number:  [place], [place], [phone]    Current Level of Care: Hospital Recommended Level of Care: [place] Prior Approval Number:    Date Approved/Denied:   PASRR Number: [id number]  Discharge Plan: SNF    Current Diagnoses: Patient Active Problem List   Diagnosis Date Noted  . AKI (acute kidney injury) ([place]) 06/30/2019  . Chronic indwelling Foley catheter 06/29/2019  . History of DVT (deep vein thrombosis) 06/29/2019  . Obstruction of Foley catheter ([place]) 06/29/2019  . Acute bilateral obstructive uropathy   . Hypokalemia   . Acute metabolic encephalopathy   . Hyponatremia   . Hematuria 06/09/2019  . Acute renal failure (ARF) ([place]) 06/09/2019  . Acute blood loss anemia 06/09/2019  . Hyperkalemia 06/09/2019  . Metabolic acidosis 99991111  . Left leg DVT ([place]) 05/18/2019  . Left leg swelling 05/11/2019  . Pre-operative cardiovascular examination 03/22/2019  . 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[place]) 07/11/2018  . Right arm weakness 07/21/2017  . Non compliance w medication regimen 07/30/2016  . 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[place]) 12/11/2015  . Mild cognitive impairment 12/11/2015  . 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 Healthcare maintenance 02/07/2013  . Iron deficiency anemia due to chronic blood loss 01/18/2013  . C O P D 04/08/2010  . Atherosclerosis of native artery of extremity with intermittent claudication ([place]) 11/26/2008  . Benign prostatic hyperplasia 10/20/2007  . PERIPHERAL VASCULAR  DISEASE 05/19/2007  . Essential hypertension 02/01/2007  . Hyperlipidemia 01/03/2007    Orientation RESPIRATION BLADDER Height & Weight     Self, Time, Situation, Place  Normal Indwelling catheter Weight: 135 lb 5.8 oz (61.4 kg) Height:  5\' 10"  (177.8 cm)(stated )  BEHAVIORAL SYMPTOMS/MOOD NEUROLOGICAL BOWEL NUTRITION STATUS    (none) Incontinent Diet(see discharge summary)  AMBULATORY STATUS COMMUNICATION OF NEEDS Skin   Extensive Assist Verbally Normal                       Personal Care Assistance Level of Assistance  Bathing, Feeding, Dressing, Total care Bathing Assistance: Limited assistance Feeding assistance: Independent Dressing Assistance: Limited assistance Total Care Assistance: Limited assistance   Functional Limitations Info  Sight, Hearing, Speech Sight Info: Adequate Hearing Info: Adequate Speech Info: Adequate    SPECIAL CARE FACTORS FREQUENCY  PT (By licensed PT), OT (By licensed OT)     PT Frequency: 5x/w OT Frequency: 5x/w            Contractures Contractures Info: Not present    Additional Factors Info  Code Status Code Status Info: Full Code             Current Medications (07/04/2019):  This is the current hospital active medication list Current Facility-Administered Medications  Medication Dose Route Frequency Provider Last Rate Last Admin  . 0.9 %  sodium chloride infusion   Intravenous Continuous Alma Friendly, MD 75 mL/hr at 07/04/19 0552 New Bag at 07/04/19 0552  . acetaminophen (TYLENOL) tablet 650 mg  650 mg Oral Q6H PRN Lenore Cordia, MD   650 mg  at 06/30/19 2154   Or  . acetaminophen (TYLENOL) suppository 650 mg  650 mg Rectal Q6H PRN Zada Finders R, MD      . amLODipine (NORVASC) tablet 10 mg  10 mg Oral Daily Lenore Cordia, MD   10 mg at 07/04/19 1036  . Chlorhexidine Gluconate Cloth 2 % PADS 6 each  6 each Topical Daily Alma Friendly, MD   6 each at 07/03/19 1017  . ferrous sulfate tablet 325 mg  325  mg Oral Q breakfast Alma Friendly, MD   325 mg at 07/04/19 [id number]  . finasteride (PROSCAR) tablet 5 mg  5 mg Oral Daily Franchot Gallo, MD   5 mg at 07/04/19 1035  . HYDROcodone-acetaminophen (NORCO/VICODIN) 5-325 MG per tablet 1 tablet  1 tablet Oral Q6H PRN Alma Friendly, MD   1 tablet at 07/03/19 2241  . lip balm (CARMEX) ointment   Topical PRN Alma Friendly, MD   Given at 07/02/19 2107  . ondansetron (ZOFRAN) tablet 4 mg  4 mg Oral Q6H PRN Lenore Cordia, MD       Or  . ondansetron (ZOFRAN) injection 4 mg  4 mg Intravenous Q6H PRN Zada Finders R, MD      . pantoprazole (PROTONIX) EC tablet 40 mg  40 mg Oral BID Lenore Cordia, MD   40 mg at 07/04/19 1035  . polyethylene glycol (MIRALAX / GLYCOLAX) packet 17 g  17 g Oral Daily Alma Friendly, MD   17 g at 06/30/19 1157  . senna-docusate (Senokot-S) tablet 1 tablet  1 tablet Oral QHS Alma Friendly, MD   1 tablet at 07/03/19 2149  . simvastatin (ZOCOR) tablet 20 mg  20 mg Oral QHS Lenore Cordia, MD   20 mg at 07/03/19 2149  . traZODone (DESYREL) tablet 25 mg  25 mg Oral QHS PRN Mansy, Jan A, MD   25 mg at 07/03/19 2241  . vitamin B-12 (CYANOCOBALAMIN) tablet 1,000 mcg  1,000 mcg Oral Daily Alma Friendly, MD   1,000 mcg at 07/04/19 1036     Discharge Medications: Please see discharge summary for a list of discharge medications.  Relevant Imaging Results:  Relevant Lab Results:   Additional Information SSN# [id number]  Bethann Berkshire, LCSW

## 2019-07-04 NOTE — Consult Note (Signed)
   [facility] Inpatient Consult   07/04/2019  Danyell Haser [birth year] [id number]  Patient chart has been reviewed for readmissions less than 30 days and for medium risk score, 19%, for unplanned readmissions.  Patient assessed for community [place] [facility] follow up needs.  Chart review reveals current recommendation by PT evaluation for SNF. Will continue to follow for progression and disposition plans.  Of note, [facility] Care Management services does not replace or interfere with any services that are arranged by inpatient case management or social work.  Netta Cedars, MSN, [facility] Liaison Nurse Mobile Phone [phone]  Toll free office [phone]

## 2019-07-04 NOTE — NC FL2 (Signed)
[place] LEVEL OF CARE SCREENING TOOL     IDENTIFICATION  Patient Name: Henry Carter Birthdate: January 07, 1941 Sex: male Admission Date (Current Location): 06/29/2019  [facility] and [place] Number:  Herbalist and Address:  [facility],  [place], [place]      Provider Number: [id number]  Attending Physician Name and Address:  Georgette Shell, MD  Relative Name and Phone Number:  [place], [place], [phone]    Current Level of Care: Hospital Recommended Level of Care: [place] Prior Approval Number:    Date Approved/Denied:   PASRR Number: [id number]  Discharge Plan: SNF    Current Diagnoses: Patient Active Problem List   Diagnosis Date Noted  . AKI (acute kidney injury) ([place]) 06/30/2019  . Chronic indwelling Foley catheter 06/29/2019  . History of DVT (deep vein thrombosis) 06/29/2019  . Obstruction of Foley catheter ([place]) 06/29/2019  . Acute bilateral obstructive uropathy   . Hypokalemia   . Acute metabolic encephalopathy   . Hyponatremia   . Hematuria 06/09/2019  . Acute renal failure (ARF) ([place]) 06/09/2019  . Acute blood loss anemia 06/09/2019  . Hyperkalemia 06/09/2019  . Metabolic acidosis 99991111  . Left leg DVT ([place]) 05/18/2019  . Left leg swelling 05/11/2019  . Pre-operative cardiovascular examination 03/22/2019  . 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[place]) 07/11/2018  . Right arm weakness 07/21/2017  . Non compliance w medication regimen 07/30/2016  . 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[place]) 12/11/2015  . Mild cognitive impairment 12/11/2015  . 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 Healthcare maintenance 02/07/2013  . Iron deficiency anemia due to chronic blood loss 01/18/2013  . C O P D 04/08/2010  . Atherosclerosis of native artery of extremity with intermittent claudication ([place]) 11/26/2008  . Benign prostatic hyperplasia 10/20/2007  . PERIPHERAL VASCULAR  DISEASE 05/19/2007  . Essential hypertension 02/01/2007  . Hyperlipidemia 01/03/2007    Orientation RESPIRATION BLADDER Height & Weight     Self, Time, Situation, Place  Normal Indwelling catheter Weight: 135 lb 5.8 oz (61.4 kg) Height:  5\' 10"  (177.8 cm)(stated )  BEHAVIORAL SYMPTOMS/MOOD NEUROLOGICAL BOWEL NUTRITION STATUS    (none) Incontinent Diet(see discharge summary)  AMBULATORY STATUS COMMUNICATION OF NEEDS Skin   Extensive Assist Verbally Normal                       Personal Care Assistance Level of Assistance  Bathing, Feeding, Dressing, Total care Bathing Assistance: Limited assistance Feeding assistance: Independent Dressing Assistance: Limited assistance Total Care Assistance: Limited assistance   Functional Limitations Info  Sight, Hearing, Speech Sight Info: Adequate Hearing Info: Adequate Speech Info: Adequate    SPECIAL CARE FACTORS FREQUENCY  PT (By licensed PT), OT (By licensed OT)     PT Frequency: 5x/w OT Frequency: 5x/w            Contractures Contractures Info: Not present    Additional Factors Info  Code Status Code Status Info: Full Code             Current Medications (07/04/2019):  This is the current hospital active medication list Current Facility-Administered Medications  Medication Dose Route Frequency Provider Last Rate Last Admin  . 0.9 %  sodium chloride infusion   Intravenous Continuous Alma Friendly, MD 75 mL/hr at 07/04/19 0552 New Bag at 07/04/19 0552  . acetaminophen (TYLENOL) tablet 650 mg  650 mg Oral Q6H PRN Lenore Cordia, MD   650 mg  at 06/30/19 2154   Or  . acetaminophen (TYLENOL) suppository 650 mg  650 mg Rectal Q6H PRN Zada Finders R, MD      . amLODipine (NORVASC) tablet 10 mg  10 mg Oral Daily Lenore Cordia, MD   10 mg at 07/04/19 1036  . Chlorhexidine Gluconate Cloth 2 % PADS 6 each  6 each Topical Daily Alma Friendly, MD   6 each at 07/03/19 1017  . ferrous sulfate tablet 325 mg  325  mg Oral Q breakfast Alma Friendly, MD   325 mg at 07/04/19 [id number]  . finasteride (PROSCAR) tablet 5 mg  5 mg Oral Daily Franchot Gallo, MD   5 mg at 07/04/19 1035  . HYDROcodone-acetaminophen (NORCO/VICODIN) 5-325 MG per tablet 1 tablet  1 tablet Oral Q6H PRN Alma Friendly, MD   1 tablet at 07/03/19 2241  . lip balm (CARMEX) ointment   Topical PRN Alma Friendly, MD   Given at 07/02/19 2107  . ondansetron (ZOFRAN) tablet 4 mg  4 mg Oral Q6H PRN Lenore Cordia, MD       Or  . ondansetron (ZOFRAN) injection 4 mg  4 mg Intravenous Q6H PRN Zada Finders R, MD      . pantoprazole (PROTONIX) EC tablet 40 mg  40 mg Oral BID Lenore Cordia, MD   40 mg at 07/04/19 1035  . polyethylene glycol (MIRALAX / GLYCOLAX) packet 17 g  17 g Oral Daily Alma Friendly, MD   17 g at 06/30/19 1157  . senna-docusate (Senokot-S) tablet 1 tablet  1 tablet Oral QHS Alma Friendly, MD   1 tablet at 07/03/19 2149  . simvastatin (ZOCOR) tablet 20 mg  20 mg Oral QHS Lenore Cordia, MD   20 mg at 07/03/19 2149  . traZODone (DESYREL) tablet 25 mg  25 mg Oral QHS PRN Mansy, Jan A, MD   25 mg at 07/03/19 2241  . vitamin B-12 (CYANOCOBALAMIN) tablet 1,000 mcg  1,000 mcg Oral Daily Alma Friendly, MD   1,000 mcg at 07/04/19 1036     Discharge Medications: Please see discharge summary for a list of discharge medications.  Relevant Imaging Results:  Relevant Lab Results:   Additional Information SSN# [id number]  Bethann Berkshire, LCSW

## 2019-07-04 NOTE — TOC Initial Note (Addendum)
Transition of Care [facility]) - Initial/Assessment Note    Patient Details  Name: Henry Carter MRN: [id number] Date of Birth: 1941/04/10  [facility]) CM/SW Contact:    Bethann Berkshire, LCSW Phone Number: 07/04/2019, 10:45 AM  Clinical Narrative:    Responded to [facility] consult. CSW discussed with pt recommendation for 24/7 assistance/SNF. Pt. Explained he would prefer to return home if possible. He lives at home with wife. No DME at home and does not recall previous [facility] services. CSW followed up phone call with spouse. Spouse would not be able to provide 24/7 assistance and preferred SNF. Spouse came to visit, CSW discussed further with pt and spouse that SNF would be best option. Pt and spouse agreed with concerns of visitation policies. Bed requests sent. TOC will continue to follow.   1330: Called [employer] for pre-authorization. [facility] does not manage member; SNF should submit authorization docs.   1600: CSW met with pt and wife to discuss SNF offers. Pt. And wife were ambivalent about SNF. After discussion Wife and pt. Decided that pt would be able to return home with [facility] and rolling walker. Pt has received HH with [facility] in past and is okay with pursuing for [facility].    Expected Discharge Plan: Skilled Nursing Facility Barriers to Discharge: Other (comment)(Waiting to decide on SNF vs HH)   Patient Goals and CMS Choice Patient states their goals for this hospitalization and ongoing recovery are:: Return Home      Expected Discharge Plan and Services Expected Discharge Plan: [place]       Living arrangements for the past 2 months: Single Family Home                                      Prior Living Arrangements/Services Living arrangements for the past 2 months: Single Family Home Lives with:: Spouse Patient language and need for interpreter reviewed:: Yes Do you feel safe going back to the place where you live?: Yes      Need for Family  Participation in Patient Care: Yes (Comment) Care giver support system in place?: Yes (comment)   Criminal Activity/Legal Involvement Pertinent to Current Situation/Hospitalization: No - Comment as needed  Activities of Daily Living Home Assistive Devices/Equipment: Other (Comment)(indwelling urinary catheter) ADL Screening (condition at time of admission) Patient's cognitive ability adequate to safely complete daily activities?: Yes Is the patient deaf or have difficulty hearing?: No Does the patient have difficulty seeing, even when wearing glasses/contacts?: No Does the patient have difficulty concentrating, remembering, or making decisions?: No Patient able to express need for assistance with ADLs?: Yes Does the patient have difficulty dressing or bathing?: No Independently performs ADLs?: Yes (appropriate for developmental age) Does the patient have difficulty walking or climbing stairs?: No Weakness of Legs: None Weakness of Arms/Hands: None  Permission Sought/Granted   Permission granted to share information with : Yes, Verbal Permission Granted  Share Information with NAME: Won, Kreuzer     Permission granted to share info w Relationship: Spouse  Permission granted to share info w Contact Information: [phone]  Emotional Assessment Appearance:: Appears stated age Attitude/Demeanor/Rapport: Engaged Affect (typically observed): Calm Orientation: : Oriented to Self, Oriented to Place, Oriented to  Time, Oriented to Situation Alcohol / Substance Use: Not Applicable Psych Involvement: No (comment)  Admission diagnosis:  Gross hematuria [R31.0] Acute renal failure (ARF) (HCC) [N17.9] Obstruction of Foley catheter, initial encounter (  [place]) [T83.091A] Acute renal failure, unspecified acute renal failure type ([place]) [N17.9] AKI (acute kidney injury) ([place]) [N17.9] Patient Active Problem List   Diagnosis Date Noted  . AKI (acute kidney injury) ([place]) 06/30/2019  .  Chronic indwelling Foley catheter 06/29/2019  . History of DVT (deep vein thrombosis) 06/29/2019  . Obstruction of Foley catheter ([place]) 06/29/2019  . Acute bilateral obstructive uropathy   . Hypokalemia   . Acute metabolic encephalopathy   . Hyponatremia   . Hematuria 06/09/2019  . Acute renal failure (ARF) ([place]) 06/09/2019  . Acute blood loss anemia 06/09/2019  . Hyperkalemia 06/09/2019  . Metabolic acidosis 02/12/1116  . Left leg DVT ([place]) 05/18/2019  . Left leg swelling 05/11/2019  . Pre-operative cardiovascular examination 03/22/2019  . 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[place]) 07/11/2018  . Right arm weakness 07/21/2017  . Non compliance w medication regimen 07/30/2016  . 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[place]) 12/11/2015  . Mild cognitive impairment 12/11/2015  . 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 Healthcare maintenance 02/07/2013  . Iron deficiency anemia due to chronic blood loss 01/18/2013  . C O P D 04/08/2010  . Atherosclerosis of native artery of extremity with intermittent claudication ([place]) 11/26/2008  . Benign prostatic hyperplasia 10/20/2007  . PERIPHERAL VASCULAR DISEASE 05/19/2007  . Essential hypertension 02/01/2007  . Hyperlipidemia 01/03/2007   PCP:  [name], MD Pharmacy:   [id number] [place]Phone: [phone]: [phone]    Social Determinants of Health (SDOH) Interventions    Readmission Risk Interventions No flowsheet data found.

## 2019-07-04 NOTE — Progress Notes (Signed)
PROGRESS NOTE    Henry Carter  [id number] DOB: 03-02-1941 DOA: 06/29/2019 PCP: [name], MD    Brief Narrative: 79 y.o.malewith medical history significant forBPH with chronic indwelling Foley, hypertension, hyperlipidemia, PVD, anemia of iron deficiency and chronic blood loss, history of DVT with IVC filter placed (not on anticoagulation due to recent hematuria) who was recently admitted from 05/20/2019-06/15/2019 for acute renal failure secondary to obstructedFoley catheter. He was also noted to have anemia due to hematuria and was transfused 2 units PRBCs. His Xarelto was discontinued and an IVC filter was placed on 06/11/2019. Urine culture from 06/09/2019 grew corynebacterium species diphtheroids which was felt to be a skin contaminant. Patient followed up with urology as an outpatient and had his Foley catheter exchanged, subsequently started on oral ciprofloxacin for presumed UTI, which patient stated he took 1 dose PTA. Patient reported decreased amount of urine which was noted to be bloody with some suprapubic tenderness. In the ED, VSS, labs showed WBC 14.4, hemoglobin 9.4, bicarb 19, BUN 44, creatinine 4.76. UA showed 21-50 RBC, greater than 50 WBCs, few bacteria. Patient's catheter was flushed and the hospitalist service was consulted for further evaluation and management. Patient had turbid purulent thick urine drained from the catheter on 07/03/2019.  Assessment & Plan:   Principal Problem:   Acute renal failure (ARF) (HCC) Active Problems:   Hyperlipidemia   Essential hypertension   Benign prostatic hyperplasia   Iron deficiency anemia due to chronic blood loss   Chronic indwelling Foley catheter   History of DVT (deep vein thrombosis)   Obstruction of Foley catheter ([place])   AKI (acute kidney injury) ([place])   Acute renal failure-secondary to obstructed Foley catheter.  Patient also presented with gross hematuria and had thick turbid and purulent urine  output. AKI slowing improving UA could not be analyzed due to significant hematuria, UC showed no growth Renal ultrasound showed prostate enlargement, markedly irregular bladder wall and layering material posteriorly possibly blood clot.  Moderate to severe bilateral hydronephrosis Urology consulted, Foley irrigated every 6-8 hours with saline, start finasteride, patient to follow-up with Dr. Alyson Ingles as an outpatient.  Anemia of chronic blood loss/iron deficiency Hemoglobin slowly trending downwards Anemia panel showed iron 12, sats 4, ferritin 77, folate 10.2, vitamin B12, 291 Type and screen, transfuse if hemoglobin less than 7 Feraheme x1 dose on 07/01/19, start oral and vitamin B12 supplements Daily CBC  BPH with chronic indwelling Foley History of elevated PSA Ultrasound and biopsy has been scheduled as outpt Urology on board, await further recs pending disposition Start finasteride  Hypertension Continue home amlodipine  Hyperlipidemia Continue statins  History of DVT  Diagnosed on 05/11/2019, status post IVC filter placement on 06/11/2019 Previously on Xarelto, discontinued due to gross hematuria  4 cm infrarenal abdominal aortic aneurysm Seen on CT 06/09/2019 Follow-up ultrasound in 1 year   Estimated body mass index is 19.42 kg/m as calculated from the following:   Height as of this encounter: 5\' 10"  (1.778 m).   Weight as of this encounter: 61.4 kg.  DVT prophylaxis: SCD  code Status: Full code  family Communication: None at bedside  disposition Plan: Patient came from home Seen by physical therapy recommends SNF versus 24-hour care at home Wife unable to care for him at home Will plan for SNF discharge Barriers to discharge none Toc consulted.   Consultants:   Urology  Procedures: None Antimicrobials: Ciprofloxacin  Subjective: Patient is resting in bed he is awake and alert and appropriate responding  to questions and following commands Foley  catheter with dark urine  Objective: Vitals:   07/03/19 2035 07/03/19 2045 07/04/19 0530 07/04/19 0710  BP: (!) 145/86  (!) 159/87   Pulse: 97  90   Resp: 17  17   Temp: 98.5 F (36.9 C)  98.5 F (36.9 C)   TempSrc: Oral     SpO2: 95% 97% 100%   Weight:    61.4 kg  Height:        Intake/Output Summary (Last 24 hours) at 07/04/2019 0954 Last data filed at 07/04/2019 0800 Gross per 24 hour  Intake 130 ml  Output 5650 ml  Net -5520 ml   Filed Weights   07/02/19 0950 07/03/19 0527 07/04/19 0710  Weight: 60.2 kg 61.9 kg 61.4 kg    Examination:  General exam: Appears calm and comfortable  Respiratory system: Clear to auscultation. Respiratory effort normal. Cardiovascular system: S1 & S2 heard, RRR. No JVD, murmurs, rubs, gallops or clicks. No pedal edema. Gastrointestinal system: Abdomen is nondistended, soft and nontender. No organomegaly or masses felt. Normal bowel sounds heard. Central nervous system: Alert and oriented. No focal neurological deficits. Extremities: Symmetric 5 x 5 power. Skin: No rashes, lesions or ulcers Psychiatry: Judgement and insight appear normal. Mood & affect appropriate.     Data Reviewed: I have personally reviewed following labs and imaging studies  CBC: Recent Labs  Lab 06/29/19 1624 06/30/19 0533 07/01/19 0617 07/02/19 0533 07/02/19 1724 07/03/19 0511 07/04/19 0537  WBC 14.4*   < > 8.8 11.1* 10.3 10.7* 6.0  NEUTROABS 12.6*  --  7.7 9.4*  --  9.1* 4.7  HGB 9.4*   < > 8.0* 7.5* 8.0* 7.7* 7.7*  HCT 30.2*   < > 26.0* 24.1* 25.7* 24.4* 24.4*  MCV 87.5   < > 87.2 86.7 86.2 85.9 84.7  PLT 577*   < > 447* 399 424* 391 404*   < > = values in this interval not displayed.   Basic Metabolic Panel: Recent Labs  Lab 06/30/19 0533 07/01/19 0617 07/02/19 0533 07/03/19 0511 07/04/19 0537  NA 132* 134* 136 132* 133*  K 4.0 4.2 4.3 4.0 4.0  CL 106 108 110 106 105  CO2 18* 19* 17* 20* 21*  GLUCOSE 81 80 72 95 89  BUN 39* 34* 33* 21 16    CREATININE 3.01* 2.81* 2.51* 1.69* 1.29*  CALCIUM 8.0* 8.4* 8.4* 8.3* 8.1*   GFR: Estimated Creatinine Clearance: 41 mL/min (A) (by C-G formula based on SCr of 1.29 mg/dL (H)). Liver Function Tests: No results for input(s): AST, ALT, ALKPHOS, BILITOT, PROT, ALBUMIN in the last 168 hours. No results for input(s): LIPASE, AMYLASE in the last 168 hours. No results for input(s): AMMONIA in the last 168 hours. Coagulation Profile: No results for input(s): INR, PROTIME in the last 168 hours. Cardiac Enzymes: No results for input(s): CKTOTAL, CKMB, CKMBINDEX, TROPONINI in the last 168 hours. BNP (last 3 results) No results for input(s): PROBNP in the last 8760 hours. HbA1C: No results for input(s): HGBA1C in the last 72 hours. CBG: No results for input(s): GLUCAP in the last 168 hours. Lipid Profile: No results for input(s): CHOL, HDL, LDLCALC, TRIG, CHOLHDL, LDLDIRECT in the last 72 hours. Thyroid Function Tests: No results for input(s): TSH, T4TOTAL, FREET4, T3FREE, THYROIDAB in the last 72 hours. Anemia Panel: No results for input(s): VITAMINB12, FOLATE, FERRITIN, TIBC, IRON, RETICCTPCT in the last 72 hours. Sepsis Labs: No results for input(s): PROCALCITON, LATICACIDVEN in the last  168 hours.  Recent Results (from the past 240 hour(s))  Urine culture     Status: None   Collection Time: 06/29/19  4:24 PM   Specimen: Urine  Result Value Ref [place] Status   Specimen Description   Final    Urine Performed at [facility] [address] [place], [place] [address]    Special Requests   Final    NONE Performed at [facility], [place] [address] [place], [place] [address]    Culture   Final    NO GROWTH Performed at [facility], [place] [address] [place], [place] [address]    Report Status 06/30/2019 FINAL  Final  SARS CORONAVIRUS 2 (TAT 6-24 HRS) Nasopharyngeal Nasopharyngeal Swab     Status: None   Collection Time: 06/29/19  6:45 PM    Specimen: Nasopharyngeal Swab  Result Value Ref [place] Status   SARS Coronavirus 2 NEGATIVE NEGATIVE Final    Comment: (NOTE) SARS-CoV-2 target nucleic acids are NOT DETECTED. The SARS-CoV-2 RNA is generally detectable in upper and lower respiratory specimens during the acute phase of infection. Negative results do not preclude SARS-CoV-2 infection, do not rule out co-infections with other pathogens, and should not be used as the sole basis for treatment or other patient management decisions. Negative results must be combined with clinical observations, patient history, and epidemiological information. The expected result is Negative. Fact Sheet for Patients: SugarRoll.be Fact Sheet for Healthcare Providers: https://www.woods-.com/ This test is not yet approved or cleared by the [place] FDA and  has been authorized for detection and/or diagnosis of SARS-CoV-2 by FDA under an Emergency Use Authorization (EUA). This EUA will remain  in effect (meaning this test can be used) for the duration of the COVID-19 declaration under Section 56 4(b)(1) of the Act, 21 U.S.C. section 360bbb-3(b)(1), unless the authorization is terminated or revoked sooner. Performed at [facility], [place] [address] [place], [place] [address]   Blood culture (routine x 2)     Status: None   Collection Time: 06/29/19  6:45 PM   Specimen: Right Antecubital; Blood  Result Value Ref [place] Status   Specimen Description   Final    RIGHT ANTECUBITAL Performed at [facility] [address], [place], [place] [address]    Special Requests   Final    BOTTLES DRAWN AEROBIC AND ANAEROBIC Blood Culture adequate volume Performed at [facility] [address] [place], [place] [address]    Culture   Final    NO GROWTH 5 DAYS Performed at [facility], [place] [address], [place], [place] [address]    Report Status  07/04/2019 FINAL  Final  Blood culture (routine x 2)     Status: None   Collection Time: 06/29/19  6:45 PM   Specimen: BLOOD LEFT FOREARM  Result Value Ref [place] Status   Specimen Description   Final    BLOOD LEFT FOREARM Performed at [facility] [address], [place], [place] [address]    Special Requests   Final    BOTTLES DRAWN AEROBIC AND ANAEROBIC Blood Culture adequate volume Performed at [facility] [address], [place], [place] [address]    Culture   Final    NO GROWTH 5 DAYS Performed at [facility], [place] [address], [place],  [address]    Report Status 07/04/2019 FINAL  Final  Radiology Studies: No results found.      Scheduled Meds: . amLODipine  10 mg Oral Daily  . Chlorhexidine Gluconate Cloth  6 each Topical Daily  . ferrous sulfate  325 mg Oral Q breakfast  . finasteride  5 mg Oral Daily  . pantoprazole  40 mg Oral BID  . polyethylene glycol  17 g Oral Daily  . senna-docusate  1 tablet Oral QHS  . simvastatin  20 mg Oral QHS  . vitamin B-12  1,000 mcg Oral Daily   Continuous Infusions: . sodium chloride 75 mL/hr at 07/04/19 0552     LOS: 4 days     Georgette Shell, MD  07/04/2019, 9:54 AM

## 2019-07-05 LAB — BASIC METABOLIC PANEL
Anion gap: 9 (ref 5–15)
BUN: 14 mg/dL (ref 8–23)
CO2: 23 mmol/L (ref 22–32)
Calcium: 8.5 mg/dL — ABNORMAL LOW (ref 8.9–10.3)
Chloride: 102 mmol/L (ref 98–111)
Creatinine, Ser: 1.3 mg/dL — ABNORMAL HIGH (ref 0.61–1.24)
GFR calc Af Amer: >60 mL/min (ref >60)
GFR calc non Af Amer: 52 mL/min — ABNORMAL LOW (ref >60)
Glucose, Bld: 107 mg/dL — ABNORMAL HIGH (ref 70–99)
Potassium: 3.7 mmol/L (ref 3.5–5.1)
Sodium: 134 mmol/L — ABNORMAL LOW (ref 135–145)

## 2019-07-05 LAB — CBC WITH DIFFERENTIAL/PLATELET
Abs Immature Granulocytes: 0.18 10*3/uL — ABNORMAL HIGH (ref 0.00–0.07)
Basophils Absolute: 0 10*3/uL (ref 0.0–0.1)
Basophils Relative: 0 %
Eosinophils Absolute: 0.2 10*3/uL (ref 0.0–0.5)
Eosinophils Relative: 1 %
HCT: 29 % — ABNORMAL LOW (ref 39.0–52.0)
Hemoglobin: 9.1 g/dL — ABNORMAL LOW (ref 13.0–17.0)
Immature Granulocytes: 1 %
Lymphocytes Relative: 8 %
Lymphs Abs: 1 10*3/uL (ref 0.7–4.0)
MCH: 27.1 pg (ref 26.0–34.0)
MCHC: 31.4 g/dL (ref 30.0–36.0)
MCV: 86.3 fL (ref 80.0–100.0)
Monocytes Absolute: 0.6 10*3/uL (ref 0.1–1.0)
Monocytes Relative: 5 %
Neutro Abs: 10.6 10*3/uL — ABNORMAL HIGH (ref 1.7–7.7)
Neutrophils Relative %: 85 %
Platelets: 460 10*3/uL — ABNORMAL HIGH (ref 150–400)
RBC: 3.36 MIL/uL — ABNORMAL LOW (ref 4.22–5.81)
RDW: 16.9 % — ABNORMAL HIGH (ref 11.5–15.5)
WBC: 12.5 10*3/uL — ABNORMAL HIGH (ref 4.0–10.5)
nRBC: 0 % (ref 0.0–0.2)

## 2019-07-05 MED ORDER — BELLADONNA ALKALOIDS-OPIUM 16.2-60 MG RE SUPP
1.0000 | Freq: Three times a day (TID) | RECTAL | Status: DC | PRN
  Administered 2019-07-05: 1 via RECTAL
  Filled 2019-07-05 (×3): qty 1

## 2019-07-05 MED ORDER — POLYETHYLENE GLYCOL 3350 17 G PO PACK: 17.0000 g | PACK | Freq: Every day | ORAL | 0 refills | Status: DC

## 2019-07-05 MED ORDER — SENNOSIDES-DOCUSATE SODIUM 8.6-50 MG PO TABS: 1.0000 | ORAL_TABLET | Freq: Every day | ORAL | 1 refills | Status: DC

## 2019-07-05 MED ORDER — CYANOCOBALAMIN 1000 MCG PO TABS: 1000.0000 ug | ORAL_TABLET | Freq: Every day | ORAL | Status: DC

## 2019-07-05 MED ORDER — FINASTERIDE 5 MG PO TABS: 5.0000 mg | ORAL_TABLET | Freq: Every day | ORAL | 2 refills | Status: DC

## 2019-07-05 MED ORDER — FERROUS SULFATE 325 (65 FE) MG PO TABS: 325.0000 mg | ORAL_TABLET | Freq: Every day | ORAL | 3 refills | Status: DC

## 2019-07-05 NOTE — Progress Notes (Signed)
PROGRESS NOTE    Henry Carter  [id number] DOB: January 02, 1941 DOA: 06/29/2019 PCP: [name], MD   Brief Narrative:79 y.o.malewith medical history significant forBPH with chronic indwelling Foley, hypertension, hyperlipidemia, PVD, anemia of iron deficiency and chronic blood loss, history of DVT with IVC filter placed (not on anticoagulation due to recent hematuria) who was recently admitted from 05/20/2019-06/15/2019 for acute renal failure secondary to obstructedFoley catheter. He was also noted to have anemia due to hematuria and was transfused 2 units PRBCs. His Xarelto was discontinued and an IVC filter was placed on 06/11/2019. Urine culture from 06/09/2019 grew corynebacterium species diphtheroids which was felt to be a skin contaminant. Patient followed up with urology as an outpatient and had his Foley catheter exchanged, subsequently started on oral ciprofloxacin for presumed UTI, which patient stated he took 1 dose PTA. Patient reported decreased amount of urine which was noted to be bloody with some suprapubic tenderness. In the ED, VSS, labs showed WBC 14.4, hemoglobin 9.4, bicarb 19, BUN 44, creatinine 4.76. UA showed 21-50 RBC, greater than 50 WBCs, few bacteria. Patient's catheter was flushed and the hospitalist service was consulted for further evaluation and management. Patient had turbid purulent thick urine drained from the catheteron 07/03/2019  Assessment & Plan:   Principal Problem:   Acute renal failure (ARF) (HCC) Active Problems:   Hyperlipidemia   Essential hypertension   Benign prostatic hyperplasia   Iron deficiency anemia due to chronic blood loss   Chronic indwelling Foley catheter   History of DVT (deep vein thrombosis)   Obstruction of Foley catheter ([place])   AKI (acute kidney injury) ([place])   Acute renal failure-secondary to obstructed Foley catheter. Patient also presented with gross hematuria and had thick turbid and purulent urine  output. AKI slowing improving UA could not be analyzed due to significant hematuria, UC showed no growth Renal ultrasound showed prostate enlargement, markedly irregular bladder wall and layering material posteriorly possibly blood clot. Moderate to severe bilateral hydronephrosis Urology consulted, Foley irrigated every 6-8 hours with saline, start finasteride, patient to follow-up with Dr. Alyson Ingles as an outpatient.  Anemia of chronic blood loss/iron deficiency Hemoglobin slowly trending downwards Anemia panel showed iron 12, sats 4, ferritin 77, folate 10.2, vitamin B12, 291 Type and screen, transfuse if hemoglobin less than 7 Feraheme x1 dose on 07/01/19, start oral and vitamin B12 supplements Daily CBC  BPH with chronic indwelling Foley History of elevated PSA Ultrasound and biopsy has been scheduled as outpt Urology on board,await further recs pending disposition Start finasteride  Hypertension Continue home amlodipine  Hyperlipidemia Continue statins  History of DVT Diagnosed on 05/11/2019, status post IVC filter placement on 06/11/2019 Previously on Xarelto, discontinued due to gross hematuria  4 cm infrarenal abdominal aortic aneurysm Seen on CT 06/09/2019 Follow-up ultrasound in 1 year  Estimated body mass index is 18.41 kg/m as calculated from the following:   Height as of this encounter: 5\' 10"  (1.778 m).   Weight as of this encounter: 58.2 kg.  DVT prophylaxis: SCD  code Status: Full code  family Communication: None at bedside  disposition Plan: Patient came from home Seen by physical therapy recommends SNF versus 24-hour care at home Wife unable to care for him at home Will plan for SNF discharge Barriers to discharge none Toc consulted. Finally patient and his wife is agreed to go to SNF authorization in progress.   Consultants:   Urology  Procedures: None Antimicrobials: Ciprofloxacin   Subjective:   Objective: Vitals:  07/04/19  2159 07/05/19 0500 07/05/19 0610 07/05/19 1357  BP: 140/88  (!) 153/75 111/67  Pulse: 99  62 92  Resp: 18  17 18   Temp: 98.5 F (36.9 C)  98.7 F (37.1 C) 99.5 F (37.5 C)  TempSrc: Oral  Oral Oral  SpO2: 100%  99% 99%  Weight:  58.2 kg    Height:        Intake/Output Summary (Last 24 hours) at 07/05/2019 1424 Last data filed at 07/05/2019 0255 Gross per 24 hour  Intake 1102 ml  Output 2850 ml  Net -1748 ml   Filed Weights   07/03/19 0527 07/04/19 0710 07/05/19 0500  Weight: 61.9 kg 61.4 kg 58.2 kg    Examination:  General exam: Appears calm and comfortable  Respiratory system: Clear to auscultation. Respiratory effort normal. Cardiovascular system: S1 & S2 heard, RRR. No JVD, murmurs, rubs, gallops or clicks. No pedal edema. Gastrointestinal system: Abdomen is nondistended, soft and nontender. No organomegaly or masses felt. Normal bowel sounds heard. Central nervous system: Alert and oriented. No focal neurological deficits. Extremities: Symmetric 5 x 5 power. Skin: No rashes, lesions or ulcers Psychiatry: Judgement and insight appear normal. Mood & affect appropriate.     Data Reviewed: I have personally reviewed following labs and imaging studies  CBC: Recent Labs  Lab 07/01/19 0617 07/01/19 0617 07/02/19 0533 07/02/19 1724 07/03/19 0511 07/04/19 0537 07/05/19 0543  WBC 8.8   < > 11.1* 10.3 10.7* 6.0 12.5*  NEUTROABS 7.7  --  9.4*  --  9.1* 4.7 10.6*  HGB 8.0*   < > 7.5* 8.0* 7.7* 7.7* 9.1*  HCT 26.0*   < > 24.1* 25.7* 24.4* 24.4* 29.0*  MCV 87.2   < > 86.7 86.2 85.9 84.7 86.3  PLT 447*   < > 399 424* 391 404* 460*   < > = values in this interval not displayed.   Basic Metabolic Panel: Recent Labs  Lab 07/01/19 0617 07/02/19 0533 07/03/19 0511 07/04/19 0537 07/05/19 0543  NA 134* 136 132* 133* 134*  K 4.2 4.3 4.0 4.0 3.7  CL 108 110 106 105 102  CO2 19* 17* 20* 21* 23  GLUCOSE 80 72 95 89 107*  BUN 34* 33* 21 16 14   CREATININE 2.81* 2.51*  1.69* 1.29* 1.30*  CALCIUM 8.4* 8.4* 8.3* 8.1* 8.5*   GFR: Estimated Creatinine Clearance: 38.6 mL/min (A) (by C-G formula based on SCr of 1.3 mg/dL (H)). Liver Function Tests: No results for input(s): AST, ALT, ALKPHOS, BILITOT, PROT, ALBUMIN in the last 168 hours. No results for input(s): LIPASE, AMYLASE in the last 168 hours. No results for input(s): AMMONIA in the last 168 hours. Coagulation Profile: No results for input(s): INR, PROTIME in the last 168 hours. Cardiac Enzymes: No results for input(s): CKTOTAL, CKMB, CKMBINDEX, TROPONINI in the last 168 hours. BNP (last 3 results) No results for input(s): PROBNP in the last 8760 hours. HbA1C: No results for input(s): HGBA1C in the last 72 hours. CBG: No results for input(s): GLUCAP in the last 168 hours. Lipid Profile: No results for input(s): CHOL, HDL, LDLCALC, TRIG, CHOLHDL, LDLDIRECT in the last 72 hours. Thyroid Function Tests: No results for input(s): TSH, T4TOTAL, FREET4, T3FREE, THYROIDAB in the last 72 hours. Anemia Panel: No results for input(s): VITAMINB12, FOLATE, FERRITIN, TIBC, IRON, RETICCTPCT in the last 72 hours. Sepsis Labs: No results for input(s): PROCALCITON, LATICACIDVEN in the last 168 hours.  Recent Results (from the past 240 hour(s))  Urine culture  Status: None   Collection Time: 06/29/19  4:24 PM   Specimen: Urine  Result Value Ref Range Status   Specimen Description   Final    Urine Performed at [facility] [address], [place], [place] [address]    Special Requests   Final    NONE Performed at [facility], [place] [address], [place], [place] [address]    Culture   Final    NO GROWTH Performed at [facility], [place] [address], [place], [place] [address]    Report Status 06/30/2019 FINAL  Final  SARS CORONAVIRUS 2 (TAT 6-24 HRS) Nasopharyngeal Nasopharyngeal Swab     Status: None   Collection Time: 06/29/19  6:45 PM   Specimen:  Nasopharyngeal Swab  Result Value Ref Range Status   SARS Coronavirus 2 NEGATIVE NEGATIVE Final    Comment: (NOTE) SARS-CoV-2 target nucleic acids are NOT DETECTED. The SARS-CoV-2 RNA is generally detectable in upper and lower respiratory specimens during the acute phase of infection. Negative results do not preclude SARS-CoV-2 infection, do not rule out co-infections with other pathogens, and should not be used as the sole basis for treatment or other patient management decisions. Negative results must be combined with clinical observations, patient history, and epidemiological information. The expected result is Negative. Fact Sheet for Patients: SugarRoll.be Fact Sheet for Healthcare Providers: https://www.woods-.com/ This test is not yet approved or cleared by the [place] FDA and  has been authorized for detection and/or diagnosis of SARS-CoV-2 by FDA under an Emergency Use Authorization (EUA). This EUA will remain  in effect (meaning this test can be used) for the duration of the COVID-19 declaration under Section 56 4(b)(1) of the Act, 21 U.S.C. section 360bbb-3(b)(1), unless the authorization is terminated or revoked sooner. Performed at [facility], [place] [address] [place], [place] [address]   Blood culture (routine x 2)     Status: None   Collection Time: 06/29/19  6:45 PM   Specimen: Right Antecubital; Blood  Result Value Ref Range Status   Specimen Description   Final    RIGHT ANTECUBITAL Performed at [facility] [address], [place], [place] [address]    Special Requests   Final    BOTTLES DRAWN AEROBIC AND ANAEROBIC Blood Culture adequate volume Performed at [facility] [address] [place], [place] [address]    Culture   Final    NO GROWTH 5 DAYS Performed at [facility], [place] [address] [place], [place] [address]    Report Status 07/04/2019 FINAL   Final  Blood culture (routine x 2)     Status: None   Collection Time: 06/29/19  6:45 PM   Specimen: BLOOD LEFT FOREARM  Result Value Ref Range Status   Specimen Description   Final    BLOOD LEFT FOREARM Performed at [facility] [address] [place], [place] [address]    Special Requests   Final    BOTTLES DRAWN AEROBIC AND ANAEROBIC Blood Culture adequate volume Performed at [facility] [address] [place], [place] [address]    Culture   Final    NO GROWTH 5 DAYS Performed at [facility], [place] [address], [place],  [address]    Report Status 07/04/2019 FINAL  Final  SARS CORONAVIRUS 2 (TAT 6-24 HRS) Nasopharyngeal Nasopharyngeal Swab     Status: None   Collection Time: 07/04/19  4:40 PM  Specimen: Nasopharyngeal Swab  Result Value Ref Range Status   SARS Coronavirus 2 NEGATIVE NEGATIVE Final    Comment: (NOTE) SARS-CoV-2 target nucleic acids are NOT DETECTED. The SARS-CoV-2 RNA is generally detectable in upper and lower respiratory specimens during the acute phase of infection. Negative results do not preclude SARS-CoV-2 infection, do not rule out co-infections with other pathogens, and should not be used as the sole basis for treatment or other patient management decisions. Negative results must be combined with clinical observations, patient history, and epidemiological information. The expected result is Negative. Fact Sheet for Patients: SugarRoll.be Fact Sheet for Healthcare Providers: https://www.woods-[place]illa [place]man.com/ This test is not yet approved or cleared by the [place] FDA and  has been authorized for detection and/or diagnosis of SARS-CoV-2 by FDA under an Emergency Use Authorization (EUA). This EUA will remain  in effect (meaning this test can be used) for the duration of the COVID-19 declaration under Section 56 4(b)(1) of the Act, 21 U.S.C. section  360bbb-3(b)(1), unless the authorization is terminated or revoked sooner. Performed at [facility], [place] [address], [place], [place] [address]          Radiology Studies: No results found.      Scheduled Meds: . amLODipine  10 mg Oral Daily  . Chlorhexidine Gluconate Cloth  6 each Topical Daily  . ferrous sulfate  325 mg Oral Q breakfast  . finasteride  5 mg Oral Daily  . pantoprazole  40 mg Oral BID  . polyethylene glycol  17 g Oral Daily  . senna-docusate  1 tablet Oral QHS  . simvastatin  20 mg Oral QHS  . vitamin B-12  1,000 mcg Oral Daily   Continuous Infusions: . sodium chloride 75 mL/hr at 07/04/19 1916     LOS: 5 days     Georgette Shell, MD  07/05/2019, 2:24 PM

## 2019-07-05 NOTE — Progress Notes (Signed)
Subjective: Urine is clear this morning. The patient is having leakage around the foley catheter. The foley was flushed and the foley was occluded by sediment. I irrigate the foley and it began draining well. No suprapubic pain Objective: Vital signs in last 24 hours: Temp:  [98.2 F (36.8 C)-98.7 F (37.1 C)] 98.7 F (37.1 C) (03/25 0610) Pulse Rate:  [62-99] 62 (03/25 0610) Resp:  [17-18] 17 (03/25 0610) BP: (120-153)/(75-88) 153/75 (03/25 0610) SpO2:  [99 %-100 %] 99 % (03/25 0610) Weight:  [58.2 kg] 58.2 kg (03/25 0500)  Intake/Output from previous day: 03/24 0701 - 03/25 0700 In: 1880 [P.O.:1770] Out: 5550 [Urine:5550] Intake/Output this shift: No intake/output data recorded.  Physical Exam:  Constitutional: Vital signs reviewed. WD WN in NAD   Eyes: PERRL, No scleral icterus.   Cardiovascular: RRR Pulmonary/Chest: Normal effort Extremities: No cyanosis or edema   Lab Results: Recent Labs    07/03/19 0511 07/04/19 0537 07/05/19 0543  HGB 7.7* 7.7* 9.1*  HCT 24.4* 24.4* 29.0*   BMET Recent Labs    07/04/19 0537 07/05/19 0543  NA 133* 134*  K 4.0 3.7  CL 105 102  CO2 21* 23  GLUCOSE 89 107*  BUN 16 14  CREATININE 1.29* 1.30*  CALCIUM 8.1* 8.5*   No results for input(s): LABPT, INR in the last 72 hours. No results for input(s): LABURIN in the last 72 hours. Results for orders placed or performed during the hospital encounter of 06/29/19  Urine culture     Status: None   Collection Time: 06/29/19  4:24 PM   Specimen: Urine  Result Value Ref Range Status   Specimen Description   Final    Urine Performed at [facility] [address] [place], [place] [address]    Special Requests   Final    NONE Performed at [facility], [place] [address], [place], [place] [address]    Culture   Final    NO GROWTH Performed at [facility], [place] [address] [place], [place] [address]    Report Status 06/30/2019  FINAL  Final  SARS CORONAVIRUS 2 (TAT 6-24 HRS) Nasopharyngeal Nasopharyngeal Swab     Status: None   Collection Time: 06/29/19  6:45 PM   Specimen: Nasopharyngeal Swab  Result Value Ref Range Status   SARS Coronavirus 2 NEGATIVE NEGATIVE Final    Comment: (NOTE) SARS-CoV-2 target nucleic acids are NOT DETECTED. The SARS-CoV-2 RNA is generally detectable in upper and lower respiratory specimens during the acute phase of infection. Negative results do not preclude SARS-CoV-2 infection, do not rule out co-infections with other pathogens, and should not be used as the sole basis for treatment or other patient management decisions. Negative results must be combined with clinical observations, patient history, and epidemiological information. The expected result is Negative. Fact Sheet for Patients: SugarRoll.be Fact Sheet for Healthcare Providers: https://www.woods-mathews.com/ This test is not yet approved or cleared by the [place] FDA and  has been authorized for detection and/or diagnosis of SARS-CoV-2 by FDA under an Emergency Use Authorization (EUA). This EUA will remain  in effect (meaning this test can be used) for the duration of the COVID-19 declaration under Section 56 4(b)(1) of the Act, 21 U.S.C. section 360bbb-3(b)(1), unless the authorization is terminated or revoked sooner. Performed at [facility], [place] [address], [place], [place] [address]   Blood culture (routine x 2)     Status: None   Collection Time:  06/29/19  6:45 PM   Specimen: Right Antecubital; Blood  Result Value Ref Range Status   Specimen Description   Final    RIGHT ANTECUBITAL Performed at [facility], [place] [address], [place], [place] [address]    Special Requests   Final    BOTTLES DRAWN AEROBIC AND ANAEROBIC Blood Culture adequate volume Performed at [facility] [address], [place], [place] [address]     Culture   Final    NO GROWTH 5 DAYS Performed at [facility], [place] [address] [place], [place] [address]    Report Status 07/04/2019 FINAL  Final  Blood culture (routine x 2)     Status: None   Collection Time: 06/29/19  6:45 PM   Specimen: BLOOD LEFT FOREARM  Result Value Ref Range Status   Specimen Description   Final    BLOOD LEFT FOREARM Performed at [facility] [address], [place], [place] [address]    Special Requests   Final    BOTTLES DRAWN AEROBIC AND ANAEROBIC Blood Culture adequate volume Performed at [facility] [address] [place], [place] [address]    Culture   Final    NO GROWTH 5 DAYS Performed at [facility], [place] [address], [place], [place] [address]    Report Status 07/04/2019 FINAL  Final  SARS CORONAVIRUS 2 (TAT 6-24 HRS) Nasopharyngeal Nasopharyngeal Swab     Status: None   Collection Time: 07/04/19  4:40 PM   Specimen: Nasopharyngeal Swab  Result Value Ref Range Status   SARS Coronavirus 2 NEGATIVE NEGATIVE Final    Comment: (NOTE) SARS-CoV-2 target nucleic acids are NOT DETECTED. The SARS-CoV-2 RNA is generally detectable in upper and lower respiratory specimens during the acute phase of infection. Negative results do not preclude SARS-CoV-2 infection, do not rule out co-infections with other pathogens, and should not be used as the sole basis for treatment or other patient management decisions. Negative results must be combined with clinical observations, patient history, and epidemiological information. The expected result is Negative. Fact Sheet for Patients: SugarRoll.be Fact Sheet for Healthcare Providers: https://www.woods-mathews.com/ This test is not yet approved or cleared by the [place] FDA and  has been authorized for detection and/or diagnosis of SARS-CoV-2 by FDA under an Emergency Use Authorization (EUA). This EUA will remain   in effect (meaning this test can be used) for the duration of the COVID-19 declaration under Section 56 4(b)(1) of the Act, 21 U.S.C. section 360bbb-3(b)(1), unless the authorization is terminated or revoked sooner. Performed at [facility], [place] [address] [place], [place] [address]     Studies/Results: No results found.   Assessment/Plan: 79 year old African-American male with a large prostate and chronic indwelling Foley catheter who now is finally draining clear. He can be discharged home with his Foley catheter and then scheduled for follow-up in our clinic to see Dr. Alyson Ingles. He will be scheduled for TURP as an outpatient   LOS: 5 days   Nicolette Bang 07/05/2019, 1:30 PM

## 2019-07-05 NOTE — Progress Notes (Signed)
B&O suppository administered. RN noted pt continues to have leaking at site of foley cath insertion. Dr. Alyson Ingles (urologist) paged.

## 2019-07-05 NOTE — Discharge Summary (Addendum)
Physician Discharge Summary  Henry Carter [id number] DOB: December 20, 1940 DOA: 06/29/2019  PCP: [name], MD  Admit date: 06/29/2019 Discharge date: 07/06/2019 Admitted From: Home Disposition: Home  Recommendations for Outpatient Follow-up:  1. Follow up with PCP in 1-2 weeks 2. Please obtain BMP/CBC in one week 3. Please follow up with Dr. Aline August urologist please call for appointment  Home Health yes Equipment/Devices: Rolling walker with 5 inch wheels  Discharge Condition stable and improved CODE STATUS: Full code  diet recommendation: Cardiac diet  Brief/Interim Summary:79 y.o.malewith medical history significant forBPH with chronic indwelling Foley, hypertension, hyperlipidemia, PVD, anemia of iron deficiency and chronic blood loss, history of DVT with IVC filter placed (not on anticoagulation due to recent hematuria) who was recently admitted from 05/20/2019-06/15/2019 for acute renal failure secondary to obstructedFoley catheter. He was also noted to have anemia due to hematuria and was transfused 2 units PRBCs. His Xarelto was discontinued and an IVC filter was placed on 06/11/2019. Urine culture from 06/09/2019 grew corynebacterium species diphtheroids which was felt to be a skin contaminant. Patient followed up with urology as an outpatient and had his Foley catheter exchanged, subsequently started on oral ciprofloxacin for presumed UTI, which patient stated he took 1 dose PTA. Patient reported decreased amount of urine which was noted to be bloody with some suprapubic tenderness. In the ED, VSS, labs showed WBC 14.4, hemoglobin 9.4, bicarb 19, BUN 44, creatinine 4.76. UA showed 21-50 RBC, greater than 50 WBCs, few bacteria. Patient's catheter was flushed and the hospitalist service was consulted for further evaluation and management. Patient had turbid purulent thick urine drained from the catheter on 07/03/2019   Discharge Diagnoses:  Principal Problem:   Acute  renal failure (ARF) ([place]) Active Problems:   Hyperlipidemia   Essential hypertension   Benign prostatic hyperplasia   Iron deficiency anemia due to chronic blood loss   Chronic indwelling Foley catheter   History of DVT (deep vein thrombosis)   Obstruction of Foley catheter ([place])   AKI (acute kidney injury) ([place])  Acute renal failure-secondary to obstructed Foley catheter.  Patient also presented with gross hematuria and had thick turbid and purulent urine output. AKI slowing improving UA could not be analyzed due to significant hematuria, UC showed no growth Renal ultrasound showed prostate enlargement, markedly irregular bladder wall and layering material posteriorly possibly blood clot. Moderate to severe bilateral hydronephrosis Urology consulted, Foley irrigated every 6-8 hours with saline, start finasteride, patient to follow-up with Dr. Alyson Ingles as an outpatient.  Anemia of chronic blood loss/iron deficiency Hemoglobin slowly trending downwards Anemia panel showed iron 12, sats 4, ferritin 77, folate 10.2, vitamin B12, 291 Type and screen, transfuse if hemoglobin less than 7 Feraheme x1 dose on 07/01/19, start oral and vitamin B12 supplements Daily CBC  BPH with chronic indwelling Foley History of elevated PSA Ultrasound and biopsy has been scheduled as outpt Urology on board,await further recs pending disposition Start finasteride  Hypertension Continue home amlodipine  Hyperlipidemia Continue statins  History of DVT Diagnosed on 05/11/2019, status post IVC filter placement on 06/11/2019 Previously on Xarelto, discontinued due to gross hematuria  4 cm infrarenal abdominal aortic aneurysm Seen on CT 06/09/2019 Follow-up ultrasound in 1 year Estimated body mass index is 18.41 kg/m as calculated from the following:   Height as of this encounter: 5\' 10"  (1.778 m).   Weight as of this encounter: 58.2 kg.  Discharge Instructions  Discharge Instructions    Diet  - low sodium heart healthy  Complete by: As directed    Increase activity slowly   Complete by: As directed      Allergies as of 07/05/2019      Reactions   Ace Inhibitors    REACTION: angioedema   Penicillins Hives   Has patient had a PCN reaction causing immediate rash, facial/tongue/throat swelling, SOB or lightheadedness with hypotension: No Has patient had a PCN reaction causing severe rash involving mucus membranes or skin necrosis: No Has patient had a PCN reaction that required hospitalization yes Has patient had a PCN reaction occurring within the last 10 years: no If all of the above answers are "NO", then may proceed with Cephalosporin use.      Medication List    TAKE these medications   amLODipine 10 MG tablet Commonly known as: NORVASC Take 1 tablet (10 mg total) by mouth daily.   ciprofloxacin 250 MG tablet Commonly known as: CIPRO Take 250 mg by mouth 2 (two) times daily. Starting 06/27/2019 for 7 days.   cyanocobalamin 1000 MCG tablet Take 1 tablet (1,000 mcg total) by mouth daily. Start taking on: July 06, 2019   ferrous sulfate 325 (65 FE) MG tablet Take 1 tablet (325 mg total) by mouth daily with breakfast. Start taking on: July 06, 2019   finasteride 5 MG tablet Commonly known as: PROSCAR Take 1 tablet (5 mg total) by mouth daily. Start taking on: July 06, 2019   pantoprazole 40 MG tablet Commonly known as: PROTONIX Take 1 tablet (40 mg total) by mouth 2 (two) times daily.   polyethylene glycol 17 g packet Commonly known as: MIRALAX / GLYCOLAX Take 17 g by mouth daily. Start taking on: July 06, 2019   senna-docusate 8.6-50 MG tablet Commonly known as: Senokot-S Take 1 tablet by mouth at bedtime.   simvastatin 20 MG tablet Commonly known as: ZOCOR Take 1 tablet (20 mg total) by mouth at bedtime.            Durable Medical Equipment  (From admission, onward)         Start     Ordered   07/05/19 1217  DME Walker  Once     Question Answer Comment  Walker: With 5 Inch Wheels   Patient needs a walker to treat with the following condition Unsteady gait      07/05/19 1216         Follow-up Information    McKenzie, Candee Furbish, MD.   Specialty: Urology Why: we will call you to schedule followup appt Contact information: [place] [address] [phone]          Allergies  Allergen Reactions  . Ace Inhibitors     REACTION: angioedema  . Penicillins Hives    Has patient had a PCN reaction causing immediate rash, facial/tongue/throat swelling, SOB or lightheadedness with hypotension: No Has patient had a PCN reaction causing severe rash involving mucus membranes or skin necrosis: No Has patient had a PCN reaction that required hospitalization yes Has patient had a PCN reaction occurring within the last 10 years: no If all of the above answers are "NO", then may proceed with Cephalosporin use.    Consultations: UROLOGY  Procedures/Studies: CT ABDOMEN PELVIS WO CONTRAST  Result Date: 06/09/2019 CLINICAL DATA:  Gross hematuria EXAM: CT ABDOMEN AND PELVIS WITHOUT CONTRAST TECHNIQUE: Multidetector CT imaging of the abdomen and pelvis was performed following the standard protocol without IV contrast. COMPARISON:  None. FINDINGS: Lower chest: No acute pleural or parenchymal  lung disease. Hepatobiliary: Gallbladder is decompressed which limits its evaluation. Unenhanced imaging of the liver is unremarkable. Pancreas: Unremarkable. No pancreatic ductal dilatation or surrounding inflammatory changes. Spleen: Normal in size without focal abnormality. Adrenals/Urinary Tract: There is bilateral obstructive uropathy, left greater than right. I do not see an obstructing calculus. Distension of the ureters and renal collecting systems could be due to chronic bladder outlet obstruction given the degree of bladder distention on this exam. Diffuse mural calcifications are seen throughout the bladder. No  definite bladder wall mass or filling defect. Foley catheter is seen within the bladder lumen. The adrenals are unremarkable. Stomach/Bowel: No bowel obstruction or ileus. Normal appendix right lower quadrant. No wall thickening or inflammatory changes. Vascular/Lymphatic: There is aneurysmal dilatation of the infrarenal abdominal aorta, measuring up to 4 cm in anterior-posterior dimension. Diffuse calcified atheromatous plaque throughout the aorta and its branches. No pathologic adenopathy within the abdomen or pelvis. Reproductive: Prostate is enlarged, measuring 5.4 x 5.4 cm in transverse direction. Other: There is trace free fluid in the pelvis, nonspecific. Perirectal fat stranding is noted, also nonspecific. Musculoskeletal: No acute or destructive bony lesions. Reconstructed images demonstrate no additional findings. IMPRESSION: 1. Bilateral obstructive uropathy, left greater than right, which may be due to bladder distension and chronic bladder outlet obstruction. No evidence of urinary tract calculi. 2. Distended bladder despite Foley catheter. Please correlate with catheter function. Diffuse mural calcifications of the bladder without filling defect or mass. 3. Enlarged prostate. 4. 4 cm infrarenal abdominal aortic aneurysm. Recommend followup by ultrasound in 1 year. This recommendation follows ACR consensus guidelines: White Paper of the ACR Incidental Findings Committee II on Vascular Findings. J Am Coll Radiol 2013; 10:789-794. Aortic aneurysm NOS (ICD10-I71.9) 5. Trace pelvic free fluid. 6. Perirectal fat stranding of uncertain significance. Electronically Signed   By: Randa Ngo M.D.   On: 06/09/2019 21:29   CT Head Wo Contrast  Result Date: 06/09/2019 CLINICAL DATA:  Urinary tract infection, anorexia EXAM: CT HEAD WITHOUT CONTRAST TECHNIQUE: Contiguous axial images were obtained from the base of the skull through the vertex without intravenous contrast. COMPARISON:  12/30/2017 FINDINGS:  Brain: Hypodensities throughout the periventricular white matter compatible with stable chronic small vessel ischemic changes. No signs of acute infarct or hemorrhage. Stable prominence of the lateral ventricles. Remaining midline structures are unremarkable. No acute extra-axial fluid collections. No mass effect. Vascular: No hyperdense vessel or unexpected calcification. Skull: Normal. Negative for fracture or focal lesion. Sinuses/Orbits: No acute finding. Other: None IMPRESSION: 1. Stable chronic small vessel ischemic changes in the white matter. No acute intracranial process. 2. Stable prominence of the lateral ventricles out of proportion to the degree of cerebral atrophy, compatible with normal pressure hydrocephalus. Electronically Signed   By: Randa Ngo M.D.   On: 06/09/2019 19:29   US RENAL  Result Date: 06/29/2019 CLINICAL DATA:  Acute renal failure EXAM: RENAL / URINARY TRACT ULTRASOUND COMPLETE COMPARISON:  None. FINDINGS: Right Kidney: Renal measurements: 11.9 x 5.0 x 5.8 cm = volume: 180 mL. Moderate right hydronephrosis. At least 2 small cysts, the largest measuring 1.3 cm in the lower pole. Normal echotexture. Left Kidney: Renal measurements: 13.0 x 5.6 x 6.6 cm = volume: 252 mL. 4.9 cm lower pole cyst. Severe left hydronephrosis. Normal echotexture. Bladder: Irregular bladder wall thickening. Foley catheter is in place and prostate is enlarged. Layering echogenic material posteriorly could reflect blood clot. Other: None. IMPRESSION: Prostate enlargement. Markedly irregular bladder wall and layering material posteriorly, possibly blood clot. Moderate  to severe bilateral hydronephrosis. Electronically Signed   By: Rolm Baptise M.D.   On: 06/29/2019 21:01   PERIPHERAL VASCULAR CATHETERIZATION  Result Date: 06/11/2019 Patient name: Jaethan Duel MRN: [id number] DOB: Nov 14, 1940 Sex: male 06/11/2019 Pre-operative Diagnosis: dvt with contraindication to anticoagulation Post-operative  diagnosis:  Same Surgeon:  Eda Paschal. Donzetta Matters, MD Procedure Performed: 1.  Ultrasound-guided cannulation right common femoral vein 2.  IVC venogram 3.  Placement of Cook Celect filter Indications: 79 year old male with extensive left lower extremity DVT.  He has hematuria now is indicated for IVC filter placement. Findings: IVC was patent less than 30 mm in diameter.  Filter was placed in the infrarenal position.  Procedure:  The patient was identified in the holding area and taken to room 8.  The patient was then placed supine on the table and prepped and draped in the usual sterile fashion.  A time out was called.  Ultrasound was used to evaluate the right common femoral vein which was patent and compressible.  The area was anesthetized 1% lidocaine cannulated with direct ultrasound visualization with 18-gauge needle.  Images saved the permanent record.  Bentson wires placed centrally under fluoroscopic guidance.  The introducer sheath was placed.  Central venogram was performed with the above findings.  We then placed a filter just below the lowest right renal vein and deployed it.  Sheath and wire were removed.  He tolerated procedure without any complication. Contrast: 30 cc Brandon C. Donzetta Matters, MD [facility] Office: [phone] Pager: [phone]  DG Chest Portable 1 View  Result Date: 06/09/2019 CLINICAL DATA:  79 year old male with sepsis. EXAM: PORTABLE CHEST 1 VIEW COMPARISON:  Chest radiograph dated 01/13/2015 and CT dated 01/07/2012. FINDINGS: Background of emphysema. No focal consolidation, pleural effusion, pneumothorax. The cardiac silhouette is within normal limits. Atherosclerotic calcification of the aorta. No acute osseous pathology. Upper abdominal surgical suture. IMPRESSION: 1. No acute cardiopulmonary process. 2. Emphysema. Electronically Signed   By: Anner Crete M.D.   On: 06/09/2019 17:47    (Echo, Carotid, EGD, Colonoscopy, ERCP)     Subjective:  Patient resting in bed in no acute distress anxious to go home Discharge Exam: Vitals:   07/04/19 2159 07/05/19 0610  BP: 140/88 (!) 153/75  Pulse: 99 62  Resp: 18 17  Temp: 98.5 F (36.9 C) 98.7 F (37.1 C)  SpO2: 100% 99%   Vitals:   07/04/19 1340 07/04/19 2159 07/05/19 0500 07/05/19 0610  BP: 120/75 140/88  (!) 153/75  Pulse: 90 99  62  Resp: 18 18  17   Temp: 98.2 F (36.8 C) 98.5 F (36.9 C)  98.7 F (37.1 C)  TempSrc: Oral Oral  Oral  SpO2: 100% 100%  99%  Weight:   58.2 kg   Height:       Foley in place  General: Pt is alert, awake, not in acute distress Cardiovascular: RRR, S1/S2 +, no rubs, no gallops Respiratory: CTA bilaterally, no wheezing, no rhonchi Abdominal: Soft, NT, ND, bowel sounds + Extremities: no edema, no cyanosis    The results of significant diagnostics from this hospitalization (including imaging, microbiology, ancillary and laboratory) are listed below for reference.     Microbiology: Recent Results (from the past 240 hour(s))  Urine culture     Status: None   Collection Time: 06/29/19  4:24 PM   Specimen: Urine  Result Value Ref Range Status   Specimen Description   Final    Urine Performed at [facility],  [place] [address] [place], [place] [address]    Special Requests   Final    NONE Performed at [facility], [place] [address] [place], [place] [address]    Culture   Final    NO GROWTH Performed at [facility], [place] [address], [place], [place] [address]    Report Status 06/30/2019 FINAL  Final  SARS CORONAVIRUS 2 (TAT 6-24 HRS) Nasopharyngeal Nasopharyngeal Swab     Status: None   Collection Time: 06/29/19  6:45 PM   Specimen: Nasopharyngeal Swab  Result Value Ref Range Status   SARS Coronavirus 2 NEGATIVE NEGATIVE Final    Comment: (NOTE) SARS-CoV-2 target nucleic acids are NOT DETECTED. The SARS-CoV-2 RNA is generally detectable in upper and lower respiratory  specimens during the acute phase of infection. Negative results do not preclude SARS-CoV-2 infection, do not rule out co-infections with other pathogens, and should not be used as the sole basis for treatment or other patient management decisions. Negative results must be combined with clinical observations, patient history, and epidemiological information. The expected result is Negative. Fact Sheet for Patients: SugarRoll.be Fact Sheet for Healthcare Providers: https://www.woods-.com/ This test is not yet approved or cleared by the [place] FDA and  has been authorized for detection and/or diagnosis of SARS-CoV-2 by FDA under an Emergency Use Authorization (EUA). This EUA will remain  in effect (meaning this test can be used) for the duration of the COVID-19 declaration under Section 56 4(b)(1) of the Act, 21 U.S.C. section 360bbb-3(b)(1), unless the authorization is terminated or revoked sooner. Performed at [facility], [place] [address] [place], [place] [address]   Blood culture (routine x 2)     Status: None   Collection Time: 06/29/19  6:45 PM   Specimen: Right Antecubital; Blood  Result Value Ref Range Status   Specimen Description   Final    RIGHT ANTECUBITAL Performed at [place] [address], [place], [place] [address]    Special Requests   Final    BOTTLES DRAWN AEROBIC AND ANAEROBIC Blood Culture adequate volume Performed at [place] [address], [place], [place] [address]    Culture   Final    NO GROWTH 5 DAYS Performed at [facility], [place] [address] [place], [place] [address]    Report Status 07/04/2019 FINAL  Final  Blood culture (routine x 2)     Status: None   Collection Time: 06/29/19  6:45 PM   Specimen: BLOOD LEFT FOREARM  Result Value Ref Range Status   Specimen Description   Final    BLOOD LEFT FOREARM Performed at [place] [address], [place], [place] [address]    Special Requests   Final    BOTTLES DRAWN AEROBIC AND ANAEROBIC Blood Culture adequate volume Performed at [place] [address] [place], [place] [address]    Culture   Final    NO GROWTH 5 DAYS Performed at [facility], [place] [address], [place], [place] [address]    Report Status 07/04/2019 FINAL  Final  SARS CORONAVIRUS 2 (TAT 6-24 HRS) Nasopharyngeal Nasopharyngeal Swab     Status: None   Collection Time: 07/04/19  4:40 PM   Specimen: Nasopharyngeal Swab  Result Value Ref Range Status   SARS Coronavirus 2 NEGATIVE NEGATIVE Final    Comment: (NOTE) SARS-CoV-2 target nucleic acids are NOT DETECTED. The SARS-CoV-2 RNA is generally detectable  in upper and lower respiratory specimens during the acute phase of infection. Negative results do not preclude SARS-CoV-2 infection, do not rule out co-infections with other pathogens, and should not be used as the sole basis for treatment or other patient management decisions. Negative results must be combined with clinical observations, patient history, and epidemiological information. The expected result is Negative. Fact Sheet for Patients: SugarRoll.be Fact Sheet for Healthcare Providers: https://www.woods-.com/ This test is not yet approved or cleared by the [place] FDA and  has been authorized for detection and/or diagnosis of SARS-CoV-2 by FDA under an Emergency Use Authorization (EUA). This EUA will remain  in effect (meaning this test can be used) for the duration of the COVID-19 declaration under Section 56 4(b)(1) of the Act, 21 U.S.C. section 360bbb-3(b)(1), unless the authorization is terminated or revoked sooner. Performed at [facility], [place] [address], [place], [place] [address]      Labs: BNP (last 3 results) No results for input(s): BNP in the last 8760  hours. Basic Metabolic Panel: Recent Labs  Lab 07/01/19 0617 07/02/19 0533 07/03/19 0511 07/04/19 0537 07/05/19 0543  NA 134* 136 132* 133* 134*  K 4.2 4.3 4.0 4.0 3.7  CL 108 110 106 105 102  CO2 19* 17* 20* 21* 23  GLUCOSE 80 72 95 89 107*  BUN 34* 33* 21 16 14   CREATININE 2.81* 2.51* 1.69* 1.29* 1.30*  CALCIUM 8.4* 8.4* 8.3* 8.1* 8.5*   Liver Function Tests: No results for input(s): AST, ALT, ALKPHOS, BILITOT, PROT, ALBUMIN in the last 168 hours. No results for input(s): LIPASE, AMYLASE in the last 168 hours. No results for input(s): AMMONIA in the last 168 hours. CBC: Recent Labs  Lab 07/01/19 0617 07/01/19 0617 07/02/19 0533 07/02/19 1724 07/03/19 0511 07/04/19 0537 07/05/19 0543  WBC 8.8   < > 11.1* 10.3 10.7* 6.0 12.5*  NEUTROABS 7.7  --  9.4*  --  9.1* 4.7 10.6*  HGB 8.0*   < > 7.5* 8.0* 7.7* 7.7* 9.1*  HCT 26.0*   < > 24.1* 25.7* 24.4* 24.4* 29.0*  MCV 87.2   < > 86.7 86.2 85.9 84.7 86.3  PLT 447*   < > 399 424* 391 404* 460*   < > = values in this interval not displayed.   Cardiac Enzymes: No results for input(s): CKTOTAL, CKMB, CKMBINDEX, TROPONINI in the last 168 hours. BNP: Invalid input(s): POCBNP CBG: No results for input(s): GLUCAP in the last 168 hours. D-Dimer No results for input(s): DDIMER in the last 72 hours. Hgb A1c No results for input(s): HGBA1C in the last 72 hours. Lipid Profile No results for input(s): CHOL, HDL, LDLCALC, TRIG, CHOLHDL, LDLDIRECT in the last 72 hours. Thyroid function studies No results for input(s): TSH, T4TOTAL, T3FREE, THYROIDAB in the last 72 hours.  Invalid input(s): FREET3 Anemia work up No results for input(s): VITAMINB12, FOLATE, FERRITIN, TIBC, IRON, RETICCTPCT in the last 72 hours. Urinalysis    Component Value Date/Time   COLORURINE BROWN (A) 06/29/2019 1624   APPEARANCEUR TURBID (A) 06/29/2019 1624   LABSPEC NOT CALCULATED 06/29/2019 1624   PHURINE NOT CALCULATED 06/29/2019 1624   GLUCOSEU (A)  06/29/2019 1624    TEST NOT REPORTED DUE TO COLOR INTERFERENCE OF URINE PIGMENT   HGBUR (A) 06/29/2019 1624    TEST NOT REPORTED DUE TO COLOR INTERFERENCE OF URINE PIGMENT   BILIRUBINUR (A) 06/29/2019 1624    TEST NOT REPORTED DUE TO COLOR INTERFERENCE OF URINE PIGMENT   BILIRUBINUR  negative 08/01/2017 1649   KETONESUR (A) 06/29/2019 1624    TEST NOT REPORTED DUE TO COLOR INTERFERENCE OF URINE PIGMENT   PROTEINUR (A) 06/29/2019 1624    TEST NOT REPORTED DUE TO COLOR INTERFERENCE OF URINE PIGMENT   UROBILINOGEN negative (A) 08/01/2017 1649   NITRITE (A) 06/29/2019 1624    TEST NOT REPORTED DUE TO COLOR INTERFERENCE OF URINE PIGMENT   LEUKOCYTESUR (A) 06/29/2019 1624    TEST NOT REPORTED DUE TO COLOR INTERFERENCE OF URINE PIGMENT   Sepsis Labs Invalid input(s): PROCALCITONIN,  WBC,  LACTICIDVEN Microbiology Recent Results (from the past 240 hour(s))  Urine culture     Status: None   Collection Time: 06/29/19  4:24 PM   Specimen: Urine  Result Value Ref Range Status   Specimen Description   Final    Urine Performed at [facility], [place] [address], [place], [place] [address]    Special Requests   Final    NONE Performed at [facility], [place] [address], [place], [place] [address]    Culture   Final    NO GROWTH Performed at [facility], [place] [address], [place], [place] [address]    Report Status 06/30/2019 FINAL  Final  SARS CORONAVIRUS 2 (TAT 6-24 HRS) Nasopharyngeal Nasopharyngeal Swab     Status: None   Collection Time: 06/29/19  6:45 PM   Specimen: Nasopharyngeal Swab  Result Value Ref Range Status   SARS Coronavirus 2 NEGATIVE NEGATIVE Final    Comment: (NOTE) SARS-CoV-2 target nucleic acids are NOT DETECTED. The SARS-CoV-2 RNA is generally detectable in upper and lower respiratory specimens during the acute phase of infection. Negative results do not preclude SARS-CoV-2 infection, do not rule out co-infections with other  pathogens, and should not be used as the sole basis for treatment or other patient management decisions. Negative results must be combined with clinical observations, patient history, and epidemiological information. The expected result is Negative. Fact Sheet for Patients: SugarRoll.be Fact Sheet for Healthcare Providers: https://www.woods-.com/ This test is not yet approved or cleared by the [place] FDA and  has been authorized for detection and/or diagnosis of SARS-CoV-2 by FDA under an Emergency Use Authorization (EUA). This EUA will remain  in effect (meaning this test can be used) for the duration of the COVID-19 declaration under Section 56 4(b)(1) of the Act, 21 U.S.C. section 360bbb-3(b)(1), unless the authorization is terminated or revoked sooner. Performed at [facility], [place] [address] [place], [place] [address]   Blood culture (routine x 2)     Status: None   Collection Time: 06/29/19  6:45 PM   Specimen: Right Antecubital; Blood  Result Value Ref Range Status   Specimen Description   Final    RIGHT ANTECUBITAL Performed at [place] [address] [place] [place], [place] [address]    Special Requests   Final    BOTTLES DRAWN AEROBIC AND ANAEROBIC Blood Culture adequate volume Performed at [facility] [address] [place], [place] [address]    Culture   Final    NO GROWTH 5 DAYS Performed at [facility], [place] [address], [place], [place] [address]    Report Status 07/04/2019 FINAL  Final  Blood culture (routine x 2)     Status: None   Collection Time: 06/29/19  6:45 PM   Specimen: BLOOD LEFT FOREARM  Result Value Ref Range Status   Specimen Description   Final  BLOOD LEFT FOREARM Performed at [facility] [address] [place], [place] [address]    Special Requests   Final    BOTTLES DRAWN AEROBIC AND ANAEROBIC Blood Culture  adequate volume Performed at [facility] [address] [place], [place] [address]    Culture   Final    NO GROWTH 5 DAYS Performed at [facility], [place] [address] [place], [place] [address]    Report Status 07/04/2019 FINAL  Final  SARS CORONAVIRUS 2 (TAT 6-24 HRS) Nasopharyngeal Nasopharyngeal Swab     Status: None   Collection Time: 07/04/19  4:40 PM   Specimen: Nasopharyngeal Swab  Result Value Ref Range Status   SARS Coronavirus 2 NEGATIVE NEGATIVE Final    Comment: (NOTE) SARS-CoV-2 target nucleic acids are NOT DETECTED. The SARS-CoV-2 RNA is generally detectable in upper and lower respiratory specimens during the acute phase of infection. Negative results do not preclude SARS-CoV-2 infection, do not rule out co-infections with other pathogens, and should not be used as the sole basis for treatment or other patient management decisions. Negative results must be combined with clinical observations, patient history, and epidemiological information. The expected result is Negative. Fact Sheet for Patients: SugarRoll.be Fact Sheet for Healthcare Providers: https://www.woods-Jamaine Quintin.com/ This test is not yet approved or cleared by the [place] FDA and  has been authorized for detection and/or diagnosis of SARS-CoV-2 by FDA under an Emergency Use Authorization (EUA). This EUA will remain  in effect (meaning this test can be used) for the duration of the COVID-19 declaration under Section 56 4(b)(1) of the Act, 21 U.S.C. section 360bbb-3(b)(1), unless the authorization is terminated or revoked sooner. Performed at [facility], [place] [address], [place], [place] [address]      Time coordinating discharge:  39 minutes  SIGNED:   Georgette Shell, MD  [facility] 07/05/2019, 12:16 PM Pager   If 7PM-7AM, please contact night-coverage [email or web address] Password [email or web address]

## 2019-07-05 NOTE — Progress Notes (Signed)
OT Cancellation Note  Patient Details Name: Henry Carter MRN: [id number] DOB: 02-04-1941   Cancelled Treatment:    Reason Eval/Treat Not Completed: Fatigue/lethargy limiting ability to participate. Will attempt OT tx session on different date and time.   Dianelly Ferran 07/05/2019, 2:46 PM

## 2019-07-05 NOTE — Progress Notes (Signed)
Pts foley noted to be leaking profusely at site, more leakage at site than into drainage bag. Measured amount of NS in balloon and reinflated balloon with 10ccs. Urologist paged.

## 2019-07-05 NOTE — Progress Notes (Signed)
Physical Therapy Treatment Patient Details Name: Henry Carter MRN: [id number] DOB: 1940/04/16 Today's Date: 07/05/2019    History of Present Illness Pt is a 79 y.o. male admitted 06/09/19 with AMS and decreased p.o. intake, wife reports pt also fell out of bed hitting head, no LOC. Head CT negative for acute intracranial abnormality. Worked up for [employer], acute renal failure, anemia, severe metabolic acidosis. s/p IVC filter placement via R groin on 3/1 for DVT. Found to have bladder distention. PMH includes COPD, HTN, PVD, DVT on anticoagulation, BPH with chronic indwelling catheter. acute dx of Acute Renal failure    PT Comments    Pt found sitting naked at foot of bed upon my arrival. He stated he was attempting to get up to the bathroom. Bed alarm was not set. Assisted pt to stand with RW then take several side steps to head of bed, assisted into bed. Pt reported 7/10 bladder spasm pain and therefore didn't feel he could tolerate further activity. Pain medication requested. SNF recommended.     Follow Up Recommendations  Supervision/Assistance - 24 hour;SNF;Supervision for mobility/OOB     Equipment Recommendations  Rolling walker with 5" wheels    Recommendations for Other Services       Precautions / Restrictions Precautions Precautions: Fall Precaution Comments: Chronic indwelling folley catheter Restrictions Weight Bearing Restrictions: No    Mobility  Bed Mobility Overal bed mobility: Needs Assistance Bed Mobility: Sit to Supine       Sit to supine: Min assist   General bed mobility comments: min A for LEs into bed  Transfers Overall transfer level: Needs assistance Equipment used: Rolling walker (2 wheeled) Transfers: Sit to/from Stand Sit to Stand: Min assist;+2 safety/equipment;From elevated surface         General transfer comment: assist to rise, cues for hand placement  Ambulation/Gait Ambulation/Gait assistance: Min guard;+2  safety/equipment Gait Distance (Feet): 4 Feet Assistive device: Rolling walker (2 wheeled) Gait Pattern/deviations: Step-to pattern     General Gait Details: side steps from foot of bed to head of bed with RW, pt declined further ambulation 2* 7/10 bladder spasm pain, pain meds requested   Stairs             Wheelchair Mobility    Modified Rankin (Stroke Patients Only)       Balance Overall balance assessment: Needs assistance Sitting-balance support: Feet supported;Single extremity supported Sitting balance-Leahy Scale: Fair Sitting balance - Comments: posterior lean while sitting unsupported EOB   Standing balance support: Bilateral upper extremity supported Standing balance-Leahy Scale: Poor Standing balance comment: reliant on RW                            Cognition Arousal/Alertness: Awake/alert Behavior During Therapy: WFL for tasks assessed/performed Overall Cognitive Status: Impaired/Different from baseline Area of Impairment: Memory;Following commands;Safety/judgement;Problem solving                               General Comments: pt found naked sitting at foot of bed upon my arrival, his bed was saturated in urine from leaking Foley, pt stated he was trying to get up to the bathroom but hadn't called for help, bed alarm was not set      Exercises      General Comments        Pertinent Vitals/Pain Pain Assessment: 0-10 Pain Score: 7  Pain Location: bladder Pain Descriptors /  Indicators: Spasm Pain Intervention(s): Limited activity within patient's tolerance;Monitored during session;Patient requesting pain meds-RN notified;Repositioned    Home Living                      Prior Function            PT Goals (current goals can now be found in the care plan section) Acute Rehab PT Goals Patient Stated Goal: decrease pain PT Goal Formulation: With patient Time For Goal Achievement: 07/17/19 Potential to Achieve  Goals: Fair Progress towards PT goals: Not progressing toward goals - comment(pain limiting activity progression)    Frequency    Min 3X/week      PT Plan Current plan remains appropriate    Co-evaluation              AM-PAC PT "6 Clicks" Mobility   Outcome Measure  Help needed turning from your back to your side while in a flat bed without using bedrails?: A Little Help needed moving from lying on your back to sitting on the side of a flat bed without using bedrails?: A Little Help needed moving to and from a bed to a chair (including a wheelchair)?: A Lot Help needed standing up from a chair using your arms (e.g., wheelchair or bedside chair)?: A Lot Help needed to walk in hospital room?: A Lot Help needed climbing 3-5 steps with a railing? : Total 6 Click Score: 13    End of Session Equipment Utilized During Treatment: Gait belt Activity Tolerance: Patient limited by fatigue;Patient limited by pain Patient left: in chair;with call bell/phone within reach;in bed;with bed alarm set;with nursing/sitter in room Nurse Communication: Mobility status PT Visit Diagnosis: Other abnormalities of gait and mobility (R26.89);Muscle weakness (generalized) (M62.81)     Time: [id number] PT Time Calculation (min) (ACUTE ONLY): 15 min  Charges:  $Therapeutic Activity: 8-22 mins                     Blondell Reveal Kistler PT 07/05/2019  Acute Rehabilitation Services Pager [phone] Office [phone]

## 2019-07-06 DIAGNOSIS — Z7401 Bed confinement status: Secondary | ICD-10-CM | POA: Diagnosis not present

## 2019-07-06 DIAGNOSIS — N400 Enlarged prostate without lower urinary tract symptoms: Secondary | ICD-10-CM | POA: Diagnosis not present

## 2019-07-06 DIAGNOSIS — Z79899 Other long term (current) drug therapy: Secondary | ICD-10-CM | POA: Diagnosis not present

## 2019-07-06 DIAGNOSIS — I714 Abdominal aortic aneurysm, without rupture: Secondary | ICD-10-CM | POA: Diagnosis not present

## 2019-07-06 DIAGNOSIS — D62 Acute posthemorrhagic anemia: Secondary | ICD-10-CM | POA: Diagnosis not present

## 2019-07-06 DIAGNOSIS — E785 Hyperlipidemia, unspecified: Secondary | ICD-10-CM | POA: Diagnosis not present

## 2019-07-06 DIAGNOSIS — R319 Hematuria, unspecified: Secondary | ICD-10-CM | POA: Diagnosis not present

## 2019-07-06 DIAGNOSIS — N401 Enlarged prostate with lower urinary tract symptoms: Secondary | ICD-10-CM | POA: Diagnosis not present

## 2019-07-06 DIAGNOSIS — R5381 Other malaise: Secondary | ICD-10-CM | POA: Diagnosis not present

## 2019-07-06 DIAGNOSIS — M2550 Pain in unspecified joint: Secondary | ICD-10-CM | POA: Diagnosis not present

## 2019-07-06 DIAGNOSIS — I739 Peripheral vascular disease, unspecified: Secondary | ICD-10-CM | POA: Diagnosis not present

## 2019-07-06 DIAGNOSIS — Z86718 Personal history of other venous thrombosis and embolism: Secondary | ICD-10-CM | POA: Diagnosis not present

## 2019-07-06 DIAGNOSIS — I82409 Acute embolism and thrombosis of unspecified deep veins of unspecified lower extremity: Secondary | ICD-10-CM | POA: Diagnosis not present

## 2019-07-06 DIAGNOSIS — D500 Iron deficiency anemia secondary to blood loss (chronic): Secondary | ICD-10-CM | POA: Diagnosis not present

## 2019-07-06 DIAGNOSIS — I10 Essential (primary) hypertension: Secondary | ICD-10-CM | POA: Diagnosis not present

## 2019-07-06 DIAGNOSIS — N179 Acute kidney failure, unspecified: Secondary | ICD-10-CM | POA: Diagnosis not present

## 2019-07-06 LAB — CBC WITH DIFFERENTIAL/PLATELET
Abs Immature Granulocytes: 0.11 10*3/uL — ABNORMAL HIGH (ref 0.00–0.07)
Basophils Absolute: 0 10*3/uL (ref 0.0–0.1)
Basophils Relative: 0 %
Eosinophils Absolute: 0.2 10*3/uL (ref 0.0–0.5)
Eosinophils Relative: 2 %
HCT: 26.8 % — ABNORMAL LOW (ref 39.0–52.0)
Hemoglobin: 8.3 g/dL — ABNORMAL LOW (ref 13.0–17.0)
Immature Granulocytes: 1 %
Lymphocytes Relative: 8 %
Lymphs Abs: 0.7 10*3/uL (ref 0.7–4.0)
MCH: 26.9 pg (ref 26.0–34.0)
MCHC: 31 g/dL (ref 30.0–36.0)
MCV: 87 fL (ref 80.0–100.0)
Monocytes Absolute: 0.5 10*3/uL (ref 0.1–1.0)
Monocytes Relative: 5 %
Neutro Abs: 7.8 10*3/uL — ABNORMAL HIGH (ref 1.7–7.7)
Neutrophils Relative %: 84 %
Platelets: 410 10*3/uL — ABNORMAL HIGH (ref 150–400)
RBC: 3.08 MIL/uL — ABNORMAL LOW (ref 4.22–5.81)
RDW: 17.2 % — ABNORMAL HIGH (ref 11.5–15.5)
WBC: 9.2 10*3/uL (ref 4.0–10.5)
nRBC: 0 % (ref 0.0–0.2)

## 2019-07-06 NOTE — Progress Notes (Signed)
Physical Therapy Treatment Patient Details Name: Henry Carter MRN: [id number] DOB: 11-13-1940 Today's Date: 07/06/2019    History of Present Illness Pt is a 79 y.o. male admitted 06/09/19 with AMS and decreased p.o. intake, wife reports pt also fell out of bed hitting head, no LOC. Head CT negative for acute intracranial abnormality. Worked up for [employer], acute renal failure, anemia, severe metabolic acidosis. s/p IVC filter placement via R groin on 3/1 for DVT. Found to have bladder distention. PMH includes COPD, HTN, PVD, DVT on anticoagulation, BPH with chronic indwelling catheter. acute dx of Acute Renal failure    PT Comments    Pt refuses therapy initially due to pain. Pt agreeable after pain medication administered, requiring significant encouragement from spouse and therapists. Pt with improving tolerance to activity, but continues to be limited with ambulation secondary to pain. Pt refuses stair training despite encouragement to return home. Pt requires min guard with mobility and refuses additional strengthening exercises. Pt's spouse in room for entire treatment session encouraging pt to participate. Patient will benefit from continued physical therapy in hospital and recommended venue below to increase strength, balance, endurance for safe ADLs and gait.   Follow Up Recommendations  Supervision/Assistance - 24 hour;SNF;Supervision for mobility/OOB     Equipment Recommendations  Rolling walker with 5" wheels    Recommendations for Other Services       Precautions / Restrictions Precautions Precautions: Fall Precaution Comments: Chronic indwelling folley catheter Restrictions Weight Bearing Restrictions: No    Mobility  Bed Mobility Overal bed mobility: Needs Assistance Bed Mobility: Supine to Sit     Supine to sit: Min guard     General bed mobility comments: increased time due to pain, slow movement  Transfers Overall transfer level: Needs  assistance Equipment used: Rolling walker (2 wheeled) Transfers: Sit to/from Stand Sit to Stand: Min guard         General transfer comment: impulsive, cues for hand placement and encouragement to participate  Ambulation/Gait Ambulation/Gait assistance: Min guard;+2 safety/equipment Gait Distance (Feet): 20 Feet Assistive device: Rolling walker (2 wheeled) Gait Pattern/deviations: Step-to pattern;Decreased stride length Gait velocity: decreased   General Gait Details: pt declined further ambulation 2* groin pain, short steps with RW for steadying assistance, spouse in room encouraging participation   Stairs             Wheelchair Mobility    Modified Rankin (Stroke Patients Only)       Balance Overall balance assessment: Needs assistance Sitting-balance support: Feet supported;Single extremity supported Sitting balance-Leahy Scale: Fair Sitting balance - Comments: seated EOB   Standing balance support: During functional activity;Bilateral upper extremity supported Standing balance-Leahy Scale: Fair Standing balance comment: with RW               Cognition Arousal/Alertness: Awake/alert Behavior During Therapy: WFL for tasks assessed/performed Overall Cognitive Status: Within Functional Limits for tasks assessed        General Comments: spouse in room during treatment encouraging pt participation      Exercises General Exercises - Lower Extremity Long Arc Quad: Seated;Both;5 reps    General Comments        Pertinent Vitals/Pain Pain Assessment: 0-10 Pain Score: 7  Pain Location: groin Pain Descriptors / Indicators: Aching;Sore Pain Intervention(s): Limited activity within patient's tolerance;Monitored during session;Premedicated before session;Repositioned    Home Living                      Prior Function  PT Goals (current goals can now be found in the care plan section) Acute Rehab PT Goals Patient Stated Goal:  decrease pain PT Goal Formulation: With patient Time For Goal Achievement: 07/17/19 Potential to Achieve Goals: Fair Progress towards PT goals: Progressing toward goals    Frequency    Min 3X/week      PT Plan Current plan remains appropriate    Co-evaluation   Reason for Co-Treatment: For patient/therapist safety;To address functional/ADL transfers PT goals addressed during session: Mobility/safety with mobility;Balance;Proper use of DME;Strengthening/ROM        AM-PAC PT "6 Clicks" Mobility   Outcome Measure  Help needed turning from your back to your side while in a flat bed without using bedrails?: A Little Help needed moving from lying on your back to sitting on the side of a flat bed without using bedrails?: A Little Help needed moving to and from a bed to a chair (including a wheelchair)?: A Little Help needed standing up from a chair using your arms (e.g., wheelchair or bedside chair)?: A Little Help needed to walk in hospital room?: A Lot Help needed climbing 3-5 steps with a railing? : Total 6 Click Score: 15    End of Session Equipment Utilized During Treatment: Gait belt Activity Tolerance: Patient limited by fatigue;Patient limited by pain Patient left: in chair;with call bell/phone within reach;with chair alarm set;with family/visitor present Nurse Communication: Mobility status PT Visit Diagnosis: Other abnormalities of gait and mobility (R26.89);Muscle weakness (generalized) (M62.81)     Time: [id number] PT Time Calculation (min) (ACUTE ONLY): 19 min  Charges:  $Gait Training: 8-22 mins                      Tori Voshon Petro PT, DPT 07/06/19, 1:07 PM [phone]

## 2019-07-06 NOTE — Progress Notes (Signed)
   Vital Signs MEWS/VS Documentation      07/05/2019 1925 07/05/2019 2127 07/06/2019 0545 07/06/2019 0850   MEWS Score:  0  0  0  2   MEWS Score Color:  Green  Green  Green  Yellow   Resp:  --  19  18  20    Pulse:  --  98  99  (!) 116   BP:  --  (!) 164/91  (!) 149/93  (!) 139/100   Temp:  --  98.7 F (37.1 C)  98.9 F (37.2 C)  98.1 F (36.7 C)   O2 Device:  --  Room Air  --  --   Level of Consciousness:  Alert  --  --  --     Pt is in the Yellow MEWS due to elevated BP and HR. Pt c/o of dizziness and feeling like he is going to pass out. Paged MD. Awaiting new orders. Will continue to monitor closely.     Henry Carter 07/06/2019,8:53 AM

## 2019-07-06 NOTE — Progress Notes (Signed)
Pt being discharged to [facility] via [facility]. Discharge packet was placed up front and attempted to return a call to [facility].

## 2019-07-06 NOTE — TOC Transition Note (Signed)
Transition of Care [facility]) - CM/SW Discharge Note   Patient Details  Name: Henry Carter MRN: [id number] Date of Birth: [birth year]  [facility]) CM/SW Contact:  Lennart Pall, LCSW Phone Number: 07/06/2019, 5:26 PM   Clinical Narrative:   Finally received insurance auth for SNF.  Pt and wife aware transfer today via [facility].  Wife has already spoken with admissions.  No further needs.    Final next level of care: Skilled Nursing Facility Barriers to Discharge: Barriers Resolved   Patient Goals and CMS Choice Patient states their goals for this hospitalization and ongoing recovery are:: Return Home   Choice offered to / list presented to : Patient, Spouse  Discharge Placement              Patient chooses bed at: [facility] Patient to be transferred to facility by: [facility] Name of family member notified: wife Patient and family notified of of transfer: 07/06/19  Discharge Plan and Services                                     Social Determinants of Health (SDOH) Interventions     Readmission Risk Interventions No flowsheet data found.

## 2019-07-06 NOTE — Progress Notes (Signed)
Occupational Therapy Treatment Patient Details Name: Henry Carter MRN: [id number] DOB: Feb 21, 1941 Today's Date: 07/06/2019    History of present illness Pt is a 79 y.o. male admitted 06/09/19 with AMS and decreased p.o. intake, wife reports pt also fell out of bed hitting head, no LOC. Head CT negative for acute intracranial abnormality. Worked up for [employer], acute renal failure, anemia, severe metabolic acidosis. s/p IVC filter placement via R groin on 3/1 for DVT. Found to have bladder distention. PMH includes COPD, HTN, PVD, DVT on anticoagulation, BPH with chronic indwelling catheter. acute dx of Acute Renal failure   OT comments  Patient spouse present during session, helpful with encouraging patient to participate in OOB mobility. Patient min guard to min A with functional transfers and mobility with walker due to safety/mild impulsivity. Limited activity tolerance, repeatedly ask patient what his main barriers are for participating. Patient does mention pain in groin with activity, however was premedicated before therapy session. Will continue to follow   Follow Up Recommendations  SNF    Equipment Recommendations  3 in 1 bedside commode       Precautions / Restrictions Precautions Precautions: Fall Precaution Comments: Chronic indwelling folley catheter Restrictions Weight Bearing Restrictions: No       Mobility Bed Mobility Overal bed mobility: Needs Assistance Bed Mobility: Supine to Sit     Supine to sit: Min guard     General bed mobility comments: increased time due to pain, slow movement  Transfers Overall transfer level: Needs assistance Equipment used: Rolling walker (2 wheeled) Transfers: Sit to/from Stand Sit to Stand: Min guard;Min assist         General transfer comment: impulsive, cues for hand placement and encouragement to participate    Balance Overall balance assessment: Needs assistance Sitting-balance support: Feet supported;Single  extremity supported Sitting balance-Leahy Scale: Fair Sitting balance - Comments: seated EOB   Standing balance support: During functional activity;Bilateral upper extremity supported Standing balance-Leahy Scale: Fair Standing balance comment: can static stand without external support, use RW with dynamic standing                           ADL either performed or assessed with clinical judgement   ADL Overall ADL's : Needs assistance/impaired     Grooming: Wash/dry face;Set up;Sitting;Brushing hair Grooming Details (indicate cue type and reason): declined further g/h              Lower Body Dressing: Total assistance;Bed level Lower Body Dressing Details (indicate cue type and reason): for socks, spouse reports she has been helping with this since he became sick in August Toilet Transfer: Minimal assistance;Ambulation;RW Toilet Transfer Details (indicate cue type and reason): simulated with functional mobility, cues for safety with body mechanics         Functional mobility during ADLs: Minimal assistance;Rolling walker General ADL Comments: patient limited by decreased safety awareness and decreased activity tolerance                Cognition Arousal/Alertness: Awake/alert Behavior During Therapy: WFL for tasks assessed/performed Overall Cognitive Status: Within Functional Limits for tasks assessed                                 General Comments: spouse in room during treatment encouraging pt participation                   Pertinent  Vitals/ Pain       Pain Assessment: Faces  Faces Pain Scale: Hurts a little bit Pain Location: groin Pain Descriptors / Indicators: Aching;Sore Pain Intervention(s): Premedicated before session         Frequency  Min 2X/week        Progress Toward Goals  OT Goals(current goals can now be found in the care plan section)   Progressing towards goals  Acute Rehab OT Goals Patient Stated  Goal: decrease pain OT Goal Formulation: With patient Time For Goal Achievement: 07/17/19 Potential to Achieve Goals: Good ADL Goals Pt Will Perform Grooming: with set-up Pt Will Perform Lower Body Dressing: with min assist;with adaptive equipment Pt Will Transfer to Toilet: with supervision;ambulating Pt Will Perform Toileting - Clothing Manipulation and hygiene: with min guard assist Pt/caregiver will Perform Home Exercise Program: Increased strength;Both right and left upper extremity;With Supervision Additional ADL Goal #1: Pt will increase standing tolerance x5 mins in fair balance in prep for ADL  Plan Discharge plan remains appropriate    Co-evaluation    PT/OT/SLP Co-Evaluation/Treatment: Yes Reason for Co-Treatment: To address functional/ADL transfers;Necessary to address cognition/behavior during functional activity PT goals addressed during session: Mobility/safety with mobility;Balance;Proper use of DME;Strengthening/ROM OT goals addressed during session: ADL's and self-care      AM-PAC OT "6 Clicks" Daily Activity     Outcome Measure   Help from another person eating meals?: A Little Help from another person taking care of personal grooming?: A Little Help from another person toileting, which includes using toliet, bedpan, or urinal?: A Lot Help from another person bathing (including washing, rinsing, drying)?: A Lot Help from another person to put on and taking off regular upper body clothing?: A Little Help from another person to put on and taking off regular lower body clothing?: A Lot 6 Click Score: 15    End of Session Equipment Utilized During Treatment: Rolling walker  OT Visit Diagnosis: Unsteadiness on feet (R26.81);Muscle weakness (generalized) (M62.81)   Activity Tolerance Patient limited by pain;Patient limited by fatigue   Patient Left in chair;with call bell/phone within reach;with family/visitor present;with chair alarm set   Nurse Communication  Mobility status        Time: [id number] OT Time Calculation (min): 23 min  Charges: OT General Charges $OT Visit: 1 Visit OT Treatments $Self Care/Home Management : 8-22 mins  [place] OT office: [place] 07/06/2019, 1:26 PM

## 2019-07-11 DIAGNOSIS — D62 Acute posthemorrhagic anemia: Secondary | ICD-10-CM | POA: Diagnosis not present

## 2019-07-11 DIAGNOSIS — I82409 Acute embolism and thrombosis of unspecified deep veins of unspecified lower extremity: Secondary | ICD-10-CM | POA: Diagnosis not present

## 2019-07-11 DIAGNOSIS — N400 Enlarged prostate without lower urinary tract symptoms: Secondary | ICD-10-CM | POA: Diagnosis not present

## 2019-07-11 DIAGNOSIS — R319 Hematuria, unspecified: Secondary | ICD-10-CM | POA: Diagnosis not present

## 2019-07-18 DIAGNOSIS — G319 Degenerative disease of nervous system, unspecified: Secondary | ICD-10-CM

## 2019-07-18 DIAGNOSIS — N138 Other obstructive and reflux uropathy: Secondary | ICD-10-CM | POA: Diagnosis not present

## 2019-07-18 DIAGNOSIS — N3289 Other specified disorders of bladder: Secondary | ICD-10-CM

## 2019-07-18 DIAGNOSIS — N179 Acute kidney failure, unspecified: Secondary | ICD-10-CM | POA: Diagnosis not present

## 2019-07-18 DIAGNOSIS — I70219 Atherosclerosis of native arteries of extremities with intermittent claudication, unspecified extremity: Secondary | ICD-10-CM

## 2019-07-18 DIAGNOSIS — N2882 Megaloureter: Secondary | ICD-10-CM

## 2019-07-18 DIAGNOSIS — I6782 Cerebral ischemia: Secondary | ICD-10-CM

## 2019-07-18 DIAGNOSIS — Z9181 History of falling: Secondary | ICD-10-CM

## 2019-07-18 DIAGNOSIS — G3184 Mild cognitive impairment, so stated: Secondary | ICD-10-CM

## 2019-07-18 DIAGNOSIS — N401 Enlarged prostate with lower urinary tract symptoms: Secondary | ICD-10-CM | POA: Diagnosis not present

## 2019-07-18 DIAGNOSIS — Q2733 Arteriovenous malformation of digestive system vessel: Secondary | ICD-10-CM

## 2019-07-18 DIAGNOSIS — Z87891 Personal history of nicotine dependence: Secondary | ICD-10-CM

## 2019-07-18 DIAGNOSIS — E785 Hyperlipidemia, unspecified: Secondary | ICD-10-CM

## 2019-07-18 DIAGNOSIS — I714 Abdominal aortic aneurysm, without rupture: Secondary | ICD-10-CM

## 2019-07-18 DIAGNOSIS — I10 Essential (primary) hypertension: Secondary | ICD-10-CM

## 2019-07-18 DIAGNOSIS — J439 Emphysema, unspecified: Secondary | ICD-10-CM

## 2019-07-18 DIAGNOSIS — G910 Communicating hydrocephalus: Secondary | ICD-10-CM

## 2019-07-18 DIAGNOSIS — M1711 Unilateral primary osteoarthritis, right knee: Secondary | ICD-10-CM

## 2019-07-18 DIAGNOSIS — G9341 Metabolic encephalopathy: Secondary | ICD-10-CM

## 2019-07-18 DIAGNOSIS — Z86018 Personal history of other benign neoplasm: Secondary | ICD-10-CM

## 2019-07-18 DIAGNOSIS — Z95828 Presence of other vascular implants and grafts: Secondary | ICD-10-CM

## 2019-07-18 DIAGNOSIS — E860 Dehydration: Secondary | ICD-10-CM

## 2019-07-18 DIAGNOSIS — K219 Gastro-esophageal reflux disease without esophagitis: Secondary | ICD-10-CM

## 2019-07-18 DIAGNOSIS — Z466 Encounter for fitting and adjustment of urinary device: Secondary | ICD-10-CM | POA: Diagnosis not present

## 2019-07-18 DIAGNOSIS — I700 Atherosclerosis of aorta: Secondary | ICD-10-CM

## 2019-07-18 DIAGNOSIS — I82402 Acute embolism and thrombosis of unspecified deep veins of left lower extremity: Secondary | ICD-10-CM

## 2019-07-18 DIAGNOSIS — D62 Acute posthemorrhagic anemia: Secondary | ICD-10-CM

## 2019-07-18 DIAGNOSIS — R000 Tachycardia, unspecified: Secondary | ICD-10-CM

## 2019-07-19 ENCOUNTER — Telehealth: Payer: Self-pay

## 2019-07-19 ENCOUNTER — Encounter: Payer: Self-pay | Admitting: Internal Medicine

## 2019-07-19 ENCOUNTER — Ambulatory Visit (INDEPENDENT_AMBULATORY_CARE_PROVIDER_SITE_OTHER): Payer: Medicare HMO | Admitting: Internal Medicine

## 2019-07-19 ENCOUNTER — Other Ambulatory Visit: Payer: Self-pay

## 2019-07-19 VITALS — BP 138/64 | HR 101 | Temp 98.0°F | Resp 14 | Ht 70.0 in

## 2019-07-19 DIAGNOSIS — G912 (Idiopathic) normal pressure hydrocephalus: Secondary | ICD-10-CM | POA: Diagnosis not present

## 2019-07-19 DIAGNOSIS — Z978 Presence of other specified devices: Secondary | ICD-10-CM

## 2019-07-19 DIAGNOSIS — I639 Cerebral infarction, unspecified: Secondary | ICD-10-CM | POA: Diagnosis not present

## 2019-07-19 DIAGNOSIS — N179 Acute kidney failure, unspecified: Secondary | ICD-10-CM | POA: Diagnosis not present

## 2019-07-19 DIAGNOSIS — R338 Other retention of urine: Secondary | ICD-10-CM | POA: Diagnosis not present

## 2019-07-19 DIAGNOSIS — D62 Acute posthemorrhagic anemia: Secondary | ICD-10-CM | POA: Diagnosis not present

## 2019-07-19 DIAGNOSIS — R310 Gross hematuria: Secondary | ICD-10-CM

## 2019-07-19 DIAGNOSIS — D500 Iron deficiency anemia secondary to blood loss (chronic): Secondary | ICD-10-CM | POA: Diagnosis not present

## 2019-07-19 DIAGNOSIS — Z86718 Personal history of other venous thrombosis and embolism: Secondary | ICD-10-CM | POA: Diagnosis not present

## 2019-07-19 LAB — COMPREHENSIVE METABOLIC PANEL
ALT: 27 U/L (ref 0–53)
AST: 23 U/L (ref 0–37)
Albumin: 3.1 g/dL — ABNORMAL LOW (ref 3.5–5.2)
Alkaline Phosphatase: 93 U/L (ref 39–117)
BUN: 21 mg/dL (ref 6–23)
CO2: 24 mEq/L (ref 19–32)
Calcium: 8.6 mg/dL (ref 8.4–10.5)
Chloride: 102 mEq/L (ref 96–112)
Creatinine, Ser: 1.29 mg/dL (ref 0.40–1.50)
GFR: 65.17 mL/min (ref >60.00)
Glucose, Bld: 146 mg/dL — ABNORMAL HIGH (ref 70–99)
Potassium: 3.8 mEq/L (ref 3.5–5.1)
Sodium: 133 mEq/L — ABNORMAL LOW (ref 135–145)
Total Bilirubin: 0.3 mg/dL (ref 0.2–1.2)
Total Protein: 6.3 g/dL (ref 6.0–8.3)

## 2019-07-19 LAB — CBC
HCT: 28.3 % — ABNORMAL LOW (ref 39.0–52.0)
Hemoglobin: 9.5 g/dL — ABNORMAL LOW (ref 13.0–17.0)
MCHC: 33.4 g/dL (ref 30.0–36.0)
MCV: 85.5 fl (ref 78.0–100.0)
Platelets: 385 10*3/uL (ref 150.0–400.0)
RBC: 3.31 Mil/uL — ABNORMAL LOW (ref 4.22–5.81)
RDW: 21.1 % — ABNORMAL HIGH (ref 11.5–15.5)
WBC: 6.3 10*3/uL (ref 4.0–10.5)

## 2019-07-19 NOTE — Patient Instructions (Signed)
We are checking the labs today and will call you about the results.   He should be taking doxycycline 1 pill twice a day until gone for urine infection.  He should be taking finasteride 1 pill daily to help the catheter not get clogged.   He should be taking iron pill every other day to help the blood counts go back to normal.

## 2019-07-19 NOTE — Telephone Encounter (Signed)
Community message sent today to [employer] and Jonn Shingles for wheelchair order.

## 2019-07-19 NOTE — Progress Notes (Signed)
   Subjective:   Patient ID: Henry Carter, male    DOB: [birth year], 79 y.o.   MRN: [id number]  HPI The patient is a 79 YO man coming in for hospital follow up (in for renal failure with obstruction of his catheter due to blood clot, irrigated and given fluids with partial resolution of AKI). His wife is present and helps to provide history but they are not sure of his history. It sounds like he went to a rehab stay for 1 week but then wife states she pulled him out of there. It is unclear how long he stayed. They state he got no meds there. They did send him home with 1 month supply of medications but he is currently taking no medications whatsoever. They saw his urologist today and they checked the urine. He had catheter to pull out at home and so it was replaced. It appears to be draining at this time. Does still have blood and clots in the urine consistently. Is very weak and needs wheelchair to get around. Denies new leg swelling or pain. Denies abdominal problems such as diarrhea or constipation. Denies SOB or chest pains. Denies fevers or chills. Urology advised him to take 2 medications doxycycline and finasteride but they have not gotten or started yet.   PMH, [facility], social history reviewed and updated  Review of Systems  Constitutional: Positive for activity change.  HENT: Negative.   Eyes: Negative.   Respiratory: Negative for cough, chest tightness and shortness of breath.   Cardiovascular: Negative for chest pain, palpitations and leg swelling.  Gastrointestinal: Negative for abdominal distention, abdominal pain, constipation, diarrhea, nausea and vomiting.  Genitourinary: Positive for hematuria. Negative for dysuria and flank pain.  Musculoskeletal: Negative.   Skin: Negative.   Neurological: Positive for weakness.  Psychiatric/Behavioral: Negative.     Objective:  Physical Exam Constitutional:      Appearance: He is well-developed.     Comments: Thin and frail appearing    HENT:     Head: Normocephalic and atraumatic.  Cardiovascular:     Rate and Rhythm: Normal rate and regular rhythm.  Pulmonary:     Effort: Pulmonary effort is normal. No respiratory distress.     Breath sounds: Normal breath sounds. No wheezing or rales.  Abdominal:     General: Bowel sounds are normal. There is no distension.     Palpations: Abdomen is soft.     Tenderness: There is no abdominal tenderness. There is no rebound.  Musculoskeletal:     Cervical back: Normal range of motion.  Skin:    General: Skin is warm and dry.  Neurological:     Mental Status: He is alert and oriented to person, place, and time.     Coordination: Coordination abnormal.     Comments: Poor historian and wife is also poor historian     Vitals:   07/19/19 1405  BP: 138/64  Pulse: (!) 101  Resp: 14  Temp: 98 F (36.7 C)  TempSrc: Oral  SpO2: 99%  Height: 5\' 10"  (1.778 m)    This visit occurred during the SARS-CoV-2 public health emergency.  Safety protocols were in place, including screening questions prior to the visit, additional usage of staff PPE, and extensive cleaning of exam room while observing appropriate contact time as indicated for disinfecting solutions.   Assessment & Plan:

## 2019-07-20 DIAGNOSIS — J439 Emphysema, unspecified: Secondary | ICD-10-CM | POA: Diagnosis not present

## 2019-07-20 DIAGNOSIS — I70219 Atherosclerosis of native arteries of extremities with intermittent claudication, unspecified extremity: Secondary | ICD-10-CM | POA: Diagnosis not present

## 2019-07-20 DIAGNOSIS — G9341 Metabolic encephalopathy: Secondary | ICD-10-CM | POA: Diagnosis not present

## 2019-07-20 DIAGNOSIS — I82402 Acute embolism and thrombosis of unspecified deep veins of left lower extremity: Secondary | ICD-10-CM | POA: Diagnosis not present

## 2019-07-20 DIAGNOSIS — N138 Other obstructive and reflux uropathy: Secondary | ICD-10-CM | POA: Diagnosis not present

## 2019-07-20 DIAGNOSIS — Z466 Encounter for fitting and adjustment of urinary device: Secondary | ICD-10-CM | POA: Diagnosis not present

## 2019-07-20 DIAGNOSIS — N401 Enlarged prostate with lower urinary tract symptoms: Secondary | ICD-10-CM | POA: Diagnosis not present

## 2019-07-20 DIAGNOSIS — I10 Essential (primary) hypertension: Secondary | ICD-10-CM | POA: Diagnosis not present

## 2019-07-20 DIAGNOSIS — N179 Acute kidney failure, unspecified: Secondary | ICD-10-CM | POA: Diagnosis not present

## 2019-07-20 NOTE — Assessment & Plan Note (Signed)
Not taking iron at this time, asked to start that. Checking CBC as this was down trending upon last day in hospital and not rechecked since that time.

## 2019-07-20 NOTE — Assessment & Plan Note (Signed)
Needs CMP today as patient and wife are unsure if labs were done at urology and we are unable to assess their records.

## 2019-07-20 NOTE — Assessment & Plan Note (Signed)
With recurrent issues and asked him to be sure to follow regularly with urology.

## 2019-07-20 NOTE — Assessment & Plan Note (Signed)
Has IVC filter and given his poor compliance with medications likely will not be able to complete any therapy with xarelto. No evidence for recurrence at this time. 3 months post DVT would be 08/09/19. This was provoked.

## 2019-07-20 NOTE — Assessment & Plan Note (Signed)
He is supposed to be taking plavix but does not take this.

## 2019-07-20 NOTE — Assessment & Plan Note (Signed)
Still seeing urology and given severe blood loss over the last month will still hold xarelto. Checking CBC today given lack of stability and dropping last day in hospital and persistent blood and clots in urine.

## 2019-07-23 ENCOUNTER — Other Ambulatory Visit: Payer: Self-pay

## 2019-07-23 ENCOUNTER — Emergency Department (HOSPITAL_COMMUNITY)
Admission: EM | Admit: 2019-07-23 | Discharge: 2019-07-23 | Disposition: A | Discharge status: Home / Self Care | Payer: Medicare HMO | Attending: Emergency Medicine | Admitting: Emergency Medicine

## 2019-07-23 DIAGNOSIS — Z5321 Procedure and treatment not carried out due to patient leaving prior to being seen by health care provider: Secondary | ICD-10-CM | POA: Diagnosis not present

## 2019-07-23 DIAGNOSIS — R300 Dysuria: Secondary | ICD-10-CM | POA: Insufficient documentation

## 2019-07-23 NOTE — ED Triage Notes (Signed)
Pt reports that was seen here on 3/19 and got foley catheter placed. Reports has pain with urination. hasnt had it changed since it was placed.

## 2019-07-24 DIAGNOSIS — N179 Acute kidney failure, unspecified: Secondary | ICD-10-CM | POA: Diagnosis not present

## 2019-07-24 DIAGNOSIS — N138 Other obstructive and reflux uropathy: Secondary | ICD-10-CM | POA: Diagnosis not present

## 2019-07-25 ENCOUNTER — Telehealth: Payer: Self-pay

## 2019-07-25 DIAGNOSIS — I10 Essential (primary) hypertension: Secondary | ICD-10-CM | POA: Diagnosis not present

## 2019-07-25 DIAGNOSIS — J439 Emphysema, unspecified: Secondary | ICD-10-CM | POA: Diagnosis not present

## 2019-07-25 DIAGNOSIS — I70219 Atherosclerosis of native arteries of extremities with intermittent claudication, unspecified extremity: Secondary | ICD-10-CM | POA: Diagnosis not present

## 2019-07-25 DIAGNOSIS — Z466 Encounter for fitting and adjustment of urinary device: Secondary | ICD-10-CM | POA: Diagnosis not present

## 2019-07-25 DIAGNOSIS — I82402 Acute embolism and thrombosis of unspecified deep veins of left lower extremity: Secondary | ICD-10-CM | POA: Diagnosis not present

## 2019-07-25 DIAGNOSIS — N179 Acute kidney failure, unspecified: Secondary | ICD-10-CM | POA: Diagnosis not present

## 2019-07-25 DIAGNOSIS — N401 Enlarged prostate with lower urinary tract symptoms: Secondary | ICD-10-CM | POA: Diagnosis not present

## 2019-07-25 DIAGNOSIS — G9341 Metabolic encephalopathy: Secondary | ICD-10-CM | POA: Diagnosis not present

## 2019-07-25 DIAGNOSIS — N138 Other obstructive and reflux uropathy: Secondary | ICD-10-CM | POA: Diagnosis not present

## 2019-07-25 NOTE — Telephone Encounter (Signed)
New message   [place] home health calling   The patient was discharged at some point from [facility] and [place] home health was never notified of the patient discharged.   The wife is asking for Nursing to come back out to the home will need a verbal order.

## 2019-07-25 NOTE — Telephone Encounter (Signed)
Fine

## 2019-07-25 NOTE — Telephone Encounter (Signed)
Verbal orders given to [facility] from [facility].

## 2019-07-27 ENCOUNTER — Telehealth: Payer: Self-pay | Admitting: Internal Medicine

## 2019-07-27 DIAGNOSIS — I639 Cerebral infarction, unspecified: Secondary | ICD-10-CM

## 2019-07-27 DIAGNOSIS — Z466 Encounter for fitting and adjustment of urinary device: Secondary | ICD-10-CM | POA: Diagnosis not present

## 2019-07-27 DIAGNOSIS — N138 Other obstructive and reflux uropathy: Secondary | ICD-10-CM | POA: Diagnosis not present

## 2019-07-27 DIAGNOSIS — I70219 Atherosclerosis of native arteries of extremities with intermittent claudication, unspecified extremity: Secondary | ICD-10-CM | POA: Diagnosis not present

## 2019-07-27 DIAGNOSIS — N401 Enlarged prostate with lower urinary tract symptoms: Secondary | ICD-10-CM | POA: Diagnosis not present

## 2019-07-27 DIAGNOSIS — N179 Acute kidney failure, unspecified: Secondary | ICD-10-CM | POA: Diagnosis not present

## 2019-07-27 DIAGNOSIS — J439 Emphysema, unspecified: Secondary | ICD-10-CM | POA: Diagnosis not present

## 2019-07-27 DIAGNOSIS — I82402 Acute embolism and thrombosis of unspecified deep veins of left lower extremity: Secondary | ICD-10-CM | POA: Diagnosis not present

## 2019-07-27 DIAGNOSIS — I10 Essential (primary) hypertension: Secondary | ICD-10-CM | POA: Diagnosis not present

## 2019-07-27 DIAGNOSIS — G9341 Metabolic encephalopathy: Secondary | ICD-10-CM | POA: Diagnosis not present

## 2019-07-27 NOTE — Telephone Encounter (Signed)
Called pt/wife no answer LMOM RTC.../lmb 

## 2019-07-27 NOTE — Telephone Encounter (Signed)
Sree from [facility] called [phone] for the physical therapy for one time a week for four weeks and adding a Education officer, museum. He said he was more appropriate for hospice care

## 2019-07-27 NOTE — Telephone Encounter (Signed)
Notified Sree w/MD response.../lmb 

## 2019-07-27 NOTE — Telephone Encounter (Signed)
Is patient/wife okay with hospice referral?

## 2019-07-27 NOTE — Telephone Encounter (Signed)
    Agapito Games from [place] calling to report: failure to thrive Requesting review/order for hospice care  [place] phone # [phone]

## 2019-07-27 NOTE — Telephone Encounter (Signed)
Ok for PT 

## 2019-07-30 NOTE — Telephone Encounter (Signed)
Referral to hospice placed, typically these have to be faxed to hospice agency directly.

## 2019-07-30 NOTE — Telephone Encounter (Signed)
Spoke with wife and she is okay with Hospice referral

## 2019-08-01 DIAGNOSIS — N401 Enlarged prostate with lower urinary tract symptoms: Secondary | ICD-10-CM | POA: Diagnosis not present

## 2019-08-01 DIAGNOSIS — I82402 Acute embolism and thrombosis of unspecified deep veins of left lower extremity: Secondary | ICD-10-CM | POA: Diagnosis not present

## 2019-08-01 DIAGNOSIS — J439 Emphysema, unspecified: Secondary | ICD-10-CM | POA: Diagnosis not present

## 2019-08-01 DIAGNOSIS — I10 Essential (primary) hypertension: Secondary | ICD-10-CM | POA: Diagnosis not present

## 2019-08-01 DIAGNOSIS — G9341 Metabolic encephalopathy: Secondary | ICD-10-CM | POA: Diagnosis not present

## 2019-08-01 DIAGNOSIS — Z466 Encounter for fitting and adjustment of urinary device: Secondary | ICD-10-CM | POA: Diagnosis not present

## 2019-08-01 DIAGNOSIS — N138 Other obstructive and reflux uropathy: Secondary | ICD-10-CM | POA: Diagnosis not present

## 2019-08-01 DIAGNOSIS — N179 Acute kidney failure, unspecified: Secondary | ICD-10-CM | POA: Diagnosis not present

## 2019-08-01 DIAGNOSIS — I70219 Atherosclerosis of native arteries of extremities with intermittent claudication, unspecified extremity: Secondary | ICD-10-CM | POA: Diagnosis not present

## 2019-08-01 NOTE — Telephone Encounter (Signed)
Referral department will be in contact with patient directly

## 2019-08-02 ENCOUNTER — Telehealth: Payer: Self-pay | Admitting: Internal Medicine

## 2019-08-02 NOTE — Telephone Encounter (Signed)
New message:   Ok to move visit from this week to next week while waiting tub transfer bench that care giver is going to pick up. Please advise.

## 2019-08-07 NOTE — Telephone Encounter (Signed)
Called and left voicemail message with verbal okay

## 2019-08-08 ENCOUNTER — Telehealth: Payer: Self-pay

## 2019-08-08 NOTE — Telephone Encounter (Signed)
Received a call from [place] at [place].  Patient was admitted to the hospital on 08/03/2019 since that day he has pulled out this cathter 3 different times. Today he pulled the cathter out with the bulb still inflated.   Nurse Mendel Ryder didn't put the cathter back in.    Do you want her to place cathter back today or is it okay to leave out?     Please advise

## 2019-08-09 NOTE — Telephone Encounter (Signed)
Urology needs to determine as they manage his catheter.

## 2019-08-10 NOTE — Telephone Encounter (Addendum)
Henry Carter @ [phone] lvm for call back to office

## 2019-08-15 ENCOUNTER — Telehealth: Payer: Self-pay

## 2019-08-15 NOTE — Telephone Encounter (Signed)
New message   Patient C/o lower ABD intermittent pain worse at night Tylenol has not worked.   Asking for tramadol   [facility] #[id number] [address], [place] - [place]

## 2019-08-16 NOTE — Telephone Encounter (Signed)
Would need acute visit with someone to assess

## 2019-08-16 NOTE — Telephone Encounter (Signed)
Patient not able to come in today. Made appointment to see Dr. Jenny Reichmann tomorrow. Dr. [name] was full.

## 2019-08-17 ENCOUNTER — Emergency Department (HOSPITAL_COMMUNITY)
Admission: EM | Admit: 2019-08-17 | Discharge: 2019-08-18 | Disposition: A | Discharge status: Home / Self Care | Attending: Emergency Medicine | Admitting: Emergency Medicine

## 2019-08-17 ENCOUNTER — Encounter: Payer: Self-pay | Admitting: Internal Medicine

## 2019-08-17 ENCOUNTER — Emergency Department (HOSPITAL_COMMUNITY)

## 2019-08-17 ENCOUNTER — Ambulatory Visit (INDEPENDENT_AMBULATORY_CARE_PROVIDER_SITE_OTHER): Payer: Medicare HMO | Admitting: Internal Medicine

## 2019-08-17 ENCOUNTER — Encounter (HOSPITAL_COMMUNITY): Payer: Self-pay | Admitting: Emergency Medicine

## 2019-08-17 ENCOUNTER — Other Ambulatory Visit: Payer: Self-pay

## 2019-08-17 VITALS — BP 160/90 | HR 79 | Temp 97.8°F | Ht 70.0 in

## 2019-08-17 DIAGNOSIS — N320 Bladder-neck obstruction: Secondary | ICD-10-CM | POA: Diagnosis not present

## 2019-08-17 DIAGNOSIS — R1084 Generalized abdominal pain: Secondary | ICD-10-CM | POA: Diagnosis not present

## 2019-08-17 DIAGNOSIS — Z79899 Other long term (current) drug therapy: Secondary | ICD-10-CM | POA: Insufficient documentation

## 2019-08-17 DIAGNOSIS — Z87891 Personal history of nicotine dependence: Secondary | ICD-10-CM | POA: Diagnosis not present

## 2019-08-17 DIAGNOSIS — J449 Chronic obstructive pulmonary disease, unspecified: Secondary | ICD-10-CM | POA: Diagnosis not present

## 2019-08-17 DIAGNOSIS — I714 Abdominal aortic aneurysm, without rupture: Secondary | ICD-10-CM | POA: Diagnosis not present

## 2019-08-17 DIAGNOSIS — F1210 Cannabis abuse, uncomplicated: Secondary | ICD-10-CM | POA: Insufficient documentation

## 2019-08-17 DIAGNOSIS — N1330 Unspecified hydronephrosis: Secondary | ICD-10-CM | POA: Diagnosis not present

## 2019-08-17 DIAGNOSIS — R319 Hematuria, unspecified: Secondary | ICD-10-CM | POA: Diagnosis not present

## 2019-08-17 DIAGNOSIS — R1030 Lower abdominal pain, unspecified: Secondary | ICD-10-CM | POA: Diagnosis not present

## 2019-08-17 DIAGNOSIS — N390 Urinary tract infection, site not specified: Secondary | ICD-10-CM | POA: Insufficient documentation

## 2019-08-17 DIAGNOSIS — I10 Essential (primary) hypertension: Secondary | ICD-10-CM | POA: Diagnosis not present

## 2019-08-17 DIAGNOSIS — R109 Unspecified abdominal pain: Secondary | ICD-10-CM | POA: Insufficient documentation

## 2019-08-17 LAB — URINALYSIS, ROUTINE W REFLEX MICROSCOPIC
Bilirubin Urine: NEGATIVE
Glucose, UA: NEGATIVE mg/dL
Ketones, ur: NEGATIVE mg/dL
Nitrite: NEGATIVE
Protein, ur: NEGATIVE mg/dL
Specific Gravity, Urine: 1.011 (ref 1.005–1.030)
WBC, UA: >50 WBC/hpf — ABNORMAL HIGH (ref 0–5)
pH: 7 (ref 5.0–8.0)

## 2019-08-17 LAB — CBC WITH DIFFERENTIAL/PLATELET
Abs Immature Granulocytes: 0.02 10*3/uL (ref 0.00–0.07)
Basophils Absolute: 0 10*3/uL (ref 0.0–0.1)
Basophils Relative: 1 %
Eosinophils Absolute: 0.3 10*3/uL (ref 0.0–0.5)
Eosinophils Relative: 5 %
HCT: 28.3 % — ABNORMAL LOW (ref 39.0–52.0)
Hemoglobin: 8.8 g/dL — ABNORMAL LOW (ref 13.0–17.0)
Immature Granulocytes: 0 %
Lymphocytes Relative: 15 %
Lymphs Abs: 0.8 10*3/uL (ref 0.7–4.0)
MCH: 27.6 pg (ref 26.0–34.0)
MCHC: 31.1 g/dL (ref 30.0–36.0)
MCV: 88.7 fL (ref 80.0–100.0)
Monocytes Absolute: 0.6 10*3/uL (ref 0.1–1.0)
Monocytes Relative: 12 %
Neutro Abs: 3.5 10*3/uL (ref 1.7–7.7)
Neutrophils Relative %: 67 %
Platelets: 298 10*3/uL (ref 150–400)
RBC: 3.19 MIL/uL — ABNORMAL LOW (ref 4.22–5.81)
RDW: 20.2 % — ABNORMAL HIGH (ref 11.5–15.5)
WBC: 5.1 10*3/uL (ref 4.0–10.5)
nRBC: 0 % (ref 0.0–0.2)

## 2019-08-17 LAB — COMPREHENSIVE METABOLIC PANEL
ALT: 16 U/L (ref 0–44)
AST: 18 U/L (ref 15–41)
Albumin: 2.5 g/dL — ABNORMAL LOW (ref 3.5–5.0)
Alkaline Phosphatase: 92 U/L (ref 38–126)
Anion gap: 7 (ref 5–15)
BUN: 24 mg/dL — ABNORMAL HIGH (ref 8–23)
CO2: 21 mmol/L — ABNORMAL LOW (ref 22–32)
Calcium: 8.6 mg/dL — ABNORMAL LOW (ref 8.9–10.3)
Chloride: 108 mmol/L (ref 98–111)
Creatinine, Ser: 2 mg/dL — ABNORMAL HIGH (ref 0.61–1.24)
GFR calc Af Amer: 36 mL/min — ABNORMAL LOW (ref >60)
GFR calc non Af Amer: 31 mL/min — ABNORMAL LOW (ref >60)
Glucose, Bld: 102 mg/dL — ABNORMAL HIGH (ref 70–99)
Potassium: 4.6 mmol/L (ref 3.5–5.1)
Sodium: 136 mmol/L (ref 135–145)
Total Bilirubin: 0.2 mg/dL — ABNORMAL LOW (ref 0.3–1.2)
Total Protein: 5.6 g/dL — ABNORMAL LOW (ref 6.5–8.1)

## 2019-08-17 LAB — LIPASE, BLOOD: Lipase: 19 U/L (ref 11–51)

## 2019-08-17 MED ORDER — CIPROFLOXACIN HCL 500 MG PO TABS
500.0000 mg | ORAL_TABLET | Freq: Once | ORAL | Status: AC
  Administered 2019-08-17: 500 mg via ORAL
  Filled 2019-08-17: qty 1

## 2019-08-17 MED ORDER — IOHEXOL 300 MG/ML  SOLN
75.0000 mL | Freq: Once | INTRAMUSCULAR | Status: AC | PRN
  Administered 2019-08-17: 75 mL via INTRAVENOUS

## 2019-08-17 MED ORDER — CIPROFLOXACIN HCL 250 MG PO TABS: 250.0000 mg | ORAL_TABLET | Freq: Two times a day (BID) | ORAL | 0 refills | Status: DC

## 2019-08-17 NOTE — ED Provider Notes (Signed)
[facility] DEPT Provider Note   CSN: [id number] Arrival date & time: 08/17/19  1514     History Chief Complaint  Patient presents with  . Abdominal Pain    Henry Carter is a 79 y.o. male.  HPI   Patient presents to the ED for evaluation of abdominal pain.  Patient started having pain in his lower abdomen.  Patient has been having some increasing discomfort over the last 24 hours.  Occasionally has pain when he tries to urinate.  Patient denies any trouble with fevers or chills.  He denies any diarrhea.  He denies any vomiting.  Patient was in the hospital in March for obstruction of Foley catheter and acute kidney injury.  Patient followed up with his primary care doctor today.  In the office he was concerned about the possibility of recurrent urinary retention so he sent to the ED for further evaluation.    Past Medical History:  Diagnosis Date  .  iron deficiency 01/18/2013  . Anemia   . Angioedema   . BPH (benign prostatic hyperplasia)   . COPD (chronic obstructive pulmonary disease) ([place])    STates he has copd  . Essential hypertension   . GERD (gastroesophageal reflux disease)   . Helicobacter pylori gastritis 10/10/2012  . Hyperlipidemia   . IBS (irritable bowel syndrome)   . Impotence   . Peripheral vascular disease, unspecified ([place])   . Personal history of colonic adenoma 09/22/2012  . Personal history of tobacco use, presenting hazards to health     Patient Active Problem List   Diagnosis Date Noted  . 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 Abdominal pain 08/17/2019  . Chronic indwelling Foley catheter 06/29/2019  . History of DVT (deep vein thrombosis) 06/29/2019  . Obstruction of Foley catheter ([place]) 06/29/2019  . Acute bilateral obstructive uropathy   . Hypokalemia   . Acute metabolic encephalopathy   . Hyponatremia   . Hematuria 06/09/2019  . Acute renal failure (ARF) ([place]) 06/09/2019  . Acute blood loss anemia  06/09/2019  . Hyperkalemia 06/09/2019  . Metabolic acidosis 99991111  . Left leg DVT ([place]) 05/18/2019  . Left leg swelling 05/11/2019  . Pre-operative cardiovascular examination 03/22/2019  . 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[place]) 07/11/2018  . Right arm weakness 07/21/2017  . Non compliance w medication regimen 07/30/2016  . 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 Mild cognitive impairment 12/11/2015  . 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 Healthcare maintenance 02/07/2013  . Iron deficiency anemia due to chronic blood loss 01/18/2013  . C O P D 04/08/2010  . Atherosclerosis of native artery of extremity with intermittent claudication ([place]) 11/26/2008  . Benign prostatic hyperplasia 10/20/2007  . PERIPHERAL VASCULAR DISEASE 05/19/2007  . Essential hypertension 02/01/2007  . Hyperlipidemia 01/03/2007    Past Surgical History:  Procedure Laterality Date  . abdominal surg for ulcers'  1980  . COLONOSCOPY N/A 09/22/2012   Procedure: COLONOSCOPY;  Surgeon: Gatha Mayer, MD;  Location: [facility];  Service: Endoscopy;  Laterality: N/A;  . distal aortogram    . IVC FILTER INSERTION N/A 06/11/2019   Procedure: IVC FILTER INSERTION;  Surgeon: Waynetta Sandy, MD;  Location: [place];  Service: Cardiovascular;  Laterality: N/A;  . IVC VENOGRAPHY N/A 06/11/2019   Procedure: IVC Venography;  Surgeon: Waynetta Sandy, MD;  Location: [place];  Service: Cardiovascular;  Laterality: N/A;  . percutaneous transluminal angionplasty with placement of 2 self expanding  stent in the distal left superfical femeral artery         Family History  Problem Relation Age of Onset  . Alcohol abuse Father   . Kidney disease Brother   . Drug abuse Brother   . Diabetes Brother   . Diabetes Mother   . Diabetes Brother   . Colon cancer Neg Hx     Social History   Tobacco Use  . Smoking status: Former Smoker    Packs/day: 0.25    Years: 50.00     Pack years: 12.50    Types: Cigarettes    Start date: 04/12/1957    Quit date: 04/12/2014    Years since quitting: 5.3  . Smokeless tobacco: Never Used  . Tobacco comment: using chatix; stopped now; took x 1 week / had stopped 3 years prior to starting back in 2005  Substance Use Topics  . Alcohol use: No    Alcohol/week: 0.0 standard drinks    Comment: OCC  . Drug use: Yes    Comment: States marijuanna relaxes him;     Home Medications Prior to Admission medications   Medication Sig Start Date End Date Taking? Authorizing Provider  ferrous sulfate 325 (65 FE) MG tablet Take 325 mg by mouth daily with breakfast.    [provider]  finasteride (PROSCAR) 5 MG tablet Take 5 mg by mouth daily.    [provider]  simvastatin (ZOCOR) 20 MG tablet Take 20 mg by mouth daily.    [provider]    Allergies    Ace inhibitors and Penicillins  Review of Systems   Review of Systems  All other systems reviewed and are negative.   Physical Exam Updated Vital Signs BP (!) 172/83   Pulse (!) 54   Temp (!) 97.4 F (36.3 C) (Oral)   Resp 15   SpO2 100%   Physical Exam Vitals and nursing note reviewed.  Constitutional:      Appearance: He is not toxic-appearing or diaphoretic.  HENT:     Head: Normocephalic and atraumatic.     Right Ear: External ear normal.     Left Ear: External ear normal.  Eyes:     General: No scleral icterus.       Right eye: No discharge.        Left eye: No discharge.     Conjunctiva/sclera: Conjunctivae normal.  Neck:     Trachea: No tracheal deviation.  Cardiovascular:     Rate and Rhythm: Normal rate and regular rhythm.  Pulmonary:     Effort: Pulmonary effort is normal. No respiratory distress.     Breath sounds: Normal breath sounds. No stridor. No wheezing or rales.  Abdominal:     General: Bowel sounds are normal. There is no distension.     Palpations: Abdomen is soft.     Tenderness: There is no abdominal  tenderness. There is no guarding or rebound.  Musculoskeletal:        General: No tenderness.     Cervical back: Neck supple.  Skin:    General: Skin is warm and dry.     Findings: No rash.  Neurological:     Mental Status: He is alert.     Cranial Nerves: No cranial nerve deficit (no facial droop, extraocular movements intact, no slurred speech).     Sensory: No sensory deficit.     Motor: No abnormal muscle tone or seizure activity.     Coordination: Coordination normal.  ED Results / Procedures / Treatments   Labs (all labs ordered are listed, but only abnormal results are displayed) Labs Reviewed  COMPREHENSIVE METABOLIC PANEL - Abnormal; Notable for the following components:      Result Value   CO2 21 (*)    Glucose, Bld 102 (*)    BUN 24 (*)    Creatinine, Ser 2.00 (*)    Calcium 8.6 (*)    Total Protein 5.6 (*)    Albumin 2.5 (*)    Total Bilirubin 0.2 (*)    GFR calc non Af Amer 31 (*)    GFR calc Af Amer 36 (*)    All other components within normal limits  CBC WITH DIFFERENTIAL/PLATELET - Abnormal; Notable for the following components:   RBC 3.19 (*)    Hemoglobin 8.8 (*)    HCT 28.3 (*)    RDW 20.2 (*)    All other components within normal limits  LIPASE, BLOOD  URINALYSIS, ROUTINE W REFLEX MICROSCOPIC    EKG None  Radiology No results found.  Procedures Procedures (including critical care time)  Medications Ordered in ED Medications - No data to display  ED Course  I have reviewed the triage vital signs and the nursing notes.  Pertinent labs & imaging results that were available during my care of the patient were reviewed by me and considered in my medical decision making (see chart for details).  Clinical Course as of Aug 16 1713  Fri Aug 17, 2019  1554 Bladder scan 173   [JK]  1658 CBC reviewed.  Anemia is stable   [JK]  1658 Patient's bladder scan does not show urinary retention.  With his abdominal pain we will proceed with CT scan     [JK]    Clinical Course User Index [JK] Dorie Rank, MD   MDM Rules/Calculators/A&P                      Patient presents ED with complaints of abdominal pain.  He has a history of urinary retention and hydronephrosis.  White blood cell count is not elevated.  Bladder scan does not show urinary tension.  With his age and complex history we will proceed with CT scan.  Care turned over to Dr Wilson Singer Final Clinical Impression(s) / ED Diagnoses Pending   Dorie Rank, MD 08/17/19 [phone]

## 2019-08-17 NOTE — Assessment & Plan Note (Signed)
Suspect the bladder issue is source of pain, may need consider repeat CT to r/o recurrent hydronephrosis or other etiology as well

## 2019-08-17 NOTE — ED Notes (Signed)
Pt wants to know if he's staying or not because he is ready to go home

## 2019-08-17 NOTE — ED Provider Notes (Signed)
CT with b/l hydronephrosis. Noted on prior imaging. Has chronic bladder outlet obstruction. Bladder scan with less than 200 cc. He is voiding. UA consistent with UTI. Send culture. Abx. Urology FU.    Virgel Manifold, MD 08/17/19 [phone]

## 2019-08-17 NOTE — ED Triage Notes (Signed)
Per EMS, patient from PCP, PCP called EMS for urinary retention. However patient and family reports patient last urinated this morning. C/o pain in lower abdomen. No distension noted.   A&Ox4. Ambulatory with cane.

## 2019-08-17 NOTE — Progress Notes (Signed)
Subjective:    Patient ID: Henry Carter, male    DOB: 1940/04/16, 79 y.o.   MRN: [id number]  HPI  Here to f/u with c/o lower abd pain, somewhat worse on the right, severe, without radiation, but has to strain to urinate and wife with him described crying out in pain 30 times in the past 24 hrs, maybe some dysuria as well.  While in office has urinary incontinence to small degree.  Denies urinary symptoms such as flank pain, hematuria or n/v, fever, chills. Has hx of LUTS requiring foley feb 2021, later removed   Pt denies chest pain, increased sob or doe, wheezing, orthopnea, PND, increased LE swelling, palpitations, dizziness or syncope.  Pt denies polydipsia, polyuria Past Medical History:  Diagnosis Date  .  iron deficiency 01/18/2013  . Anemia   . Angioedema   . BPH (benign prostatic hyperplasia)   . COPD (chronic obstructive pulmonary disease) ([place])    STates he has copd  . Essential hypertension   . GERD (gastroesophageal reflux disease)   . Helicobacter pylori gastritis 10/10/2012  . Hyperlipidemia   . IBS (irritable bowel syndrome)   . Impotence   . Peripheral vascular disease, unspecified ([place])   . Personal history of colonic adenoma 09/22/2012  . Personal history of tobacco use, presenting hazards to health    Past Surgical History:  Procedure Laterality Date  . abdominal surg for ulcers'  1980  . COLONOSCOPY N/A 09/22/2012   Procedure: COLONOSCOPY;  Surgeon: Gatha Mayer, MD;  [facility];  Service: Endoscopy;  Laterality: N/A;  . distal aortogram    . IVC FILTER INSERTION N/A 06/11/2019   Procedure: IVC FILTER INSERTION;  Surgeon: Waynetta Sandy, MD;  Location: [facility];  Service: Cardiovascular;  Laterality: N/A;  . IVC VENOGRAPHY N/A 06/11/2019   Procedure: IVC Venography;  Surgeon: Waynetta Sandy, MD;  Location: [facility];  Service: Cardiovascular;  Laterality: N/A;  . percutaneous transluminal angionplasty with  placement of 2 self expanding stent in the distal left superfical femeral artery      reports that he quit smoking about 5 years ago. His smoking use included cigarettes. He started smoking about 62 years ago. He has a 12.50 pack-year smoking history. He has never used smokeless tobacco. He reports current drug use. He reports that he does not drink alcohol. family history includes Alcohol abuse in his father; Diabetes in his brother, brother, and mother; Drug abuse in his brother; Kidney disease in his brother. Allergies  Allergen Reactions  . Ace Inhibitors     REACTION: angioedema  . Penicillins Hives    Has patient had a PCN reaction causing immediate rash, facial/tongue/throat swelling, SOB or lightheadedness with hypotension: No Has patient had a PCN reaction causing severe rash involving mucus membranes or skin necrosis: No Has patient had a PCN reaction that required hospitalization yes Has patient had a PCN reaction occurring within the last 10 years: no If all of the above answers are "NO", then may proceed with Cephalosporin use.   Current Outpatient Medications on File Prior to Visit  Medication Sig Dispense Refill  . ferrous sulfate 325 (65 FE) MG tablet Take 325 mg by mouth daily with breakfast.    . finasteride (PROSCAR) 5 MG tablet Take 5 mg by mouth daily.    . simvastatin (ZOCOR) 20 MG tablet Take 20 mg by mouth daily.     No current facility-administered medications on file prior to visit.  Review of Systems All otherwise neg per pt     Objective:   Physical Exam BP (!) 160/90 (BP Location: Left Arm, Patient Position: Sitting, Cuff Size: Small)   Pulse 79   Temp 97.8 F (36.6 C) (Oral)   Ht 5\' 10"  (1.778 m)   SpO2 98%   BMI 18.13 kg/m  VS noted,  Constitutional: Pt appears in NAD HENT: Head: NCAT.  Right Ear: External ear normal.  Left Ear: External ear normal.  Eyes: . Pupils are equal, round, and reactive to light. Conjunctivae and EOM are normal Nose:  without d/c or deformity Neck: Neck supple. Gross normal ROM Cardiovascular: Normal rate and regular rhythm.   Pulmonary/Chest: Effort normal and breath sounds without rales or wheezing.  Abd:  Soft, ND, + BS, no organomegaly with enlarged bladder on palpation, mild tender at best, no guarding or rebound but groans in severe pain Neurological: Pt is alert. At baseline orientation, motor grossly intact Skin: Skin is warm. No rashes, other new lesions, no LE edema Psychiatric: Pt behavior is normal without agitation  All otherwise neg per pt Lab Results  Component Value Date   WBC 6.3 07/19/2019   HGB 9.5 (L) 07/19/2019   HCT 28.3 (L) 07/19/2019   PLT 385.0 07/19/2019   GLUCOSE 146 (H) 07/19/2019   CHOL 225 (H) 07/11/2018   TRIG 83.0 07/11/2018   HDL 60.50 07/11/2018   LDLDIRECT 145.2 11/20/2010   LDLCALC 148 (H) 07/11/2018   ALT 27 07/19/2019   AST 23 07/19/2019   NA 133 (L) 07/19/2019   K 3.8 07/19/2019   CL 102 07/19/2019   CREATININE 1.29 07/19/2019   BUN 21 07/19/2019   CO2 24 07/19/2019   TSH 1.102 06/09/2019   PSA 30.36 (H) 08/01/2017   INR 2.6 (H) 06/09/2019   HGBA1C 6.1 07/11/2018   CT ABDOMEN PELVIS WO CONTRAST (Accession [id number]) (Order [id number]) Imaging Date: 06/09/2019   Summary - IMPRESSION: 1. Bilateral obstructive uropathy, left greater than right, which may be due to bladder distension and chronic bladder outlet obstruction. No evidence of urinary tract calculi. 2. Distended bladder despite Foley catheter. Please correlate with catheter function. Diffuse mural calcifications of the bladder without filling defect or mass. 3. Enlarged prostate. 4. 4 cm infrarenal abdominal aortic aneurysm. Recommend followup by ultrasound in 1 year. This recommendation follows ACR consensus guidelines: White Paper of the ACR Incidental Findings Committee II on Vascular Findings. J Am Coll Radiol 2013; 10:789-794. Aortic aneurysm NOS (ICD10-I71.9) 5. Trace pelvic free  fluid. 6. Perirectal fat stranding of uncertain significance.   Electronically Signed   By: Randa Ngo M.D.   On: 06/09/2019 21:29    Assessment & Plan:

## 2019-08-17 NOTE — Patient Instructions (Signed)
Please go to ED  now

## 2019-08-17 NOTE — Assessment & Plan Note (Addendum)
Has bladder enlargement by palpation, and severe pain c/w outlet obstruction, likely needs bladder scan confirmation, and urgent foley placement - to ED now  I spent 31 minutes in preparing to see the patient by review of recent labs, imaging and procedures, obtaining and reviewing separately obtained history, communicating with the patient and family or caregiver, ordering medications, tests or procedures, and documenting clinical information in the EHR including the differential Dx, treatment, and any further evaluation and other management of bladder outlet obstruction, abd pain

## 2019-08-17 NOTE — ED Notes (Signed)
With patient permission, patient wife provided with update upon her calling.

## 2019-08-19 LAB — URINE CULTURE: Culture: <10000 — AB

## 2019-08-22 ENCOUNTER — Telehealth: Payer: Self-pay | Admitting: Internal Medicine

## 2019-08-22 NOTE — Telephone Encounter (Signed)
Henry Carter with [facility] called and  said that another nurse called last week and Henry Carter is saying that he has been having severe pain in his abdomin from hydronephrosis that was confirmed in a CT Friday night, the wife has given him tylenol and was wondering if there was a way that Dr. [name] could possibly give him something stronger. He no longer has a foley because he pulled it out 3 times in one week, with no urine retention.

## 2019-08-23 DIAGNOSIS — N138 Other obstructive and reflux uropathy: Secondary | ICD-10-CM | POA: Diagnosis not present

## 2019-08-23 DIAGNOSIS — N179 Acute kidney failure, unspecified: Secondary | ICD-10-CM | POA: Diagnosis not present

## 2019-08-23 NOTE — Telephone Encounter (Signed)
Patient was seen on 08-17-2019 by Dr. Jenny Reichmann.    Should he still schedule a follow up?

## 2019-08-23 NOTE — Telephone Encounter (Signed)
We would not be able to treat him without seeing him.

## 2019-08-23 NOTE — Telephone Encounter (Signed)
Message can be routed to John to see if he is comfortable prescribing additional pain medications but if he is wanting pain medication from me he would need a visit to assess.

## 2019-08-23 NOTE — Telephone Encounter (Signed)
See message.

## 2019-08-24 MED ORDER — TRAMADOL HCL 50 MG PO TABS: 50.0000 mg | ORAL_TABLET | Freq: Four times a day (QID) | ORAL | 0 refills | Status: DC | PRN

## 2019-08-24 NOTE — Telephone Encounter (Signed)
Tramadol done erx - would need ROV for refills

## 2019-08-24 NOTE — Telephone Encounter (Signed)
Dr. Jenny Reichmann please see below message

## 2019-08-24 NOTE — Telephone Encounter (Signed)
Called Ria Comment back there was no answer LMOM w/MD response.Marland KitchenJohny Chess

## 2019-09-22 DIAGNOSIS — K219 Gastro-esophageal reflux disease without esophagitis: Secondary | ICD-10-CM | POA: Diagnosis not present

## 2019-09-22 DIAGNOSIS — I10 Essential (primary) hypertension: Secondary | ICD-10-CM | POA: Diagnosis not present

## 2019-09-22 DIAGNOSIS — E43 Unspecified severe protein-calorie malnutrition: Secondary | ICD-10-CM | POA: Diagnosis not present

## 2019-09-22 DIAGNOSIS — K5909 Other constipation: Secondary | ICD-10-CM | POA: Diagnosis not present

## 2019-09-23 DIAGNOSIS — N179 Acute kidney failure, unspecified: Secondary | ICD-10-CM | POA: Diagnosis not present

## 2019-09-23 DIAGNOSIS — N138 Other obstructive and reflux uropathy: Secondary | ICD-10-CM | POA: Diagnosis not present

## 2019-09-28 ENCOUNTER — Telehealth: Payer: Self-pay | Admitting: Internal Medicine

## 2019-09-28 NOTE — Telephone Encounter (Signed)
Henry Carter called with Henry Carter about a comfort care fax that was sent over last week.  Asked Henry Carter to re-fax since fax machine was down next week

## 2019-10-05 DIAGNOSIS — E43 Unspecified severe protein-calorie malnutrition: Secondary | ICD-10-CM | POA: Diagnosis not present

## 2019-10-05 DIAGNOSIS — I10 Essential (primary) hypertension: Secondary | ICD-10-CM | POA: Diagnosis not present

## 2019-10-12 ENCOUNTER — Ambulatory Visit: Payer: Medicare Other | Admitting: Vascular Surgery

## 2019-10-23 ENCOUNTER — Encounter: Payer: Self-pay | Admitting: Surgery

## 2019-10-23 DIAGNOSIS — N179 Acute kidney failure, unspecified: Secondary | ICD-10-CM | POA: Diagnosis not present

## 2019-10-23 DIAGNOSIS — N138 Other obstructive and reflux uropathy: Secondary | ICD-10-CM | POA: Diagnosis not present

## 2019-11-08 ENCOUNTER — Other Ambulatory Visit: Payer: Self-pay | Admitting: Internal Medicine

## 2019-11-08 NOTE — Telephone Encounter (Signed)
Done erx 

## 2019-11-24 DIAGNOSIS — E43 Unspecified severe protein-calorie malnutrition: Secondary | ICD-10-CM | POA: Diagnosis not present

## 2019-11-24 DIAGNOSIS — R52 Pain, unspecified: Secondary | ICD-10-CM | POA: Diagnosis not present

## 2019-11-24 DIAGNOSIS — I10 Essential (primary) hypertension: Secondary | ICD-10-CM | POA: Diagnosis not present

## 2019-11-24 DIAGNOSIS — K5909 Other constipation: Secondary | ICD-10-CM | POA: Diagnosis not present

## 2020-10-21 ENCOUNTER — Ambulatory Visit: Payer: Medicare HMO | Admitting: Internal Medicine

## 2020-11-13 ENCOUNTER — Ambulatory Visit: Payer: Medicare HMO | Admitting: Internal Medicine

## 2020-11-13 ENCOUNTER — Ambulatory Visit: Payer: Medicare HMO

## 2020-11-14 ENCOUNTER — Ambulatory Visit: Payer: Medicare HMO

## 2020-11-14 ENCOUNTER — Encounter: Payer: Self-pay | Admitting: Internal Medicine

## 2020-11-14 ENCOUNTER — Telehealth: Payer: Medicare HMO | Admitting: Internal Medicine

## 2020-11-14 NOTE — Progress Notes (Signed)
Virtual Visit via Video Note  I connected with Berneta Levins on 11/14/20 at  3:00 PM EDT by a video enabled telemedicine application however he did not show up and we called the listed number and his wife was not aware about this and hung up once she learned about the ramifications.   [name], MD

## 2020-11-27 ENCOUNTER — Inpatient Hospital Stay (HOSPITAL_COMMUNITY)
Admission: EM | Admit: 2020-11-27 | Discharge: 2020-12-04 | DRG: 871 | Disposition: A | Discharge status: Home Health | Payer: Medicare HMO | Attending: Internal Medicine | Admitting: Internal Medicine

## 2020-11-27 ENCOUNTER — Encounter (HOSPITAL_COMMUNITY): Payer: Self-pay | Admitting: Emergency Medicine

## 2020-11-27 ENCOUNTER — Emergency Department (HOSPITAL_COMMUNITY): Payer: Medicare HMO

## 2020-11-27 ENCOUNTER — Other Ambulatory Visit: Payer: Self-pay

## 2020-11-27 ENCOUNTER — Inpatient Hospital Stay (HOSPITAL_COMMUNITY): Payer: Medicare HMO

## 2020-11-27 ENCOUNTER — Telehealth: Payer: Self-pay | Admitting: Emergency Medicine

## 2020-11-27 DIAGNOSIS — D500 Iron deficiency anemia secondary to blood loss (chronic): Secondary | ICD-10-CM

## 2020-11-27 DIAGNOSIS — R197 Diarrhea, unspecified: Secondary | ICD-10-CM

## 2020-11-27 DIAGNOSIS — I513 Intracardiac thrombosis, not elsewhere classified: Secondary | ICD-10-CM | POA: Diagnosis not present

## 2020-11-27 DIAGNOSIS — Z66 Do not resuscitate: Secondary | ICD-10-CM | POA: Diagnosis not present

## 2020-11-27 DIAGNOSIS — N136 Pyonephrosis: Secondary | ICD-10-CM | POA: Diagnosis present

## 2020-11-27 DIAGNOSIS — I739 Peripheral vascular disease, unspecified: Secondary | ICD-10-CM | POA: Diagnosis present

## 2020-11-27 DIAGNOSIS — E875 Hyperkalemia: Secondary | ICD-10-CM

## 2020-11-27 DIAGNOSIS — Z811 Family history of alcohol abuse and dependence: Secondary | ICD-10-CM

## 2020-11-27 DIAGNOSIS — W19XXXA Unspecified fall, initial encounter: Secondary | ICD-10-CM | POA: Diagnosis not present

## 2020-11-27 DIAGNOSIS — R627 Adult failure to thrive: Secondary | ICD-10-CM

## 2020-11-27 DIAGNOSIS — I82451 Acute embolism and thrombosis of right peroneal vein: Secondary | ICD-10-CM | POA: Diagnosis present

## 2020-11-27 DIAGNOSIS — A4189 Other specified sepsis: Secondary | ICD-10-CM | POA: Diagnosis not present

## 2020-11-27 DIAGNOSIS — D6959 Other secondary thrombocytopenia: Secondary | ICD-10-CM | POA: Diagnosis present

## 2020-11-27 DIAGNOSIS — Z7189 Other specified counseling: Secondary | ICD-10-CM | POA: Diagnosis not present

## 2020-11-27 DIAGNOSIS — I10 Essential (primary) hypertension: Secondary | ICD-10-CM | POA: Diagnosis not present

## 2020-11-27 DIAGNOSIS — Z95820 Peripheral vascular angioplasty status with implants and grafts: Secondary | ICD-10-CM

## 2020-11-27 DIAGNOSIS — N179 Acute kidney failure, unspecified: Secondary | ICD-10-CM | POA: Diagnosis not present

## 2020-11-27 DIAGNOSIS — Z79899 Other long term (current) drug therapy: Secondary | ICD-10-CM

## 2020-11-27 DIAGNOSIS — I82409 Acute embolism and thrombosis of unspecified deep veins of unspecified lower extremity: Secondary | ICD-10-CM | POA: Diagnosis not present

## 2020-11-27 DIAGNOSIS — N138 Other obstructive and reflux uropathy: Secondary | ICD-10-CM | POA: Diagnosis not present

## 2020-11-27 DIAGNOSIS — B967 Clostridium perfringens [C. perfringens] as the cause of diseases classified elsewhere: Secondary | ICD-10-CM | POA: Diagnosis present

## 2020-11-27 DIAGNOSIS — Z841 Family history of disorders of kidney and ureter: Secondary | ICD-10-CM

## 2020-11-27 DIAGNOSIS — E785 Hyperlipidemia, unspecified: Secondary | ICD-10-CM | POA: Diagnosis not present

## 2020-11-27 DIAGNOSIS — I82441 Acute embolism and thrombosis of right tibial vein: Secondary | ICD-10-CM | POA: Diagnosis present

## 2020-11-27 DIAGNOSIS — Z888 Allergy status to other drugs, medicaments and biological substances status: Secondary | ICD-10-CM

## 2020-11-27 DIAGNOSIS — N390 Urinary tract infection, site not specified: Secondary | ICD-10-CM

## 2020-11-27 DIAGNOSIS — R64 Cachexia: Secondary | ICD-10-CM | POA: Diagnosis present

## 2020-11-27 DIAGNOSIS — I82413 Acute embolism and thrombosis of femoral vein, bilateral: Secondary | ICD-10-CM | POA: Diagnosis present

## 2020-11-27 DIAGNOSIS — Z87891 Personal history of nicotine dependence: Secondary | ICD-10-CM

## 2020-11-27 DIAGNOSIS — R531 Weakness: Secondary | ICD-10-CM

## 2020-11-27 DIAGNOSIS — Z20822 Contact with and (suspected) exposure to covid-19: Secondary | ICD-10-CM | POA: Diagnosis present

## 2020-11-27 DIAGNOSIS — R001 Bradycardia, unspecified: Secondary | ICD-10-CM | POA: Diagnosis not present

## 2020-11-27 DIAGNOSIS — N281 Cyst of kidney, acquired: Secondary | ICD-10-CM | POA: Diagnosis not present

## 2020-11-27 DIAGNOSIS — A419 Sepsis, unspecified organism: Secondary | ICD-10-CM | POA: Diagnosis not present

## 2020-11-27 DIAGNOSIS — R7881 Bacteremia: Secondary | ICD-10-CM | POA: Diagnosis not present

## 2020-11-27 DIAGNOSIS — E162 Hypoglycemia, unspecified: Secondary | ICD-10-CM | POA: Diagnosis not present

## 2020-11-27 DIAGNOSIS — Z978 Presence of other specified devices: Secondary | ICD-10-CM | POA: Diagnosis not present

## 2020-11-27 DIAGNOSIS — D638 Anemia in other chronic diseases classified elsewhere: Secondary | ICD-10-CM | POA: Diagnosis present

## 2020-11-27 DIAGNOSIS — E43 Unspecified severe protein-calorie malnutrition: Secondary | ICD-10-CM | POA: Diagnosis not present

## 2020-11-27 DIAGNOSIS — K7200 Acute and subacute hepatic failure without coma: Secondary | ICD-10-CM | POA: Diagnosis present

## 2020-11-27 DIAGNOSIS — I714 Abdominal aortic aneurysm, without rupture: Secondary | ICD-10-CM | POA: Diagnosis not present

## 2020-11-27 DIAGNOSIS — R739 Hyperglycemia, unspecified: Secondary | ICD-10-CM | POA: Diagnosis not present

## 2020-11-27 DIAGNOSIS — T68XXXA Hypothermia, initial encounter: Secondary | ICD-10-CM | POA: Diagnosis not present

## 2020-11-27 DIAGNOSIS — E871 Hypo-osmolality and hyponatremia: Secondary | ICD-10-CM | POA: Diagnosis present

## 2020-11-27 DIAGNOSIS — N1330 Unspecified hydronephrosis: Secondary | ICD-10-CM | POA: Diagnosis not present

## 2020-11-27 DIAGNOSIS — I82431 Acute embolism and thrombosis of right popliteal vein: Secondary | ICD-10-CM | POA: Diagnosis present

## 2020-11-27 DIAGNOSIS — Z95828 Presence of other vascular implants and grafts: Secondary | ICD-10-CM

## 2020-11-27 DIAGNOSIS — R600 Localized edema: Secondary | ICD-10-CM | POA: Diagnosis not present

## 2020-11-27 DIAGNOSIS — Z681 Body mass index (BMI) 19 or less, adult: Secondary | ICD-10-CM

## 2020-11-27 DIAGNOSIS — E876 Hypokalemia: Secondary | ICD-10-CM | POA: Diagnosis not present

## 2020-11-27 DIAGNOSIS — N401 Enlarged prostate with lower urinary tract symptoms: Secondary | ICD-10-CM

## 2020-11-27 DIAGNOSIS — T68XXXD Hypothermia, subsequent encounter: Secondary | ICD-10-CM | POA: Diagnosis not present

## 2020-11-27 DIAGNOSIS — J449 Chronic obstructive pulmonary disease, unspecified: Secondary | ICD-10-CM | POA: Diagnosis present

## 2020-11-27 DIAGNOSIS — E872 Acidosis, unspecified: Secondary | ICD-10-CM | POA: Diagnosis present

## 2020-11-27 DIAGNOSIS — N400 Enlarged prostate without lower urinary tract symptoms: Secondary | ICD-10-CM | POA: Diagnosis present

## 2020-11-27 DIAGNOSIS — L89890 Pressure ulcer of other site, unstageable: Secondary | ICD-10-CM | POA: Diagnosis present

## 2020-11-27 DIAGNOSIS — E861 Hypovolemia: Secondary | ICD-10-CM | POA: Diagnosis present

## 2020-11-27 DIAGNOSIS — I7409 Other arterial embolism and thrombosis of abdominal aorta: Secondary | ICD-10-CM | POA: Diagnosis not present

## 2020-11-27 DIAGNOSIS — Z515 Encounter for palliative care: Secondary | ICD-10-CM

## 2020-11-27 DIAGNOSIS — Z9119 Patient's noncompliance with other medical treatment and regimen: Secondary | ICD-10-CM

## 2020-11-27 DIAGNOSIS — R6520 Severe sepsis without septic shock: Secondary | ICD-10-CM | POA: Diagnosis not present

## 2020-11-27 DIAGNOSIS — Z880 Allergy status to penicillin: Secondary | ICD-10-CM

## 2020-11-27 DIAGNOSIS — E8809 Other disorders of plasma-protein metabolism, not elsewhere classified: Secondary | ICD-10-CM | POA: Diagnosis present

## 2020-11-27 DIAGNOSIS — Z86718 Personal history of other venous thrombosis and embolism: Secondary | ICD-10-CM

## 2020-11-27 DIAGNOSIS — E860 Dehydration: Secondary | ICD-10-CM

## 2020-11-27 DIAGNOSIS — Z813 Family history of other psychoactive substance abuse and dependence: Secondary | ICD-10-CM

## 2020-11-27 DIAGNOSIS — D61818 Other pancytopenia: Secondary | ICD-10-CM | POA: Diagnosis present

## 2020-11-27 DIAGNOSIS — R7401 Elevation of levels of liver transaminase levels: Secondary | ICD-10-CM | POA: Diagnosis not present

## 2020-11-27 DIAGNOSIS — R680 Hypothermia, not associated with low environmental temperature: Secondary | ICD-10-CM | POA: Diagnosis present

## 2020-11-27 DIAGNOSIS — Z833 Family history of diabetes mellitus: Secondary | ICD-10-CM

## 2020-11-27 DIAGNOSIS — E161 Other hypoglycemia: Secondary | ICD-10-CM | POA: Diagnosis not present

## 2020-11-27 DIAGNOSIS — Z7401 Bed confinement status: Secondary | ICD-10-CM | POA: Diagnosis not present

## 2020-11-27 DIAGNOSIS — Z043 Encounter for examination and observation following other accident: Secondary | ICD-10-CM | POA: Diagnosis not present

## 2020-11-27 DIAGNOSIS — R6521 Severe sepsis with septic shock: Secondary | ICD-10-CM | POA: Diagnosis present

## 2020-11-27 DIAGNOSIS — I82401 Acute embolism and thrombosis of unspecified deep veins of right lower extremity: Secondary | ICD-10-CM | POA: Diagnosis present

## 2020-11-27 DIAGNOSIS — R54 Age-related physical debility: Secondary | ICD-10-CM | POA: Diagnosis present

## 2020-11-27 DIAGNOSIS — K219 Gastro-esophageal reflux disease without esophagitis: Secondary | ICD-10-CM | POA: Diagnosis present

## 2020-11-27 DIAGNOSIS — N132 Hydronephrosis with renal and ureteral calculous obstruction: Secondary | ICD-10-CM | POA: Diagnosis not present

## 2020-11-27 DIAGNOSIS — R4020 Unspecified coma: Secondary | ICD-10-CM | POA: Diagnosis not present

## 2020-11-27 DIAGNOSIS — R609 Edema, unspecified: Secondary | ICD-10-CM | POA: Diagnosis not present

## 2020-11-27 LAB — CBC WITH DIFFERENTIAL/PLATELET
Abs Immature Granulocytes: 0.06 10*3/uL (ref 0.00–0.07)
Basophils Absolute: 0 10*3/uL (ref 0.0–0.1)
Basophils Relative: 0 %
Eosinophils Absolute: 0 10*3/uL (ref 0.0–0.5)
Eosinophils Relative: 0 %
HCT: 28.2 % — ABNORMAL LOW (ref 39.0–52.0)
Hemoglobin: 8.6 g/dL — ABNORMAL LOW (ref 13.0–17.0)
Immature Granulocytes: 1 %
Lymphocytes Relative: 0 %
Lymphs Abs: 0 10*3/uL — ABNORMAL LOW (ref 0.7–4.0)
MCH: 24.5 pg — ABNORMAL LOW (ref 26.0–34.0)
MCHC: 30.5 g/dL (ref 30.0–36.0)
MCV: 80.3 fL (ref 80.0–100.0)
Monocytes Absolute: 0.2 10*3/uL (ref 0.1–1.0)
Monocytes Relative: 4 %
Neutro Abs: 5 10*3/uL (ref 1.7–7.7)
Neutrophils Relative %: 95 %
Platelets: 161 10*3/uL (ref 150–400)
RBC: 3.51 MIL/uL — ABNORMAL LOW (ref 4.22–5.81)
RDW: 23.9 % — ABNORMAL HIGH (ref 11.5–15.5)
WBC: 5.2 10*3/uL (ref 4.0–10.5)
nRBC: 14.9 % — ABNORMAL HIGH (ref 0.0–0.2)

## 2020-11-27 LAB — PROCALCITONIN: Procalcitonin: 4.5 ng/mL

## 2020-11-27 LAB — URINALYSIS, ROUTINE W REFLEX MICROSCOPIC
Bilirubin Urine: NEGATIVE
Glucose, UA: NEGATIVE mg/dL
Ketones, ur: 5 mg/dL — AB
Nitrite: NEGATIVE
Protein, ur: 100 mg/dL — AB
Specific Gravity, Urine: 1.011 (ref 1.005–1.030)
pH: 6 (ref 5.0–8.0)

## 2020-11-27 LAB — PROTIME-INR
INR: 1.1 (ref 0.8–1.2)
Prothrombin Time: 14.4 seconds (ref 11.4–15.2)

## 2020-11-27 LAB — COMPREHENSIVE METABOLIC PANEL
ALT: 32 U/L (ref 0–44)
AST: 64 U/L — ABNORMAL HIGH (ref 15–41)
Albumin: 2.6 g/dL — ABNORMAL LOW (ref 3.5–5.0)
Alkaline Phosphatase: 92 U/L (ref 38–126)
Anion gap: 14 (ref 5–15)
BUN: 86 mg/dL — ABNORMAL HIGH (ref 8–23)
CO2: 14 mmol/L — ABNORMAL LOW (ref 22–32)
Calcium: 8.2 mg/dL — ABNORMAL LOW (ref 8.9–10.3)
Chloride: 104 mmol/L (ref 98–111)
Creatinine, Ser: 2.66 mg/dL — ABNORMAL HIGH (ref 0.61–1.24)
GFR, Estimated: 24 mL/min — ABNORMAL LOW (ref >60)
Glucose, Bld: 256 mg/dL — ABNORMAL HIGH (ref 70–99)
Potassium: 5.2 mmol/L — ABNORMAL HIGH (ref 3.5–5.1)
Sodium: 132 mmol/L — ABNORMAL LOW (ref 135–145)
Total Bilirubin: 1.1 mg/dL (ref 0.3–1.2)
Total Protein: 5.4 g/dL — ABNORMAL LOW (ref 6.5–8.1)

## 2020-11-27 LAB — BASIC METABOLIC PANEL
Anion gap: 11 (ref 5–15)
BUN: 75 mg/dL — ABNORMAL HIGH (ref 8–23)
CO2: 15 mmol/L — ABNORMAL LOW (ref 22–32)
Calcium: 8 mg/dL — ABNORMAL LOW (ref 8.9–10.3)
Chloride: 107 mmol/L (ref 98–111)
Creatinine, Ser: 2.51 mg/dL — ABNORMAL HIGH (ref 0.61–1.24)
GFR, Estimated: 25 mL/min — ABNORMAL LOW (ref >60)
Glucose, Bld: 130 mg/dL — ABNORMAL HIGH (ref 70–99)
Potassium: 4.7 mmol/L (ref 3.5–5.1)
Sodium: 133 mmol/L — ABNORMAL LOW (ref 135–145)

## 2020-11-27 LAB — IRON AND TIBC
Iron: 43 ug/dL — ABNORMAL LOW (ref 45–182)
Saturation Ratios: 15 % — ABNORMAL LOW (ref 17.9–39.5)
TIBC: 289 ug/dL (ref 250–450)
UIBC: 246 ug/dL

## 2020-11-27 LAB — FERRITIN: Ferritin: 192 ng/mL (ref 24–336)

## 2020-11-27 LAB — URINALYSIS, MICROSCOPIC (REFLEX): WBC, UA: >50 WBC/hpf (ref 0–5)

## 2020-11-27 LAB — CBG MONITORING, ED
Glucose-Capillary: 54 mg/dL — ABNORMAL LOW (ref 70–99)
Glucose-Capillary: 139 mg/dL — ABNORMAL HIGH (ref 70–99)

## 2020-11-27 LAB — MAGNESIUM: Magnesium: 2.1 mg/dL (ref 1.7–2.4)

## 2020-11-27 LAB — LACTIC ACID, PLASMA
Lactic Acid, Venous: 3.9 mmol/L (ref 0.5–1.9)
Lactic Acid, Venous: >11 mmol/L (ref 0.5–1.9)
Lactic Acid, Venous: 2.1 mmol/L (ref 0.5–1.9)

## 2020-11-27 LAB — CREATININE, URINE, RANDOM: Creatinine, Urine: 22.66 mg/dL

## 2020-11-27 LAB — RETICULOCYTES
Immature Retic Fract: 25.2 % — ABNORMAL HIGH (ref 2.3–15.9)
RBC.: 2.97 MIL/uL — ABNORMAL LOW (ref 4.22–5.81)
Retic Count, Absolute: 52.6 10*3/uL (ref 19.0–186.0)
Retic Ct Pct: 1.8 % (ref 0.4–3.1)

## 2020-11-27 LAB — VITAMIN B12: Vitamin B-12: 976 pg/mL — ABNORMAL HIGH (ref 180–914)

## 2020-11-27 LAB — RESP PANEL BY RT-PCR (FLU A&B, COVID) ARPGX2
Influenza A by PCR: NEGATIVE
Influenza B by PCR: NEGATIVE
SARS Coronavirus 2 by RT PCR: NEGATIVE

## 2020-11-27 LAB — PHOSPHORUS: Phosphorus: 5.7 mg/dL — ABNORMAL HIGH (ref 2.5–4.6)

## 2020-11-27 LAB — C DIFFICILE QUICK SCREEN W PCR REFLEX
C Diff antigen: NEGATIVE
C Diff interpretation: NOT DETECTED
C Diff toxin: NEGATIVE

## 2020-11-27 LAB — APTT: aPTT: 31 seconds (ref 24–36)

## 2020-11-27 LAB — FOLATE: Folate: 7.3 ng/mL (ref >5.9)

## 2020-11-27 LAB — SODIUM, URINE, RANDOM: Sodium, Ur: 48 mmol/L

## 2020-11-27 LAB — TSH: TSH: 4.521 u[IU]/mL — ABNORMAL HIGH (ref 0.350–4.500)

## 2020-11-27 MED ORDER — ALBUMIN HUMAN 25 % IV SOLN
25.0000 g | Freq: Once | INTRAVENOUS | Status: AC
  Filled 2020-11-27: qty 100

## 2020-11-27 MED ORDER — SODIUM CHLORIDE 0.9 % IV SOLN: 2.0000 g | Freq: Once | INTRAVENOUS | Status: DC

## 2020-11-27 MED ORDER — LACTATED RINGERS IV SOLN: INTRAVENOUS | Status: DC

## 2020-11-27 MED ORDER — UMECLIDINIUM BROMIDE 62.5 MCG/INH IN AEPB
1.0000 | INHALATION_SPRAY | Freq: Every day | RESPIRATORY_TRACT | Status: DC
  Filled 2020-11-27: qty 7

## 2020-11-27 MED ORDER — SODIUM CHLORIDE 0.9 % IV BOLUS
1000.0000 mL | Freq: Once | INTRAVENOUS | Status: AC
  Administered 2020-11-27: 1000 mL via INTRAVENOUS

## 2020-11-27 MED ORDER — SODIUM CHLORIDE 0.9 % IV BOLUS
500.0000 mL | Freq: Once | INTRAVENOUS | Status: AC
  Administered 2020-11-27: 500 mL via INTRAVENOUS

## 2020-11-27 MED ORDER — ALBUMIN HUMAN 25 % IV SOLN
25.0000 g | Freq: Once | INTRAVENOUS | Status: AC
  Administered 2020-11-27: 25 g via INTRAVENOUS
  Filled 2020-11-27: qty 100

## 2020-11-27 MED ORDER — VANCOMYCIN HCL 1250 MG/250ML IV SOLN
1250.0000 mg | Freq: Once | INTRAVENOUS | Status: AC
  Administered 2020-11-27: 1250 mg via INTRAVENOUS
  Filled 2020-11-27: qty 250

## 2020-11-27 MED ORDER — UMECLIDINIUM BROMIDE 62.5 MCG/INH IN AEPB
1.0000 | INHALATION_SPRAY | Freq: Every day | RESPIRATORY_TRACT | Status: DC
  Administered 2020-11-29 – 2020-12-04 (×6): 1 via RESPIRATORY_TRACT
  Filled 2020-11-27 (×2): qty 7

## 2020-11-27 MED ORDER — METOPROLOL TARTRATE 5 MG/5ML IV SOLN
5.0000 mg | Freq: Four times a day (QID) | INTRAVENOUS | Status: DC | PRN
  Filled 2020-11-27: qty 5

## 2020-11-27 MED ORDER — SORBITOL 70 % SOLN: 30.0000 mL | Freq: Every day | Status: DC | PRN

## 2020-11-27 MED ORDER — LACTATED RINGERS IV BOLUS (SEPSIS)
1000.0000 mL | Freq: Once | INTRAVENOUS | Status: AC
  Administered 2020-11-27: 1000 mL via INTRAVENOUS

## 2020-11-27 MED ORDER — ALBUTEROL SULFATE (2.5 MG/3ML) 0.083% IN NEBU: 2.5000 mg | INHALATION_SOLUTION | RESPIRATORY_TRACT | Status: DC | PRN

## 2020-11-27 MED ORDER — SODIUM CHLORIDE 0.9 % IV SOLN: INTRAVENOUS | Status: DC

## 2020-11-27 MED ORDER — FINASTERIDE 5 MG PO TABS
5.0000 mg | ORAL_TABLET | Freq: Every day | ORAL | Status: DC
  Administered 2020-11-27 – 2020-12-04 (×7): 5 mg via ORAL
  Filled 2020-11-27 (×7): qty 1

## 2020-11-27 MED ORDER — SENNOSIDES-DOCUSATE SODIUM 8.6-50 MG PO TABS: 1.0000 | ORAL_TABLET | Freq: Every evening | ORAL | Status: DC | PRN

## 2020-11-27 MED ORDER — ONDANSETRON HCL 4 MG PO TABS: 4.0000 mg | ORAL_TABLET | Freq: Four times a day (QID) | ORAL | Status: DC | PRN

## 2020-11-27 MED ORDER — IPRATROPIUM BROMIDE 0.02 % IN SOLN: 0.5000 mg | RESPIRATORY_TRACT | Status: DC | PRN

## 2020-11-27 MED ORDER — SODIUM CHLORIDE 0.9% FLUSH
3.0000 mL | Freq: Two times a day (BID) | INTRAVENOUS | Status: DC
  Administered 2020-11-27 – 2020-12-04 (×13): 3 mL via INTRAVENOUS

## 2020-11-27 MED ORDER — ACETAMINOPHEN 650 MG RE SUPP: 650.0000 mg | Freq: Four times a day (QID) | RECTAL | Status: DC | PRN

## 2020-11-27 MED ORDER — PANTOPRAZOLE SODIUM 40 MG PO TBEC: 40.0000 mg | DELAYED_RELEASE_TABLET | Freq: Every day | ORAL | Status: DC

## 2020-11-27 MED ORDER — DEXTROSE 50 % IV SOLN
50.0000 mL | Freq: Once | INTRAVENOUS | Status: AC
  Administered 2020-11-27: 50 mL via INTRAVENOUS

## 2020-11-27 MED ORDER — SODIUM CHLORIDE 0.9 % IV SOLN
2.0000 g | Freq: Once | INTRAVENOUS | Status: AC
  Administered 2020-11-27: 2 g via INTRAVENOUS
  Filled 2020-11-27: qty 2

## 2020-11-27 MED ORDER — VANCOMYCIN HCL IN DEXTROSE 1-5 GM/200ML-% IV SOLN: 1000.0000 mg | Freq: Once | INTRAVENOUS | Status: DC

## 2020-11-27 MED ORDER — METRONIDAZOLE 500 MG/100ML IV SOLN
500.0000 mg | Freq: Two times a day (BID) | INTRAVENOUS | Status: DC
  Administered 2020-11-27 – 2020-11-30 (×6): 500 mg via INTRAVENOUS
  Filled 2020-11-27 (×6): qty 100

## 2020-11-27 MED ORDER — ACETAMINOPHEN 325 MG PO TABS
650.0000 mg | ORAL_TABLET | Freq: Four times a day (QID) | ORAL | Status: DC | PRN
  Administered 2020-11-28 – 2020-12-02 (×2): 650 mg via ORAL
  Filled 2020-11-27: qty 2

## 2020-11-27 MED ORDER — DEXTROSE 50 % IV SOLN
INTRAVENOUS | Status: AC
  Filled 2020-11-27: qty 50

## 2020-11-27 MED ORDER — HEPARIN SODIUM (PORCINE) 5000 UNIT/ML IJ SOLN
5000.0000 [IU] | Freq: Three times a day (TID) | INTRAMUSCULAR | Status: DC
  Administered 2020-11-27 – 2020-11-28 (×2): 5000 [IU] via SUBCUTANEOUS
  Filled 2020-11-27 (×2): qty 1

## 2020-11-27 MED ORDER — FOLIC ACID 1 MG PO TABS
1.0000 mg | ORAL_TABLET | Freq: Every day | ORAL | Status: DC
  Administered 2020-11-27 – 2020-12-04 (×7): 1 mg via ORAL
  Filled 2020-11-27 (×7): qty 1

## 2020-11-27 MED ORDER — PANTOPRAZOLE SODIUM 40 MG PO TBEC
40.0000 mg | DELAYED_RELEASE_TABLET | Freq: Every day | ORAL | Status: DC
  Administered 2020-11-27: 40 mg via ORAL
  Filled 2020-11-27 (×2): qty 1

## 2020-11-27 MED ORDER — SODIUM CHLORIDE 0.9 % IV SOLN
1.0000 g | Freq: Three times a day (TID) | INTRAVENOUS | Status: DC
  Administered 2020-11-27 – 2020-11-28 (×2): 1 g via INTRAVENOUS
  Filled 2020-11-27 (×3): qty 1

## 2020-11-27 MED ORDER — ADULT MULTIVITAMIN W/MINERALS CH
1.0000 | ORAL_TABLET | Freq: Every day | ORAL | Status: DC
  Administered 2020-11-27 – 2020-12-04 (×7): 1 via ORAL
  Filled 2020-11-27 (×7): qty 1

## 2020-11-27 MED ORDER — VANCOMYCIN HCL IN DEXTROSE 1-5 GM/200ML-% IV SOLN: 1000.0000 mg | INTRAVENOUS | Status: DC

## 2020-11-27 MED ORDER — ONDANSETRON HCL 4 MG/2ML IJ SOLN: 4.0000 mg | Freq: Four times a day (QID) | INTRAMUSCULAR | Status: DC | PRN

## 2020-11-27 MED ORDER — FERROUS SULFATE 325 (65 FE) MG PO TABS
325.0000 mg | ORAL_TABLET | Freq: Every day | ORAL | Status: DC
  Administered 2020-11-28 – 2020-12-04 (×7): 325 mg via ORAL
  Filled 2020-11-27 (×7): qty 1

## 2020-11-27 MED ORDER — METRONIDAZOLE 500 MG/100ML IV SOLN
500.0000 mg | Freq: Once | INTRAVENOUS | Status: AC
  Administered 2020-11-27: 500 mg via INTRAVENOUS
  Filled 2020-11-27: qty 100

## 2020-11-27 NOTE — Telephone Encounter (Signed)
Pt spouse came into office asking to speak to Dr [name] because her husband rolled off the bed into the floor. She left him at home by himself in the floor while she came up to the office to get help. I asked if patient was bleeding, hurt or alert. Spouse seemed confused and unsure. I called 911 to go check on the patient and told his wife to meet paramedics at the house. Spouse also stated she needs a letter so she can get him placed somewhere else because she is unable to take care of him.

## 2020-11-27 NOTE — ED Notes (Signed)
Patient placed on bear hugger high 43c- Patient is answering questions appropriately but appears very weak with movement.  Bed Bugs noted.  (numerous)  Patient was covered in stool.  Wounds noted to both hip areas.  Patient's lower extremities mottled

## 2020-11-27 NOTE — ED Notes (Signed)
Large loose stool- incont- skin care and complete linen change

## 2020-11-27 NOTE — ED Notes (Signed)
Julieanne Cotton RN was sent a secure message regarding admission bed assignment room 1234

## 2020-11-27 NOTE — ED Notes (Signed)
Portable chest x-ray

## 2020-11-27 NOTE — ED Notes (Signed)
Bear hugger in place and the patient's temp is slowly improving.  IV bolus infusing as ordered.  Foley catheter in place with temp probe.  Continuous cardiac monitor in place with continuous pulse ox reading.

## 2020-11-27 NOTE — Progress Notes (Signed)
Pharmacy Antibiotic Note  Henry Carter is a 80 y.o. male admitted on 11/27/2020 with sepsis.  Pharmacy has been consulted for aztreonam & vancomycin dosing. Hypothermic, WBC WNL, SCr 2.66, CrCl ~ 21 ml/min Unable to obtain more info @ PCN allergy.  No cephalosporin use per Epic review.   Plan: Flagyl 500 mg IV q12 Aztreonam 2 gm x 1 then 1 gm q8h  Vancomycin 1250 mg IV x 1 then 1000 mg IV q48 for est AUC 495, Css min 12 using SCr 2.55 & TBW & Vd 0.72 F/u renal function, WBC, temp, culture data Vancomycin levels as needed  Height: '5\' 11"'$  (180.3 cm) Weight: 65.8 kg (145 lb) IBW/kg (Calculated) : 75.3  Temp (24hrs), Avg:89.1 F (31.7 C), Min:85 F (29.4 C), Max:91.6 F (33.1 C)  Recent Labs  Lab 11/27/20 1328 11/27/20 1706  WBC 5.2  --   CREATININE 2.66*  --   LATICACIDVEN 3.9* >11.0*    Estimated Creatinine Clearance: 21 mL/min (A) (by C-G formula based on SCr of 2.66 mg/dL (H)).    Allergies  Allergen Reactions   Ace Inhibitors     REACTION: angioedema   Penicillins Hives    Has patient had a PCN reaction causing immediate rash, facial/tongue/throat swelling, SOB or lightheadedness with hypotension: No Has patient had a PCN reaction causing severe rash involving mucus membranes or skin necrosis: No Has patient had a PCN reaction that required hospitalization yes Has patient had a PCN reaction occurring within the last 10 years: no If all of the above answers are "NO", then may proceed with Cephalosporin use.    Antimicrobials this admission: 8/18 vanc>> 8/18 aztreonam>> 8/18 flagyl>> Dose adjustments this admission:  Microbiology results: 8/18 UCx: sent 8/18 BCx2: sent  Thank you for allowing pharmacy to be a part of this patient's care.  Eudelia Bunch, Pharm.D [phone] 11/27/2020 7:24 PM

## 2020-11-27 NOTE — H&P (Signed)
History and Physical    Henry Carter [id number] DOB: 1941-01-09 DOA: 11/27/2020  PCP: [name], MD  Patient coming from: Home  I have personally briefly reviewed patient's old medical records in [place]  Chief Complaint: Fall/found on the floor  HPI: Henry Carter is a 80 y.o. male with medical history significant of iron deficiency anemia, BPH, history of COPD, hypertension, GERD, peripheral vascular disease who presented to the ED via EMS after patient rolled off the bed onto the floor. Per review of epic it is noted that patient's wife presented to PCPs office stating that patient had rolled off the bed onto the floor, she needed help and reported she needed a letter to get patient placed that she could no longer take care of him. It is noted at PCPs office called EMS and patient taken to the ED for further evaluation. On initial presentation patient noted to have a profound hypothermia with a temperature of 85, generalized weakness, mottled lower extremities and noted to be covered in stool and bedbugs with some bilateral hip wounds. Patient at the time of my interview had received some fluid boluses, some improvement with blood pressure, and noted to be alert.  Patient denies any fevers, no chills, no nausea, no vomiting, no chest pain, no shortness of breath, no syncope, no constipation, no dysuria, no melena, no hematemesis, no hematochezia.  Patient also denies any diarrhea however noted to be found in feces and with bedbugs.  It is noted per ED RN that patient had 2 bouts of watery loose stools in the ED.  ED Course: Patient seen in the ED, COVID-19 PCR done was negative, influenza A and B negative.  Patient noted to have a temperature of 85, pulse of 58, blood pressure of 84/51, sats of 100% on 3 L nasal cannula.  Initial labs with a comprehensive metabolic profile with a sodium of 132, potassium of 5.2, bicarb of 14, glucose of 256, BUN of 86, creatinine  of 266, albumin of 2.6, AST of 64, protein of 5.4 otherwise was within normal limits.  Lactic acid was 3.9.  CBC with a hemoglobin of 8.6 otherwise is within normal limits.  INR at 1.1.  Urinalysis done was turbid, yellow, 5 ketones, moderate leukocytes, nitrite negative, many bacteria, WBC > 50.  Blood cultures and urine cultures obtained and pending.  Chest x-ray done negative for any acute infiltrate.  Plain films of the pelvis unremarkable.  Patient placed on a bear hugger.  Patient placed on IV fluids, given IV vancomycin, IV aztreonam, IV Flagyl x1.  PCCM consulted by ED and assessed the patient and recommended hospitalist admission.  Review of Systems: As per HPI otherwise all other systems reviewed and are negative.  Past Medical History:  Diagnosis Date    iron deficiency 01/18/2013   Angioedema    BPH (benign prostatic hyperplasia)    COPD (chronic obstructive pulmonary disease) ([place])    STates he has copd   Essential hypertension    GERD (gastroesophageal reflux disease)    Helicobacter pylori gastritis 10/10/2012   Hyperlipidemia    IBS (irritable bowel syndrome)    Impotence    Peripheral vascular disease, unspecified ([place])    Personal history of colonic adenoma 09/22/2012   Personal history of tobacco use, presenting hazards to health     Past Surgical History:  Procedure Laterality Date   abdominal surg for ulcers'  1980   COLONOSCOPY N/A 09/22/2012   Procedure: COLONOSCOPY;  Surgeon: Ofilia Neas  Carlean Purl, MD;  Location: [facility];  Service: Endoscopy;  Laterality: N/A;   distal aortogram     IVC FILTER INSERTION N/A 06/11/2019   Procedure: IVC FILTER INSERTION;  Surgeon: Waynetta Sandy, MD;  Location: [facility];  Service: Cardiovascular;  Laterality: N/A;   IVC VENOGRAPHY N/A 06/11/2019   Procedure: IVC Venography;  Surgeon: Waynetta Sandy, MD;  Location: [facility];  Service: Cardiovascular;  Laterality: N/A;   percutaneous transluminal  angionplasty with placement of 2 self expanding stent in the distal left superfical femeral artery      Social History  reports that he quit smoking about 6 years ago. His smoking use included cigarettes. He started smoking about 63 years ago. He has a 12.50 pack-year smoking history. He has never used smokeless tobacco. He reports current drug use. He reports that he does not drink alcohol.  Allergies  Allergen Reactions   Ace Inhibitors     REACTION: angioedema   Penicillins Hives    Has patient had a PCN reaction causing immediate rash, facial/tongue/throat swelling, SOB or lightheadedness with hypotension: No Has patient had a PCN reaction causing severe rash involving mucus membranes or skin necrosis: No Has patient had a PCN reaction that required hospitalization yes Has patient had a PCN reaction occurring within the last 10 years: no If all of the above answers are "NO", then may proceed with Cephalosporin use.    Family History  Problem Relation Age of Onset   Alcohol abuse Father    Kidney disease Brother    Drug abuse Brother    Diabetes Brother    Diabetes Mother    Diabetes Brother    Colon cancer Neg Hx    Patient states father deceased not sure of the cause or age.  Mother deceased patient not sure of the cause or age.  Prior to Admission medications   Medication Sig Start Date End Date Taking? Authorizing Provider  ciprofloxacin (CIPRO) 250 MG tablet Take 1 tablet (250 mg total) by mouth every 12 (twelve) hours. Patient not taking: Reported on 11/27/2020 08/17/19   Virgel Manifold, MD  ferrous sulfate 325 (65 FE) MG tablet Take 325 mg by mouth daily with breakfast.    [provider]  finasteride (PROSCAR) 5 MG tablet Take 5 mg by mouth daily.    [provider]  simvastatin (ZOCOR) 20 MG tablet Take 20 mg by mouth daily.    [provider]  traMADol (ULTRAM) 50 MG tablet TAKE 1 TABLET (50 MG TOTAL) BY MOUTH EVERY 6 (SIX) HOURS AS  NEEDED. Patient taking differently: Take 50 mg by mouth every 6 (six) hours as needed for moderate pain. 11/08/19   Biagio Borg, MD    Physical Exam: Vitals:   11/27/20 1615 11/27/20 1630 11/27/20 1645 11/27/20 1700  BP: 95/64 102/64 (!) 110/94 94/65  Pulse:      Resp: '17 15 19 14  '$ Temp: (!) 91.4 F (33 C) (!) 91.6 F (33.1 C) (!) 91.6 F (33.1 C) (!) 91.5 F (33.1 C)  TempSrc:      SpO2: 100%     Weight:      Height:        Constitutional: On Bair hugger.  Cachectic.  Temporal wasting.  Frail.  Emaciated. Vitals:   11/27/20 1615 11/27/20 1630 11/27/20 1645 11/27/20 1700  BP: 95/64 102/64 (!) 110/94 94/65  Pulse:      Resp: '17 15 19 14  '$ Temp: (!) 91.4  F (33 C) (!) 91.6 F (33.1 C) (!) 91.6 F (33.1 C) (!) 91.5 F (33.1 C)  TempSrc:      SpO2: 100%     Weight:      Height:       Eyes: PERRL, lids and conjunctivae normal ENMT: Mucous membranes are dry. Posterior pharynx clear of any exudate or lesions.poor dentition.  Neck: normal, supple, no masses, no thyromegaly Respiratory: clear to auscultation bilaterally, no wheezing, no crackles. Normal respiratory effort. No accessory muscle use.  Cardiovascular: Regular rate and rhythm, no murmurs / rubs / gallops.  2+ bilateral lower extremity edema R > L.  Petechiae on lower extremities.  Abdomen: Soft, scaphoid, nondistended, some diffuse tenderness to palpation, positive bowel sounds.  No rebound.  No guarding Musculoskeletal: no clubbing / cyanosis. No joint deformity upper and lower extremities. Good ROM, no contractures. Normal muscle tone.  Skin: Bilateral lower extremities with petechiae. Neurologic: CN 2-12 grossly intact. Sensation intact, DTR normal. Strength 5/5 in all 4.  Psychiatric: Poor to fair judgment and insight. Alert. Normal mood.   Labs on Admission: I have personally reviewed following labs and imaging studies  CBC: Recent Labs  Lab 11/27/20 1328  WBC 5.2  NEUTROABS PENDING  HGB 8.6*  HCT  28.2*  MCV 80.3  PLT [id number]    Basic Metabolic Panel: Recent Labs  Lab 11/27/20 1328  NA 132*  K 5.2*  CL 104  CO2 14*  GLUCOSE 256*  BUN 86*  CREATININE 2.66*  CALCIUM 8.2*    GFR: Estimated Creatinine Clearance: 21 mL/min (A) (by C-G formula based on SCr of 2.66 mg/dL (H)).  Liver Function Tests: Recent Labs  Lab 11/27/20 1328  AST 64*  ALT 32  ALKPHOS 92  BILITOT 1.1  PROT 5.4*  ALBUMIN 2.6*    Urine analysis:    Component Value Date/Time   COLORURINE YELLOW 11/27/2020 1348   APPEARANCEUR TURBID (A) 11/27/2020 1348   LABSPEC 1.011 11/27/2020 1348   PHURINE 6.0 11/27/2020 1348   GLUCOSEU NEGATIVE 11/27/2020 1348   HGBUR MODERATE (A) 11/27/2020 1348   BILIRUBINUR NEGATIVE 11/27/2020 1348   BILIRUBINUR negative 08/01/2017 1649   KETONESUR 5 (A) 11/27/2020 1348   PROTEINUR 100 (A) 11/27/2020 1348   UROBILINOGEN negative (A) 08/01/2017 1649   NITRITE NEGATIVE 11/27/2020 1348   LEUKOCYTESUR MODERATE (A) 11/27/2020 1348    Radiological Exams on Admission: DG Pelvis Portable  Result Date: 11/27/2020 CLINICAL DATA:  [place] EXAM: PORTABLE PELVIS 1-2 VIEWS COMPARISON:  CT 08/17/2019 FINDINGS: There is no evidence of pelvic fracture or diastasis. No pelvic bone lesions are seen. Extensive aortoiliac atheromatous calcifications. Retrievable IVC filter. Spondylitic changes in the visualized lower lumbar spine. IMPRESSION: No acute findings. Electronically Signed   By: Lucrezia Europe M.D.   On: 11/27/2020 15:22   DG Chest Port 1 View  Result Date: 11/27/2020 CLINICAL DATA:  [place] EXAM: PORTABLE CHEST 1 VIEW COMPARISON:  Chest radiograph 06/09/2019 FINDINGS: The cardiomediastinal silhouette is within normal limits. The lungs are clear, with no focal consolidation or pulmonary edema. There is no pleural effusion or pneumothorax. There is no acute osseous abnormality. IMPRESSION: No radiographic evidence of acute cardiopulmonary process. Electronically Signed   By: Valetta Mole M.D.    On: 11/27/2020 15:21    EKG: Not done  Assessment/Plan Principal Problem:   Severe sepsis with acute organ dysfunction [facility]) Active Problems:   Hypothermia   Hyperlipidemia   Essential hypertension   PERIPHERAL VASCULAR DISEASE   Benign prostatic  hyperplasia   Diarrhea   Iron deficiency anemia due to chronic blood loss   Acute renal failure (ARF) (HCC)   Hyperkalemia   Metabolic acidosis   Chronic indwelling Foley catheter   Acute lower UTI   Transaminitis   Severe protein-calorie malnutrition (HCC)   Bilateral edema of lower extremity   Hyperglycemia   Dehydration   #1 severe sepsis with organ dysfunction, POA -Patient presenting with signs of severe sepsis with organ dysfunction with severe hypothermia with a temperature of 85, noted to be hypotensive on admission with blood pressure of 84/51, lactic acid level elevated at 3.9, and acute renal failure with a creatinine of 2.66, urinalysis was cloudy and milky, with moderate leukocytes, nitrite negative, many bacteria, > 50 WBCs with concerns for UTI.  Patient also noted with profuse diarrhea. -Patient pancultured with blood cultures pending.  Urine cultures pending. -Chest x-ray with no acute infiltrate. -Repeat lactic acid levels, check a procalcitonin, TSH. -Checking C. difficile PCR, GI pathogen panel. -Patient received fluid bolus in the ED per sepsis protocol. -Change IV fluids to normal saline at 125 cc an hour. -Place on empiric IV vancomycin, IV aztreonam, IV Flagyl. -Continue Bair hugger. -PCCM consulted and assessed the patient in the ED. -Supportive care.  2.  Hypothermia -Likely secondary to problem #1. -Patient pancultured. -Check a TSH. -Place empirically on IV vancomycin, IV aztreonam, IV Flagyl. -Bear hugger.  3.  Hypotension Likely multifactorial secondary to hypovolemia from GI losses from profuse diarrhea, infectious etiology from probable UTI.  Blood pressure responding to IV fluids. -Patient  pancultured cultures pending. -Continue IV fluid resuscitation. -Continue IV antibiotics.  4.  Acute renal failure -Likely secondary to prerenal azotemia secondary to profuse ongoing diarrhea from GI losses.  Patient also noted to be hypotensive on admission and hypothermic. -Creatinine on admission at 2.66 with a BUN of 86. -Creatinine from 07/19/2019 as low as 1.29. -Check a fractional excretion of sodium, renal ultrasound. -Foley catheter placed in the ED. -Monitor urine output. -IV fluids. -Follow.  5.  Diarrhea -Patient with profuse ongoing diarrhea found in his feces on presentation. -Patient noted to be hypothermic, concern for severe sepsis. -Checking C. difficile PCR, GI pathogen panel. -Place Flexi-Seal. -IV fluids. -If C. difficile PCR is negative and GI pathogen panel is negative could place on Imodium as needed.  6.  Dehydration -Likely secondary to poor oral intake, GI losses. -IV fluids.  7.  GERD -PPI.  8.  Hyperkalemia -Likely secondary to acute renal failure. -Repeat labs and if still elevated or worsen consider Lokelma.  9.  Metabolic acidosis -Patient with elevated lactic acid level, presenting with profuse watery diarrhea with GI losses, noted to be hypotensive, concern for severe sepsis with urinalysis concerning for UTI. -Patient pancultured. -Check stool studies with C. difficile PCR, GI pathogen panel. -IV fluids. -Repeat labs if no improvement may consider placing on a bicarb drip. -Empiric IV antibiotics as stated in problem #1.  10.  Lower extremity edema R > L -Patient with lower extremity edema noted on examination with bilateral lower extremity petechiae noted. -Likely secondary to hypoalbuminemia. -Patient noted to have a prior history of lower extremity DVT. -Check lower extremity Dopplers. -IV albumin x1. -Follow.  11.  Transaminitis -Check an acute hepatitis panel. -Repeat labs in the morning and if no improvement may consider  abdominal ultrasound for further evaluation.  12.  Anemia -Check an anemia panel. -Patient with no overt bleeding noted. -Follow H&H. -Transfusion threshold hemoglobin < 7.  13.  BPH -Resume home regimen Proscar.  14.  Hyperglycemia -Check a hemoglobin A1c. -Follow.  15.  UTI -Check urine cultures -IV antibiotics  16.  Severe protein calorie malnutrition/failure to thrive -Patient noted to be found in bed bugs and in feces. -Consult with dietitian. -TOC consultation due to concern that APS may need to be involved.  17.  Mechanical fall -Plain films of the pelvis unremarkable. -PT/OT. -Will likely need placement.    DVT prophylaxis: Heparin Code Status:   Full per patient Family Communication:  Updated patient.  No family at bedside. Disposition Plan:   Patient is from:  Home  Anticipated DC to:  Probable SNF  Anticipated DC date:  5 to 6 days  Anticipated DC barriers: Placement  Consults called:  PCCM Admission status:  Admit to stepdown unit  Severity of Illness: The appropriate patient status for this patient is INPATIENT. Inpatient status is judged to be reasonable and necessary in order to provide the required intensity of service to ensure the patient's safety. The patient's presenting symptoms, physical exam findings, and initial radiographic and laboratory data in the context of their chronic comorbidities is felt to place them at high risk for further clinical deterioration. Furthermore, it is not anticipated that the patient will be medically stable for discharge from the hospital within 2 midnights of admission. The following factors support the patient status of inpatient.   " The patient's presenting symptoms include hypothermia, with a temperature of 85, generalized weakness, patient covered in stool and bedbugs on presentation, lactic acidosis. " The worrisome physical exam findings include cachexia, lower extremity edema, lower extremity petechiae,  hypotension.. " The initial radiographic and laboratory data are worrisome because of hypothermia, severe sepsis with organ dysfunction, diarrhea, pyuria, acute renal failure. " The chronic co-morbidities include BPH/hypertension/anemia/medical noncompliance.   * I certify that at the point of admission it is my clinical judgment that the patient will require inpatient hospital care spanning beyond 2 midnights from the point of admission due to high intensity of service, high risk for further deterioration and high frequency of surveillance required.*    Irine Seal MD [facility]  How to contact the [facility] Attending or Consulting provider [place] or covering provider during after hours [place], for this patient?   Check the care team in [facility] and look for a) attending/consulting [facility] provider listed and b) the [facility] team listed Log into [email or web address] and use [place]'s universal password to access. If you do not have the password, please contact the hospital operator. Locate the [facility] provider you are looking for under [facility] and page to a number that you can be directly reached. If you still have difficulty reaching the provider, please page the [facility] (Director on Call) for the Hospitalists listed on amion for assistance.  11/27/2020, 6:37 PM

## 2020-11-27 NOTE — Sepsis Progress Note (Signed)
Elink monitoring code sepsis 

## 2020-11-27 NOTE — ED Notes (Signed)
Dr Grandville Silos is at the bedside for admission assessment.  The patient is ready for admission.  We will be taking up double bagged linens that came from his home

## 2020-11-27 NOTE — ED Notes (Signed)
Incont. Of loose runny brown stools.  Cleaned and adult diaper applied.

## 2020-11-27 NOTE — Progress Notes (Signed)
A consult was received from an ED physician for Vancomycin and Aztreonam per pharmacy dosing - PCN allergy of hives (noted required hospitalization for allergic rx) - patient has no documented Cephalosporin administration - unable to change to Cefepime.  The patient's profile has been reviewed for ht/wt/allergies/indication/available labs.    A one time order has been placed for Vancomycin '1250mg'$ , Aztreonam 2gm & Flagyl '500mg'$  per MD order.  Further antibiotics/pharmacy consults should be ordered by admitting physician if indicated.                       Thank you,  Minda Ditto PharmD 11/27/2020  1:45 PM

## 2020-11-27 NOTE — Sepsis Progress Note (Signed)
Notified bedside nurse of need to draw repeat lactic acid. 

## 2020-11-27 NOTE — Sepsis Progress Note (Signed)
Rn called elink into room. Pt's repeat LA was >11. Asked to notify MD.  Paged MD and informed of critical value.  Also asked MD to order repeating LA.

## 2020-11-27 NOTE — Consult Note (Signed)
NAME:  Henry Carter, MRN:  [id number], DOB:  Dec 18, 1940, LOS: 0 ADMISSION DATE:  11/27/2020, CONSULTATION DATE:  11/27/20 REFERRING MD:  Dr. Roderic Palau, CHIEF COMPLAINT:  AMS   History of Present Illness:  80 y/o M who presented to [facility] on 8/18 via EMS after the patient rolled off the bed onto the floor.   Per EPIC review, the patient's wife went to the PCP office asking for help and reported she needed a letter to get him placed as she could no longer care for him. EMS was called by the PCP office and he was taken to the ER for evaluation. Initial ER evaluation notable for profound hypothermia (temp 85), weakness, bilateral hip wounds, patient mottled, covered in stool and bed bugs.  Of note, he had a planned E-visit on 8/5 with Dr. [name] but did not complete it.   Initial evaluation notable for profound hypothermia, lethargy.  Labs reflected mild hyponatremia, hyperglycemia, AKI, lactic acidosis (3.9), normal WBC and mild anemia (8.6).  CXR was negative as well as pelvis XRAY after fall.    PCCM consulted for evaluation.   Pertinent  Medical History  Anemia  BPH HTN HLD PVD GERD H.Pylori Gastritis - 2014   Significant Hospital Events: Including procedures, antibiotic start and stop dates in addition to other pertinent events   8/18 Admit to [facility], PCCM consult  Interim History / Subjective:  Temp up to 91 degrees, mental status improved  Diarrhea  Objective   Blood pressure 95/64, pulse 69, temperature (!) 91.4 F (33 C), resp. rate 17, height '5\' 11"'$  (1.803 m), weight 65.8 kg, SpO2 100 %.       No intake or output data in the 24 hours ending 11/27/20 1702 Filed Weights   11/27/20 1244  Weight: 65.8 kg    Examination: General: Thin, cachectic, no acute distress Eyes: EOMI, icterus Neck: Supple, thin Cardiovascular: Regular rate and rhythm, no lower extremity edema noted Pulmonary: Normal work of breathing, on 2 L nasal cannula Abdomen: Cachectic, nondistended, no  wounds MSK: No synovitis, joint effusion Neuro: Moves all extremities freely, sensation appears intact Psych: A bit inattentive, somewhat confused, but alert  Resolved Hospital Problem list     Assessment & Plan:   Hypotension: In the setting of likely hypovolemia due to poor p.o. intake, diffuse diarrhea.  Possible component of sepsis.  Lactate elevated. --Status post IV fluid resuscitation 30 cc/kg, would recommend additional fluid boluses as needed for hypotension as long as respiratory status is stable and of hypovolemia --Continue empiric antibiotics, UA appears a bit dirty although significant amount of squamous cells so could be contaminated --Chest x-ray appears clear --Follow-up cultures  Hypothermia: In the setting of cachexia, failure to thrive.  Possible sepsis given low core temperature. -Continue to rewarm externally  Mechanical Fall  Pelvis, CXR negative  -PT/OT when able  Anemia : Presumed due to chronic inflammation, chronic disease -trend CBC -transfuse for Hgb <7% -Check iron levels  Adult Failure to Thrive Previously a patient of [place] from April - September 2021. Staff reports notes reflect that Daughter didn't want him to return home at the time. Family refused visits from staff for bath, nursing visits.  -- Consider Adult Protective Services consult  Recommend mission to [facility].  Disposition to be determined by them.  PCCM will sign off.  Please contact us with any questions or concerns.  Best Practice (right click and "Reselect all SmartList Selections" daily)   Per primary  Labs   CBC:  Recent Labs  Lab 11/27/20 1328  WBC 5.2  NEUTROABS PENDING  HGB 8.6*  HCT 28.2*  MCV 80.3  PLT [id number]    Basic Metabolic Panel: Recent Labs  Lab 11/27/20 1328  NA 132*  K 5.2*  CL 104  CO2 14*  GLUCOSE 256*  BUN 86*  CREATININE 2.66*  CALCIUM 8.2*   GFR: Estimated Creatinine Clearance: 21 mL/min (A) (by C-G formula based on SCr of 2.66 mg/dL  (H)). Recent Labs  Lab 11/27/20 1328  WBC 5.2  LATICACIDVEN 3.9*    Liver Function Tests: Recent Labs  Lab 11/27/20 1328  AST 64*  ALT 32  ALKPHOS 92  BILITOT 1.1  PROT 5.4*  ALBUMIN 2.6*   No results for input(s): LIPASE, AMYLASE in the last 168 hours. No results for input(s): AMMONIA in the last 168 hours.  ABG No results found for: PHART, PCO2ART, PO2ART, HCO3, TCO2, ACIDBASEDEF, O2SAT   Coagulation Profile: Recent Labs  Lab 11/27/20 1328  INR 1.1    Cardiac Enzymes: No results for input(s): CKTOTAL, CKMB, CKMBINDEX, TROPONINI in the last 168 hours.  HbA1C: Hgb A1c MFr Bld  Date/Time Value Ref Range Status  07/11/2018 01:31 PM 6.1 4.6 - 6.5 % Final    Comment:    Glycemic Control Guidelines for People with Diabetes:Non Diabetic:  <6%Goal of Therapy: <7%Additional Action Suggested:  >8%   07/19/2017 10:59 AM 6.0 4.6 - 6.5 % Final    Comment:    Glycemic Control Guidelines for People with Diabetes:Non Diabetic:  <6%Goal of Therapy: <7%Additional Action Suggested:  >8%     CBG: Recent Labs  Lab 11/27/20 1305 11/27/20 1427  GLUCAP 54* 139*    Review of Systems:   Unobtainable due to patient factors  Past Medical History:  He,  has a past medical history of  iron deficiency (01/18/2013), Angioedema, BPH (benign prostatic hyperplasia), COPD (chronic obstructive pulmonary disease) ([place]), Essential hypertension, GERD (gastroesophageal reflux disease), Helicobacter pylori gastritis (10/10/2012), Hyperlipidemia, IBS (irritable bowel syndrome), Impotence, Peripheral vascular disease, unspecified ([place]), Personal history of colonic adenoma (09/22/2012), and Personal history of tobacco use, presenting hazards to health.   Surgical History:   Past Surgical History:  Procedure Laterality Date   abdominal surg for ulcers'  1980   COLONOSCOPY N/A 09/22/2012   Procedure: COLONOSCOPY;  Surgeon: Gatha Mayer, MD;  Location: [facility] ENDOSCOPY;  Service: Endoscopy;   Laterality: N/A;   distal aortogram     IVC FILTER INSERTION N/A 06/11/2019   Procedure: IVC FILTER INSERTION;  Surgeon: Waynetta Sandy, MD;  Location: [place] CV LAB;  Service: Cardiovascular;  Laterality: N/A;   IVC VENOGRAPHY N/A 06/11/2019   Procedure: IVC Venography;  Surgeon: Waynetta Sandy, MD;  Location: [place] CV LAB;  Service: Cardiovascular;  Laterality: N/A;   percutaneous transluminal angionplasty with placement of 2 self expanding stent in the distal left superfical femeral artery       Social History:   reports that he quit smoking about 6 years ago. His smoking use included cigarettes. He started smoking about 63 years ago. He has a 12.50 pack-year smoking history. He has never used smokeless tobacco. He reports current drug use. He reports that he does not drink alcohol.   Family History:  His family history includes Alcohol abuse in his father; Diabetes in his brother, brother, and mother; Drug abuse in his brother; Kidney disease in his brother. There is no history of Colon cancer.   Allergies Allergies  Allergen Reactions  Ace Inhibitors     REACTION: angioedema   Penicillins Hives    Has patient had a PCN reaction causing immediate rash, facial/tongue/throat swelling, SOB or lightheadedness with hypotension: No Has patient had a PCN reaction causing severe rash involving mucus membranes or skin necrosis: No Has patient had a PCN reaction that required hospitalization yes Has patient had a PCN reaction occurring within the last 10 years: no If all of the above answers are "NO", then may proceed with Cephalosporin use.     Home Medications  Prior to Admission medications   Medication Sig Start Date End Date Taking? Authorizing Provider  ciprofloxacin (CIPRO) 250 MG tablet Take 1 tablet (250 mg total) by mouth every 12 (twelve) hours. 08/17/19   Virgel Manifold, MD  ferrous sulfate 325 (65 FE) MG tablet Take 325 mg by mouth daily with  breakfast.    [provider]  finasteride (PROSCAR) 5 MG tablet Take 5 mg by mouth daily.    [provider]  simvastatin (ZOCOR) 20 MG tablet Take 20 mg by mouth daily.    [provider]  traMADol (ULTRAM) 50 MG tablet TAKE 1 TABLET (50 MG TOTAL) BY MOUTH EVERY 6 (SIX) HOURS AS NEEDED. 11/08/19   Biagio Borg, MD     Critical care time: n/a    Lanier Clam, MD See Shea Evans on for contact info

## 2020-11-27 NOTE — ED Notes (Signed)
Patient was clean and clothing removed.  Patient appears very emaciated.  Patient will answer questions when asked, answers appropriately when asked.  Live bed bugs noted on the patient.  Patient's bedspread was placed in red bags and sealed.

## 2020-11-27 NOTE — ED Notes (Signed)
Dr Roderic Palau was notified via [facility], RN in regards to the low blood glucose reading and unable to obtain pulse ox.

## 2020-11-27 NOTE — ED Triage Notes (Signed)
Patient was transferred from his home via EMS- wife reported that the patient fell this morning but with no injuries.  Poor hygiene and appears very unkept.  EMS reports ? Failure to thrive.

## 2020-11-28 ENCOUNTER — Inpatient Hospital Stay (HOSPITAL_COMMUNITY): Payer: Medicare HMO

## 2020-11-28 DIAGNOSIS — T68XXXD Hypothermia, subsequent encounter: Secondary | ICD-10-CM

## 2020-11-28 DIAGNOSIS — N390 Urinary tract infection, site not specified: Secondary | ICD-10-CM | POA: Diagnosis not present

## 2020-11-28 DIAGNOSIS — R609 Edema, unspecified: Secondary | ICD-10-CM | POA: Diagnosis not present

## 2020-11-28 DIAGNOSIS — A419 Sepsis, unspecified organism: Secondary | ICD-10-CM | POA: Diagnosis not present

## 2020-11-28 DIAGNOSIS — N179 Acute kidney failure, unspecified: Secondary | ICD-10-CM | POA: Diagnosis not present

## 2020-11-28 DIAGNOSIS — I739 Peripheral vascular disease, unspecified: Secondary | ICD-10-CM

## 2020-11-28 DIAGNOSIS — N1330 Unspecified hydronephrosis: Secondary | ICD-10-CM | POA: Diagnosis present

## 2020-11-28 DIAGNOSIS — I82409 Acute embolism and thrombosis of unspecified deep veins of unspecified lower extremity: Secondary | ICD-10-CM | POA: Diagnosis not present

## 2020-11-28 DIAGNOSIS — N401 Enlarged prostate with lower urinary tract symptoms: Secondary | ICD-10-CM | POA: Diagnosis not present

## 2020-11-28 LAB — BLOOD CULTURE ID PANEL (REFLEXED) - BCID2

## 2020-11-28 LAB — LACTIC ACID, PLASMA
Lactic Acid, Venous: 1 mmol/L (ref 0.5–1.9)
Lactic Acid, Venous: 1 mmol/L (ref 0.5–1.9)

## 2020-11-28 LAB — GASTROINTESTINAL PANEL BY PCR, STOOL (REPLACES STOOL CULTURE)

## 2020-11-28 LAB — MAGNESIUM: Magnesium: 2.2 mg/dL (ref 1.7–2.4)

## 2020-11-28 LAB — CBC WITH DIFFERENTIAL/PLATELET
Abs Immature Granulocytes: 0.01 10*3/uL (ref 0.00–0.07)
Basophils Absolute: 0 10*3/uL (ref 0.0–0.1)
Basophils Relative: 0 %
Eosinophils Absolute: 0 10*3/uL (ref 0.0–0.5)
Eosinophils Relative: 0 %
HCT: 20.5 % — ABNORMAL LOW (ref 39.0–52.0)
Hemoglobin: 6.6 g/dL — CL (ref 13.0–17.0)
Immature Granulocytes: 0 %
Lymphocytes Relative: 2 %
Lymphs Abs: 0.1 10*3/uL — ABNORMAL LOW (ref 0.7–4.0)
MCH: 24.5 pg — ABNORMAL LOW (ref 26.0–34.0)
MCHC: 32.2 g/dL (ref 30.0–36.0)
MCV: 76.2 fL — ABNORMAL LOW (ref 80.0–100.0)
Monocytes Absolute: 0.1 10*3/uL (ref 0.1–1.0)
Monocytes Relative: 5 %
Neutro Abs: 2.5 10*3/uL (ref 1.7–7.7)
Neutrophils Relative %: 93 %
Platelets: 124 10*3/uL — ABNORMAL LOW (ref 150–400)
RBC: 2.69 MIL/uL — ABNORMAL LOW (ref 4.22–5.81)
RDW: 23 % — ABNORMAL HIGH (ref 11.5–15.5)
WBC: 2.7 10*3/uL — ABNORMAL LOW (ref 4.0–10.5)
nRBC: 6.4 % — ABNORMAL HIGH (ref 0.0–0.2)

## 2020-11-28 LAB — HEPATITIS PANEL, ACUTE
HCV Ab: NONREACTIVE
Hep A IgM: NONREACTIVE
Hep B C IgM: NONREACTIVE
Hepatitis B Surface Ag: NONREACTIVE

## 2020-11-28 LAB — COMPREHENSIVE METABOLIC PANEL
ALT: 34 U/L (ref 0–44)
AST: 99 U/L — ABNORMAL HIGH (ref 15–41)
Albumin: 3 g/dL — ABNORMAL LOW (ref 3.5–5.0)
Alkaline Phosphatase: 75 U/L (ref 38–126)
Anion gap: 10 (ref 5–15)
BUN: 80 mg/dL — ABNORMAL HIGH (ref 8–23)
CO2: 16 mmol/L — ABNORMAL LOW (ref 22–32)
Calcium: 8 mg/dL — ABNORMAL LOW (ref 8.9–10.3)
Chloride: 111 mmol/L (ref 98–111)
Creatinine, Ser: 2.55 mg/dL — ABNORMAL HIGH (ref 0.61–1.24)
GFR, Estimated: 25 mL/min — ABNORMAL LOW (ref >60)
Glucose, Bld: 81 mg/dL (ref 70–99)
Potassium: 4.2 mmol/L (ref 3.5–5.1)
Sodium: 137 mmol/L (ref 135–145)
Total Bilirubin: 0.8 mg/dL (ref 0.3–1.2)
Total Protein: 5.4 g/dL — ABNORMAL LOW (ref 6.5–8.1)

## 2020-11-28 LAB — HEMOGLOBIN A1C
Hgb A1c MFr Bld: 6 % — ABNORMAL HIGH (ref 4.8–5.6)
Mean Plasma Glucose: 125.5 mg/dL

## 2020-11-28 LAB — PREPARE RBC (CROSSMATCH)

## 2020-11-28 LAB — HEMOGLOBIN AND HEMATOCRIT, BLOOD
HCT: 28.7 % — ABNORMAL LOW (ref 39.0–52.0)
Hemoglobin: 9.6 g/dL — ABNORMAL LOW (ref 13.0–17.0)

## 2020-11-28 LAB — PHOSPHORUS: Phosphorus: 5.7 mg/dL — ABNORMAL HIGH (ref 2.5–4.6)

## 2020-11-28 LAB — PSA: Prostatic Specific Antigen: 11 ng/mL — ABNORMAL HIGH (ref 0.00–4.00)

## 2020-11-28 MED ORDER — CHLORHEXIDINE GLUCONATE CLOTH 2 % EX PADS
6.0000 | MEDICATED_PAD | Freq: Every day | CUTANEOUS | Status: DC
  Administered 2020-11-28 – 2020-12-04 (×8): 6 via TOPICAL

## 2020-11-28 MED ORDER — BOOST / RESOURCE BREEZE PO LIQD CUSTOM
1.0000 | Freq: Three times a day (TID) | ORAL | Status: DC
  Administered 2020-11-28 – 2020-12-04 (×17): 1 via ORAL

## 2020-11-28 MED ORDER — ACETAMINOPHEN 325 MG PO TABS
650.0000 mg | ORAL_TABLET | Freq: Once | ORAL | Status: AC
  Filled 2020-11-28: qty 2

## 2020-11-28 MED ORDER — COLLAGENASE 250 UNIT/GM EX OINT
TOPICAL_OINTMENT | Freq: Every day | CUTANEOUS | Status: DC
  Administered 2020-11-28 – 2020-11-30 (×2): 1 via TOPICAL
  Filled 2020-11-28: qty 30

## 2020-11-28 MED ORDER — SODIUM CHLORIDE 0.9 % IV SOLN
2.0000 g | INTRAVENOUS | Status: DC
  Administered 2020-11-28 – 2020-11-29 (×2): 2 g via INTRAVENOUS
  Filled 2020-11-28 (×3): qty 20

## 2020-11-28 MED ORDER — STERILE WATER FOR INJECTION IV SOLN
INTRAVENOUS | Status: DC
  Filled 2020-11-28: qty 1000
  Filled 2020-11-28 (×4): qty 150

## 2020-11-28 MED ORDER — DIPHENHYDRAMINE HCL 25 MG PO CAPS
25.0000 mg | ORAL_CAPSULE | Freq: Once | ORAL | Status: AC
  Administered 2020-11-28: 25 mg via ORAL
  Filled 2020-11-28: qty 1

## 2020-11-28 MED ORDER — SODIUM CHLORIDE 0.9% IV SOLUTION: Freq: Once | INTRAVENOUS | Status: AC

## 2020-11-28 MED ORDER — SODIUM CHLORIDE 0.9 % IV SOLN: INTRAVENOUS | Status: DC | PRN

## 2020-11-28 MED ORDER — OXYCODONE HCL 5 MG PO TABS
5.0000 mg | ORAL_TABLET | Freq: Once | ORAL | Status: AC
  Administered 2020-11-28: 5 mg via ORAL
  Filled 2020-11-28: qty 1

## 2020-11-28 MED ORDER — LOPERAMIDE HCL 2 MG PO CAPS
2.0000 mg | ORAL_CAPSULE | Freq: Four times a day (QID) | ORAL | Status: DC
  Administered 2020-11-28 – 2020-11-29 (×4): 2 mg via ORAL
  Filled 2020-11-28 (×4): qty 1

## 2020-11-28 MED ORDER — FUROSEMIDE 10 MG/ML IJ SOLN
20.0000 mg | Freq: Once | INTRAMUSCULAR | Status: AC
  Administered 2020-11-28: 20 mg via INTRAVENOUS
  Filled 2020-11-28: qty 2

## 2020-11-28 MED ORDER — PANTOPRAZOLE SODIUM 40 MG IV SOLR
40.0000 mg | Freq: Two times a day (BID) | INTRAVENOUS | Status: DC
  Administered 2020-11-28 – 2020-12-01 (×8): 40 mg via INTRAVENOUS
  Filled 2020-11-28 (×8): qty 40

## 2020-11-28 MED ORDER — LOPERAMIDE HCL 2 MG PO CAPS
4.0000 mg | ORAL_CAPSULE | Freq: Once | ORAL | Status: AC
  Administered 2020-11-28: 4 mg via ORAL
  Filled 2020-11-28: qty 2

## 2020-11-28 MED ORDER — ENSURE SURGERY PO LIQD
237.0000 mL | ORAL | Status: DC
  Administered 2020-11-29 – 2020-12-04 (×5): 237 mL via ORAL
  Filled 2020-11-28 (×6): qty 237

## 2020-11-28 MED ORDER — JUVEN PO PACK
1.0000 | PACK | Freq: Two times a day (BID) | ORAL | Status: DC
  Administered 2020-11-28 – 2020-12-04 (×10): 1 via ORAL
  Filled 2020-11-28 (×10): qty 1

## 2020-11-28 NOTE — Evaluation (Signed)
SLP Cancellation Note  Patient Details Name: Lanell Bonnin MRN: [id number] DOB: January 18, 1941   Cancelled treatment:       Reason Eval/Treat Not Completed: Other (comment);Fatigue/lethargy limiting ability to participate;Patient at procedure or test/unavailable (pt lethargic and RN/NT getting ready to bathe him, will continue efforts)   Macario Golds 11/28/2020, 11:34 AM

## 2020-11-28 NOTE — TOC Initial Note (Signed)
Transition of Care [facility]) - Initial/Assessment Note    Patient Details  Name: Generoso Battenfield MRN: [id number] Date of Birth: 07-Feb-1941  Transition of Care [facility]) CM/SW Contact:    Leeroy Cha, RN Phone Number: 11/28/2020, 8:03 AM  Clinical Narrative:                  80 y.o. male with medical history significant of iron deficiency anemia, BPH, history of COPD, hypertension, GERD, peripheral vascular disease who presented to the ED via EMS after patient rolled off the bed onto the floor. Per review of epic it is noted that patient's wife presented to PCPs office stating that patient had rolled off the bed onto the floor, she needed help and reported she needed a letter to get patient placed that she could no longer take care of him. It is noted at PCPs office called EMS and patient taken to the ED for further evaluation. On initial presentation patient noted to have a profound hypothermia with a temperature of 85, generalized weakness, mottled lower extremities and noted to be covered in stool and bedbugs with some bilateral hip wounds. Patient at the time of my interview had received some fluid boluses, some improvement with blood pressure, and noted to be alert.  Patient denies any fevers, no chills, no nausea, no vomiting, no chest pain, no shortness of breath, no syncope, no constipation, no dysuria, no melena, no hematemesis, no hematochezia.  Patient also denies any diarrhea however noted to be found in feces and with bedbugs.  It is noted per ED RN that patient had 2 bouts of watery loose stools in the ED.   ED Course: Patient seen in the ED, COVID-19 PCR done was negative, influenza A and B negative.  Patient noted to have a temperature of 85, pulse of 58, blood pressure of 84/51, sats of 100% on 3 L nasal cannula.  Initial labs with a comprehensive metabolic profile with a sodium of 132, potassium of 5.2, bicarb of 14, glucose of 256, BUN of 86, creatinine of 266, albumin of 2.6, AST  of 64, protein of 5.4 otherwise was within normal limits.  Lactic acid was 3.9.  CBC with a hemoglobin of 8.6 otherwise is within normal limits.  INR at 1.1.  Urinalysis done was turbid, yellow, 5 ketones, moderate leukocytes, nitrite negative, many bacteria, WBC > 50.  Blood cultures and urine cultures obtained and pending.  Chest x-ray done negative for any acute infiltrate.  Plain films of the pelvis unremarkable.  Patient placed on a bear hugger.  Patient placed on IV fluids, given IV vancomycin, IV aztreonam, IV Flagyl x1.  PCCM consulted by ED and assessed the patient and recommended hospitalist admission. TOC PLAN OF CARE: Pt is from home may need placement will follow for P.T. recommendations. Progression:iv abx x3, iv [id number],  Expected Discharge Plan: Home/Self Care Barriers to Discharge: Continued Medical Work up   Patient Goals and CMS Choice        Expected Discharge Plan and Services Expected Discharge Plan: Home/Self Care   Discharge Planning Services: CM Consult   Living arrangements for the past 2 months: Single Family Home                                      Prior Living Arrangements/Services Living arrangements for the past 2 months: Single Family Home Lives with:: Spouse Patient language and need for  interpreter reviewed:: Yes Do you feel safe going back to the place where you live?: Yes            Criminal Activity/Legal Involvement Pertinent to Current Situation/Hospitalization: No - Comment as needed  Activities of Daily Living Home Assistive Devices/Equipment: Wheelchair (wife states pt needs a hospital bed) ADL Screening (condition at time of admission) Patient's cognitive ability adequate to safely complete daily activities?: No Is the patient deaf or have difficulty hearing?: No Does the patient have difficulty seeing, even when wearing glasses/contacts?: No Does the patient have difficulty concentrating, remembering, or making decisions?:  Yes Patient able to express need for assistance with ADLs?: Yes Does the patient have difficulty dressing or bathing?: Yes Independently performs ADLs?: No Communication: Independent Dressing (OT): Independent (wife states he won't allow her to give him a bath or dress him) Grooming: Independent Feeding: Independent Bathing: Independent Toileting: Needs assistance Is this a change from baseline?: Pre-admission baseline In/Out Bed: Needs assistance Is this a change from baseline?: Pre-admission baseline Walks in Home: Dependent Is this a change from baseline?: Pre-admission baseline Does the patient have difficulty walking or climbing stairs?: Yes Weakness of Legs: Both Weakness of Arms/Hands: Both  Permission Sought/Granted                  Emotional Assessment       Orientation: : Oriented to Self, Oriented to Place, Oriented to  Time, Oriented to Situation Alcohol / Substance Use: Alcohol Use Psych Involvement: No (comment)  Admission diagnosis:  Sepsis ([place]) [A41.9] Patient Active Problem List   Diagnosis Date Noted   Severe sepsis with acute organ dysfunction ([place]) 11/27/2020   Acute lower UTI 11/27/2020   Transaminitis 11/27/2020   Severe protein-calorie malnutrition ([place]) 11/27/2020   Bilateral edema of lower extremity 11/27/2020   Hyperglycemia 11/27/2020   Dehydration 11/27/2020   Hypothermia 11/27/2020   Bladder outlet obstruction 08/17/2019   Abdominal pain 08/17/2019   Chronic indwelling Foley catheter 06/29/2019   History of DVT (deep vein thrombosis) 06/29/2019   Obstruction of Foley catheter ([place]) 06/29/2019   Acute bilateral obstructive uropathy    Hypokalemia    Acute metabolic encephalopathy    Hyponatremia    Hematuria 06/09/2019   Acute renal failure (ARF) ([place]) 06/09/2019   Acute blood loss anemia 06/09/2019   Hyperkalemia 99991111   Metabolic acidosis 99991111   Left leg DVT ([place]) 05/18/2019   Left leg swelling 05/11/2019    Pre-operative cardiovascular examination 03/22/2019   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[place]) 07/11/2018   Right arm weakness 07/21/2017   Non compliance w medication regimen 07/30/2016   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   Mild cognitive impairment 12/11/2015   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   Healthcare maintenance 02/07/2013   Iron deficiency anemia due to chronic blood loss 01/18/2013   Diarrhea 09/22/2012   C O P D 04/08/2010   Atherosclerosis of native artery of extremity with intermittent claudication ([place]) 11/26/2008   Benign prostatic hyperplasia 10/20/2007   PERIPHERAL VASCULAR DISEASE 05/19/2007   Essential hypertension 02/01/2007   Hyperlipidemia 01/03/2007   PCP:  [name], MD Pharmacy:   [id number] [place] [id number]: [phone]: [phone]    Social Determinants of Health (SDOH) Interventions    Readmission Risk Interventions No flowsheet data found.

## 2020-11-28 NOTE — Evaluation (Signed)
Physical Therapy Evaluation Patient Details Name: Henry Carter MRN: [id number] DOB: 08/17/1940 Today's Date: 11/28/2020   History of Present Illness  Patient 80 year old gentleman history of iron deficiency anemia, BPH, chronic bladder outlet obstruction, COPD, hypertension, GERD, PVD presented to the ED via EMS after he was noted to rolled off the bed onto the floor. Patient admitted with severe sepsis and multiple wounds  Clinical Impression  Patient  lethargic, does not participate. Wife present and reports patient bedbound for 7 months., minimally eats or drinks. She is his caregiver.  Currently no skilled PT needs indicated as patient  is total care and bed bound. PT will sign off.    Follow Up Recommendations No PT follow up    Equipment Recommendations  Hospital bed if returns home.   Recommendations for Other Services       Precautions / Restrictions Precautions Precautions: Fall Precaution Comments: wounds Restrictions Weight Bearing Restrictions: No      Mobility  Bed Mobility Overal bed mobility: Needs Assistance Bed Mobility: Supine to Sit;Sidelying to Sit;Sit to Supine Rolling: +2 for physical assistance;Total assist Sidelying to sit: +2 for safety/equipment;+2 for physical assistance;Total assist   Sit to supine: +2 for safety/equipment;+2 for physical assistance;Total assist   General bed mobility comments: patient reaches for rail once rolling., total assistance to sit upright on bed edge    Transfers                 General transfer comment: NT  Ambulation/Gait                Stairs            Wheelchair Mobility    Modified Rankin (Stroke Patients Only)       Balance Overall balance assessment: Needs assistance Sitting-balance support: No upper extremity supported;Feet unsupported Sitting balance-Leahy Scale: Zero Sitting balance - Comments: requires support throughout Postural control: Posterior lean                                    Pertinent Vitals/Pain Pain Assessment: Faces Faces Pain Scale: No hurt    Home Living Family/patient expects to be discharged to:: Unsure Living Arrangements: Spouse/significant other               Additional Comments: Patient lives at home with wife, was bedbound    Prior Function Level of Independence: Needs assistance   Gait / Transfers Assistance Needed: Has not ambulate in approximately 7 months per spouse. bed bound.  ADL's / Homemaking Assistance Needed: Is total care - wife feeds him. Reports he doesn't eat solid food.        Hand Dominance   Dominant Hand: Right    Extremity/Trunk Assessment   Upper Extremity Assessment Upper Extremity Assessment: Defer to OT evaluation RUE Deficits / Details: grossly functional ROM, 3/5 strength LUE Deficits / Details: grossly functional ROM, 3/5 strength    Lower Extremity Assessment Lower Extremity Assessment: LLE deficits/detail;RLE deficits/detail RLE Deficits / Details: does not move the  leg as readily as left, noted edema throughout. LLE Deficits / Details: flesed leg with assistance and cues.    Cervical / Trunk Assessment Cervical / Trunk Assessment: Normal  Communication      Cognition Arousal/Alertness: Lethargic Behavior During Therapy: Flat affect Overall Cognitive Status: Difficult to assess  General Comments: arouses when spoken to, does not answer questions about drink of choice      General Comments      Exercises     Assessment/Plan    PT Assessment Patent does not need any further PT services  PT Problem List         PT Treatment Interventions      PT Goals (Current goals can be found in the Care Plan section)  Acute Rehab PT Goals PT Goal Formulation: All assessment and education complete, DC therapy    Frequency     Barriers to discharge        Co-evaluation   Reason for Co-Treatment: To  address functional/ADL transfers;For patient/therapist safety;Other (comment) (co-eval)           AM-PAC PT "6 Clicks" Mobility  Outcome Measure Help needed turning from your back to your side while in a flat bed without using bedrails?: Total Help needed moving from lying on your back to sitting on the side of a flat bed without using bedrails?: Total Help needed moving to and from a bed to a chair (including a wheelchair)?: Total Help needed standing up from a chair using your arms (e.g., wheelchair or bedside chair)?: Total Help needed to walk in hospital room?: Total Help needed climbing 3-5 steps with a railing? : Total 6 Click Score: 6    End of Session   Activity Tolerance: Patient limited by lethargy Patient left: in bed;with call bell/phone within reach;with family/visitor present Nurse Communication: Mobility status PT Visit Diagnosis: Adult, failure to thrive (R62.7)    Time: 1431-1450 PT Time Calculation (min) (ACUTE ONLY): 19 min   Charges:   PT Evaluation $PT Eval Low Complexity: [place] Pager [phone] Office [phone]   Claretha Cooper 11/28/2020, 4:01 PM

## 2020-11-28 NOTE — Consult Note (Addendum)
Referring Provider: Dr. Irine Seal  Primary Care Physician:  [name], MD Primary Gastroenterologist:  Jeanmarie Hubert   Reason for Consultation: Anemia, diarrhea  HPI: Henry Carter is a 80 y.o. male with a past medical history significant for hypertension, hyperlipidemia, CVA, peripheral vascular disease s/p stent placement right femoral artery, 4 cm infrarenal abdominal aortic aneurysm,  DVT 04/2019 s/p IVC filter placement 06/2019, COPD, BPH with chronic indwelling foley catheter, IDA, gastric AVM, H. pylori gastritis, chronic diarrhea and one tubular adenomatous colon polyp per colonoscopy in 2014.   He rolled out of bed onto the floor yesterday while at home and his wife called 75 and he was transferred to [facility] ED for further evaluation.  Per EMS, he was soiled in stool and covered in bedbugs.  In the ED, he was hypothermic with a temperature of 85 degrees F.  A bear hugger was placed.  Code sepsis was initiated and he was started on IV fluids, Vanco and Flagyl IV.  Labs in the ED showed a sodium level 132.  Potassium 5.2.  Glucose 256.  BUN 86.  Creatinine 2.66.  Anion gap 14.  Alk phos 92.  AST 64.  ALT 32.  Albumin 2.6.  Total bili 1.4.  Lactic acid 3.9.  WBC 5.2.  Hemoglobin 8.6 (base line Hg 8.8 on 08/17/2019).  Hematocrit 28.2.  Platelet 161.  INR 1.1.  SARS coronavirus 2 negative.  Influenza A and B negative.  Acute hepatitis panel negative.  Moderate urine leukocytes.  Urine protein 100.  C. difficile negative.  GI pathogen panel pending.  Chest x-ray was negative.  Whe was admitted to the ICU.  A GI consult was requested due to persistent diarrhea and his hemoglobin level dropped from 8.6-6.6 overnight without any overt GI bleeding. Two units of PRBCs ordered, the 2nd transfusion nearly completed.   He is awake and conversant. He is unable to provide any details regarding his past medical history therefore the above history was obtained from his past  medical records in epic.  He does not recall falling at home but he knows he is in the hospital.  He denies having any chest pain or shortness of breath.  He denies having any dysphagia, GERD or abdominal pain.  He denies passing any bright red blood per the rectum or black stools when at home.  He has frequent diarrhea. He is unable to provide any details regarding eating habits at home but stated he thought he was eating some food daily.  He is no longer on Plavix.  He denies taking aspirin or any other NSAIDs.  Alcohol use.  I attempted to call his wife Rivers Hamrick to obtain further symptom history but she did not answer the telephone.  No family at the bedside.  His ICU RN reported he vomited approximately 100 to 150 cc of brown-colored emesis with saliva yesterday which did not have a coffee-ground appearance.  No frank hematemesis.  No further vomiting since then.  He is passing loose brown watery stool, rectal tube was placed.  No melenic appearing stool or obvious bright red blood.  FOBT ordered.  He was last seen in the GI clinic by Dr. Carlean Purl in 02/03/2018 due to having a prior history of a tubular adenomatous colon polyp per colonoscopy in 2014.  At that time, a colonoscopy for colon polyp surveillance was scheduled but he did not feel well enough to plate the bowel prep so he canceled this procedure.  History of IDA thought to be due to chronic GI blood loss from gastric AVM which was identified per EGD in 2014 and he was also diagnosed with H. pylori gastritis at that time which was treated with Pylera and Nexium.   Past GI procedures:  EGD 10/10/2012 by Dr. 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  The mucosa had granularity, clefts and hypertrophic  papillae, multiple biopsies The remainder of the upper endoscopy exam was otherwise normal 1. Surgical [P], duodenal bulb & 2nd portion duodenum, bx - BENIGN DUODENAL MUCOSA. - NO CHANGES OF SPRUE, ACTIVE INFLAMMATION OR GRANULOMAS IDENTIFIED. 2. Surgical [P], gastric antrum & gastric body, bx - FOCALLY ACTIVE CHRONIC GASTRITIS. - POSITIVE FOR H PYLORI COLONIZATION. - NEGATIVE FOR DYSPLASIA AND MALIGNANCY.  Colonoscopy 09/22/2012 by Dr. Carlean Purl: 1 small sessile polyp was removed from the colon -Recall colonoscopy 6 2019 Biopsy results: 1. Colon, polyp(s), right - TUBULAR ADENOMA AND FRAGMENTS OF UNREMARKABLE COLONIC MUCOSA. NO INFLAMMATORY CHANGES, HIGH GRADE DYSPLASIA OR MALIGNANCY. 2. Colon, biopsy, random - UNREMARKABLE COLONIC MUCOSA. NO MICROSCOPIC COLITIS, ACTIVE INFLAMMATION OR GRANULOMAS.  Past Medical History:  Diagnosis Date    iron deficiency 01/18/2013   Angioedema    BPH (benign prostatic hyperplasia)    COPD (chronic obstructive pulmonary disease) ([place])    STates he has copd   Essential hypertension    GERD (gastroesophageal reflux disease)    Helicobacter pylori gastritis 10/10/2012   Hyperlipidemia    IBS (irritable bowel syndrome)    Impotence    Peripheral vascular disease, unspecified ([place])    Personal history of colonic adenoma 09/22/2012   Personal history of tobacco use, presenting hazards to health     Past Surgical History:  Procedure Laterality Date   abdominal surg for ulcers'  1980   COLONOSCOPY N/A 09/22/2012   Procedure: COLONOSCOPY;  Surgeon: Gatha Mayer, MD;  Location: [facility];  Service: Endoscopy;  Laterality: N/A;   distal aortogram     IVC FILTER INSERTION N/A 06/11/2019   Procedure: IVC FILTER INSERTION;  Surgeon: Waynetta Sandy, MD;  Location: [place] [facility];  Service: Cardiovascular;  Laterality: N/A;   IVC VENOGRAPHY N/A 06/11/2019   Procedure: IVC Venography;  Surgeon: Waynetta Sandy, MD;  Location: [place] [facility];  Service: Cardiovascular;  Laterality: N/A;   percutaneous transluminal angionplasty with placement of 2 self expanding stent in the distal left superfical femeral artery      Prior to Admission medications   Medication Sig Start Date End Date Taking? Authorizing Provider  ciprofloxacin (CIPRO) 250 MG tablet Take 1 tablet (250 mg total) by mouth every 12 (twelve) hours. Patient not taking: Reported on 11/27/2020 08/17/19   Virgel Manifold, MD  ferrous sulfate 325 (65 FE) MG tablet Take 325 mg by mouth daily with breakfast.    [provider]  finasteride (PROSCAR) 5 MG tablet Take 5 mg by mouth daily.    [provider]  simvastatin (ZOCOR) 20 MG tablet Take 20 mg by mouth daily.    [provider]  traMADol (ULTRAM) 50 MG tablet TAKE 1 TABLET (50 MG TOTAL) BY MOUTH EVERY 6 (SIX) HOURS AS NEEDED. Patient taking differently: Take 50 mg by mouth every 6 (six) hours as needed for moderate pain. 11/08/19  Biagio Borg, MD    Current Facility-Administered Medications  Medication Dose Route Frequency Provider Last Rate Last Admin   0.9 %  sodium chloride infusion (Manually program via Guardrails IV Fluids)   Intravenous Once Eugenie Filler, MD       acetaminophen (TYLENOL) tablet 650 mg  650 mg Oral Q6H PRN Eugenie Filler, MD       Or   acetaminophen (TYLENOL) suppository 650 mg  650 mg Rectal Q6H PRN Eugenie Filler, MD       acetaminophen (TYLENOL) tablet 650 mg  650 mg Oral Once Eugenie Filler, MD       albuterol (PROVENTIL) (2.5 MG/3ML) 0.083% nebulizer solution 2.5 mg  2.5 mg Nebulization Q2H PRN Eugenie Filler, MD       aztreonam (AZACTAM) 1 g in sodium chloride 0.9 % 100 mL IVPB  1 g Intravenous Q8H Eudelia Bunch, RPH   Stopped at 11/28/20 4801   Chlorhexidine Gluconate Cloth 2 % PADS 6 each  6 each Topical Daily Eugenie Filler, MD       diphenhydrAMINE (BENADRYL) capsule 25 mg  25 mg Oral Once Eugenie Filler, MD        ferrous sulfate tablet 325 mg  325 mg Oral Q breakfast Eugenie Filler, MD       finasteride (PROSCAR) tablet 5 mg  5 mg Oral Daily Eugenie Filler, MD   5 mg at 65/53/74 8270   folic acid (FOLVITE) tablet 1 mg  1 mg Oral Daily Eugenie Filler, MD   1 mg at 11/27/20 2109   furosemide (LASIX) injection 20 mg  20 mg Intravenous Once Eugenie Filler, MD       ipratropium (ATROVENT) nebulizer solution 0.5 mg  0.5 mg Nebulization Q2H PRN Eugenie Filler, MD       metoprolol tartrate (LOPRESSOR) injection 5 mg  5 mg Intravenous Q6H PRN Eugenie Filler, MD       metroNIDAZOLE (FLAGYL) IVPB 500 mg  500 mg Intravenous Q12H Eugenie Filler, MD   Stopped at 11/27/20 2328   multivitamin with minerals tablet 1 tablet  1 tablet Oral Daily Eugenie Filler, MD   1 tablet at 11/27/20 2109   ondansetron (ZOFRAN) tablet 4 mg  4 mg Oral Q6H PRN Eugenie Filler, MD       Or   ondansetron [facility]) injection 4 mg  4 mg Intravenous Q6H PRN Eugenie Filler, MD       pantoprazole (PROTONIX) injection 40 mg  40 mg Intravenous Q12H Eugenie Filler, MD       senna-docusate (Senokot-S) tablet 1 tablet  1 tablet Oral QHS PRN Eugenie Filler, MD       sodium bicarbonate 150 mEq in sterile water 1,150 mL infusion   Intravenous Continuous Eugenie Filler, MD       sodium chloride flush (NS) 0.9 % injection 3 mL  3 mL Intravenous Q12H Eugenie Filler, MD   3 mL at 11/27/20 2110   sorbitol 70 % solution 30 mL  30 mL Oral Daily PRN Eugenie Filler, MD       umeclidinium bromide (INCRUSE ELLIPTA) 62.5 MCG/INH 1 puff  1 puff Inhalation Daily Eugenie Filler, MD       [START ON 11/29/2020] vancomycin (VANCOCIN) IVPB 1000 mg/200 mL premix  1,000 mg Intravenous Q48H Eudelia Bunch, RPH        Allergies as of  11/27/2020 - Review Complete 11/27/2020  Allergen Reaction Noted   Ace inhibitors  05/19/2007   Penicillins Hives 02/01/2007    Family History  Problem Relation Age of Onset    Alcohol abuse Father    Kidney disease Brother    Drug abuse Brother    Diabetes Brother    Diabetes Mother    Diabetes Brother    Colon cancer Neg Hx     Social History   Socioeconomic History   Marital status: Married    Spouse name: Not on file   Number of children: 4   Years of education: Not on file   Highest education level: Not on file  Occupational History   Not on file  Tobacco Use   Smoking status: Former    Packs/day: 0.25    Years: 50.00    Pack years: 12.50    Types: Cigarettes    Start date: 04/12/1957    Quit date: 04/12/2014    Years since quitting: 6.6   Smokeless tobacco: Never   Tobacco comments:    using chatix; stopped now; took x 1 week / had stopped 3 years prior to starting back in 2005  Vaping Use   Vaping Use: Never used  Substance and Sexual Activity   Alcohol use: No    Alcohol/week: 0.0 standard drinks    Comment: OCC   Drug use: Yes    Comment: States marijuanna relaxes him;    Sexual activity: Not Currently    Comment: marijuanna  Other Topics Concern   Not on file  Social History Narrative   Former smoker-quit fall of 2010   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)   Alcohol use- occasionally   No illict drugs   Social Determinants of Radio broadcast assistant Strain: Not on file  Food Insecurity: Not on file  Transportation Needs: Not on file  Physical Activity: Not on file  Stress: Not on file  Social Connections: Not on file  Intimate Partner Violence: Not on file    Review of Systems: Limited review of systems as patient is a poor historian in the setting of acute illness and sepsis  Physical Exam: Vital signs in last 24 hours: Temp:  [85 F (29.4 C)-96.3 F (35.7 C)] 96.3 F (35.7 C) (08/19 0823) Pulse Rate:  [58-88] 79 (08/19 0700) Resp:  [8-19] 14 (08/19 0823) BP: (84-160)/(51-149) 122/71 (08/19 0815) SpO2:  [100 %] 100 % (08/19 0800) Weight:  [65 kg-65.8 kg] 65 kg (08/18 1900) Last BM Date: 11/28/20  Wt  Readings from Last 3 Encounters:  11/27/20 65 kg  07/06/19 57.3 kg  06/09/19 68 kg    General:  Alert 80 year old male cachectic ill appearing male.  Head:  Normocephalic and atraumatic. Eyes:  No scleral icterus. Conjunctiva pink. Ears:  Normal auditory acuity. Nose:  No deformity, discharge or lesions. Mouth: Absent dentition. No ulcers or lesions.  Neck:  Supple. No lymphadenopathy or thyromegaly.  Lungs: Breath sounds clear throughout. Heart: Regular rate and rhythm, no murmurs. Abdomen: Concave abdomen, lack of adipose tissue.  Rib cage protrudes.  Nontender.  Positive bowel sounds all 4 quadrants. Rectal: Deferred.  Rectal tube in place with approximately 100 cc of loose liquid brown stool. Musculoskeletal:  Symmetrical without gross deformities.  Pulses:  Normal pulses noted. Extremities:  RLW with 1+ edema. Neurologic:  Alert and  oriented x4. No focal deficits.  Skin: Duoderm/protectant drsgs to heels and right and left hip area. Psych:  Alert without agitation.  Intake/Output from previous day: 08/18 0701 - 08/19 0700 In: 16393.5 [I.V.:301.1; IV Piggyback:16017.4] Out: 175 [Urine:75; Stool:100] Intake/Output this shift: Total I/O In: 1448.9 [I.V.:1026.7; Blood:335; IV Piggyback:87.2] Out: -   Lab Results: Recent Labs    11/27/20 1328 11/28/20 0254  WBC 5.2 2.7*  HGB 8.6* 6.6*  HCT 28.2* 20.5*  PLT 161 124*   BMET Recent Labs    11/27/20 1328 11/27/20 1927 11/28/20 0254  NA 132* 133* 137  K 5.2* 4.7 4.2  CL 104 107 111  CO2 14* 15* 16*  GLUCOSE 256* 130* 81  BUN 86* 75* 80*  CREATININE 2.66* 2.51* 2.55*  CALCIUM 8.2* 8.0* 8.0*   LFT Recent Labs    11/28/20 0254  PROT 5.4*  ALBUMIN 3.0*  AST 99*  ALT 34  ALKPHOS 75  BILITOT 0.8   PT/INR Recent Labs    11/27/20 1328  LABPROT 14.4  INR 1.1   Hepatitis Panel Recent Labs    11/27/20 1927  HEPBSAG NON REACTIVE  HCVAB NON REACTIVE  HEPAIGM NON REACTIVE  HEPBIGM NON REACTIVE       Studies/Results: US RENAL  Result Date: 11/27/2020 CLINICAL DATA:  Acute renal failure. EXAM: RENAL / URINARY TRACT ULTRASOUND COMPLETE COMPARISON:  June 29, 2019 FINDINGS: Right Kidney: Renal measurements: 9.5 cm x 4.0 cm x 4.4 cm = volume: 87.0 mL. Diffusely increased echogenicity of the renal parenchyma is seen. 1.6 cm x 1.5 cm x 1.5 cm and 1.0 cm x 1.0 cm x 0.8 cm anechoic structures are seen within the right kidney. No abnormal flow is noted within these areas on color Doppler evaluation. There is moderate severity right-sided hydronephrosis. Left Kidney: Renal measurements: 9.1 cm x 4.3 cm x 3.6 cm = volume: 73.7 mL. Diffusely increased echogenicity of the renal parenchyma is seen. 2.5 cm x 2.0 cm x 2.0 cm and 3.4 cm x 2.2 cm x 2.3 cm anechoic structures are seen within the left kidney. No abnormal flow is noted within these areas on color Doppler evaluation. A 1.1 cm shadowing echogenic focus is seen within the left kidney. There is moderate severity left-sided hydronephrosis. Bladder: A Foley catheter is present within the urinary bladder. Other: The study is limited secondary to the patient's body habitus and inability to take deep inspiration. IMPRESSION: 1. Bilateral echogenic kidneys likely secondary to medical renal disease. 2. Moderate severity bilateral hydronephrosis. 3. 1.1 cm nonobstructing left renal calculus. 4. Bilateral simple renal cysts. Electronically Signed   By: Virgina Norfolk M.D.   On: 11/27/2020 20:04   DG Pelvis Portable  Result Date: 11/27/2020 CLINICAL DATA:  [place] EXAM: PORTABLE PELVIS 1-2 VIEWS COMPARISON:  CT 08/17/2019 FINDINGS: There is no evidence of pelvic fracture or diastasis. No pelvic bone lesions are seen. Extensive aortoiliac atheromatous calcifications. Retrievable IVC filter. Spondylitic changes in the visualized lower lumbar spine. IMPRESSION: No acute findings. Electronically Signed   By: Lucrezia Europe M.D.   On: 11/27/2020 15:22   DG Chest Port 1  View  Result Date: 11/27/2020 CLINICAL DATA:  [place] EXAM: PORTABLE CHEST 1 VIEW COMPARISON:  Chest radiograph 06/09/2019 FINDINGS: The cardiomediastinal silhouette is within normal limits. The lungs are clear, with no focal consolidation or pulmonary edema. There is no pleural effusion or pneumothorax. There is no acute osseous abnormality. IMPRESSION: No radiographic evidence of acute cardiopulmonary process. Electronically Signed   By: Valetta Mole M.D.   On: 11/27/2020 15:21    IMPRESSION/PLAN:  36) 80 year old male with a past history of  IDA secondary to chronic GI blood loss, likely from a gastric AVM admitted to the hospital with failure to thrive, suspected urosepsis and anemia. Admission Hg 8.6 (base line Hg 8.8 on 08/17/2019) -> Hg 6.6. Two units of PRBCs transfused.  Iron 43. TIBC 289. Ferritin 192. B12 976. On Ferrous Sulfate 3662m QD. Patient vomited brown liquid emesis x 1 yesterday, no frank red hematemesis.  -Await post transfusion H/H -Transfuse for Hg < 7 -Continue Pantoprazole 485mIV Q 12 hrs  -Zofran 62m77mo or IV Q 6 hrs PRN  -No plans for endoscopic evaluation today, if his Hg continues to drop or if he demonstrates active hematemesis or melena an EGD vs small bowel enteroscopy to be considered  -Diet as tolerated   2) Acute on chronic diarrhea. C. Diff negative. GI pathogen panel pending  -Await GI pathogen panel results  -Continue IV fluids  -No plans for a colonoscopy at this time   3) AKI. Cr 2.66 -> 2.55  4) Elevated LFTs. AST 99. ALT 34. Acute hepatitis panel negative. No history of alcohol use disorder.  -RUQ sonogram in a few days -Trend LFTs  5) Thrombocytopenia, likely due to sepsis. PLT count 124.  6) Failure to thrive, severely malnourished. Albumin 3.0. Received IV Albumin. -RD consult   7) Suspected urosepsis on Aztreonam, Flagyl and Vanco. Urine cx pending. Admission temp 646F -> 96.46F. Bear hugger in use.   8) Remote history of H. Pylori  9) History  of a tubular adenomatous colon polyps per colonoscopy in 2014  10) History of CVA  11) History of PVD  12) Bilateral hip wounds   13) History of DVT s/p IVC filter not on anticoagulation    ColNoralyn Pick/19/2022, 11:21 AM    Attending physician's note   I have reviewed the chart and examined the patient.  History cannot be obtained from the patient.  I agree with the Advanced Practitioner's note, impression and recommendations.   [age]yr with multiple comorbidities, failure to thrive, severely malnourished with multiple decubiti. Adm d/t urosepsis. GI consulted d/t IDA.  No overt GI bleeding.  Also had diarrhea (better with Imodium) with neg C. Difficile, likely antibiotic associated diarrhea.   Recommend -Supportive treatment. -Follow GI pathogens. -No need for any endoscopic procedures. -Recommend palliative/hospice care. -Please call if with any ?.   Marland KitchenRaj Carmell Austria LebaVelora [phone]

## 2020-11-28 NOTE — Consult Note (Signed)
[facility] Nurse wound consult note  Consultation completed by use of remote telehealth camera cart/Elink technology and with assistance from bedside nurse/clinical staff  Reason for Consult: pressure injuries Frail gentleman from home; cared for by wife however she is requesting help/placement at this point. Ribs able to be visualized  Patient had fallen from bed Wound type: Right trochanter; Unstageable Pressure Injury Left trochanter: Unstageable Pressure Injury x 2, (1) Stage 2 Pressure Injury Pressure Injury POA: Yes Measurement: Right 9cm x 6.5cm x 0cm  Three areas left; 2cm x 2cm at largest Wound bed: Right; 100% black hard eschar Left most proximal; 100% pink and clean Left medial; 90% black/10% pink Left distal; 50% black/50% pink Drainage (amount, consistency, odor) scant, serosanguinous each side, however nurse does report foul odor Periwound: intact;  Dressing procedure/placement/frequency: Hydrocolloid to the right trochanter; it is too hard to use enzymatic debridement at this time.  This dressing will promote autolysis and can be re-evaluated next week for change to enzyme Enzymatic debridement to the left trochanter wounds that are necrotic.  Low air loss mattress in place while in the ICU for moisture management and pressure redistribution.  Prophylactic dressings in place for elbows and heels.  Air mattress to be ordered if patient transfers from floor Nutritional supplementation for wound healing  Discussed POC with patient and bedside nurse.  Re consult if needed, will not follow at this time. Thanks  Zaiyden Strozier [employer], RN,CWOCN, CNS, [place] [phone])

## 2020-11-28 NOTE — Progress Notes (Signed)
Initial Nutrition Assessment  DOCUMENTATION CODES:   Severe malnutrition in context of social or environmental circumstances  INTERVENTION:  - will order Boost Breeze TID, each supplement provides 250 kcal and 9 grams of protein. - will order Ensure Plus once/day, each supplement provides 350 kcal and 20 grams of protein. - will order 1 packet Juven BID, each packet provides 95 calories, 2.5 grams of protein (collagen), and 9.8 grams of carbohydrate (3 grams sugar); also contains 7 grams of L-arginine and L-glutamine, 300 mg vitamin C, 15 mg vitamin E, 1.2 mcg vitamin B-12, 9.5 mg zinc, 200 mg calcium, and 1.5 g  Calcium Beta-hydroxy-Beta-methylbutyrate to support wound healing.  - if within [place], recommend small bore NGT vs PEG placement and initiation of TF as patient is very unlikely to meet nutrition needs orally.    NUTRITION DIAGNOSIS:   Severe Malnutrition related to social / environmental circumstances as evidenced by severe fat depletion, severe muscle depletion.  GOAL:   Patient will meet greater than or equal to 90% of their needs  MONITOR:   PO intake, Supplement acceptance, Labs, Weight trends, Skin  REASON FOR ASSESSMENT:   Malnutrition Screening Tool  ASSESSMENT:   80 year old male with medical history of iron deficiency anemia, BPH, chronic bladder outlet obstruction, COPD, HTN, GERD, and PVD. He presented to the ED via EMS after rolling out of bed and being found on the floor by his wife. In the ED he was noted to have profound hypothermia, mottled lower extremities, and to be covered in feces and bedbugs with bilateral hip wounds. He was admitted with severe sepsis, placed on Bair hugger, and started on IV abx.  Diet advanced from NPO to Dysphagia 3, thin liquids yesterday at 1825. No intakes documented since that time.  Able to talk with RN prior to entering patient's room. SLP evaluation pending but has not been able to occur yet. Patient has not had anything PO  yet today. Lunch arrived shortly before RD entered his room and RN plans to aid in feeding patient.  Patient's wife is at bedside. She is extremely hard of hearing and this made it difficult to obtain reliable information from her. Patient sleeping throughout visit.   He has dentures which are present at bedside. Unsure if these fit well or are ill-fitting. Wife does report that patient has not had any solid food in the past 6 months, that he only drinks. When asked what he drinks, she reports he only consumes water and juice. Unable to obtain any further information on this.   He has not been seen by a [place] RD at any time in the past.   Weight yesterday was 143 lb and weight has been stable since 05/11/19. Moderate pitting edema to BLE documented in the edema section of flow sheet.    Labs reviewed; BUN: 80 mg/dl, creatinine: 2.55 mg/dl, Ca: 8 mg/dl, Phos: 5.7 mg/dl, GFR: 25 ml/min.  Medications reviewed; 325 mg ferrous sulfate/day, 1 mg folvite/day, 20 mg IV lasix x1 dose 8/19, 1 tablet multivitamin with minerals/day.  IVF; 150 mEq sodium bicarb in sterile water @ 125 ml/hr.     NUTRITION - FOCUSED PHYSICAL EXAM:  Flowsheet Row Most Recent Value  Orbital Region Severe depletion  Upper Arm Region Severe depletion  Thoracic and Lumbar Region Severe depletion  Buccal Region Severe depletion  Temple Region Moderate depletion  Clavicle Bone Region Severe depletion  Clavicle and Acromion Bone Region Severe depletion  Scapular Bone Region Unable to assess  Dorsal Hand Severe depletion  Patellar Region Unable to assess  Anterior Thigh Region Unable to assess  Posterior Calf Region Unable to assess  Edema (RD Assessment) Unable to assess  Hair Reviewed  Eyes Unable to assess  Mouth Unable to assess  Skin Reviewed  Nails Reviewed       Diet Order:   Diet Order             DIET DYS 3 Room service appropriate? Yes; Fluid consistency: Thin  Diet effective now                    EDUCATION NEEDS:   Not appropriate for education at this time  Skin:  Skin Assessment: Skin Integrity Issues: Skin Integrity Issues:: Unstageable Unstageable: full thickness to bilateral thighs  Last BM:  8/18 (type 7 x2)  Height:   Ht Readings from Last 1 Encounters:  11/27/20 '5\' 11"'$  (1.803 m)    Weight:   Wt Readings from Last 1 Encounters:  11/27/20 65 kg     Estimated Nutritional Needs:  Kcal:  2000-2250 kcal Protein:  105-120 grams Fluid:  >/= 2.5 L/day     Jarome Matin, MS, RD, LDN, CNSC Inpatient Clinical Dietitian RD pager # available in [place]  After hours/weekend pager # available in [facility]

## 2020-11-28 NOTE — Plan of Care (Signed)
Educated patient

## 2020-11-28 NOTE — Progress Notes (Signed)
PHARMACY - PHYSICIAN COMMUNICATION CRITICAL VALUE ALERT - BLOOD CULTURE IDENTIFICATION (BCID)  Henry Carter is an 80 y.o. male who presented to [facility] on 11/27/2020 with a chief complaint of  Chief Complaint  Patient presents with   Failure To Thrive    Coming from home with wife- [name] was dirty and in a disarray.  ? bedbugs     Assessment:  sepsis     Name of physician (or Provider) Contacted: Dr. Grandville Silos  Current antibiotics: Ceftriaxone, Flagyl  Changes to prescribed antibiotics recommended:  Continue current antibiotics   Results for orders placed or performed during the hospital encounter of 11/27/20  Blood Culture ID Panel (Reflexed) (Collected: 11/27/2020  1:44 PM)  Result Value Ref Range   Enterococcus faecalis NOT DETECTED NOT DETECTED   Enterococcus Faecium NOT DETECTED NOT DETECTED   Listeria monocytogenes NOT DETECTED NOT DETECTED   Staphylococcus species NOT DETECTED NOT DETECTED   Staphylococcus aureus (BCID) NOT DETECTED NOT DETECTED   Staphylococcus epidermidis NOT DETECTED NOT DETECTED   Staphylococcus lugdunensis NOT DETECTED NOT DETECTED   Streptococcus species NOT DETECTED NOT DETECTED   Streptococcus agalactiae NOT DETECTED NOT DETECTED   Streptococcus pneumoniae NOT DETECTED NOT DETECTED   Streptococcus pyogenes NOT DETECTED NOT DETECTED   A.calcoaceticus-baumannii NOT DETECTED NOT DETECTED   Bacteroides fragilis NOT DETECTED NOT DETECTED   Enterobacterales NOT DETECTED NOT DETECTED   Enterobacter cloacae complex NOT DETECTED NOT DETECTED   Escherichia coli NOT DETECTED NOT DETECTED   Klebsiella aerogenes NOT DETECTED NOT DETECTED   Klebsiella oxytoca NOT DETECTED NOT DETECTED   Klebsiella pneumoniae NOT DETECTED NOT DETECTED   Proteus species NOT DETECTED NOT DETECTED   Salmonella species NOT DETECTED NOT DETECTED   Serratia marcescens NOT DETECTED NOT DETECTED   Haemophilus influenzae NOT DETECTED NOT DETECTED   Neisseria meningitidis NOT  DETECTED NOT DETECTED   Pseudomonas aeruginosa NOT DETECTED NOT DETECTED   Stenotrophomonas maltophilia NOT DETECTED NOT DETECTED   Candida albicans NOT DETECTED NOT DETECTED   Candida auris NOT DETECTED NOT DETECTED   Candida glabrata NOT DETECTED NOT DETECTED   Candida krusei NOT DETECTED NOT DETECTED   Candida parapsilosis NOT DETECTED NOT DETECTED   Candida tropicalis NOT DETECTED NOT DETECTED   Cryptococcus neoformans/gattii NOT DETECTED NOT DETECTED    Royetta Asal, PharmD, BCPS 11/28/2020 7:21 PM

## 2020-11-28 NOTE — Progress Notes (Signed)
BLE venous duplex and IVC/iliac duplex has been completed.  Preliminary findings given to Dr. Silas Flood.  Results can be found under chart review under CV PROC. 11/28/2020 11:29 AM Amberley Hamler RVT, RDMS

## 2020-11-28 NOTE — Consult Note (Signed)
Reason for Consult: Urinary Retention, Acute on Chronic Renal Failure, Urosepsis, Elevated PSA Referring Physician:  Irine Seal MD  Henry Carter is an 80 y.o. male.   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 Prostate vol 1170mby CT 2021.   2 - Acute Renal Failure - Cr 2.5 on admission 11/2020 up from baselie <1, bialteral hydro c/w bladder level obstruction as epr above. Also very poor PO intake x mos per family.   3 - Urosepsis - lactic acidosis, hypothermia, bacteruria c/w sepsis form likely urinary source. UCX 8/18 pending. Placed on empiric Aztreonam.  4 - Elevated PSA - long h/o elevated PSA and has refused biopsy. CT 2021 w/o overt metastatic disease.  2020 - PSA 20 2021 - PSA 30  2022 - PSA (pending)  5 - [facility] - pt non-ambulatory with progressive cachexia x mos per family, they are no longer able to care for him.   PMH sig for DMD1 (A1c 6), medical non-compliance, DVT/IVF Filter, AAA.   Today "Henry Carter seen in consultation for above.    Past Medical History:  Diagnosis Date    iron deficiency 01/18/2013   Angioedema    BPH (benign prostatic hyperplasia)    COPD (chronic obstructive pulmonary disease) ([facility]   STates he has copd   Essential hypertension    GERD (gastroesophageal reflux disease)    Helicobacter pylori gastritis 10/10/2012   Hyperlipidemia    IBS (irritable bowel syndrome)    Impotence    Peripheral vascular disease, unspecified ([facility]   Personal history of colonic adenoma 09/22/2012   Personal history of tobacco use, presenting hazards to health     Past Surgical History:  Procedure Laterality Date   abdominal surg for ulcers'  1980   COLONOSCOPY N/A 09/22/2012   Procedure: COLONOSCOPY;  Surgeon: Henry Carter;  [facility];  Service: Endoscopy;  Laterality: N/A;   distal aortogram     IVC FILTER INSERTION N/A 06/11/2019    Procedure: IVC FILTER INSERTION;  Surgeon: Henry Carter;  Location: [facility];  Service: Cardiovascular;  Laterality: N/A;   IVC VENOGRAPHY N/A 06/11/2019   Procedure: IVC Venography;  Surgeon: Henry Carter;  [facility];  Service: Cardiovascular;  Laterality: N/A;   percutaneous transluminal angionplasty with placement of 2 self expanding stent in the distal left superfical femeral artery      Family History  Problem Relation Age of Onset   Alcohol abuse Father    Kidney disease Brother    Drug abuse Brother    Diabetes Brother    Diabetes Mother    Diabetes Brother    Colon cancer Neg Hx     Social History:  reports that he quit smoking about 6 years ago. His smoking use included cigarettes. He started smoking about 63 years ago. He has a 12.50 pack-year smoking history. He has never used smokeless tobacco. He reports current drug use. He reports that he does not drink alcohol.  Allergies:  Allergies  Allergen Reactions   Ace Inhibitors     REACTION: angioedema   Penicillins Hives    Has patient had a PCN reaction causing immediate rash, facial/tongue/throat swelling, SOB or lightheadedness with hypotension: No Has patient had a PCN reaction causing severe rash involving mucus membranes or skin necrosis: No Has patient had a PCN reaction  that required hospitalization yes Has patient had a PCN reaction occurring within the last 10 years: no If all of the above answers are "NO", then may proceed with Cephalosporin use.    Medications: I have reviewed the patient's current medications.  Results for orders placed or performed during the hospital encounter of 11/27/20 (from the past 48 hour(s))  CBG monitoring, ED     Status: Abnormal   Collection Time: 11/27/20  1:05 PM  Result Value Ref Range   Glucose-Capillary 54 (L) 70 - 99 mg/dL    Comment: Glucose reference range applies only to samples taken after fasting for at  least 8 hours.  Lactic acid, plasma     Status: Abnormal   Collection Time: 11/27/20  1:28 PM  Result Value Ref Range   Lactic Acid, Venous 3.9 (HH) 0.5 - 1.9 mmol/L    Comment: CRITICAL RESULT CALLED TO, READ BACK BY AND VERIFIED WITH: FRANKLIN,C. RN '@[id number]'$  ON 08.18.2022 BY Henry Carter Performed at [facility], [place] [address], [place], [place] [address]   Comprehensive metabolic panel     Status: Abnormal   Collection Time: 11/27/20  1:28 PM  Result Value Ref Range   Sodium 132 (L) 135 - 145 mmol/L   Potassium 5.2 (H) 3.5 - 5.1 mmol/L   Chloride 104 98 - 111 mmol/L   CO2 14 (L) 22 - 32 mmol/L   Glucose, Bld 256 (H) 70 - 99 mg/dL    Comment: Glucose reference range applies only to samples taken after fasting for at least 8 hours.   BUN 86 (H) 8 - 23 mg/dL   Creatinine, Ser 2.66 (H) 0.61 - 1.24 mg/dL   Calcium 8.2 (L) 8.9 - 10.3 mg/dL   Total Protein 5.4 (L) 6.5 - 8.1 g/dL   Albumin 2.6 (L) 3.5 - 5.0 g/dL   AST 64 (H) 15 - 41 U/L   ALT 32 0 - 44 U/L   Alkaline Phosphatase 92 38 - 126 U/L   Total Bilirubin 1.1 0.3 - 1.2 mg/dL   GFR, Estimated 24 (L) >60 mL/min    Comment: (NOTE) Calculated using the CKD-EPI Creatinine Equation (2021)    Anion gap 14 5 - 15    Comment: Performed at [facility], [place] [address], [place], [place] [address]  CBC WITH DIFFERENTIAL     Status: Abnormal   Collection Time: 11/27/20  1:28 PM  Result Value Ref Range   WBC 5.2 4.0 - 10.5 K/uL    Comment: WHITE COUNT CONFIRMED ON SMEAR ADJUSTED FOR NUCLEATED RBC'S    RBC 3.51 (L) 4.22 - 5.81 MIL/uL   Hemoglobin 8.6 (L) 13.0 - 17.0 g/dL   HCT 28.2 (L) 39.0 - 52.0 %   MCV 80.3 80.0 - 100.0 fL   MCH 24.5 (L) 26.0 - 34.0 pg   MCHC 30.5 30.0 - 36.0 g/dL   RDW 23.9 (H) 11.5 - 15.5 %   Platelets 161 150 - 400 K/uL   nRBC 14.9 (H) 0.0 - 0.2 %   Neutrophils Relative % 95 %   Neutro Abs 5.0 1.7 - 7.7 K/uL   Lymphocytes Relative 0 %   Lymphs Abs 0.0 (L) 0.7 - 4.0 K/uL    Monocytes Relative 4 %   Monocytes Absolute 0.2 0.1 - 1.0 K/uL   Eosinophils Relative 0 %   Eosinophils Absolute 0.0 0.0 - 0.5 K/uL   Basophils Relative 0 %   Basophils Absolute 0.0 0.0 - 0.1 K/uL   WBC  Morphology VACUOLATED NEUTROPHILS    RBC Morphology NRBC'S SEEN    Immature Granulocytes 1 %   Abs Immature Granulocytes 0.06 0.00 - 0.07 K/uL   Acanthocytes PRESENT    Schistocytes PRESENT    Burr Cells PRESENT     Comment: Performed at [facility], [place] [address], [place], [place] [address]  Protime-INR     Status: None   Collection Time: 11/27/20  1:28 PM  Result Value Ref Range   Prothrombin Time 14.4 11.4 - 15.2 seconds   INR 1.1 0.8 - 1.2    Comment: (NOTE) INR goal varies based on device and disease states. Performed at [facility], [place] [address] [place], [place] [address]   APTT     Status: None   Collection Time: 11/27/20  1:28 PM  Result Value Ref Range   aPTT 31 24 - 36 seconds    Comment: Performed at [facility], [place] [address], [place], [place] [address]  Resp Panel by RT-PCR (Flu A&B, Covid) Nasopharyngeal Swab     Status: None   Collection Time: 11/27/20  1:46 PM   Specimen: Nasopharyngeal Swab; Nasopharyngeal(NP) swabs in vial transport medium  Result Value Ref Range   SARS Coronavirus 2 by RT PCR NEGATIVE NEGATIVE    Comment: (NOTE) SARS-CoV-2 target nucleic acids are NOT DETECTED.  The SARS-CoV-2 RNA is generally detectable in upper respiratory specimens during the acute phase of infection. The lowest concentration of SARS-CoV-2 viral copies this assay can detect is 138 copies/mL. A negative result does not preclude SARS-Cov-2 infection and should not be used as the sole basis for treatment or other patient management decisions. A negative result may occur with  improper specimen collection/handling, submission of specimen other than nasopharyngeal swab, presence of viral mutation(s) within  the areas targeted by this assay, and inadequate number of viral copies(<138 copies/mL). A negative result must be combined with clinical observations, patient history, and epidemiological information. The expected result is Negative.  Fact Sheet for Patients:  EntrepreneurPulse.com.au  Fact Sheet for Healthcare Providers:  IncredibleEmployment.be  This test is no t yet approved or cleared by the [place] FDA and  has been authorized for detection and/or diagnosis of SARS-CoV-2 by FDA under an Emergency Use Authorization (EUA). This EUA will remain  in effect (meaning this test can be used) for the duration of the COVID-19 declaration under Section 564(b)(1) of the Act, 21 U.S.C.section 360bbb-3(b)(1), unless the authorization is terminated  or revoked sooner.       Influenza A by PCR NEGATIVE NEGATIVE   Influenza B by PCR NEGATIVE NEGATIVE    Comment: (NOTE) The Xpert Xpress SARS-CoV-2/FLU/RSV plus assay is intended as an aid in the diagnosis of influenza from Nasopharyngeal swab specimens and should not be used as a sole basis for treatment. Nasal washings and aspirates are unacceptable for Xpert Xpress SARS-CoV-2/FLU/RSV testing.  Fact Sheet for Patients: EntrepreneurPulse.com.au  Fact Sheet for Healthcare Providers: IncredibleEmployment.be  This test is not yet approved or cleared by the [place] FDA and has been authorized for detection and/or diagnosis of SARS-CoV-2 by FDA under an Emergency Use Authorization (EUA). This EUA will remain in effect (meaning this test can be used) for the duration of the COVID-19 declaration under Section 564(b)(1) of the Act, 21 U.S.C. section 360bbb-3(b)(1), unless the authorization is terminated or revoked.  Performed at [facility], [place] [address], [place], [place] [address]   Urinalysis, Routine w reflex microscopic Urine,  Catheterized     Status: Abnormal   Collection Time: 11/27/20  1:48 PM  Result Value Ref Range   Color, Urine YELLOW YELLOW   APPearance TURBID (A) CLEAR   Specific Gravity, Urine 1.011 1.005 - 1.030   pH 6.0 5.0 - 8.0   Glucose, UA NEGATIVE NEGATIVE mg/dL   Hgb urine dipstick MODERATE (A) NEGATIVE   Bilirubin Urine NEGATIVE NEGATIVE   Ketones, ur 5 (A) NEGATIVE mg/dL   Protein, ur 100 (A) NEGATIVE mg/dL   Nitrite NEGATIVE NEGATIVE   Leukocytes,Ua MODERATE (A) NEGATIVE    Comment: Performed at [place] [address] [place], [place] [address]  Urinalysis, Microscopic (reflex)     Status: Abnormal   Collection Time: 11/27/20  1:48 PM  Result Value Ref Range   RBC / HPF 21-50 0 - 5 RBC/hpf   WBC, UA >50 0 - 5 WBC/hpf   Bacteria, UA MANY (A) NONE SEEN   Squamous Epithelial / LPF 6-10 0 - 5   Non Squamous Epithelial PRESENT (A) NONE SEEN   WBC Clumps PRESENT    Mucus PRESENT    Urine-Other SPERM PRESENT     Comment: Performed at [facility], [place] [address], [place], [place] [address]  Sodium, urine, random     Status: None   Collection Time: 11/27/20  1:48 PM  Result Value Ref Range   Sodium, Ur 48 mmol/L    Comment: Performed at [facility], [place] [address] [place], [place] [address]  Creatinine, urine, random     Status: None   Collection Time: 11/27/20  1:48 PM  Result Value Ref Range   Creatinine, Urine 22.66 mg/dL    Comment: Performed at [facility], [place] [address], [place], [place] [address]  CBG monitoring, ED     Status: Abnormal   Collection Time: 11/27/20  2:27 PM  Result Value Ref Range   Glucose-Capillary 139 (H) 70 - 99 mg/dL    Comment: Glucose reference range applies only to samples taken after fasting for at least 8 hours.  Lactic acid, plasma     Status: Abnormal   Collection Time: 11/27/20  5:06 PM  Result Value Ref Range   Lactic Acid, Venous >11.0 (HH) 0.5 - 1.9 mmol/L     Comment: CRITICAL RESULT CALLED TO, READ BACK BY AND VERIFIED WITH: CARLY WHIDE ON 11/27/2020 AT 1834 BY PL Performed at [facility], [place] [address], [place], [place] [address]   Type and screen [place]     Status: None (Preliminary result)   Collection Time: 11/27/20  7:00 PM  Result Value Ref Range   ABO/RH(D) O POS    Antibody Screen NEG    Sample Expiration      11/30/2020,2359 Performed at [place] [address] [place], [place] [address]    Unit Number [id number]    Blood Component Type RED CELLS,LR    Unit division 00    Status of Unit ISSUED    Transfusion Status OK TO TRANSFUSE    Crossmatch Result Compatible    Unit Number [id number]    Blood Component Type RBC LR PHER1    Unit division 00    Status of Unit ALLOCATED    Transfusion Status OK TO TRANSFUSE    Crossmatch Result Compatible    Unit Number [id number]    Blood Component Type RBC LR PHER2    Unit division 00  Status of Unit ALLOCATED    Transfusion Status OK TO TRANSFUSE    Crossmatch Result Compatible   Basic metabolic panel     Status: Abnormal   Collection Time: 11/27/20  7:27 PM  Result Value Ref Range   Sodium 133 (L) 135 - 145 mmol/L   Potassium 4.7 3.5 - 5.1 mmol/L   Chloride 107 98 - 111 mmol/L   CO2 15 (L) 22 - 32 mmol/L   Glucose, Bld 130 (H) 70 - 99 mg/dL    Comment: Glucose reference range applies only to samples taken after fasting for at least 8 hours.   BUN 75 (H) 8 - 23 mg/dL   Creatinine, Ser 2.51 (H) 0.61 - 1.24 mg/dL   Calcium 8.0 (L) 8.9 - 10.3 mg/dL   GFR, Estimated 25 (L) >60 mL/min    Comment: (NOTE) Calculated using the CKD-EPI Creatinine Equation (2021)    Anion gap 11 5 - 15    Comment: Performed at [facility], [place] [address] [place], [place] [address]  Magnesium     Status: None   Collection Time: 11/27/20  7:27 PM  Result Value Ref Range   Magnesium 2.1 1.7 - 2.4  mg/dL    Comment: Performed at [facility], [place] [address], [place], [place] [address]  Phosphorus     Status: Abnormal   Collection Time: 11/27/20  7:27 PM  Result Value Ref Range   Phosphorus 5.7 (H) 2.5 - 4.6 mg/dL    Comment: Performed at [facility], [place] [address], [place], [place] [address]  TSH     Status: Abnormal   Collection Time: 11/27/20  7:27 PM  Result Value Ref Range   TSH 4.521 (H) 0.350 - 4.500 uIU/mL    Comment: Performed by a 3rd Generation assay with a functional sensitivity of <=0.01 uIU/mL. Performed at [facility], [place] [address], [place], [place] [address]   Hemoglobin A1c     Status: Abnormal   Collection Time: 11/27/20  7:27 PM  Result Value Ref Range   Hgb A1c MFr Bld 6.0 (H) 4.8 - 5.6 %    Comment: (NOTE) Pre diabetes:          5.7%-6.4%  Diabetes:              >6.4%  Glycemic control for   <7.0% adults with diabetes    Mean Plasma Glucose 125.5 mg/dL    Comment: Performed at [place] [address] [place],  [address]  Procalcitonin     Status: None   Collection Time: 11/27/20  7:27 PM  Result Value Ref Range   Procalcitonin 4.50 ng/mL    Comment:        Interpretation: PCT > 2 ng/mL: Systemic infection (sepsis) is likely, unless other causes are known. (NOTE)       Sepsis PCT Algorithm           Lower Respiratory Tract                                      Infection PCT Algorithm    ----------------------------     ----------------------------         PCT < 0.25 ng/mL                PCT < 0.10 ng/mL  Strongly encourage             Strongly discourage   discontinuation of antibiotics    initiation of antibiotics    ----------------------------     -----------------------------       PCT 0.25 - 0.50 ng/mL            PCT 0.10 - 0.25 ng/mL               OR       >80% decrease in PCT            Discourage initiation of                                             antibiotics      Encourage discontinuation           of antibiotics    ----------------------------     -----------------------------         PCT >= 0.50 ng/mL              PCT 0.26 - 0.50 ng/mL               AND       <80% decrease in PCT              Encourage initiation of                                             antibiotics       Encourage continuation           of antibiotics    ----------------------------     -----------------------------        PCT >= 0.50 ng/mL                  PCT > 0.50 ng/mL               AND         increase in PCT                  Strongly encourage                                      initiation of antibiotics    Strongly encourage escalation           of antibiotics                                     -----------------------------                                           PCT <= 0.25 ng/mL                                                 OR                                        >  80% decrease in PCT                                      Discontinue / Do not initiate                                             antibiotics  Performed at [place] [address], [place], [place] [address]   Hepatitis panel, acute     Status: None   Collection Time: 11/27/20  7:27 PM  Result Value Ref Range   Hepatitis B Surface Ag NON REACTIVE NON REACTIVE   HCV Ab NON REACTIVE NON REACTIVE    Comment: (NOTE) Nonreactive HCV antibody screen is consistent with no HCV infections,  unless recent infection is suspected or other evidence exists to indicate HCV infection.     Hep A IgM NON REACTIVE NON REACTIVE   Hep B C IgM NON REACTIVE NON REACTIVE    Comment: Performed at [facility], [place] [address], [place], [place] [address]  Vitamin B12     Status: Abnormal   Collection Time: 11/27/20  7:27 PM  Result Value Ref Range   Vitamin B-12 976 (H) 180 - 914 pg/mL    Comment: (NOTE) This assay is not validated for testing neonatal  or myeloproliferative syndrome specimens for Vitamin B12 levels. Performed at [facility], [place] [address], [place], [place] [address]   Folate     Status: None   Collection Time: 11/27/20  7:27 PM  Result Value Ref Range   Folate 7.3 >5.9 ng/mL    Comment: Performed at [facility], [place] [address], [place], [place] [address]  Iron and TIBC     Status: Abnormal   Collection Time: 11/27/20  7:27 PM  Result Value Ref Range   Iron 43 (L) 45 - 182 ug/dL   TIBC 289 250 - 450 ug/dL   Saturation Ratios 15 (L) 17.9 - 39.5 %   UIBC 246 ug/dL    Comment: Performed at [facility], [place] [address], [place], [place] [address]  Ferritin     Status: None   Collection Time: 11/27/20  7:27 PM  Result Value Ref Range   Ferritin 192 24 - 336 ng/mL    Comment: Performed at [facility], [place] [address], [place], [place] [address]  Reticulocytes     Status: Abnormal   Collection Time: 11/27/20  7:27 PM  Result Value Ref Range   Retic Ct Pct 1.8 0.4 - 3.1 %   RBC. 2.97 (L) 4.22 - 5.81 MIL/uL   Retic Count, Absolute 52.6 19.0 - 186.0 K/uL   Immature Retic Fract 25.2 (H) 2.3 - 15.9 %    Comment: Performed at [facility], [place] [address], [place], [place] [address]  Lactic acid, plasma     Status: Abnormal   Collection Time: 11/27/20  7:27 PM  Result Value Ref Range   Lactic Acid, Venous 2.1 (HH) 0.5 - 1.9 mmol/L    Comment: CRITICAL VALUE NOTED.  VALUE IS CONSISTENT WITH PREVIOUSLY REPORTED AND CALLED VALUE. Performed at [facility], [place] [address] [place], [place] [address]   C Difficile Quick Screen w PCR reflex     Status:  None   Collection Time: 11/27/20  7:32 PM   Specimen: STOOL  Result Value Ref Range   C Diff antigen NEGATIVE NEGATIVE   C Diff toxin NEGATIVE NEGATIVE   C Diff interpretation No C. difficile detected.     Comment: Performed at [facility],  [place] [address] [place], [place] [address]  Lactic acid, plasma     Status: None   Collection Time: 11/27/20 11:52 PM  Result Value Ref Range   Lactic Acid, Venous 1.0 0.5 - 1.9 mmol/L    Comment: Performed at [facility], [place] [address], [place], [place] [address]  Comprehensive metabolic panel     Status: Abnormal   Collection Time: 11/28/20  2:54 AM  Result Value Ref Range   Sodium 137 135 - 145 mmol/L   Potassium 4.2 3.5 - 5.1 mmol/L   Chloride 111 98 - 111 mmol/L   CO2 16 (L) 22 - 32 mmol/L   Glucose, Bld 81 70 - 99 mg/dL    Comment: Glucose reference range applies only to samples taken after fasting for at least 8 hours.   BUN 80 (H) 8 - 23 mg/dL   Creatinine, Ser 2.55 (H) 0.61 - 1.24 mg/dL   Calcium 8.0 (L) 8.9 - 10.3 mg/dL   Total Protein 5.4 (L) 6.5 - 8.1 g/dL   Albumin 3.0 (L) 3.5 - 5.0 g/dL   AST 99 (H) 15 - 41 U/L   ALT 34 0 - 44 U/L   Alkaline Phosphatase 75 38 - 126 U/L   Total Bilirubin 0.8 0.3 - 1.2 mg/dL   GFR, Estimated 25 (L) >60 mL/min    Comment: (NOTE) Calculated using the CKD-EPI Creatinine Equation (2021)    Anion gap 10 5 - 15    Comment: Performed at [facility], [place] [address] [place], [place] [address]  Magnesium     Status: None   Collection Time: 11/28/20  2:54 AM  Result Value Ref Range   Magnesium 2.2 1.7 - 2.4 mg/dL    Comment: Performed at [facility], [place] [address], [place], [place] [address]  Phosphorus     Status: Abnormal   Collection Time: 11/28/20  2:54 AM  Result Value Ref Range   Phosphorus 5.7 (H) 2.5 - 4.6 mg/dL    Comment: Performed at [facility], [place] [address] [place], [place] [address]  CBC WITH DIFFERENTIAL     Status: Abnormal   Collection Time: 11/28/20  2:54 AM  Result Value Ref Range   WBC 2.7 (L) 4.0 - 10.5 K/uL   RBC 2.69 (L) 4.22 - 5.81 MIL/uL   Hemoglobin 6.6 (LL) 13.0 - 17.0 g/dL    Comment: REPEATED TO VERIFY Reticulocyte  Hemoglobin testing may be clinically indicated, consider ordering this additional test [id number] THIS CRITICAL RESULT HAS VERIFIED AND BEEN CALLED TO RN STONEY BY ALEXIS CRUICKSHANK ON 08 19 2022 AT 0324, AND HAS BEEN READ BACK.     HCT 20.5 (L) 39.0 - 52.0 %   MCV 76.2 (L) 80.0 - 100.0 fL   MCH 24.5 (L) 26.0 - 34.0 pg   MCHC 32.2 30.0 - 36.0 g/dL   RDW 23.0 (H) 11.5 - 15.5 %   Platelets 124 (L) 150 - 400 K/uL   nRBC 6.4 (H) 0.0 - 0.2 %   Neutrophils Relative % 93 %   Neutro Abs 2.5 1.7 - 7.7 K/uL   Lymphocytes Relative 2 %   Lymphs Abs  0.1 (L) 0.7 - 4.0 K/uL   Monocytes Relative 5 %   Monocytes Absolute 0.1 0.1 - 1.0 K/uL   Eosinophils Relative 0 %   Eosinophils Absolute 0.0 0.0 - 0.5 K/uL   Basophils Relative 0 %   Basophils Absolute 0.0 0.0 - 0.1 K/uL   Immature Granulocytes 0 %   Abs Immature Granulocytes 0.01 0.00 - 0.07 K/uL   Schistocytes PRESENT    Burr Cells PRESENT     Comment: Performed at [facility], [place] [address], [place], [place] [address]  Lactic acid, plasma     Status: None   Collection Time: 11/28/20  2:54 AM  Result Value Ref Range   Lactic Acid, Venous 1.0 0.5 - 1.9 mmol/L    Comment: Performed at [facility], [place] [address] [place], [place] [address]  Prepare RBC (crossmatch)     Status: None   Collection Time: 11/28/20  3:45 AM  Result Value Ref Range   Order Confirmation      ORDER PROCESSED BY BLOOD BANK Performed at [place] [address] [place], [place] [address]   Prepare RBC (crossmatch)     Status: None   Collection Time: 11/28/20  7:39 AM  Result Value Ref Range   Order Confirmation      ORDER PROCESSED BY BLOOD BANK Performed at [facility], [place] [address] [place], [place] [address]     US RENAL  Result Date: 11/27/2020 CLINICAL DATA:  Acute renal failure. EXAM: RENAL / URINARY TRACT ULTRASOUND COMPLETE COMPARISON:  June 29, 2019 FINDINGS: Right  Kidney: Renal measurements: 9.5 cm x 4.0 cm x 4.4 cm = volume: 87.0 mL. Diffusely increased echogenicity of the renal parenchyma is seen. 1.6 cm x 1.5 cm x 1.5 cm and 1.0 cm x 1.0 cm x 0.8 cm anechoic structures are seen within the right kidney. No abnormal flow is noted within these areas on color Doppler evaluation. There is moderate severity right-sided hydronephrosis. Left Kidney: Renal measurements: 9.1 cm x 4.3 cm x 3.6 cm = volume: 73.7 mL. Diffusely increased echogenicity of the renal parenchyma is seen. 2.5 cm x 2.0 cm x 2.0 cm and 3.4 cm x 2.2 cm x 2.3 cm anechoic structures are seen within the left kidney. No abnormal flow is noted within these areas on color Doppler evaluation. A 1.1 cm shadowing echogenic focus is seen within the left kidney. There is moderate severity left-sided hydronephrosis. Bladder: A Foley catheter is present within the urinary bladder. Other: The study is limited secondary to the patient's body habitus and inability to take deep inspiration. IMPRESSION: 1. Bilateral echogenic kidneys likely secondary to medical renal disease. 2. Moderate severity bilateral hydronephrosis. 3. 1.1 cm nonobstructing left renal calculus. 4. Bilateral simple renal cysts. Electronically Signed   By: Virgina Norfolk M.D.   On: 11/27/2020 20:04   DG Pelvis Portable  Result Date: 11/27/2020 CLINICAL DATA:  [place] EXAM: PORTABLE PELVIS 1-2 VIEWS COMPARISON:  CT 08/17/2019 FINDINGS: There is no evidence of pelvic fracture or diastasis. No pelvic bone lesions are seen. Extensive aortoiliac atheromatous calcifications. Retrievable IVC filter. Spondylitic changes in the visualized lower lumbar spine. IMPRESSION: No acute findings. Electronically Signed   By: Lucrezia Europe M.D.   On: 11/27/2020 15:22   DG Chest Port 1 View  Result Date: 11/27/2020 CLINICAL DATA:  [place] EXAM: PORTABLE CHEST 1 VIEW COMPARISON:  Chest radiograph 06/09/2019 FINDINGS: The cardiomediastinal silhouette is within normal limits. The  lungs  are clear, with no focal consolidation or pulmonary edema. There is no pleural effusion or pneumothorax. There is no acute osseous abnormality. IMPRESSION: No radiographic evidence of acute cardiopulmonary process. Electronically Signed   By: Valetta Mole M.D.   On: 11/27/2020 15:21    Review of Systems Blood pressure 112/71, pulse 81, temperature (!) 95.4 F (35.2 C), temperature source Bladder, resp. rate 16, height '5\' 11"'$  (1.803 m), weight 65 kg, SpO2 100 %. Physical Exam Vitals reviewed.  Constitutional:      Comments: AO x1. Cachectic, elderly.   HENT:     Head: Normocephalic.     Nose: Nose normal.     Mouth/Throat:     Mouth: Mucous membranes are moist.  Eyes:     Pupils: Pupils are equal, round, and reactive to light.  Cardiovascular:     Rate and Rhythm: Normal rate.  Pulmonary:     Effort: Pulmonary effort is normal.  Abdominal:     General: Abdomen is flat.     Comments: Scaphoid abdomen.   Genitourinary:    Comments: Uncircumcised foreskin reduced with foley in place with medium yellow urine.  Musculoskeletal:     Comments: Cachectic extremities.   Skin:    General: Skin is warm.  Neurological:     General: No focal deficit present.  Psychiatric:        Mood and Affect: Mood normal.    Assessment/Plan:  1 - Urinary Retention - agree with foley, for decompresison and to allow for better hygien as he is too weak to void even into urinal. Do NOT recommend trials of void this admission.   2 - Acute Renal Failure - likely multifactorial with intrinsic renal + pre-renal deydration + prostate level obstruction. Keep current catheter as per above.   3 - Urosepsis - Agree with current ABX pending addditional CX data.   4 - Elevated PSA - overall picture c/w possible advanced prostate cancer, Recheck PSA ordered today as add-on.   5 - End of Life Care - Very limited life expectancy, certianly [id number]. Unless he makes dramatic recovery in functional status with  hydration and infection treatment, comfort care / hospice transition likely most prudent pending family desires.   Will follow and attempt to contact family after PSA results to help set expectations.     Alexis Frock 11/28/2020, 7:59 AM

## 2020-11-28 NOTE — Evaluation (Addendum)
Occupational Therapy Evaluation Patient Details Name: Henry Carter MRN: [id number] DOB: [birth year] Today's Date: 11/28/2020    History of Present Illness Patient 80 year old gentleman history of iron deficiency anemia, BPH, chronic bladder outlet obstruction, COPD, hypertension, GERD, PVD presented to the ED via EMS after he was noted to rolled off the bed onto the floor. Patient admitted with severe sepsis.   Clinical Impression   Mr. Henry Carter is a 80 year old man who presents supine in bed. He is lethargic and requires repetitive cues to answers to answer questions with one to two word responses. Per spouse patient has not walked for approximately 7 months and she has had to feed him for 6 months. He doesn't like solid food and prefers drinks. Patient does exhibit functional range of motion of upper extremities but has generalized weakness. He requires total care for ADLs and bed mobility with minimal engagement. Patient's wife present and tearful. Patient is unfortunately at his baseline and does not exhibit any rehab potential. Therapist recommends palliative services. If wife wants to take patient home recommend hospital bed and services to reduce burden of care. If family unable to provide needed care - recommend LTC for 24/7 nursing care.     Follow Up Recommendations  Other (comment);Supervision/Assistance - 24 hour (vs LTC if family unable to continue caring for patient.)    Equipment Recommendations  None recommended by OT    Recommendations for Other Services Other (comment) (Palliative)     Precautions / Restrictions Precautions Precautions: Fall Precaution Comments: wounds Restrictions Weight Bearing Restrictions: No      Mobility Bed Mobility Overal bed mobility: Needs Assistance Bed Mobility: Rolling Rolling: +2 for physical assistance;Total assist              Transfers                      Balance                                            ADL either performed or assessed with clinical judgement   ADL Overall ADL's : At baseline                                       General ADL Comments: Total care.     Vision   Vision Assessment?: No apparent visual deficits     Perception     Praxis      Pertinent Vitals/Pain Pain Assessment: Faces Faces Pain Scale: No hurt     Hand Dominance Right   Extremity/Trunk Assessment Upper Extremity Assessment Upper Extremity Assessment: Generalized weakness;RUE deficits/detail;LUE deficits/detail RUE Deficits / Details: grossly functional ROM, 3/5 strength LUE Deficits / Details: grossly functional ROM, 3/5 strength   Lower Extremity Assessment Lower Extremity Assessment: Defer to PT evaluation   Cervical / Trunk Assessment Cervical / Trunk Assessment: Normal   Communication     Cognition Arousal/Alertness: Lethargic Behavior During Therapy: Flat affect Overall Cognitive Status: Difficult to assess                                     General Comments       Exercises     Shoulder Instructions  Home Living Family/patient expects to be discharged to:: Unsure Living Arrangements: Spouse/significant other                               Additional Comments: Patient lives at home with wife.      Prior Functioning/Environment Level of Independence: Needs assistance  Gait / Transfers Assistance Needed: Has not ambulate in approximately 7 months per spouse. bed bound. ADL's / Homemaking Assistance Needed: Is total care - wife feeds him. Reports he doesn't eat solid food. Communication / Swallowing Assistance Needed: Minimal          OT Problem List: Decreased strength;Decreased range of motion;Decreased activity tolerance;Impaired balance (sitting and/or standing)      OT Treatment/Interventions:      OT Goals(Current goals can be found in the care plan section) Acute Rehab OT Goals OT  Goal Formulation: All assessment and education complete, DC therapy  OT Frequency:     Barriers to D/C:            Co-evaluation PT/OT/SLP Co-Evaluation/Treatment: Yes Reason for Co-Treatment: To address functional/ADL transfers;For patient/therapist safety;Other (comment) (co-eval)          AM-PAC OT "6 Clicks" Daily Activity     Outcome Measure Help from another person eating meals?: Total Help from another person taking care of personal grooming?: Total Help from another person toileting, which includes using toliet, bedpan, or urinal?: Total Help from another person bathing (including washing, rinsing, drying)?: Total Help from another person to put on and taking off regular upper body clothing?: Total Help from another person to put on and taking off regular lower body clothing?: Total 6 Click Score: 6   End of Session Nurse Communication: Mobility status  Activity Tolerance: Patient limited by lethargy Patient left: in bed;with call bell/phone within reach;with family/visitor present  OT Visit Diagnosis: Muscle weakness (generalized) (M62.81)                Time: [id number] OT Time Calculation (min): 15 min Charges:  OT General Charges $OT Visit: 1 Visit OT Evaluation $OT Eval Low Complexity: 1 Low  Henry Carter, OTR/L [place]  Office [phone] Pager: [phone]   Henry Carter 11/28/2020, 3:18 PM

## 2020-11-28 NOTE — Progress Notes (Signed)
PROGRESS NOTE    Henry Carter  [id number] DOB: [birth year] DOA: 11/27/2020 PCP: [name], MD (Confirm with patient/family/NH records and if not entered, this HAS to be entered at [facility] point of entry. "No PCP" if truly none.)   Chief Complaint  Patient presents with   Failure To Thrive    Coming from home with wife- House was dirty and in a disarray.  ? bedbugs    Brief Narrative:  Patient 80 year old gentleman history of iron deficiency anemia, BPH, chronic bladder outlet obstruction, COPD, hypertension, GERD, PVD presented to the ED via EMS after he was noted to rolled off the bed onto the floor.  Per epic review it is noted that patient's wife presented to PCPs office stating she needed help and could not take care of patient any longer and needed a letter to get patient placed in that patient had rolled off the bed onto the floor.  PCPs office subsequently called EMS patient brought to the ED.  Patient on presentation noted to have a profound hypothermia with a temperature of 85, generalized weakness, mottled lower extremities and noted to be covered in stool and bedbugs with some bilateral hip wounds.  Patient admitted with severe sepsis and placed empirically on IV antibiotics.  Patient placed on Bair hugger and admitted to the stepdown unit.   Assessment & Plan:   Principal Problem:   Severe sepsis with acute organ dysfunction (HCC) Active Problems:   Hypothermia   Hyperlipidemia   Essential hypertension   PERIPHERAL VASCULAR DISEASE   Benign prostatic hyperplasia   Diarrhea   Iron deficiency anemia due to chronic blood loss   Acute renal failure (ARF) (HCC)   Hyperkalemia   Metabolic acidosis   Chronic indwelling Foley catheter   Acute lower UTI   Transaminitis   Severe protein-calorie malnutrition (HCC)   Bilateral edema of lower extremity   Hyperglycemia   Dehydration   Bilateral hydronephrosis  #1 severe sepsis with organ dysfunction,  POA -Patient presented with criteria/signs of severe sepsis with organ dysfunction with severe hypothermia with a temperature of 85, noted to be hypotensive on admission with blood pressure of 84/51, lactic acid level elevated at 3.9, and acute renal failure with a creatinine of 2.66, urinalysis was cloudy and milky, with moderate leukocytes, nitrite negative, many bacteria, > 50 WBCs with concerns for UTI.  Patient also noted with profuse diarrhea. -Patient pancultured with blood cultures pending.  Urine cultures pending. -Chest x-ray with no acute infiltrate. -Repeat lactic acid level trending down.   -C. difficile PCR negative.   -GI pathogen panel pending.   -Continue IV fluids  -.  Status post IV albumin x1.   -Continue empiric IV vancomycin, IV aztreonam, IV Flagyl.   -Bair hugger.   -PCCM was consulted and assessed the patient and have signed off.   -Supportive care.    2.  Hypothermia -Likely secondary to problem #1. -Patient pancultured with cultures pending.  -TSH of 4.521.  -Hypothermia slowly improving.  -Continue empiric IV antibiotics of vancomycin, aztreonam, Flagyl.  -Bair hugger.    3.  Hypotension Likely multifactorial secondary to hypovolemia from GI losses from profuse diarrhea, infectious etiology from probable UTI.  -Patient has been pancultured cultures pending.  -Blood pressure responded to fluid resuscitation.  -Continue IV fluids, empiric IV antibiotics.  -Follow.    4.  Acute renal failure -Likely secondary to prerenal azotemia secondary to profuse ongoing diarrhea from GI losses and post renal azotemia with bilateral hydronephrosis noted on  renal ultrasound. -Renal ultrasound also consistent with medical renal disease. -Creatinine currently at 2.55 from 2.66 on admission. -Patient noted to have a creatinine of 1.29 on 07/19/2019. -.  Urinalysis concerning for UTI with 100 protein.  -Urine sodium 48, urine creatinine 22.66.  -FeNa 4.  -Urine output of 75  cc recorded since admission -Continue Foley catheter which patient be discharged on due to bilateral hydronephrosis. -Check an ANCA panel as patient with PTK. -Strict I's and O's. -Daily weights.   5.  Diarrhea -Patient with profuse ongoing diarrhea found in his feces on presentation. -Patient noted to be hypothermic, concern for severe sepsis. -C. difficile PCR negative.   -GI pathogen panel pending.   -Patient noted to have had a colonoscopy in 2014 which showed a sessile polyp. -Imodium 4 mg p.o. x1, then Imodium 2 mg every 6 hours for the next 24 hours. -Continue Flexi-Seal. -IV fluids. -GI consultation pending.  6.  Dehydration -Likely secondary to poor oral intake, GI losses. -IV fluids.  7.  GERD -PPI.  8.  Hyperkalemia -Likely secondary to acute renal failure. -Resolved.   9.  Metabolic acidosis -Patient with elevated lactic acid level, presenting with profuse watery diarrhea with GI losses, noted to be hypotensive, concern for severe sepsis with urinalysis concerning for UTI. -Patient pancultured. -C. difficile PCR negative.  GI pathogen panel pending.   -Change IV fluids to bicarb drip.   -Continue empiric IV antibiotics.   -Follow.    10.  Lower extremity edema R > L -Patient with lower extremity edema noted on examination with bilateral lower extremity petechiae noted. -Likely secondary to hypoalbuminemia. -Patient noted to have a prior history of lower extremity DVT. -Status post IV albumin x1.   -Lower extremity Dopplers pending.   -Follow.  11.  Transaminitis -Felt likely secondary to shock liver.   -Acute hepatitis panel negative.  -Continue fluid resuscitation.  -??  Abdominal imaging however will defer until patient has been assessed by GI.  -Repeat labs in the morning.  12.  Anemia/pancytopenia -Hemoglobin at 6.6 this morning from 8.6 on admission.   -Likely dilutional effect.   -Patient with no overt bleeding.   -Rectal tube with brown stool.   -Patient with prior history of gastritis, AVM per EGD of 10/10/2012, sessile polyps noted on colonoscopy of 10/10/2012.  -Anemia panel with iron of 43, TIBC of 289, ferritin of 192, folate of 7.3, B12 of 976.   -Check FOBT. -Transfused 2 units packed red blood cells.   -Discontinue heparin. -Place on Protonix 40 mg IV every 12 hours.   -Consult with GI for further evaluation and management.   -Follow H&H. -Transfusion threshold hemoglobin < 7.  13.  Bilateral hydronephrosis/BPH -Renal ultrasound with bilateral hydronephrosis.  -Patient noted to have chronic bladder outlet obstruction and noted to have bilateral hydronephrosis and hydroureter per CT abdomen and pelvis (08/17/2019). -Continue Foley catheter which we will leave in place.   -Continue Proscar.   -Urology consulted to see patient.   -Patient will likely need to be discharged with Foley catheter with outpatient follow-up with urology.  14.  Hyperglycemia -Hemoglobin A1c 6.0.   -Follow.  15.  UTI -Urine cultures pending.   -Continue empiric IV antibiotics.    16.  Severe protein calorie malnutrition/failure to thrive -Patient noted to be found in bed bugs and in feces. -Dietitian consulted.   -Continue dysphagia 3 diet, Ensure.  -TOC consultation due to concern that APS may need to be involved. -It is noted per PCCM  note that patient was previously a patient of a thyroid care from April to September 2021 and is noted that staff reported that notes reflect that daughter did not want him to return home at that time, family refused assist from staff for bath, nursing visits. -Consulted palliative care for goals of care.  17.  Mechanical fall -Plain films of the pelvis unremarkable. -PT/OT. -Will likely need placement.  18.  Lower extremity petechiae -?  Etiology. -Patient noted to have bedbugs on admission. -Check ANCA panel.  GI pathogen panel pending. -Follow.   DVT prophylaxis: SCDs Code Status: Full Family  Communication: Updated patient.  Updated wife on the telephone.   Disposition:   Status is: Inpatient  Remains inpatient appropriate because:Inpatient level of care appropriate due to severity of illness  Dispo: The patient is from: Home              Anticipated d/c is to: TBD              Patient currently is not medically stable to d/c.   Difficult to place patient No       Consultants:  PCCM: Dr. Silas Flood 11/27/2020 Urology pending  Procedures: Transfused 2 units packed red blood cells pending 11/28/2020 Renal ultrasound 11/27/2020 Chest x-ray 11/27/2020 Plain films of the pelvis 11/27/2020   Antimicrobials:  IV vancomycin 11/27/2020>>>> IV aztreonam 11/27/2020>>>>> IV Flagyl 11/27/2020>>>>   Subjective: Patient laying in bed on [facility].  Complaining of left ankle pain.  Denies any chest pain.  No shortness of breath.  No abdominal pain.  Rectal tube with watery brown stool.  No overt bleeding noted  Objective: Vitals:   11/28/20 0815 11/28/20 0823 11/28/20 0839 11/28/20 0840  BP: 122/71 123/71 121/72 121/72  Pulse:      Resp: '13 14 12 12  '$ Temp: (!) 96.3 F (35.7 C) (!) 96.3 F (35.7 C) (!) 96.3 F (35.7 C) (!) 96.4 F (35.8 C)  TempSrc:  Bladder  Bladder  SpO2:  100%  100%  Weight:      Height:        Intake/Output Summary (Last 24 hours) at 11/28/2020 0921 Last data filed at 11/28/2020 0800 Gross per 24 hour  Intake 17842.43 ml  Output 175 ml  Net 17667.43 ml   Filed Weights   11/27/20 1244 11/27/20 1900  Weight: 65.8 kg 65 kg    Examination:  General exam: Cachectic.  Frail.  On Bair hugger Respiratory system: Clear to auscultation bilaterally anterior lung fields.  No wheezes, no crackles, no rhonchi. Respiratory effort normal. Cardiovascular system: S1 & S2 heard, RRR. No JVD, murmurs, rubs, gallops or clicks.  1-2+ bilateral lower extremity edema R > L.  Gastrointestinal system: Abdomen is nondistended, soft and nontender, scaphoid. No  organomegaly or masses felt. Normal bowel sounds heard. Central nervous system: Alert. No focal neurological deficits. Extremities: 1-2+ bilateral lower extremity edema R > L. Skin: Petechiae on feet and lower extremities.  Bilateral hip wounds dried with some scabs noted.   Psychiatry: Judgement and insight appear normal. Mood & affect appropriate.     Data Reviewed: I have personally reviewed following labs and imaging studies  CBC: Recent Labs  Lab 11/27/20 1328 11/28/20 0254  WBC 5.2 2.7*  NEUTROABS 5.0 2.5  HGB 8.6* 6.6*  HCT 28.2* 20.5*  MCV 80.3 76.2*  PLT 161 124*    Basic Metabolic Panel: Recent Labs  Lab 11/27/20 1328 11/27/20 1927 11/28/20 0254  NA 132* 133* 137  K 5.2*  4.7 4.2  CL 104 107 111  CO2 14* 15* 16*  GLUCOSE 256* 130* 81  BUN 86* 75* 80*  CREATININE 2.66* 2.51* 2.55*  CALCIUM 8.2* 8.0* 8.0*  MG  --  2.1 2.2  PHOS  --  5.7* 5.7*    GFR: Estimated Creatinine Clearance: 21.6 mL/min (A) (by C-G formula based on SCr of 2.55 mg/dL (H)).  Liver Function Tests: Recent Labs  Lab 11/27/20 1328 11/28/20 0254  AST 64* 99*  ALT 32 34  ALKPHOS 92 75  BILITOT 1.1 0.8  PROT 5.4* 5.4*  ALBUMIN 2.6* 3.0*    CBG: Recent Labs  Lab 11/27/20 1305 11/27/20 1427  GLUCAP 54* 139*     Recent Results (from the past 240 hour(s))  Resp Panel by RT-PCR (Flu A&B, Covid) Nasopharyngeal Swab     Status: None   Collection Time: 11/27/20  1:46 PM   Specimen: Nasopharyngeal Swab; Nasopharyngeal(NP) swabs in vial transport medium  Result Value Ref Range Status   SARS Coronavirus 2 by RT PCR NEGATIVE NEGATIVE Final    Comment: (NOTE) SARS-CoV-2 target nucleic acids are NOT DETECTED.  The SARS-CoV-2 RNA is generally detectable in upper respiratory specimens during the acute phase of infection. The lowest concentration of SARS-CoV-2 viral copies this assay can detect is 138 copies/mL. A negative result does not preclude SARS-Cov-2 infection and should not  be used as the sole basis for treatment or other patient management decisions. A negative result may occur with  improper specimen collection/handling, submission of specimen other than nasopharyngeal swab, presence of viral mutation(s) within the areas targeted by this assay, and inadequate number of viral copies(<138 copies/mL). A negative result must be combined with clinical observations, patient history, and epidemiological information. The expected result is Negative.  Fact Sheet for Patients:  EntrepreneurPulse.com.au  Fact Sheet for Healthcare Providers:  IncredibleEmployment.be  This test is no t yet approved or cleared by the [place] FDA and  has been authorized for detection and/or diagnosis of SARS-CoV-2 by FDA under an Emergency Use Authorization (EUA). This EUA will remain  in effect (meaning this test can be used) for the duration of the COVID-19 declaration under Section 564(b)(1) of the Act, 21 U.S.C.section 360bbb-3(b)(1), unless the authorization is terminated  or revoked sooner.       Influenza A by PCR NEGATIVE NEGATIVE Final   Influenza B by PCR NEGATIVE NEGATIVE Final    Comment: (NOTE) The Xpert Xpress SARS-CoV-2/FLU/RSV plus assay is intended as an aid in the diagnosis of influenza from Nasopharyngeal swab specimens and should not be used as a sole basis for treatment. Nasal washings and aspirates are unacceptable for Xpert Xpress SARS-CoV-2/FLU/RSV testing.  Fact Sheet for Patients: EntrepreneurPulse.com.au  Fact Sheet for Healthcare Providers: IncredibleEmployment.be  This test is not yet approved or cleared by the [place] FDA and has been authorized for detection and/or diagnosis of SARS-CoV-2 by FDA under an Emergency Use Authorization (EUA). This EUA will remain in effect (meaning this test can be used) for the duration of the COVID-19 declaration under Section  564(b)(1) of the Act, 21 U.S.C. section 360bbb-3(b)(1), unless the authorization is terminated or revoked.  Performed at [facility], [place] [address] [place], [place] [address]   C Difficile Quick Screen w PCR reflex     Status: None   Collection Time: 11/27/20  7:32 PM   Specimen: STOOL  Result Value Ref Range Status   C Diff antigen NEGATIVE NEGATIVE Final   C Diff  toxin NEGATIVE NEGATIVE Final   C Diff interpretation No C. difficile detected.  Final    Comment: Performed at [facility], [place] [address], [place], [place] [address]         Radiology Studies: US RENAL  Result Date: 11/27/2020 CLINICAL DATA:  Acute renal failure. EXAM: RENAL / URINARY TRACT ULTRASOUND COMPLETE COMPARISON:  June 29, 2019 FINDINGS: Right Kidney: Renal measurements: 9.5 cm x 4.0 cm x 4.4 cm = volume: 87.0 mL. Diffusely increased echogenicity of the renal parenchyma is seen. 1.6 cm x 1.5 cm x 1.5 cm and 1.0 cm x 1.0 cm x 0.8 cm anechoic structures are seen within the right kidney. No abnormal flow is noted within these areas on color Doppler evaluation. There is moderate severity right-sided hydronephrosis. Left Kidney: Renal measurements: 9.1 cm x 4.3 cm x 3.6 cm = volume: 73.7 mL. Diffusely increased echogenicity of the renal parenchyma is seen. 2.5 cm x 2.0 cm x 2.0 cm and 3.4 cm x 2.2 cm x 2.3 cm anechoic structures are seen within the left kidney. No abnormal flow is noted within these areas on color Doppler evaluation. A 1.1 cm shadowing echogenic focus is seen within the left kidney. There is moderate severity left-sided hydronephrosis. Bladder: A Foley catheter is present within the urinary bladder. Other: The study is limited secondary to the patient's body habitus and inability to take deep inspiration. IMPRESSION: 1. Bilateral echogenic kidneys likely secondary to medical renal disease. 2. Moderate severity bilateral hydronephrosis. 3. 1.1 cm nonobstructing left  renal calculus. 4. Bilateral simple renal cysts. Electronically Signed   By: Virgina Norfolk M.D.   On: 11/27/2020 20:04   DG Pelvis Portable  Result Date: 11/27/2020 CLINICAL DATA:  [place] EXAM: PORTABLE PELVIS 1-2 VIEWS COMPARISON:  CT 08/17/2019 FINDINGS: There is no evidence of pelvic fracture or diastasis. No pelvic bone lesions are seen. Extensive aortoiliac atheromatous calcifications. Retrievable IVC filter. Spondylitic changes in the visualized lower lumbar spine. IMPRESSION: No acute findings. Electronically Signed   By: Lucrezia Europe M.D.   On: 11/27/2020 15:22   DG Chest Port 1 View  Result Date: 11/27/2020 CLINICAL DATA:  [place] EXAM: PORTABLE CHEST 1 VIEW COMPARISON:  Chest radiograph 06/09/2019 FINDINGS: The cardiomediastinal silhouette is within normal limits. The lungs are clear, with no focal consolidation or pulmonary edema. There is no pleural effusion or pneumothorax. There is no acute osseous abnormality. IMPRESSION: No radiographic evidence of acute cardiopulmonary process. Electronically Signed   By: Valetta Mole M.D.   On: 11/27/2020 15:21        Scheduled Meds:  Chlorhexidine Gluconate Cloth  6 each Topical Daily   ferrous sulfate  325 mg Oral Q breakfast   finasteride  5 mg Oral Daily   folic acid  1 mg Oral Daily   loperamide  2 mg Oral Q6H   loperamide  4 mg Oral Once   multivitamin with minerals  1 tablet Oral Daily   pantoprazole (PROTONIX) IV  40 mg Intravenous Q12H   sodium chloride flush  3 mL Intravenous Q12H   umeclidinium bromide  1 puff Inhalation Daily   Continuous Infusions:  aztreonam Stopped (11/28/20 [id number])   metronidazole Stopped (11/27/20 2328)    sodium bicarbonate (isotonic) infusion in sterile water 125 mL/hr at 11/28/20 0828   [START ON 11/29/2020] vancomycin       LOS: 1 day    Time spent: 45 minutes    Irine Seal, MD [facility]   To contact the  attending provider between 7A-7P or the covering provider during after  hours 7P-7A, please log into the web site [email or web address] and access using universal [place] password for that web site. If you do not have the password, please call the hospital operator.  11/28/2020, 9:21 AM

## 2020-11-29 DIAGNOSIS — R531 Weakness: Secondary | ICD-10-CM

## 2020-11-29 DIAGNOSIS — I82401 Acute embolism and thrombosis of unspecified deep veins of right lower extremity: Secondary | ICD-10-CM | POA: Diagnosis present

## 2020-11-29 DIAGNOSIS — I824Y1 Acute embolism and thrombosis of unspecified deep veins of right proximal lower extremity: Secondary | ICD-10-CM

## 2020-11-29 DIAGNOSIS — A419 Sepsis, unspecified organism: Secondary | ICD-10-CM | POA: Diagnosis not present

## 2020-11-29 DIAGNOSIS — Z7189 Other specified counseling: Secondary | ICD-10-CM

## 2020-11-29 DIAGNOSIS — E876 Hypokalemia: Secondary | ICD-10-CM

## 2020-11-29 DIAGNOSIS — D61818 Other pancytopenia: Secondary | ICD-10-CM

## 2020-11-29 DIAGNOSIS — N401 Enlarged prostate with lower urinary tract symptoms: Secondary | ICD-10-CM | POA: Diagnosis not present

## 2020-11-29 DIAGNOSIS — Z515 Encounter for palliative care: Secondary | ICD-10-CM | POA: Diagnosis not present

## 2020-11-29 DIAGNOSIS — N390 Urinary tract infection, site not specified: Secondary | ICD-10-CM | POA: Diagnosis not present

## 2020-11-29 DIAGNOSIS — N179 Acute kidney failure, unspecified: Secondary | ICD-10-CM | POA: Diagnosis not present

## 2020-11-29 LAB — COMPREHENSIVE METABOLIC PANEL
ALT: 45 U/L — ABNORMAL HIGH (ref 0–44)
AST: 150 U/L — ABNORMAL HIGH (ref 15–41)
Albumin: 2.7 g/dL — ABNORMAL LOW (ref 3.5–5.0)
Alkaline Phosphatase: 73 U/L (ref 38–126)
Anion gap: 13 (ref 5–15)
BUN: 77 mg/dL — ABNORMAL HIGH (ref 8–23)
CO2: 21 mmol/L — ABNORMAL LOW (ref 22–32)
Calcium: 7.6 mg/dL — ABNORMAL LOW (ref 8.9–10.3)
Chloride: 101 mmol/L (ref 98–111)
Creatinine, Ser: 2.26 mg/dL — ABNORMAL HIGH (ref 0.61–1.24)
GFR, Estimated: 29 mL/min — ABNORMAL LOW (ref >60)
Glucose, Bld: 61 mg/dL — ABNORMAL LOW (ref 70–99)
Potassium: 3.3 mmol/L — ABNORMAL LOW (ref 3.5–5.1)
Sodium: 135 mmol/L (ref 135–145)
Total Bilirubin: 0.8 mg/dL (ref 0.3–1.2)
Total Protein: 5.2 g/dL — ABNORMAL LOW (ref 6.5–8.1)

## 2020-11-29 LAB — CBC WITH DIFFERENTIAL/PLATELET
Abs Immature Granulocytes: 0.03 10*3/uL (ref 0.00–0.07)
Basophils Absolute: 0 10*3/uL (ref 0.0–0.1)
Basophils Relative: 0 %
Eosinophils Absolute: 0 10*3/uL (ref 0.0–0.5)
Eosinophils Relative: 0 %
HCT: 30.2 % — ABNORMAL LOW (ref 39.0–52.0)
Hemoglobin: 10.3 g/dL — ABNORMAL LOW (ref 13.0–17.0)
Immature Granulocytes: 1 %
Lymphocytes Relative: 6 %
Lymphs Abs: 0.3 10*3/uL — ABNORMAL LOW (ref 0.7–4.0)
MCH: 25.6 pg — ABNORMAL LOW (ref 26.0–34.0)
MCHC: 34.1 g/dL (ref 30.0–36.0)
MCV: 75.1 fL — ABNORMAL LOW (ref 80.0–100.0)
Monocytes Absolute: 0.3 10*3/uL (ref 0.1–1.0)
Monocytes Relative: 5 %
Neutro Abs: 4.6 10*3/uL (ref 1.7–7.7)
Neutrophils Relative %: 88 %
Platelets: 114 10*3/uL — ABNORMAL LOW (ref 150–400)
RBC: 4.02 MIL/uL — ABNORMAL LOW (ref 4.22–5.81)
RDW: 21.2 % — ABNORMAL HIGH (ref 11.5–15.5)
WBC: 5.2 10*3/uL (ref 4.0–10.5)
nRBC: 2.7 % — ABNORMAL HIGH (ref 0.0–0.2)

## 2020-11-29 LAB — URINE CULTURE: Culture: >=100000 — AB

## 2020-11-29 LAB — MAGNESIUM: Magnesium: 2 mg/dL (ref 1.7–2.4)

## 2020-11-29 LAB — GLUCOSE, CAPILLARY
Glucose-Capillary: 139 mg/dL — ABNORMAL HIGH (ref 70–99)
Glucose-Capillary: 83 mg/dL (ref 70–99)

## 2020-11-29 LAB — PHOSPHORUS: Phosphorus: 4.1 mg/dL (ref 2.5–4.6)

## 2020-11-29 LAB — MRSA NEXT GEN BY PCR, NASAL: MRSA by PCR Next Gen: NOT DETECTED

## 2020-11-29 MED ORDER — CHLORHEXIDINE GLUCONATE 0.12 % MT SOLN
15.0000 mL | Freq: Two times a day (BID) | OROMUCOSAL | Status: DC
  Administered 2020-11-29 – 2020-12-04 (×10): 15 mL via OROMUCOSAL
  Filled 2020-11-29 (×10): qty 15

## 2020-11-29 MED ORDER — LOPERAMIDE HCL 2 MG PO CAPS
2.0000 mg | ORAL_CAPSULE | Freq: Four times a day (QID) | ORAL | Status: AC
Start: 2020-11-29 — End: 2020-11-30
  Administered 2020-11-29 – 2020-11-30 (×2): 2 mg via ORAL
  Filled 2020-11-29 (×2): qty 1

## 2020-11-29 MED ORDER — POTASSIUM CHLORIDE CRYS ER 20 MEQ PO TBCR
40.0000 meq | EXTENDED_RELEASE_TABLET | Freq: Once | ORAL | Status: AC
  Administered 2020-11-29: 40 meq via ORAL
  Filled 2020-11-29: qty 2

## 2020-11-29 MED ORDER — ORAL CARE MOUTH RINSE
15.0000 mL | Freq: Two times a day (BID) | OROMUCOSAL | Status: DC
  Administered 2020-12-01 – 2020-12-04 (×5): 15 mL via OROMUCOSAL

## 2020-11-29 NOTE — TOC Progression Note (Signed)
Transition of Care [facility]) - Progression Note    Patient Details  Name: Henry Carter MRN: [id number] Date of Birth: 1941-03-02  Transition of Care [facility]) CM/SW [place], [place] Phone Number: 11/29/2020, 1:31 PM  Clinical Narrative:   Patient seen in response to MD consult for APS report.  Spoke with wife, who states she is sole giver for patient.  She has an adult son who is willing to come help if she calls for help, but that is the limit of the assistance.  None of his children are involved.  She is needing help, prefereably in the home via PCA, but possibly LTC as well.  I instructed her to apply for MCD at [facility], letting them know specifically what help she was looking for.  She asked me to write this down for her for her to refer to, which I did.  She plans on taking him home at d/c.  If [facility] services arew ordered at d/c, please include aide and social work. TOC will continue to follow during the course of hospitalization.     Expected Discharge Plan: Home/Self Care Barriers to Discharge: Continued Medical Work up  Expected Discharge Plan and Services Expected Discharge Plan: Home/Self Care   Discharge Planning Services: CM Consult   Living arrangements for the past 2 months: Single Family Home                                       Social Determinants of Health (SDOH) Interventions    Readmission Risk Interventions No flowsheet data found.

## 2020-11-29 NOTE — Progress Notes (Signed)
PROGRESS NOTE    Authur Carter  [id number] DOB: Feb 22, 1941 DOA: 11/27/2020 PCP: [name], MD   Chief Complaint  Patient presents with   Failure To Thrive    Coming from home with wife- House was dirty and in a disarray.  ? bedbugs    Brief Narrative:  Patient 80 year old gentleman history of iron deficiency anemia, BPH, chronic bladder outlet obstruction, COPD, hypertension, GERD, PVD presented to the ED via EMS after he was noted to rolled off the bed onto the floor.  Per epic review it is noted that patient's wife presented to PCPs office stating she needed help and could not take care of patient any longer and needed a letter to get patient placed in that patient had rolled off the bed onto the floor.  PCPs office subsequently called EMS patient brought to the ED.  Patient on presentation noted to have a profound hypothermia with a temperature of 85, generalized weakness, mottled lower extremities and noted to be covered in stool and bedbugs with some bilateral hip wounds.  Patient admitted with severe sepsis and placed empirically on IV antibiotics.  Patient placed on Bair hugger and admitted to the stepdown unit.   Assessment & Plan:   Principal Problem:   Severe sepsis with acute organ dysfunction (HCC) Active Problems:   Hypothermia   Hyperlipidemia   Essential hypertension   PERIPHERAL VASCULAR DISEASE   Benign prostatic hyperplasia   Diarrhea   Iron deficiency anemia due to chronic blood loss   Acute renal failure (ARF) (HCC)   Hyperkalemia   Metabolic acidosis   Chronic indwelling Foley catheter   Acute lower UTI   Transaminitis   Severe protein-calorie malnutrition (HCC)   Bilateral edema of lower extremity   Hyperglycemia   Dehydration   Bilateral hydronephrosis   Right leg DVT (HCC)   Pancytopenia (HCC)  1 severe sepsis with organ dysfunction, POA -Patient presented with criteria/signs of severe sepsis with organ dysfunction with severe  hypothermia with a temperature of 85, noted to be hypotensive on admission with blood pressure of 84/51, lactic acid level elevated at 3.9, and acute renal failure with a creatinine of 2.66, urinalysis was cloudy and milky, with moderate leukocytes, nitrite negative, many bacteria, > 50 WBCs with concerns for UTI.  Patient also noted with profuse diarrhea. -Patient pancultured with blood cultures pending.  Urine cultures pending. -Chest x-ray with no acute infiltrate. -Repeat lactic acid level trending down.   -C. difficile PCR negative.   -GI pathogen panel negative. -Continue IV fluids  -.  Status post IV albumin x1.   -IV vancomycin, IV aztreonam have been discontinued and IV antibiotics narrowed down to IV Rocephin and IV Flagyl.  -Bair hugger.   -PCCM was consulted and assessed the patient and have signed off.   -Supportive care.    2.  Hypothermia -Likely secondary to problem #1. -Patient pancultured with cultures pending.  -TSH of 4.521.  -Hypothermia slowly improving.  -IV antibiotics have been narrowed down to IV Flagyl and IV Rocephin.   -Patient seems to be tolerating IV Rocephin.   -IV Vanco and IV aztreonam have been discontinued.   Engineering geologist.    3.  Hypotension Likely multifactorial secondary to hypovolemia from GI losses from profuse diarrhea, infectious etiology from probable UTI.  -Patient has been pancultured cultures pending.  -Blood pressure improving with IV fluids.   -Continue IV fluids, empiric IV antibiotics  .  -Follow.    4.  Acute renal failure -Likely  secondary to prerenal azotemia secondary to profuse ongoing diarrhea from GI losses and post renal azotemia with bilateral hydronephrosis noted on renal ultrasound. -Renal ultrasound also consistent with medical renal disease. -Creatinine currently at 2.26 from 2.55 from 2.66 on admission. -Patient noted to have a creatinine of 1.29 on 07/19/2019. -.  Urinalysis concerning for UTI with 100 protein.   -Urine sodium 48, urine creatinine 22.66.  -FeNa 4.  -Urine output picking up currently at 1.750 L over the past 24 hours  -Continue Foley catheter which patient will be discharged on due to bilateral hydronephrosis. -ANCA panel pending.   -Strict I's and O's.   -Daily weights.   5.  Diarrhea -Patient with profuse ongoing diarrhea found in his feces on presentation. -Patient noted to be hypothermic, concern for severe sepsis. -C. difficile PCR negative.   -GI pathogen panel negative. -Patient noted to have had a colonoscopy in 2014 which showed a sessile polyp. -Patient given Imodium 4 mg p.o. x1 on 11/28/2020 and currently on scheduled Imodium every 6 hours which we will continue for another 24 hours.   -Flexi-Seal.   -IV fluids.   -Patient seen in consultation by GI who are recommending Imodium as needed.   6.  Dehydration -Likely secondary to poor oral intake, GI losses. -Continue IV fluids.  7.  GERD -PPI.  8.  Hyperkalemia -Likely secondary to acute renal failure. -Now with a potassium of 3.3 this morning. -Resolved.   9.  Metabolic acidosis -Patient with elevated lactic acid level, presenting with profuse watery diarrhea with GI losses, noted to be hypotensive, concern for severe sepsis with urinalysis concerning for UTI. -Patient pancultured. -C. difficile PCR negative.  GI pathogen panel negative.   -Patient placed on scheduled Imodium with decreasing stool volume however still with loose stools.   -Acidosis improving. -Continue bicarb drip.   -Continue empiric IV antibiotics.   -Follow.   10.  Lower extremity edema R > L/acute right lower extremity DVT -Patient with lower extremity edema noted on examination with bilateral lower extremity petechiae noted. -Lower extremity Dopplers with acute right lower extremity DVT.  -Patient with history of IVC filter insertion 06/11/2019 secondary to history of DVT with GI bleed.  -Patient noted to have a prior history of lower  extremity DVT. -Status post IV albumin x1. -Follow.  11.  Transaminitis -Felt likely secondary to shock liver.   -Acute hepatitis panel negative.  -Continue fluid resuscitation.  -??  Abdominal imaging -Palliative care consultation pending. -Repeat labs in the morning.  12.  Anemia/pancytopenia -Pancytopenia improving.   -Could likely be secondary to sepsis versus dilutional effect.   -Patient status post transfusion 2 units packed red blood cells.  -Hemoglobin currently at 10.3 from 6.6 (11/28/2020 ) from 8.6 on admission hemoglobin at 6.6 on 11/28/2020 from 8.6 on admission.  -Patient with no overt bleeding.   -Rectal tube with brown stool.  -Patient with prior history of gastritis, AVM per EGD of 10/10/2012, sessile polyps noted on colonoscopy of 10/10/2012.  -Anemia panel with iron of 43, TIBC of 289, ferritin of 192, folate of 7.3, B12 of 976.   -FOBT pending. -Status post transfusion 2 units packed red blood cells hemoglobin at 10.3 this morning. -Prophylactic dose heparin discontinued. -Continue PP I every 12 hours. -Patient seen in consultation by GI who feel no endoscopic procedures are warranted at this time, recommended supportive treatment, palliative care evaluation. -Follow H&H. -Transfusion threshold hemoglobin < 7.   13.  Bilateral hydronephrosis/BPH/urinary retention -Renal ultrasound with bilateral  hydronephrosis.  -Patient noted to have chronic bladder outlet obstruction and noted to have bilateral hydronephrosis and hydroureter per CT abdomen and pelvis (08/17/2019). -Continue Foley catheter which we will leave in place.   -Continue Proscar.   -Urology consulted and recommending continuation of Foley catheter, empiric IV antibiotics.  -Patient will likely need to be discharged with Foley catheter with outpatient follow-up with urology.  14.  Hyperglycemia -Hemoglobin A1c 6.0.   -Blood glucose on basic metabolic lab work at 61. -Check CBGs every 6 hours for the next  24 hours. -Follow.  15.  UTI -Urine cultures pending.   -IV antibiotics have been narrowed down to IV Rocephin.  16.  Severe protein calorie malnutrition/failure to thrive -Patient noted to be found in bed bugs and in feces. -Dietitian consulted.   -Continue dysphagia 3 diet, Ensure.  -TOC consultation due to concern that APS may need to be involved. -It is noted per PCCM note that patient was previously a patient of a thyroid care from April to September 2021 and is noted that staff reported that notes reflect that daughter did not want him to return home at that time, family refused assist from staff for bath, nursing visits. -Patient with some improvement with appetite as patient noted to have eating about 90% of his breakfast this morning. -Consulted palliative care for goals of care as patient noted to be nonambulatory, with progressive failure to thrive per family for several months who are no longer able to care for patient..  17.  Mechanical fall -Plain films of the pelvis unremarkable. -PT/OT assessed patient and per PT note wife had stated that patient has been bedbound for 7 months with minimal oral intake. -Will likely need long-term placement as per epic notes wife had gone to PCPs office on day of admission stating unable to take care of patient anymore and requesting placement..  18.  Lower extremity petechiae -?  Etiology. -Patient noted to have bedbugs on admission. -ANCA panel pending.  GI pathogen panel negative.   -Follow.  19.  Elevated PSA -Per urology patient history of elevated PSA and has refused biopsy in the past.  CT done 2021 without any overt metastatic disease. -Per urology.  20.  Hypokalemia -Replete.   DVT prophylaxis: SCDs Code Status: Full Family Communication: Updated patient.  No family at bedside. Disposition:   Status is: Inpatient  Remains inpatient appropriate because:Inpatient level of care appropriate due to severity of  illness  Dispo: The patient is from: Home              Anticipated d/c is to: TBD likely needs long-term placement.  Palliative care consultation pending.              Patient currently is not medically stable to d/c.   Difficult to place patient No       Consultants:  PCCM: Dr. Silas Flood 11/27/2020 Urology: Dr. Tresa Moore 11/28/2020 Gastroenterology: Dr. Lyndel Safe 11/28/2020  Procedures: Transfused 2 units packed red blood cells 11/28/2020 Renal ultrasound 11/27/2020 Chest x-ray 11/27/2020 Plain films of the pelvis 11/27/2020 Lower extremity Dopplers 11/28/2020   Antimicrobials:  IV vancomycin 11/27/2020>>>> 11/28/2020 IV aztreonam 11/27/2020>>>>> 11/28/2020 IV Flagyl 11/27/2020>>>> IV Rocephin 11/28/2020   Subjective: Patient sitting up in bed.  Eating his breakfast.  Has eating about 90% of his breakfast.  No chest pain.  No shortness of breath.  No abdominal pain.  Rectal tube with watery stool but decreased volume.  No overt bleeding.  On Retail banker.  Objective: Vitals:  11/29/20 0529 11/29/20 0600 11/29/20 0755 11/29/20 0800  BP:  121/70  113/79  Pulse:  65  74  Resp: '11 10  12  '$ Temp: (S) (!) 94.8 F (34.9 C) (!) 95 F (35 C)  (!) 95.9 F (35.5 C)  TempSrc: Bladder   Bladder  SpO2:  98% 98% 100%  Weight:      Height:        Intake/Output Summary (Last 24 hours) at 11/29/2020 0946 Last data filed at 11/29/2020 0934 Gross per 24 hour  Intake 3998.88 ml  Output 2250 ml  Net 1748.88 ml    Filed Weights   11/27/20 1244 11/27/20 1900  Weight: 65.8 kg 65 kg    Examination:  General exam: Frail.  Cachectic.  On Retail banker. Respiratory system: Lungs clear to auscultation bilaterally.  No wheezes, no crackles, no rhonchi.  Normal respiratory effort.   Cardiovascular system: Regular rate and rhythm no murmurs rubs or gallops.  No JVD.  1-2+ bilateral lower extremity edema R > L. Gastrointestinal system: Abdomen is soft, nontender, nondistended, scaphoid.  Positive bowel  sounds.  No rebound.  No guarding.  Central nervous system: Alert. No focal neurological deficits. Extremities: 1-2+ bilateral lower extremity edema R > L. Skin: Petechiae on feet and lower extremities.  Bilateral hip wounds dried with some scabs noted.   Psychiatry: Judgement and insight appear normal. Mood & affect appropriate.     Data Reviewed: I have personally reviewed following labs and imaging studies  CBC: Recent Labs  Lab 11/27/20 1328 11/28/20 0254 11/28/20 1430 11/29/20 0905  WBC 5.2 2.7*  --  5.2  NEUTROABS 5.0 2.5  --  4.6  HGB 8.6* 6.6* 9.6* 10.3*  HCT 28.2* 20.5* 28.7* 30.2*  MCV 80.3 76.2*  --  75.1*  PLT 161 124*  --  114*     Basic Metabolic Panel: Recent Labs  Lab 11/27/20 1328 11/27/20 1927 11/28/20 0254 11/29/20 0905  NA 132* 133* 137 135  K 5.2* 4.7 4.2 3.3*  CL 104 107 111 101  CO2 14* 15* 16* 21*  GLUCOSE 256* 130* 81 61*  BUN 86* 75* 80* 77*  CREATININE 2.66* 2.51* 2.55* 2.26*  CALCIUM 8.2* 8.0* 8.0* 7.6*  MG  --  2.1 2.2 2.0  PHOS  --  5.7* 5.7* 4.1     GFR: Estimated Creatinine Clearance: 24.4 mL/min (A) (by C-G formula based on SCr of 2.26 mg/dL (H)).  Liver Function Tests: Recent Labs  Lab 11/27/20 1328 11/28/20 0254 11/29/20 0905  AST 64* 99* 150*  ALT 32 34 45*  ALKPHOS 92 75 73  BILITOT 1.1 0.8 0.8  PROT 5.4* 5.4* 5.2*  ALBUMIN 2.6* 3.0* 2.7*     CBG: Recent Labs  Lab 11/27/20 1305 11/27/20 1427  GLUCAP 54* 139*      Recent Results (from the past 240 hour(s))  Blood Culture (routine x 2)     Status: None (Preliminary result)   Collection Time: 11/27/20  1:28 PM   Specimen: BLOOD  Result Value Ref Range Status   Specimen Description   Final    BLOOD ARM Performed at [facility], [place] [address] [place], [place] [address]    Special Requests   Final    BOTTLES DRAWN AEROBIC AND ANAEROBIC Blood Culture results may not be optimal due to an inadequate volume of blood received in culture  bottles Performed at [place] [address] [place],  [address]    Culture  Final    NO GROWTH 2 DAYS Performed at [facility], [place] [address] [place], [place] [address]    Report Status PENDING  Incomplete  Blood Culture (routine x 2)     Status: Abnormal (Preliminary result)   Collection Time: 11/27/20  1:44 PM   Specimen: BLOOD  Result Value Ref Range Status   Specimen Description   Final    BLOOD ARM LEFT Performed at [place] [address], [place], [place] [address]    Special Requests   Final    BOTTLES DRAWN AEROBIC AND ANAEROBIC Blood Culture results may not be optimal due to an inadequate volume of blood received in culture bottles Performed at [place] [address], [place], [place] [address]    Culture  Setup Time (A)  Final    GRAM VARIABLE ROD ANAEROBIC BOTTLE ONLY CRITICAL RESULT CALLED TO, READ BACK BY AND VERIFIED WITHGuadlupe Spanish PHARMD [id number] 11/28/20 A BROWNING Performed at [facility], [place] [address] [place], [place] [address]    Culture GRAM VARIABLE ROD (A)  Final   Report Status PENDING  Incomplete  Blood Culture ID Panel (Reflexed)     Status: None   Collection Time: 11/27/20  1:44 PM  Result Value Ref Range Status   Enterococcus faecalis NOT DETECTED NOT DETECTED Final   Enterococcus Faecium NOT DETECTED NOT DETECTED Final   Listeria monocytogenes NOT DETECTED NOT DETECTED Final   Staphylococcus species NOT DETECTED NOT DETECTED Final   Staphylococcus aureus (BCID) NOT DETECTED NOT DETECTED Final   Staphylococcus epidermidis NOT DETECTED NOT DETECTED Final   Staphylococcus lugdunensis NOT DETECTED NOT DETECTED Final   Streptococcus species NOT DETECTED NOT DETECTED Final   Streptococcus agalactiae NOT DETECTED NOT DETECTED Final   Streptococcus pneumoniae NOT DETECTED NOT DETECTED Final   Streptococcus pyogenes NOT DETECTED NOT DETECTED Final    A.calcoaceticus-baumannii NOT DETECTED NOT DETECTED Final   Bacteroides fragilis NOT DETECTED NOT DETECTED Final   Enterobacterales NOT DETECTED NOT DETECTED Final   Enterobacter cloacae complex NOT DETECTED NOT DETECTED Final   Escherichia coli NOT DETECTED NOT DETECTED Final   Klebsiella aerogenes NOT DETECTED NOT DETECTED Final   Klebsiella oxytoca NOT DETECTED NOT DETECTED Final   Klebsiella pneumoniae NOT DETECTED NOT DETECTED Final   Proteus species NOT DETECTED NOT DETECTED Final   Salmonella species NOT DETECTED NOT DETECTED Final   Serratia marcescens NOT DETECTED NOT DETECTED Final   Haemophilus influenzae NOT DETECTED NOT DETECTED Final   Neisseria meningitidis NOT DETECTED NOT DETECTED Final   Pseudomonas aeruginosa NOT DETECTED NOT DETECTED Final   Stenotrophomonas maltophilia NOT DETECTED NOT DETECTED Final   Candida albicans NOT DETECTED NOT DETECTED Final   Candida auris NOT DETECTED NOT DETECTED Final   Candida glabrata NOT DETECTED NOT DETECTED Final   Candida krusei NOT DETECTED NOT DETECTED Final   Candida parapsilosis NOT DETECTED NOT DETECTED Final   Candida tropicalis NOT DETECTED NOT DETECTED Final   Cryptococcus neoformans/gattii NOT DETECTED NOT DETECTED Final    Comment: Performed at [facility], [place]. [address] [place], [place] [address]  Resp Panel by RT-PCR (Flu A&B, Covid) Nasopharyngeal Swab     Status: None   Collection Time: 11/27/20  1:46 PM   Specimen: Nasopharyngeal Swab; Nasopharyngeal(NP) swabs in vial transport medium  Result Value Ref Range Status   SARS Coronavirus 2 by RT PCR NEGATIVE NEGATIVE Final    Comment: (NOTE) SARS-CoV-2 target  nucleic acids are NOT DETECTED.  The SARS-CoV-2 RNA is generally detectable in upper respiratory specimens during the acute phase of infection. The lowest concentration of SARS-CoV-2 viral copies this assay can detect is 138 copies/mL. A negative result does not preclude SARS-Cov-2 infection and  should not be used as the sole basis for treatment or other patient management decisions. A negative result may occur with  improper specimen collection/handling, submission of specimen other than nasopharyngeal swab, presence of viral mutation(s) within the areas targeted by this assay, and inadequate number of viral copies(<138 copies/mL). A negative result must be combined with clinical observations, patient history, and epidemiological information. The expected result is Negative.  Fact Sheet for Patients:  EntrepreneurPulse.com.au  Fact Sheet for Healthcare Providers:  IncredibleEmployment.be  This test is no t yet approved or cleared by the [place] FDA and  has been authorized for detection and/or diagnosis of SARS-CoV-2 by FDA under an Emergency Use Authorization (EUA). This EUA will remain  in effect (meaning this test can be used) for the duration of the COVID-19 declaration under Section 564(b)(1) of the Act, 21 U.S.C.section 360bbb-3(b)(1), unless the authorization is terminated  or revoked sooner.       Influenza A by PCR NEGATIVE NEGATIVE Final   Influenza B by PCR NEGATIVE NEGATIVE Final    Comment: (NOTE) The Xpert Xpress SARS-CoV-2/FLU/RSV plus assay is intended as an aid in the diagnosis of influenza from Nasopharyngeal swab specimens and should not be used as a sole basis for treatment. Nasal washings and aspirates are unacceptable for Xpert Xpress SARS-CoV-2/FLU/RSV testing.  Fact Sheet for Patients: EntrepreneurPulse.com.au  Fact Sheet for Healthcare Providers: IncredibleEmployment.be  This test is not yet approved or cleared by the [place] FDA and has been authorized for detection and/or diagnosis of SARS-CoV-2 by FDA under an Emergency Use Authorization (EUA). This EUA will remain in effect (meaning this test can be used) for the duration of the COVID-19 declaration  under Section 564(b)(1) of the Act, 21 U.S.C. section 360bbb-3(b)(1), unless the authorization is terminated or revoked.  Performed at [facility], [place] [address], [place], [place] [address]   Urine Culture     Status: None (Preliminary result)   Collection Time: 11/27/20  1:48 PM   Specimen: Urine, Catheterized  Result Value Ref Range Status   Specimen Description   Final    URINE, CATHETERIZED Performed at [place] [address] [place], [place] [address]    Special Requests   Final    NONE Performed at [facility], [place] [address] [place], [place] [address]    Culture   Final    CULTURE REINCUBATED FOR BETTER GROWTH Performed at [facility], [place] [address], [place], [place] [address]    Report Status PENDING  Incomplete  Gastrointestinal Panel by PCR , Stool     Status: None   Collection Time: 11/27/20  7:31 PM   Specimen: Stool  Result Value Ref Range Status   Campylobacter species NOT DETECTED NOT DETECTED Final   Plesimonas shigelloides NOT DETECTED NOT DETECTED Final   Salmonella species NOT DETECTED NOT DETECTED Final   Yersinia enterocolitica NOT DETECTED NOT DETECTED Final   Vibrio species NOT DETECTED NOT DETECTED Final   Vibrio cholerae NOT DETECTED NOT DETECTED Final   Enteroaggregative E coli (EAEC) NOT DETECTED NOT DETECTED Final   Enteropathogenic E coli (EPEC) NOT DETECTED NOT DETECTED Final   Enterotoxigenic E coli (ETEC) NOT DETECTED NOT  DETECTED Final   Shiga like toxin producing E coli (STEC) NOT DETECTED NOT DETECTED Final   Shigella/Enteroinvasive E coli (EIEC) NOT DETECTED NOT DETECTED Final   Cryptosporidium NOT DETECTED NOT DETECTED Final   Cyclospora cayetanensis NOT DETECTED NOT DETECTED Final   Entamoeba histolytica NOT DETECTED NOT DETECTED Final   Giardia lamblia NOT DETECTED NOT DETECTED Final   Adenovirus F40/41 NOT DETECTED NOT DETECTED Final   Astrovirus NOT DETECTED  NOT DETECTED Final   Norovirus GI/GII NOT DETECTED NOT DETECTED Final   Rotavirus A NOT DETECTED NOT DETECTED Final   Sapovirus (I, II, IV, and V) NOT DETECTED NOT DETECTED Final    Comment: Performed at [facility], [address] [place], [place] [address]  C Difficile Quick Screen w PCR reflex     Status: None   Collection Time: 11/27/20  7:32 PM   Specimen: STOOL  Result Value Ref Range Status   C Diff antigen NEGATIVE NEGATIVE Final   C Diff toxin NEGATIVE NEGATIVE Final   C Diff interpretation No C. difficile detected.  Final    Comment: Performed at [facility], [place] [address], [place], [place] [address]  MRSA Next Gen by PCR, Nasal     Status: None   Collection Time: 11/29/20  5:25 AM   Specimen: Nasal Mucosa; Nasal Swab  Result Value Ref Range Status   MRSA by PCR Next Gen NOT DETECTED NOT DETECTED Final    Comment: (NOTE) The GeneXpert MRSA Assay (FDA approved for NASAL specimens only), is one component of a comprehensive MRSA colonization surveillance program. It is not intended to diagnose MRSA infection nor to guide or monitor treatment for MRSA infections. Test performance is not FDA approved in patients less than 73 years old. Performed at [facility], [place] [address] [place], [place] [address]           Radiology Studies: US RENAL  Result Date: 11/27/2020 CLINICAL DATA:  Acute renal failure. EXAM: RENAL / URINARY TRACT ULTRASOUND COMPLETE COMPARISON:  June 29, 2019 FINDINGS: Right Kidney: Renal measurements: 9.5 cm x 4.0 cm x 4.4 cm = volume: 87.0 mL. Diffusely increased echogenicity of the renal parenchyma is seen. 1.6 cm x 1.5 cm x 1.5 cm and 1.0 cm x 1.0 cm x 0.8 cm anechoic structures are seen within the right kidney. No abnormal flow is noted within these areas on color Doppler evaluation. There is moderate severity right-sided hydronephrosis. Left Kidney: Renal measurements: 9.1 cm x 4.3 cm x 3.6 cm =  volume: 73.7 mL. Diffusely increased echogenicity of the renal parenchyma is seen. 2.5 cm x 2.0 cm x 2.0 cm and 3.4 cm x 2.2 cm x 2.3 cm anechoic structures are seen within the left kidney. No abnormal flow is noted within these areas on color Doppler evaluation. A 1.1 cm shadowing echogenic focus is seen within the left kidney. There is moderate severity left-sided hydronephrosis. Bladder: A Foley catheter is present within the urinary bladder. Other: The study is limited secondary to the patient's body habitus and inability to take deep inspiration. IMPRESSION: 1. Bilateral echogenic kidneys likely secondary to medical renal disease. 2. Moderate severity bilateral hydronephrosis. 3. 1.1 cm nonobstructing left renal calculus. 4. Bilateral simple renal cysts. Electronically Signed   By: Virgina Norfolk M.D.   On: 11/27/2020 20:04   DG Pelvis Portable  Result Date: 11/27/2020 CLINICAL DATA:  [place] EXAM: PORTABLE PELVIS 1-2 VIEWS COMPARISON:  CT 08/17/2019 FINDINGS: There is no evidence of  pelvic fracture or diastasis. No pelvic bone lesions are seen. Extensive aortoiliac atheromatous calcifications. Retrievable IVC filter. Spondylitic changes in the visualized lower lumbar spine. IMPRESSION: No acute findings. Electronically Signed   By: Lucrezia Europe M.D.   On: 11/27/2020 15:22   DG Chest Port 1 View  Result Date: 11/27/2020 CLINICAL DATA:  Fall EXAM: PORTABLE CHEST 1 VIEW COMPARISON:  Chest radiograph 06/09/2019 FINDINGS: The cardiomediastinal silhouette is within normal limits. The lungs are clear, with no focal consolidation or pulmonary edema. There is no pleural effusion or pneumothorax. There is no acute osseous abnormality. IMPRESSION: No radiographic evidence of acute cardiopulmonary process. Electronically Signed   By: Valetta Mole M.D.   On: 11/27/2020 15:21   VAS [place] LOWER EXTREMITY VENOUS (DVT)  Result Date: 11/28/2020  Lower Venous DVT Study Patient Name:  OLAF DEILY  Date of Exam:    11/28/2020 Medical Rec #: [id number]         Accession #:    [id number] Date of Birth: 30-Oct-1940         Patient Gender: M Patient Age:   65 years Exam Location:  [facility] [place] Procedure:      VAS [place] LOWER EXTREMITY VENOUS (DVT) Referring Phys: Irine Seal --------------------------------------------------------------------------------  Indications: Edema.  Risk Factors: Immobility DVT LLE - 2021 infrarenal IVC filter present. Limitations: Body habitus and calcific shadowing, poor patient positioning. Comparison Study: Previous exam to LLE 05/11/19 Positive DVT (extensive) Performing Technologist: Rogelia Rohrer RVT, RDMS  Examination Guidelines: A complete evaluation includes B-mode imaging, spectral Doppler, color Doppler, and power Doppler as needed of all accessible portions of each vessel. Bilateral testing is considered an integral part of a complete examination. Limited examinations for reoccurring indications may be performed as noted. The reflux portion of the exam is performed with the patient in reverse Trendelenburg.  +---------+---------------+---------+-----------+----------+-----------------+ RIGHT    CompressibilityPhasicitySpontaneityPropertiesThrombus Aging    +---------+---------------+---------+-----------+----------+-----------------+ CFV      None           No       No                   Age Indeterminate +---------+---------------+---------+-----------+----------+-----------------+ SFJ      None                                         Age Indeterminate +---------+---------------+---------+-----------+----------+-----------------+ FV Prox  None           No       No                   Acute             +---------+---------------+---------+-----------+----------+-----------------+ FV Mid   Partial        No       No                   Acute             +---------+---------------+---------+-----------+----------+-----------------+ FV DistalNone           No        No                   Acute             +---------+---------------+---------+-----------+----------+-----------------+ PFV      None           No  No                   Acute             +---------+---------------+---------+-----------+----------+-----------------+ POP      None           No       No                   Age Indeterminate +---------+---------------+---------+-----------+----------+-----------------+ PTV      None           No       No                   Acute             +---------+---------------+---------+-----------+----------+-----------------+ PERO     None           No       No                   Acute             +---------+---------------+---------+-----------+----------+-----------------+ GSV      None           No       No                   Acute             +---------+---------------+---------+-----------+----------+-----------------+ SSV      None           No       No                   Age Indeterminate +---------+---------------+---------+-----------+----------+-----------------+   +---------+---------------+---------+-----------+----------+--------------+ LEFT     CompressibilityPhasicitySpontaneityPropertiesThrombus Aging +---------+---------------+---------+-----------+----------+--------------+ CFV      Full           Yes      Yes                                 +---------+---------------+---------+-----------+----------+--------------+ SFJ      Full                                                        +---------+---------------+---------+-----------+----------+--------------+ FV Prox  Full           Yes      Yes                                 +---------+---------------+---------+-----------+----------+--------------+ FV Mid   Full           Yes      Yes                                 +---------+---------------+---------+-----------+----------+--------------+ FV DistalPartial        Yes       Yes                  Chronic        +---------+---------------+---------+-----------+----------+--------------+ PFV      Full                                                        +---------+---------------+---------+-----------+----------+--------------+  POP      Full           Yes      Yes                                 +---------+---------------+---------+-----------+----------+--------------+ PTV      Full                                                        +---------+---------------+---------+-----------+----------+--------------+ PERO     Full                                                        +---------+---------------+---------+-----------+----------+--------------+     Summary: BILATERAL: -No evidence of popliteal cyst, bilaterally. RIGHT: - Findings consistent with acute deep vein thrombosis involving the right proximal profunda vein, right femoral vein, right posterior tibial veins, and right peroneal veins. - Findings consistent with acute superficial vein thrombosis involving the right great saphenous vein. - Findings consistent with age indeterminate deep vein thrombosis involving the right common femoral vein, SF junction, and right popliteal vein. - Findings consistent with age indeterminate superficial vein thrombosis involving the right small saphenous vein.  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 - There is no evidence of superficial venous thrombosis.  *See table(s) above for measurements and observations. Electronically signed by Deitra Mayo MD on 11/28/2020 at 2:33:51 PM.    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 Limitations: Habitus, calcific shadowing.  Comparison Study: CT ABD/PELVIS 08/17/2019 - positive for thrombus in IVC, right                   CIV, and right EIV Performing Technologist: Jody Hill RVT, RDMS  Examination Guidelines: A complete evaluation includes B-mode imaging, spectral Doppler, color Doppler, and power Doppler as needed of all accessible portions of each vessel. Bilateral testing is considered an integral part of a complete examination. Limited examinations for reoccurring indications may be performed as noted.  IVC/Iliac Findings: +----------+------+--------+--------+    IVC    PatentThrombusComments +----------+------+--------+--------+ IVC Prox  patent                 +----------+------+--------+--------+ IVC Mid         chronic          +----------+------+--------+--------+ IVC Distal      chronic          +----------+------+--------+--------+  +-------------------+---------+-----------+---------+-----------+--------------+  CIV        RT-PatentRT-ThrombusLT-PatentLT-Thrombus   Comments    +-------------------+---------+-----------+---------+-----------+--------------+ Common Iliac Prox             chronic   patent                            +-------------------+---------+-----------+---------+-----------+--------------+ Common Iliac Mid              chronic   patent                            +-------------------+---------+-----------+---------+-----------+--------------+ Common Iliac Distal                                        Not visualized +-------------------+---------+-----------+---------+-----------+--------------+  Unable to compress LT CIV due to overlying AAA +-------------------------+---------+-----------+---------+-----------+--------+            EIV           RT-PatentRT-ThrombusLT-PatentLT-ThrombusComments +-------------------------+---------+-----------+---------+-----------+--------+ External Iliac Vein Prox            chronic   patent                      +-------------------------+---------+-----------+---------+-----------+--------+ External Iliac Vein Mid             chronic   patent                      +-------------------------+---------+-----------+---------+-----------+--------+ External Iliac Vein                 chronic   patent                      Distal                                                                    +-------------------------+---------+-----------+---------+-----------+--------+   Summary: IVC/Iliac: 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 There is chronic thrombus in the right common and external iliac veins. The left common and external iliac veins are patent.  *See table(s) above for measurements and observations.  Electronically signed by Deitra Mayo MD on 11/28/2020 at 2:37:45 PM.    Final         Scheduled Meds:  chlorhexidine  15 mL Mouth Rinse BID   Chlorhexidine Gluconate Cloth  6 each Topical Daily   collagenase   Topical Daily   feeding supplement  1 Container Oral TID BM   feeding supplement  237 mL Oral Q24H   ferrous sulfate  325 mg Oral Q breakfast   finasteride  5 mg Oral Daily   folic acid  1 mg Oral Daily   loperamide  2 mg Oral Q6H   mouth rinse  15 mL Mouth Rinse q12n4p   multivitamin with minerals  1 tablet Oral Daily   nutrition supplement (JUVEN)  1 packet Oral BID BM   pantoprazole (PROTONIX) IV  40 mg Intravenous Q12H   sodium chloride flush  3 mL Intravenous Q12H   umeclidinium bromide  1 puff Inhalation Daily   Continuous Infusions:  sodium chloride Stopped (11/29/20 0910)    cefTRIAXone (ROCEPHIN)  IV Stopped (11/28/20 1602)   metronidazole 100 mL/hr at 11/29/20 0934    sodium bicarbonate (isotonic) infusion in sterile water 125 mL/hr at 11/29/20 0934     LOS: 2 days    Time spent: 40 minutes    Irine Seal, MD [facility]   To contact the attending provider between 7A-7P or the covering provider during after hours 7P-7A, please log into the web site [email or web address] and access using universal [place] password for that web site. If you do not have the password, please call the hospital operator.  11/29/2020, 9:46 AM

## 2020-11-29 NOTE — Progress Notes (Signed)
Pt is alert and oriented to person and place. Pt does call out for his wife Henry Carter. Pt able to feed himself breakfast with tray set up and supervision.

## 2020-11-29 NOTE — Evaluation (Signed)
Clinical/Bedside Swallow Evaluation Patient Details  Name: Henry Carter MRN: [id number] Date of Birth: 1940-09-06  Today's Date: 11/29/2020 Time: SLP Start Time (ACUTE ONLY): [id number] SLP Stop Time (ACUTE ONLY): 1330 SLP Time Calculation (min) (ACUTE ONLY): 8 min  Past Medical History:  Past Medical History:  Diagnosis Date    iron deficiency 01/18/2013   Angioedema    BPH (benign prostatic hyperplasia)    COPD (chronic obstructive pulmonary disease) ([place])    STates he has copd   Essential hypertension    GERD (gastroesophageal reflux disease)    Helicobacter pylori gastritis 10/10/2012   Hyperlipidemia    IBS (irritable bowel syndrome)    Impotence    Peripheral vascular disease, unspecified ([place])    Personal history of colonic adenoma 09/22/2012   Personal history of tobacco use, presenting hazards to health    Past Surgical History:  Past Surgical History:  Procedure Laterality Date   abdominal surg for ulcers'  1980   COLONOSCOPY N/A 09/22/2012   Procedure: COLONOSCOPY;  Surgeon: Gatha Mayer, MD;  Location: [facility];  Service: Endoscopy;  Laterality: N/A;   distal aortogram     IVC FILTER INSERTION N/A 06/11/2019   Procedure: IVC FILTER INSERTION;  Surgeon: Waynetta Sandy, MD;  Location: [place] [facility];  Service: Cardiovascular;  Laterality: N/A;   IVC VENOGRAPHY N/A 06/11/2019   Procedure: IVC Venography;  Surgeon: Waynetta Sandy, MD;  Location: [place] [facility];  Service: Cardiovascular;  Laterality: N/A;   percutaneous transluminal angionplasty with placement of 2 self expanding stent in the distal left superfical femeral artery     HPI:  Patient 80 year old gentleman history of iron deficiency anemia, BPH, chronic bladder outlet obstruction, COPD, hypertension, GERD, PVD presented to the ED via EMS after he was noted to rolled off the bed onto the floor.  Per epic review it is noted that patient's wife presented to PCPs office stating she  needed help and could not take care of patient any longer and needed a letter to get patient placed in that patient had rolled off the bed onto the floor.  PCPs office subsequently called EMS patient brought to the ED.  Patient on presentation noted to have a profound hypothermia with a temperature of 85, generalized weakness, mottled lower extremities and noted to be covered in stool and bedbugs with some bilateral hip wounds.  Patient admitted with severe sepsis and placed empirically on IV antibiotics.  Patient placed on Bair hugger and admitted to the stepdown unit.   Assessment / Plan / Recommendation Clinical Impression  Pt demonstrates a mild oral dysphagia, suspected to be due to edentulous state with no desire to wear dentures. Per pt's wife, pt has not had anything to eat in 6-8 months. He typically drinks water or juice daily, but will not drink Ensure or Boost. When asked if he had a good appetite he shook his head no. Wife tries to get him to eat or drink supplements at home, but he won't. He followed directions for oral mech exam with generalized weakness noted, but no asymmetry. He is edentulous with mild xerostomia noted (likely r/t dehydration). He had no oral residue from breakfast meal. RN reports he had french toast and ate his entire meal without difficulty. He did decline placing dentures for that meal and again declined for SLP for this evaluation. He had no s/s aspiration with any consistency, but was noted with mildly slowed oral phase when consuming soft solids. Hard solids (graham  crackers) were deferred due to edentulous state.   Recommend: thin liquids, dysphagia 3 solids, meds as tolerated, total assist for feeding    SLP Visit Diagnosis: Dysphagia, oral phase (R13.11);Cognitive communication deficit (R41.841)    Aspiration Risk  Mild aspiration risk    Diet Recommendation Dysphagia 3 (Mech soft);Thin liquid   Liquid Administration via: Straw Medication Administration:  Crushed with puree Supervision: Staff to assist with self feeding Postural Changes: Seated upright at 90 degrees    Other  Recommendations Oral Care Recommendations: Oral care BID   Follow up Recommendations Skilled Nursing facility      Frequency and Duration   N/a         Prognosis   Guarded     Swallow Study   General Date of Onset: 11/28/20 HPI: Patient 80 year old gentleman history of iron deficiency anemia, BPH, chronic bladder outlet obstruction, COPD, hypertension, GERD, PVD presented to the ED via EMS after he was noted to rolled off the bed onto the floor.  Per epic review it is noted that patient's wife presented to PCPs office stating she needed help and could not take care of patient any longer and needed a letter to get patient placed in that patient had rolled off the bed onto the floor.  PCPs office subsequently called EMS patient brought to the ED.  Patient on presentation noted to have a profound hypothermia with a temperature of 85, generalized weakness, mottled lower extremities and noted to be covered in stool and bedbugs with some bilateral hip wounds.  Patient admitted with severe sepsis and placed empirically on IV antibiotics.  Patient placed on Bair hugger and admitted to the stepdown unit. Previous Swallow Assessment: none Diet Prior to this Study: Dysphagia 3 (soft);Thin liquids Temperature Spikes Noted: No Respiratory Status: Room air History of Recent Intubation: No Behavior/Cognition: Lethargic/Drowsy;Requires cueing Oral Cavity Assessment: Dry Oral Care Completed by SLP: Recent completion by staff Oral Cavity - Dentition: Edentulous Self-Feeding Abilities: Total assist Patient Positioning: Upright in bed Baseline Vocal Quality: Low vocal intensity Volitional Cough: Cognitively unable to elicit Volitional Swallow: Unable to elicit    Oral/Motor/Sensory Function Overall Oral Motor/Sensory Function: Within functional limits   Thin Liquid Thin Liquid:  Within functional limits Presentation: Straw;Cup;Spoon    Puree Puree: Within functional limits Presentation: Spoon   Solid     Solid: Within functional limits Other Comments: soft solids     Latacha Texeira P. Kynli Chou, M.S., CCC-SLP Speech-Language Pathologist Acute Rehabilitation Services Pager: [place] 11/29/2020,2:42 PM

## 2020-11-29 NOTE — Consult Note (Signed)
Consultation Note Date: 11/29/2020   Patient Name: Henry Carter  DOB: [birth year]  MRN: [id number]  Age / Sex: 80 y.o., male  PCP: [name], MD Referring Physician: Eugenie Filler, MD  Reason for Consultation: Establishing goals of care  HPI/Patient Profile: 79 y.o. male admitted on 11/27/2020   Patient 80 year old gentleman history of iron deficiency anemia, BPH, chronic bladder outlet obstruction, COPD, hypertension, GERD, PVD presented to the ED via EMS after he was noted to rolled off the bed onto the floor.  Per epic review it is noted that patient's wife presented to PCPs office stating she needed help and could not take care of patient any longer and needed a letter to get patient placed in that patient had rolled off the bed onto the floor.  PCPs office subsequently called EMS patient brought to the ED.  Patient on presentation noted to have a profound hypothermia with a temperature of 85, generalized weakness, mottled lower extremities and noted to be covered in stool and bedbugs with some bilateral hip wounds.  Patient admitted with severe sepsis and placed empirically on IV antibiotics.  Patient placed on Bair hugger and admitted to the stepdown unit.   Clinical Assessment and Goals of Care:  Patient remains admitted to hospital medicine service for sepsis with organ dysfunction present on admission, acute renal failure, DVT transaminitis and also found to have severe protein calorie malnutrition, bilateral hydronephrosis.   A palliative consult has been requested for goals of care discussions.   Chart reviewed and I also met with the patient's wife outside his room after discussing with his bedside RN. I introduced myself and palliative care as follows: Palliative medicine is specialized medical care for people living with serious illness. It focuses on providing relief from the  symptoms and stress of a serious illness. The goal is to improve quality of life for both the patient and the family. Goals of care: Broad aims of medical therapy in relation to the patient's values and preferences. Our aim is to provide medical care aimed at enabling patients to achieve the goals that matter most to them, given the circumstances of their particular medical situation and their constraints.   Brief life review performed: patient and his wife Henry Carter have been married since 1999, they have adult children from previous relationships, they live in [place]. 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 Hassan Carter states that at some point, they completed advance care planning documents, she believes patient would not want to be on mechanical support at end of life but she is not sure and will need to find and review those papers again. She states that the patient has always said that he wants to die at home, he did not want to go to SNF. He has had ongoing functional decline at home.   I discussed frankly but compassionately with her about the patient's various conditions as mentioned above and that he has a high risk of ongoing decline, decompensation  and death. I suspect he is hospice eligible, we explored various levels of hospice support, see additional discussions below.   NEXT OF KIN  Wife, they have been married since 1999. They both have adult children from previous relationships, however, wife is primary caregiver for the patient.   SUMMARY OF RECOMMENDATIONS    1. Code status discussions are ongoing, patient's wife believes that they prepared living wills and advance care planning documents in the past. She will try to find and review those documents. We spent some time discussing differences between full code and DNR DNI.  2. Continue current mode of care, TOC note reviewed. I spoke with wife about his current condition and high risk for  ongoing decline. As such, we talked about hospice philosophy of care, specifically home with hospice support discussions were initiated today.   Thank you for the consult.   Code Status/Advance Care Planning: Full code   Symptom Management:     Palliative Prophylaxis:  Bowel Regimen  Additional Recommendations (Limitations, Scope, Preferences): Full Scope Treatment  Psycho-social/Spiritual:  Desire for further Chaplaincy support:yes Additional Recommendations: Caregiving  Support/Resources  Prognosis:  Unable to determine  Discharge Planning: To Be Determined      Primary Diagnoses: Present on Admission:  Severe sepsis with acute organ dysfunction ([place])  Hyperlipidemia  Essential hypertension  PERIPHERAL VASCULAR DISEASE  Iron deficiency anemia due to chronic blood loss  Acute renal failure (ARF) (HCC)  Hyperkalemia  Metabolic acidosis  Acute lower UTI  Transaminitis  Severe protein-calorie malnutrition (HCC)  Bilateral edema of lower extremity  Hyperglycemia  Benign prostatic hyperplasia  Dehydration  Hypothermia  Bilateral hydronephrosis  Right leg DVT ([place])   I have reviewed the medical record, interviewed the patient and family, and examined the patient. The following aspects are pertinent.  Past Medical History:  Diagnosis Date    iron deficiency 01/18/2013   Angioedema    BPH (benign prostatic hyperplasia)    COPD (chronic obstructive pulmonary disease) (HCC)    STates he has copd   Essential hypertension    GERD (gastroesophageal reflux disease)    Helicobacter pylori gastritis 10/10/2012   Hyperlipidemia    IBS (irritable bowel syndrome)    Impotence    Peripheral vascular disease, unspecified (HCC)    Personal history of colonic adenoma 09/22/2012   Personal history of tobacco use, presenting hazards to health    Social History   Socioeconomic History   Marital status: Married    Spouse name: Not on file   Number of children: 4    Years of education: Not on file   Highest education level: Not on file  Occupational History   Not on file  Tobacco Use   Smoking status: Former    Packs/day: 0.25    Years: 50.00    Pack years: 12.50    Types: Cigarettes    Start date: 04/12/1957    Quit date: 04/12/2014    Years since quitting: 6.6   Smokeless tobacco: Never   Tobacco comments:    using chatix; stopped now; took x 1 week / had stopped 3 years prior to starting back in 2005  Vaping Use   Vaping Use: Never used  Substance and Sexual Activity   Alcohol use: No    Alcohol/week: 0.0 standard drinks    Comment: OCC   Drug use: Yes    Comment: States marijuanna relaxes him;    Sexual activity: Not Currently    Comment: marijuanna  Other Topics Concern  Not on file  Social History Narrative   Former smoker-quit fall of 2010   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)   Alcohol use- occasionally   No illict drugs   Social Determinants of Radio broadcast assistant Strain: Not on file  Food Insecurity: Not on file  Transportation Needs: Not on file  Physical Activity: Not on file  Stress: Not on file  Social Connections: Not on file   Family History  Problem Relation Age of Onset   Alcohol abuse Father    Kidney disease Brother    Drug abuse Brother    Diabetes Brother    Diabetes Mother    Diabetes Brother    Colon cancer Neg Hx    Scheduled Meds:  chlorhexidine  15 mL Mouth Rinse BID   Chlorhexidine Gluconate Cloth  6 each Topical Daily   collagenase   Topical Daily   feeding supplement  1 Container Oral TID BM   feeding supplement  237 mL Oral Q24H   ferrous sulfate  325 mg Oral Q breakfast   finasteride  5 mg Oral Daily   folic acid  1 mg Oral Daily   loperamide  2 mg Oral Q6H   mouth rinse  15 mL Mouth Rinse q12n4p   multivitamin with minerals  1 tablet Oral Daily   nutrition supplement (JUVEN)  1 packet Oral BID BM   pantoprazole (PROTONIX) IV  40 mg Intravenous Q12H   sodium chloride  flush  3 mL Intravenous Q12H   umeclidinium bromide  1 puff Inhalation Daily   Continuous Infusions:  sodium chloride 10 mL/hr at 11/29/20 1200   cefTRIAXone (ROCEPHIN)  IV 2 g (11/29/20 1354)   metronidazole Stopped (11/29/20 1010)    sodium bicarbonate (isotonic) infusion in sterile water 100 mL/hr at 11/29/20 1200   PRN Meds:.sodium chloride, acetaminophen **OR** acetaminophen, albuterol, ipratropium, metoprolol tartrate, ondansetron **OR** ondansetron (ZOFRAN) IV, senna-docusate, sorbitol Medications Prior to Admission:  Prior to Admission medications   Medication Sig Start Date End Date Taking? Authorizing Provider  ciprofloxacin (CIPRO) 250 MG tablet Take 1 tablet (250 mg total) by mouth every 12 (twelve) hours. Patient not taking: Reported on 11/27/2020 08/17/19   Virgel Manifold, MD  ferrous sulfate 325 (65 FE) MG tablet Take 325 mg by mouth daily with breakfast.    [provider]  finasteride (PROSCAR) 5 MG tablet Take 5 mg by mouth daily.    [provider]  simvastatin (ZOCOR) 20 MG tablet Take 20 mg by mouth daily.    [provider]  traMADol (ULTRAM) 50 MG tablet TAKE 1 TABLET (50 MG TOTAL) BY MOUTH EVERY 6 (SIX) HOURS AS NEEDED. Patient taking differently: Take 50 mg by mouth every 6 (six) hours as needed for moderate pain. 11/08/19   Biagio Borg, MD   Allergies  Allergen Reactions   Ace Inhibitors     REACTION: angioedema   Penicillins Hives    Has patient had a PCN reaction causing immediate rash, facial/tongue/throat swelling, SOB or lightheadedness with hypotension: No Has patient had a PCN reaction causing severe rash involving mucus membranes or skin necrosis: No Has patient had a PCN reaction that required hospitalization yes Has patient had a PCN reaction occurring within the last 10 years: no If all of the above answers are "NO", then may proceed with Cephalosporin use.   Review of Systems  Physical Exam Patient is noted to be a  frail cachectic appearing gentleman No distress Appears to have  generalized LE edema Abdomen does not appear distended Monitor noted Chart reviewed and discussed with bedside RN: patient with bilateral hip wounds and petechiae LE Able to follow commands, ate well.   Vital Signs: BP (!) 87/65 (BP Location: Right Arm)   Pulse 74   Temp (!) 97 F (36.1 C) (Bladder)   Resp 11   Ht _0  (1.803 m)   Wt 65 kg   SpO2 98%   BMI 19.99 kg/m  Pain Scale: 0-10   Pain Score: 0-No pain   SpO2: SpO2: 98 % O2 Device:SpO2: 98 % O2 Flow Rate: .O2 Flow Rate (L/min): 2 L/min  IO: Intake/output summary:  Intake/Output Summary (Last 24 hours) at 11/29/2020 1502 Last data filed at 11/29/2020 1200 Gross per 24 hour  Intake 3555.98 ml  Output 2125 ml  Net 1430.98 ml    LBM: Last BM Date: 11/29/20 Baseline Weight: Weight: 65.8 kg Most recent weight: Weight: 65 kg     Palliative Assessment/Data:   PPS 30%  Time In:  1400 Time Out:  1500 Time Total:  60  Greater than 50%  of this time was spent counseling and coordinating care related to the above assessment and plan.  Signed by: Loistine Chance, MD   Please contact Palliative Medicine Team phone at [phone] for questions and concerns.  For individual provider: See Shea Evans

## 2020-11-29 NOTE — Evaluation (Signed)
Speech Language Pathology Evaluation Patient Details Name: Rayansh Boise MRN: [id number] DOB: February 19, 1941 Today's Date: 11/29/2020 Time: [id number]  Problem List:  Patient Active Problem List   Diagnosis Date Noted   Right leg DVT ([place]) 11/29/2020   Pancytopenia ([place]) 11/29/2020   Bilateral hydronephrosis 11/28/2020   Severe sepsis with acute organ dysfunction ([place]) 11/27/2020   Acute lower UTI 11/27/2020   Transaminitis 11/27/2020   Severe protein-calorie malnutrition ([place]) 11/27/2020   Bilateral edema of lower extremity 11/27/2020   Hyperglycemia 11/27/2020   Dehydration 11/27/2020   Hypothermia 11/27/2020   Bladder outlet obstruction 08/17/2019   Abdominal pain 08/17/2019   Chronic indwelling Foley catheter 06/29/2019   History of DVT (deep vein thrombosis) 06/29/2019   Obstruction of Foley catheter ([place]) 06/29/2019   Acute bilateral obstructive uropathy    Hypokalemia    Acute metabolic encephalopathy    Hyponatremia    Hematuria 06/09/2019   Acute renal failure (ARF) ([place]) 06/09/2019   Acute blood loss anemia 06/09/2019   Hyperkalemia 99991111   Metabolic acidosis 99991111   Left leg DVT ([place]) 05/18/2019   Left leg swelling 05/11/2019   Pre-operative cardiovascular examination 03/22/2019   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[place]) 07/11/2018   Right arm weakness 07/21/2017   Non compliance w medication regimen 07/30/2016   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[place]) 12/11/2015   Mild cognitive impairment 12/11/2015   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   Healthcare maintenance 02/07/2013   Iron deficiency anemia due to chronic blood loss 01/18/2013   Diarrhea 09/22/2012   C O P D 04/08/2010   Atherosclerosis of native artery of extremity with intermittent claudication ([place]) 11/26/2008   Benign prostatic hyperplasia 10/20/2007   PERIPHERAL VASCULAR DISEASE 05/19/2007   Essential hypertension 02/01/2007   Hyperlipidemia 01/03/2007   Past Medical History:  Past  Medical History:  Diagnosis Date    iron deficiency 01/18/2013   Angioedema    BPH (benign prostatic hyperplasia)    COPD (chronic obstructive pulmonary disease) ([place])    STates he has copd   Essential hypertension    GERD (gastroesophageal reflux disease)    Helicobacter pylori gastritis 10/10/2012   Hyperlipidemia    IBS (irritable bowel syndrome)    Impotence    Peripheral vascular disease, unspecified ([place])    Personal history of colonic adenoma 09/22/2012   Personal history of tobacco use, presenting hazards to health    Past Surgical History:  Past Surgical History:  Procedure Laterality Date   abdominal surg for ulcers'  1980   COLONOSCOPY N/A 09/22/2012   Procedure: COLONOSCOPY;  Surgeon: Gatha Mayer, MD;  Location: [facility];  Service: Endoscopy;  Laterality: N/A;   distal aortogram     IVC FILTER INSERTION N/A 06/11/2019   Procedure: IVC FILTER INSERTION;  Surgeon: Waynetta Sandy, MD;  Location: [facility];  Service: Cardiovascular;  Laterality: N/A;   IVC VENOGRAPHY N/A 06/11/2019   Procedure: IVC Venography;  Surgeon: Waynetta Sandy, MD;  Location: [facility];  Service: Cardiovascular;  Laterality: N/A;   percutaneous transluminal angionplasty with placement of 2 self expanding stent in the distal left superfical femeral artery     HPI:  Patient 80 year old gentleman history of iron deficiency anemia, BPH, chronic bladder outlet obstruction, COPD, hypertension, GERD, PVD presented to the ED via EMS after he was noted to rolled off the bed onto the floor.  Per epic review it is noted that patient's wife presented to PCPs office stating she needed help and  could not take care of patient any longer and needed a letter to get patient placed in that patient had rolled off the bed onto the floor.  PCPs office subsequently called EMS patient brought to the ED.  Patient on presentation noted to have a profound hypothermia with a temperature of  85, generalized weakness, mottled lower extremities and noted to be covered in stool and bedbugs with some bilateral hip wounds.  Patient admitted with severe sepsis and placed empirically on IV antibiotics.  Patient placed on Bair hugger and admitted to the stepdown unit.   Assessment / Plan / Recommendation Clinical Impression  Mr. Ryken demonstrates an (at least) mild-moderate cognitive impairment, ongoing. Evaluation was limited due to lethargy and participation. Initial diagnosis of cognitive impairment found in chart to be in 2017. Wife reports his current mental status is consistent with "normal" for the past several months. He is chronically bed bound and is total assist for all cognitive tasks. He is verbal and "usually" makes sense per wife report. He was mumbling upon SLP arrival, but as eval progressed, he became more participatory and had increased intelligibility. Overall intelligibility was at roughly 60%. He kept his eyes closed for majority of evaluation. He is oriented to self and place, but not time or situation. Pt did not know why he was in the hospital, but stated he wanted to go home. He asked about family and said "bring it bring it bring it" when SLP reported lunch tray was coming shortly. He answered basic questions appropriately regarding wants (to put dentures in, for lunch, name and DOB). Memory was difficult to assess, as pt said, "I don't know" or did not answer most questions regarding today's events. Suspect memory deficits are present, especially in current physical state. Given cognitive and communication status being at or near baseline per wife, no further ST is indicated; however, pt is expected to continue to require total assist for all cognitive (and suspect physical--awaiting PT/OT eval) tasks, pt is recommended for SNF care d/t limitations of wife (arthritis, advanced age, financial resources).     SLP Assessment  SLP Recommendation/Assessment: Patient does not  need any further Speech Lanaguage Pathology Services SLP Visit Diagnosis: Dysphagia, oral phase (R13.11);Cognitive communication deficit (R41.841)    Follow Up Recommendations  Skilled Nursing facility    Frequency and Duration   N/a        SLP Evaluation Cognition  Overall Cognitive Status: History of cognitive impairments - at baseline Arousal/Alertness: Lethargic Orientation Level: Oriented to person;Oriented to place;Disoriented to time;Disoriented to situation Attention: Focused Focused Attention: Impaired Memory: Impaired Memory Impairment: Storage deficit;Retrieval deficit;Decreased short term memory Awareness: Impaired Awareness Impairment: Intellectual impairment;Anticipatory impairment Problem Solving: Impaired Problem Solving Impairment: Functional basic Behaviors: Verbal agitation Safety/Judgment: Impaired       Comprehension  Auditory Comprehension Overall Auditory Comprehension: Appears within functional limits for tasks assessed    Expression Expression Primary Mode of Expression: Verbal Verbal Expression Overall Verbal Expression: Appears within functional limits for tasks assessed   Oral / Motor  Oral Motor/Sensory Function Overall Oral Motor/Sensory Function: Within functional limits Motor Speech Overall Motor Speech: Appears within functional limits for tasks assessed Articulation: Impaired Intelligibility: Intelligibility reduced Word: 50-74% accurate Phrase: 50-74% accurate                      Jebediah Macrae P. Mignon Bechler, M.S., [place] Pathologist Acute Rehabilitation Services Pager: [place] 11/29/2020, 2:21 PM

## 2020-11-29 NOTE — Progress Notes (Signed)
Glucose resulted on labs at 61, pt eating breakfast and drinking juice. Will recheck. Dr Grandville Silos ordered blood sugar checks q6hrs

## 2020-11-30 ENCOUNTER — Inpatient Hospital Stay (HOSPITAL_COMMUNITY): Payer: Medicare HMO

## 2020-11-30 DIAGNOSIS — N179 Acute kidney failure, unspecified: Secondary | ICD-10-CM | POA: Diagnosis not present

## 2020-11-30 DIAGNOSIS — R531 Weakness: Secondary | ICD-10-CM

## 2020-11-30 DIAGNOSIS — Z7189 Other specified counseling: Secondary | ICD-10-CM | POA: Diagnosis not present

## 2020-11-30 DIAGNOSIS — N401 Enlarged prostate with lower urinary tract symptoms: Secondary | ICD-10-CM | POA: Diagnosis not present

## 2020-11-30 DIAGNOSIS — N390 Urinary tract infection, site not specified: Secondary | ICD-10-CM | POA: Diagnosis not present

## 2020-11-30 DIAGNOSIS — Z515 Encounter for palliative care: Secondary | ICD-10-CM

## 2020-11-30 DIAGNOSIS — A419 Sepsis, unspecified organism: Secondary | ICD-10-CM | POA: Diagnosis not present

## 2020-11-30 LAB — CBC WITH DIFFERENTIAL/PLATELET
Band Neutrophils: 3 %
Basophils Relative: 0 %
Blasts: NONE SEEN %
Eosinophils Relative: 0 %
HCT: 29.7 % — ABNORMAL LOW (ref 39.0–52.0)
Hemoglobin: 9.9 g/dL — ABNORMAL LOW (ref 13.0–17.0)
Lymphocytes Relative: 3 %
MCH: 25.5 pg — ABNORMAL LOW (ref 26.0–34.0)
MCHC: 33.3 g/dL (ref 30.0–36.0)
MCV: 76.5 fL — ABNORMAL LOW (ref 80.0–100.0)
Metamyelocytes Relative: NONE SEEN %
Monocytes Relative: 2 %
Myelocytes: NONE SEEN %
Neutrophils Relative %: 92 %
Platelets: 101 10*3/uL — ABNORMAL LOW (ref 150–400)
Promyelocytes Relative: NONE SEEN %
RBC: 3.88 MIL/uL — ABNORMAL LOW (ref 4.22–5.81)
RDW: 21.7 % — ABNORMAL HIGH (ref 11.5–15.5)
WBC: 5.8 10*3/uL (ref 4.0–10.5)
nRBC: 1.2 % — ABNORMAL HIGH (ref 0.0–0.2)
nRBC: 2 /100 WBC — ABNORMAL HIGH

## 2020-11-30 LAB — COMPREHENSIVE METABOLIC PANEL
ALT: 45 U/L — ABNORMAL HIGH (ref 0–44)
AST: 140 U/L — ABNORMAL HIGH (ref 15–41)
Albumin: 2.6 g/dL — ABNORMAL LOW (ref 3.5–5.0)
Alkaline Phosphatase: 72 U/L (ref 38–126)
Anion gap: 10 (ref 5–15)
BUN: 72 mg/dL — ABNORMAL HIGH (ref 8–23)
CO2: 28 mmol/L (ref 22–32)
Calcium: 7.7 mg/dL — ABNORMAL LOW (ref 8.9–10.3)
Chloride: 99 mmol/L (ref 98–111)
Creatinine, Ser: 2.2 mg/dL — ABNORMAL HIGH (ref 0.61–1.24)
GFR, Estimated: 30 mL/min — ABNORMAL LOW (ref >60)
Glucose, Bld: 70 mg/dL (ref 70–99)
Potassium: 2.8 mmol/L — ABNORMAL LOW (ref 3.5–5.1)
Sodium: 137 mmol/L (ref 135–145)
Total Bilirubin: 0.5 mg/dL (ref 0.3–1.2)
Total Protein: 5 g/dL — ABNORMAL LOW (ref 6.5–8.1)

## 2020-11-30 LAB — GLUCOSE, CAPILLARY
Glucose-Capillary: 75 mg/dL (ref 70–99)
Glucose-Capillary: 82 mg/dL (ref 70–99)
Glucose-Capillary: 128 mg/dL — ABNORMAL HIGH (ref 70–99)

## 2020-11-30 LAB — PHOSPHORUS: Phosphorus: 3.4 mg/dL (ref 2.5–4.6)

## 2020-11-30 LAB — MAGNESIUM: Magnesium: 1.7 mg/dL (ref 1.7–2.4)

## 2020-11-30 MED ORDER — DEXTROSE-NACL 5-0.9 % IV SOLN: INTRAVENOUS | Status: DC

## 2020-11-30 MED ORDER — LOPERAMIDE HCL 2 MG PO CAPS: 2.0000 mg | ORAL_CAPSULE | ORAL | Status: DC | PRN

## 2020-11-30 MED ORDER — POTASSIUM CHLORIDE CRYS ER 20 MEQ PO TBCR
40.0000 meq | EXTENDED_RELEASE_TABLET | ORAL | Status: AC
  Administered 2020-11-30: 40 meq via ORAL
  Filled 2020-11-30: qty 2

## 2020-11-30 MED ORDER — POTASSIUM CHLORIDE CRYS ER 20 MEQ PO TBCR
40.0000 meq | EXTENDED_RELEASE_TABLET | ORAL | Status: DC
Start: 2020-11-30 — End: 2020-11-30
  Administered 2020-11-30: 40 meq via ORAL
  Filled 2020-11-30: qty 2

## 2020-11-30 MED ORDER — VANCOMYCIN HCL 1250 MG/250ML IV SOLN
1250.0000 mg | INTRAVENOUS | Status: DC
  Administered 2020-11-30: 1250 mg via INTRAVENOUS
  Filled 2020-11-30: qty 250

## 2020-11-30 MED ORDER — MAGNESIUM SULFATE 4 GM/100ML IV SOLN
4.0000 g | Freq: Once | INTRAVENOUS | Status: AC
  Administered 2020-11-30: 4 g via INTRAVENOUS
  Filled 2020-11-30: qty 100

## 2020-11-30 NOTE — Progress Notes (Signed)
Daily Progress Note   Patient Name: Henry Carter       Date: 11/30/2020 DOB: March 22, 1941  Age: 80 y.o. MRN#: [id number] Attending Physician: Eugenie Filler, MD Primary Care Physician: [name], MD Admit Date: 11/27/2020  Reason for Consultation/Follow-up: Establishing goals of care  Subjective: Patient is awake alert resting in bed.  His wife arrived at the bedside and a CODE STATUS and goals of care discussion was done and the patient also participated, see below.  Patient repeatedly asks for the bear hugger to be removed and asks to be helped in getting out of bed, asks for something to eat.  Length of Stay: 3  Current Medications: Scheduled Meds:  . chlorhexidine  15 mL Mouth Rinse BID  . Chlorhexidine Gluconate Cloth  6 each Topical Daily  . collagenase   Topical Daily  . feeding supplement  1 Container Oral TID BM  . feeding supplement  237 mL Oral Q24H  . ferrous sulfate  325 mg Oral Q breakfast  . finasteride  5 mg Oral Daily  . folic acid  1 mg Oral Daily  . loperamide  2 mg Oral Q6H  . mouth rinse  15 mL Mouth Rinse q12n4p  . multivitamin with minerals  1 tablet Oral Daily  . nutrition supplement (JUVEN)  1 packet Oral BID BM  . pantoprazole (PROTONIX) IV  40 mg Intravenous Q12H  . sodium chloride flush  3 mL Intravenous Q12H  . umeclidinium bromide  1 puff Inhalation Daily    Continuous Infusions: . sodium chloride Stopped (11/30/20 0847)  . dextrose 5 % and 0.9% NaCl 100 mL/hr at 11/30/20 0900  . vancomycin Stopped (11/30/20 1126)    PRN Meds: sodium chloride, acetaminophen **OR** acetaminophen, albuterol, ipratropium, loperamide, metoprolol tartrate, ondansetron **OR** ondansetron (ZOFRAN) IV, senna-docusate, sorbitol  Physical Exam          Frail and cachectic appearing gentleman Has a bear hugger on Monitor noted Has upper and lower extremity edema left hand swollen Petechiae feet and lower extremities Noted to have bilateral hip wounds Mood and affect within normal limits, displays fairly intact judgment and insight and is able to participate in conversations  Vital Signs: BP 98/75   Pulse 64   Temp (!) 96.3 F (35.7 C)   Resp 10   Ht 5'  11" (1.803 m)   Wt 66 kg   SpO2 95%   BMI 20.29 kg/m  SpO2: SpO2: 95 % O2 Device: O2 Device: Room Air O2 Flow Rate: O2 Flow Rate (L/min): 2 L/min  Intake/output summary:  Intake/Output Summary (Last 24 hours) at 11/30/2020 1452 Last data filed at 11/30/2020 0900 Gross per 24 hour  Intake 2509.85 ml  Output 1050 ml  Net 1459.85 ml   LBM: Last BM Date: 11/30/20 Baseline Weight: Weight: 65.8 kg Most recent weight: Weight: 66 kg      Palliative performance scale 50%. Palliative Assessment/Data:      Patient Active Problem List   Diagnosis Date Noted  . Right leg DVT ([place]) 11/29/2020  . Pancytopenia ([place]) 11/29/2020  . Bilateral hydronephrosis 11/28/2020  . Severe sepsis with acute organ dysfunction ([place]) 11/27/2020  . Acute lower UTI 11/27/2020  . Transaminitis 11/27/2020  . Severe protein-calorie malnutrition ([place]) 11/27/2020  . Bilateral edema of lower extremity 11/27/2020  . Hyperglycemia 11/27/2020  . Dehydration 11/27/2020  . Hypothermia 11/27/2020  . 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 Abdominal pain 08/17/2019  . Chronic indwelling Foley catheter 06/29/2019  . History of DVT (deep vein thrombosis) 06/29/2019  . Obstruction of Foley catheter ([place]) 06/29/2019  . Acute bilateral obstructive uropathy   . Hypokalemia   . Acute metabolic encephalopathy   . Hyponatremia   . Hematuria 06/09/2019  . Acute renal failure (ARF) ([place]) 06/09/2019  . Acute blood loss anemia 06/09/2019  . Hyperkalemia 06/09/2019  . Metabolic acidosis 99991111  . Left leg  DVT ([place]) 05/18/2019  . Left leg swelling 05/11/2019  . Pre-operative cardiovascular examination 03/22/2019  . 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[place]) 07/11/2018  . Right arm weakness 07/21/2017  . Non compliance w medication regimen 07/30/2016  . 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 Mild cognitive impairment 12/11/2015  . 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 Healthcare maintenance 02/07/2013  . Iron deficiency anemia due to chronic blood loss 01/18/2013  . Diarrhea 09/22/2012  . C O P D 04/08/2010  . Atherosclerosis of native artery of extremity with intermittent claudication ([place]) 11/26/2008  . Benign prostatic hyperplasia 10/20/2007  . PERIPHERAL VASCULAR DISEASE 05/19/2007  . Essential hypertension 02/01/2007  . Hyperlipidemia 01/03/2007    Palliative Care Assessment & Plan   Patient Profile:    Assessment: 80 year old gentleman brought from home, lives with wife, admitted with urinary retention, acute renal failure and urosepsis.  Patient remains admitted to hospital medicine service for receiving broad-spectrum antibiotics.  He presented with severe sepsis criteria with organ dysfunction.  Was found to have hypothermia and hypotension and acute renal failure.  Also found to have acute right lower extremity DVT and metabolic acidosis.  Also noted to have bilateral hydronephrosis.  Recommendations/Plan: Goals of care discussions: Discussed with patient and wife at bedside.  Reviewed extensively about scope of current hospitalization.  Goals wishes and values important to the patient and the wife attempted to be explored.  CODE STATUS discussions undertaken.  Disposition options discussed.  We again discussed about appropriateness of adding hospice services towards the end of this hospitalization. Plan: DNR/DNI has been established: Differences between full code versus DNR/DNI were explained in detail.  Described about resuscitation attempts/ACLS protocol.  Patient  states " just let me go" and patient's wife states that it would be very harmful and traumatic for the patient to undergo an aggressive resuscitation attempt and mechanical measures at end-of-life. Continue current mode of care patient and wife  remain hopeful for some degree of stabilization/recovery. TOC consult to determine appropriate disposition options: Home with hospice or SNF rehab with palliative care.  Goals of Care and Additional Recommendations: Limitations on Scope of Treatment:    Code Status:    Code Status Orders  (From admission, onward)           Start     Ordered   11/30/20 1453  Do not attempt resuscitation (DNR)  Continuous       Question Answer Comment  In the event of cardiac or respiratory ARREST Do not call a "code blue"   In the event of cardiac or respiratory ARREST Do not perform Intubation, CPR, defibrillation or ACLS   In the event of cardiac or respiratory ARREST Use medication by any route, position, wound care, and other measures to relive pain and suffering. May use oxygen, suction and manual treatment of airway obstruction as needed for comfort.      11/30/20 1452           Code Status History     Date Active Date Inactive Code Status Order ID Comments User Context   11/27/2020 1829 11/30/2020 1452 Full Code [id number]  Eugenie Filler, MD Inpatient   06/29/2019 1859 07/07/2019 0021 Full Code [id number]  Lenore Cordia, MD ED   06/09/2019 2105 06/15/2019 1941 Full Code [id number]  Shela Leff, MD ED      Advance Directive Documentation    Flowsheet Row Most Recent Value  Type of Advance Directive Healthcare Power of Attorney, Living will  Pre-existing out of facility DNR order (yellow form or pink MOST form) --  "MOST" Form in Place? --       Prognosis:  Unable to determine  Discharge Planning: To Be Determined  Care plan was discussed with  patient and wife.   Thank you for allowing the Palliative Medicine Team to assist in  the care of this patient.   Time In: 1400 Time Out: 1440 Total Time 40 Prolonged Time Billed  no       Greater than 50%  of this time was spent counseling and coordinating care related to the above assessment and plan.  Loistine Chance, MD  Please contact Palliative Medicine Team phone at [phone] for questions and concerns.

## 2020-11-30 NOTE — Progress Notes (Signed)
Pharmacy Antibiotic Note  Henry Carter is a 80 y.o. male admitted on 11/27/2020 with urosepsis.  Pharmacy has been consulted for Vancomycin dosing for Aerococcus UTI and C.perfringens bacteremia. - SCr 2.2 - WBC wnl - hypothermic (95.2)  Plan: Vancomycin 1250 mg IV q48h   Measure Vanc peak and trough at steady state as needed.  Goal AUC = 400 - 550 Follow up renal function, culture results, and clinical course.   Height: '5\' 11"'$  (180.3 cm) Weight: 66 kg (145 lb 8.1 oz) IBW/kg (Calculated) : 75.3  Temp (24hrs), Avg:96.7 F (35.9 C), Min:95.5 F (35.3 C), Max:98.4 F (36.9 C)  Recent Labs  Lab 11/27/20 1328 11/27/20 1706 11/27/20 1927 11/27/20 2352 11/28/20 0254 11/29/20 0905 11/30/20 0303  WBC 5.2  --   --   --  2.7* 5.2 5.8  CREATININE 2.66*  --  2.51*  --  2.55* 2.26* 2.20*  LATICACIDVEN 3.9* >11.0* 2.1* 1.0 1.0  --   --     Estimated Creatinine Clearance: 25.4 mL/min (A) (by C-G formula based on SCr of 2.2 mg/dL (H)).    Allergies  Allergen Reactions   Ace Inhibitors     REACTION: angioedema   Penicillins Hives    Has patient had a PCN reaction causing immediate rash, facial/tongue/throat swelling, SOB or lightheadedness with hypotension: No Has patient had a PCN reaction causing severe rash involving mucus membranes or skin necrosis: No Has patient had a PCN reaction that required hospitalization yes Has patient had a PCN reaction occurring within the last 10 years: no If all of the above answers are "NO", then may proceed with Cephalosporin use.   Antimicrobials this admission: 8/18 vanc>>8/19 8/18 aztreonam>> 8/19 8/18 flagyl>> 8/21 8/19 CTX>>8/21 8/21 Vancomycin >>    Dose adjustments this admission:   Microbiology results: 8/18 UCx: Aerococcus species 8/18 BCx2: 1/4 gram variable rod - Clostridium Perfringens  8/18 CDiff: neg Ag, neg Toxin 8/18 GI Panel: none detected 8/18 respiratory panel: neg covid, neg influenza 8/20 MRSA PCR: not  detected  Thank you for allowing pharmacy to be a part of this patient's care.  Gretta Arab PharmD, BCPS Clinical Pharmacist [facility] main pharmacy [phone] 11/30/2020 8:55 AM

## 2020-11-30 NOTE — Progress Notes (Signed)
Subjective/Chief Complaint:   1 - Urinary Retention - long h/o retention with PVR's >776m, severe bilateral hydro c/w obstruction by [facility] ER 11/2020. Prostate vol 1164mby CT 2021.    2 - Acute Renal Failure - Cr 2.5 on admission 11/2020 up from baselie <1, bialteral hydro c/w bladder level obstruction as epr above. Also very poor PO intake x mos per family.    3 - Urosepsis - lactic acidosis, hypothermia, bacteruria c/w sepsis form likely urinary source. UCX 8/18 aerococus (?contaminant). Placed on empiric Aztreonam.   4 - Elevated PSA - long h/o elevated PSA and has refused biopsy. CT 2021 w/o overt metastatic disease.  2020 - PSA 20 2021 - PSA 30  2022 - PSA 11   5 - End of Life Care - pt non-ambulatory with progressive cachexia x mos per family, they are no longer able to care for him. Palliative care consult 8/20 and family considering home hospice transition.   Today "Henry Carter profoundly ill but mental status improved.  Remains hypothermic despite aggressive warming and supportive care.    Objective: Vital signs in last 24 hours: Temp:  [95.5 F (35.3 C)-98.4 F (36.9 C)] 95.5 F (35.3 C) (08/21 0600) Pulse Rate:  [64-88] 64 (08/21 0600) Resp:  [7-21] 12 (08/21 0600) BP: (79-162)/(55-84) 123/84 (08/21 0600) SpO2:  [9 %-98 %] 97 % (08/21 0400) Weight:  [66 kg] 66 kg (08/21 0500) Last BM Date: 11/30/20  Intake/Output from previous day: 08/20 0701 - 08/21 0700 In: 3459.5 [P.O.:717; I.V.:2544.1; IV Piggyback:198.4] Out: 1675 [Urine:1575; Stool:100]   Physical Exam Vitals reviewed.  Constitutional:      Comments: AO x3. Much more alert today, but still some slowed mentation.  HENT:     Head: Normocephalic.     Nose: Nose normal.     Mouth/Throat:     Mouth: Mucous membranes are moist.  Eyes:     Pupils: Pupils are equal, round, and reactive to light.  Cardiovascular:     Rate and Rhythm: Normal rate.  Pulmonary:     Effort: Pulmonary effort is normal.   Abdominal:     General: Abdomen is flat.     Comments: Scaphoid abdomen.   Genitourinary:    Comments: Uncircumcised foreskin reduced with foley in place with medium yellow urine.  Musculoskeletal:     Comments: Cachectic extremities.   Skin:    General: Skin is warm.  Neurological:     General: No focal deficit present.  Psychiatric:        Mood and Affect: Mood normal.     Lab Results:  Recent Labs    11/29/20 0905 11/30/20 0303  WBC 5.2 5.8  HGB 10.3* 9.9*  HCT 30.2* 29.7*  PLT 114* 101*   BMET Recent Labs    11/29/20 0905 11/30/20 0303  NA 135 137  K 3.3* 2.8*  CL 101 99  CO2 21* 28  GLUCOSE 61* 70  BUN 77* 72*  CREATININE 2.26* 2.20*  CALCIUM 7.6* 7.7*   PT/INR Recent Labs    11/27/20 1328  LABPROT 14.4  INR 1.1    Assessment/Plan:  1 - Urinary Retention - agree with foley, for decompresison and to allow for better hygien as he is too weak to void even into urinal. Do NOT recommend trials of void this admission.    2 - Acute Renal Failure - likely multifactorial with intrinsic renal + pre-renal deydration + prostate level obstruction. Keep current catheter as per above.  3 - Urosepsis - Agree with current ABX pending addditional CX data.    4 - Elevated PSA -  PSA this admission not trending up, less suspicion of clinically significant malignancy. NO biopsy warranted as limited life expectancy.   5 - End of Life Care - Very limited life expectancy, certianly 99991111. Agree with home hospice transition per pt/family goals. Greatly appreciate Dr. Inda Castle input.     Alexis Frock 11/30/2020

## 2020-11-30 NOTE — Progress Notes (Addendum)
PROGRESS NOTE    Henry Carter  [id number] DOB: 10/14/1940 DOA: 11/27/2020 PCP: [name], MD   Chief Complaint  Patient presents with   Failure To Thrive    Coming from home with wife- House was dirty and in a disarray.  ? bedbugs    Brief Narrative:  Patient 80 year old gentleman history of iron deficiency anemia, BPH, chronic bladder outlet obstruction, COPD, hypertension, GERD, PVD presented to the ED via EMS after he was noted to rolled off the bed onto the floor.  Per epic review it is noted that patient's wife presented to PCPs office stating she needed help and could not take care of patient any longer and needed a letter to get patient placed in that patient had rolled off the bed onto the floor.  PCPs office subsequently called EMS patient brought to the ED.  Patient on presentation noted to have a profound hypothermia with a temperature of 85, generalized weakness, mottled lower extremities and noted to be covered in stool and bedbugs with some bilateral hip wounds.  Patient admitted with severe sepsis and placed empirically on IV antibiotics.  Patient placed on Bair hugger and admitted to the stepdown unit.   Assessment & Plan:   Principal Problem:   Severe sepsis with acute organ dysfunction (HCC) Active Problems:   Hypothermia   Hyperlipidemia   Essential hypertension   PERIPHERAL VASCULAR DISEASE   Benign prostatic hyperplasia   Diarrhea   Iron deficiency anemia due to chronic blood loss   Acute renal failure (ARF) (HCC)   Hyperkalemia   Metabolic acidosis   Chronic indwelling Foley catheter   Acute lower UTI   Transaminitis   Severe protein-calorie malnutrition (HCC)   Bilateral edema of lower extremity   Hyperglycemia   Dehydration   Bilateral hydronephrosis   Right leg DVT (HCC)   Pancytopenia (HCC)  1 severe sepsis with organ dysfunction, POA -Patient presented with criteria/signs of severe sepsis with organ dysfunction with severe  hypothermia with a temperature of 85, noted to be hypotensive on admission with blood pressure of 84/51, lactic acid level elevated at 3.9, and acute renal failure with a creatinine of 2.66, urinalysis was cloudy and milky, with moderate leukocytes, nitrite negative, many bacteria, > 50 WBCs with concerns for UTI.  Patient also noted with profuse diarrhea. -Patient pancultured with blood cultures pending.  Urine cultures with > 100000 aerococcus species. -Chest x-ray with no acute infiltrate. -Repeat lactic acid level trending down.   -C. difficile PCR negative.   -GI pathogen panel negative -.  Status post IV albumin x1.  -Discontinue bicarb drip and placed on D5 normal saline. -IV vancomycin, IV aztreonam when narrowed to IV Rocephin and IV Flagyl.   -Urine cultures with Aerococcus species.  Patient with penicillin allergy.   -Discontinue IV Rocephin, IV Flagyl and place empirically on IV vancomycin to complete course of antibiotic treatment.   -Bair hugger. -PCCM was consulted and assessed the patient and have signed off.   -Supportive care.    2.  Hypothermia -Likely secondary to problem #1. -Patient pancultured with cultures pending.  -Urine cultures with Aerococcus species. -TSH of 4.521.  -Hypothermia slowly improving.  -IV antibiotics were narrowed down to IV Flagyl and IV Rocephin.  -Patient noted to have a penicillin allergy and as such IV antibiotics will be changed to IV vancomycin due to urine culture results of Aerococcus species.   Engineering geologist.    3.  Hypotension Likely multifactorial secondary to hypovolemia from GI  losses from profuse diarrhea, infectious etiology from probable UTI.  -Patient has been pancultured cultures pending.  -Blood pressure improving with IV fluids.   -Continue IV fluids, empiric IV antibiotics  .  -Follow.    4.  Acute renal failure -Likely secondary to prerenal azotemia secondary to profuse ongoing diarrhea from GI losses and post renal  azotemia with bilateral hydronephrosis noted on renal ultrasound. -Renal ultrasound also consistent with medical renal disease. -Creatinine currently at 2.20 from 2.26 from 2.55 from 2.66 on admission. -Patient noted to have a creatinine of 1.29 on 07/19/2019. -.  Urinalysis concerning for UTI with 100 protein.  -Urine sodium 48, urine creatinine 22.66.  -FeNa 4.  -Urine output picking up and patient with a urine output of 1.575 L over the past 24 hours.  -Continue Foley catheter which patient will be discharged on due to bilateral hydronephrosis. -ANCA panel pending.   -Discontinue bicarb drip and placed on D5 normal saline. -Strict I's and O's.   -Daily weights.   5.  Diarrhea -Patient with profuse ongoing diarrhea found in his feces on presentation. -Patient noted to be hypothermic, concern for severe sepsis. -C. difficile PCR negative.   -GI pathogen panel negative. -Patient noted to have had a colonoscopy in 2014 which showed a sessile polyp. -Patient diarrhea improved with scheduled Imodium.   -Discontinue scheduled Imodium and place on Imodium as needed. -Patient seen in consultation by GI who are recommending Imodium as needed.   6.  Dehydration -Likely secondary to poor oral intake, GI losses. -Change bicarb drip to D5 normal saline.   -Follow.   7.  GERD -PPI.  8.  Hyperkalemia -Likely secondary to acute renal failure. -Now with hypokalemia.   -Follow.  9.  Metabolic acidosis -Patient with elevated lactic acid level, presenting with profuse watery diarrhea with GI losses, noted to be hypotensive, concern for severe sepsis with urinalysis concerning for UTI. -Patient pancultured. -C. difficile PCR negative.  GI pathogen panel negative.   -Diarrhea improving on scheduled Imodium.   -Acidosis resolved.   -Discontinue bicarb drip.   -Place on D5 normal saline.   -IV antibiotics.   10.  Lower extremity edema R > L/acute right lower extremity DVT -Patient with lower  extremity edema noted on examination with bilateral lower extremity petechiae noted. -Lower extremity Dopplers with acute right lower extremity DVT.  -Patient with history of IVC filter insertion 06/11/2019 secondary to history of DVT with GI bleed.  -Patient noted to have a prior history of lower extremity DVT. -Status post IV albumin x1. -Follow.  11.  Transaminitis -Felt likely secondary to shock liver.   -Acute hepatitis panel negative.  -Check abdominal ultrasound.   -Palliative care consulted and following.  - -Repeat labs in the morning.  12.  Anemia/pancytopenia -Pancytopenia improving.   -Could likely be secondary to sepsis versus dilutional effect.   -Patient status post transfusion 2 units packed red blood cells.  -Hemoglobin currently at 9.9 from 10.3 from 6.6 (11/28/2020 ) from 8.6 on admission hemoglobin at 6.6 on 11/28/2020 from 8.6 on admission.  -Patient with no overt bleeding.   -Rectal tube with brown stool.  -Patient with prior history of gastritis, AVM per EGD of 10/10/2012, sessile polyps noted on colonoscopy of 10/10/2012.  -Anemia panel with iron of 43, TIBC of 289, ferritin of 192, folate of 7.3, B12 of 976.   -FOBT pending. -Prophylactic dose heparin discontinued. -Continue PPI every 12 hours. -Patient seen in consultation by GI who feel no endoscopic procedures  are warranted at this time, recommended supportive treatment, palliative care evaluation. -Follow H&H. -Transfusion threshold hemoglobin < 7.   13.  Bilateral hydronephrosis/BPH/urinary retention -Renal ultrasound with bilateral hydronephrosis.  -Patient noted to have chronic bladder outlet obstruction and noted to have bilateral hydronephrosis and hydroureter per CT abdomen and pelvis (08/17/2019). -Continue Foley catheter. -Continue Proscar.   -Urology consulted and recommended continuation of Foley catheter, empiric IV antibiotics.  -Patient will likely need to be discharged with Foley catheter with  outpatient follow-up with urology.  14.  Hyperglycemia -Hemoglobin A1c 6.0.   -Blood glucose of 75 this morning.   -Change IV fluids to D5 normal saline.   -Follow.   15.  Aerococcus UTI -Urine cultures with > 100,000 colonies of Aerococcus species.  -Patient with a penicillin allergy.   -IV antibiotics were narrowed to IV Rocephin which would not cover Aerococcus species (per pharmacy ) and as such we will discontinue IV Rocephin and placed back on IV vancomycin.   16.  Severe protein calorie malnutrition/failure to thrive -Patient noted to be found in bed bugs and in feces. -Dietitian consulted.   -Continue dysphagia 3 diet which she seems to be tolerating, Ensure.  -TOC consultation due to concern that APS may need to be involved. -It is noted per PCCM note that patient was previously a patient of a thyroid care from April to September 2021 and is noted that staff reported that notes reflect that daughter did not want him to return home at that time, family refused assist from staff for bath, nursing visits. -Patient with some improvement with appetite as patient noted to have eating about 90% of his breakfast the morning of 11/29/2020.  -Breakfast pending.   -Palliative care consulted for goals of care as patient noted to be nonambulatory, with progressive failure to thrive per family for several months who are no longer able to care for patient..  17.  Mechanical fall -Plain films of the pelvis unremarkable. -PT/OT assessed patient and per PT note wife had stated that patient has been bedbound for 7 months with minimal oral intake. -Will likely need long-term placement as per epic notes wife had gone to PCPs office on day of admission stating unable to take care of patient anymore and requesting placement.. -TOC consulted.  18.  Lower extremity petechiae -?  Etiology. -Patient noted to have bedbugs on admission. -ANCA panel pending.  GI pathogen panel negative.   -Follow.  19.   Elevated PSA -Per urology patient history of elevated PSA and has refused biopsy in the past.  CT done 2021 without any overt metastatic disease. -Per urology.  20.  Hypokalemia -Likely secondary to GI losses. -K-Dur 40 mEq p.o. every 4 hours x2 doses. -Magnesium at 1.7 we will give magnesium sulfate 4 g IV x1.     DVT prophylaxis: SCDs Code Status: Full Family Communication: Updated patient.  No family at bedside. Disposition:   Status is: Inpatient  Remains inpatient appropriate because:Inpatient level of care appropriate due to severity of illness  Dispo: The patient is from: Home              Anticipated d/c is to: TBD likely needs long-term placement.  Palliative care consultation pending.              Patient currently is not medically stable to d/c.   Difficult to place patient No       Consultants:  PCCM: Dr. Silas Flood 11/27/2020 Urology: Dr. Tresa Moore 11/28/2020 Gastroenterology: Dr. Lyndel Safe 11/28/2020  Palliative care: Dr. Rowe Pavy 11/29/2020  Procedures: Transfused 2 units packed red blood cells 11/28/2020 Renal ultrasound 11/27/2020 Chest x-ray 11/27/2020 Plain films of the pelvis 11/27/2020 Lower extremity Dopplers 11/28/2020   Antimicrobials:  IV vancomycin 11/27/2020>>>> 11/28/2020 IV aztreonam 11/27/2020>>>>> 11/28/2020 IV Flagyl 11/27/2020>>>> 11/30/2020 IV Rocephin 11/28/2020>>>>.  11/30/2020 IV vancomycin 11/30/2020>>>   Subjective: Laying in bed.  Alert.  Denies any chest pain.  No shortness of breath.  No abdominal pain.  States he is ready for breakfast.  Denies spitting out medications.  Agrees to take medications this morning.   Objective: Vitals:   11/30/20 0300 11/30/20 0400 11/30/20 0500 11/30/20 0600  BP:  (!) 162/78  123/84  Pulse:  74  64  Resp: '10 11 10 12  '$ Temp: (!) 95.7 F (35.4 C) (!) 95.5 F (35.3 C) (!) 95.5 F (35.3 C) (!) 95.5 F (35.3 C)  TempSrc:  Bladder  Bladder  SpO2:  97%    Weight:   66 kg   Height:        Intake/Output Summary  (Last 24 hours) at 11/30/2020 0926 Last data filed at 11/30/2020 [id number] Gross per 24 hour  Intake 2576.58 ml  Output 1175 ml  Net 1401.58 ml    Filed Weights   11/27/20 1244 11/27/20 1900 11/30/20 0500  Weight: 65.8 kg 65 kg 66 kg    Examination:  General exam: Cachectic.  Frail.  Off Retail banker. Respiratory system: CTA B anterior lung fields.  No wheezes, no crackles, no rhonchi.  Normal respiratory effort.  Cardiovascular system: RRR no murmurs rubs or gallops.  No JVD.  1-2+ bilateral lower extremity edema R >L. Gastrointestinal system: Abdomen is soft, nontender, nondistended, scaphoid.  Positive bowel sounds.  No rebound.  No guarding.  Central nervous system: Alert. No focal neurological deficits. Extremities: 1-2+ bilateral lower extremity edema R > L. Skin: Petechiae on feet and lower extremities.  Bilateral hip wounds dried with some scabs noted.   Psychiatry: Judgement and insight appear fair. Mood & affect appropriate.     Data Reviewed: I have personally reviewed following labs and imaging studies  CBC: Recent Labs  Lab 11/27/20 1328 11/28/20 0254 11/28/20 1430 11/29/20 0905 11/30/20 0303  WBC 5.2 2.7*  --  5.2 5.8  NEUTROABS 5.0 2.5  --  4.6  --   HGB 8.6* 6.6* 9.6* 10.3* 9.9*  HCT 28.2* 20.5* 28.7* 30.2* 29.7*  MCV 80.3 76.2*  --  75.1* 76.5*  PLT 161 124*  --  114* 101*     Basic Metabolic Panel: Recent Labs  Lab 11/27/20 1328 11/27/20 1927 11/28/20 0254 11/29/20 0905 11/30/20 0303  NA 132* 133* 137 135 137  K 5.2* 4.7 4.2 3.3* 2.8*  CL 104 107 111 101 99  CO2 14* 15* 16* 21* 28  GLUCOSE 256* 130* 81 61* 70  BUN 86* 75* 80* 77* 72*  CREATININE 2.66* 2.51* 2.55* 2.26* 2.20*  CALCIUM 8.2* 8.0* 8.0* 7.6* 7.7*  MG  --  2.1 2.2 2.0 1.7  PHOS  --  5.7* 5.7* 4.1 3.4     GFR: Estimated Creatinine Clearance: 25.4 mL/min (A) (by C-G formula based on SCr of 2.2 mg/dL (H)).  Liver Function Tests: Recent Labs  Lab 11/27/20 1328 11/28/20 0254  11/29/20 0905 11/30/20 0303  AST 64* 99* 150* 140*  ALT 32 34 45* 45*  ALKPHOS 92 75 73 72  BILITOT 1.1 0.8 0.8 0.5  PROT 5.4* 5.4* 5.2* 5.0*  ALBUMIN 2.6* 3.0* 2.7* 2.6*  CBG: Recent Labs  Lab 11/27/20 1305 11/27/20 1427 11/29/20 1233 11/29/20 1819 11/30/20 0045  GLUCAP 54* 139* 139* 83 75      Recent Results (from the past 240 hour(s))  Blood Culture (routine x 2)     Status: None (Preliminary result)   Collection Time: 11/27/20  1:28 PM   Specimen: BLOOD  Result Value Ref Range Status   Specimen Description   Final    BLOOD ARM Performed at [facility] [address], [place], [place] [address]    Special Requests   Final    BOTTLES DRAWN AEROBIC AND ANAEROBIC Blood Culture results may not be optimal due to an inadequate volume of blood received in culture bottles Performed at [facility] [address] [place], [place] [address]    Culture   Final    NO GROWTH 2 DAYS Performed at [facility] [address] [place], [place] [address]    Report Status PENDING  Incomplete  Blood Culture (routine x 2)     Status: Abnormal (Preliminary result)   Collection Time: 11/27/20  1:44 PM   Specimen: BLOOD  Result Value Ref Range Status   Specimen Description   Final    BLOOD ARM LEFT Performed at [facility] [address], [place], [place] [address]    Special Requests   Final    BOTTLES DRAWN AEROBIC AND ANAEROBIC Blood Culture results may not be optimal due to an inadequate volume of blood received in culture bottles Performed at [facility] [address], [place], [place] [address]    Culture  Setup Time (A)  Final    GRAM VARIABLE ROD ANAEROBIC BOTTLE ONLY CRITICAL RESULT CALLED TO, READ BACK BY AND VERIFIED WITHGuadlupe Spanish PHARMD [id number] 11/28/20 A BROWNING    Culture (A)  Final    GRAM VARIABLE ROD CULTURE REINCUBATED FOR BETTER GROWTH Performed at [facility], [place] [address], [place], [place] [address]    Report Status PENDING  Incomplete  Blood Culture ID Panel (Reflexed)     Status: None   Collection Time: 11/27/20  1:44 PM  Result Value Ref Range Status   Enterococcus faecalis NOT DETECTED NOT DETECTED Final   Enterococcus Faecium NOT DETECTED NOT DETECTED Final   Listeria monocytogenes NOT DETECTED NOT DETECTED Final   Staphylococcus species NOT DETECTED NOT DETECTED Final   Staphylococcus aureus (BCID) NOT DETECTED NOT DETECTED Final   Staphylococcus epidermidis NOT DETECTED NOT DETECTED Final   Staphylococcus lugdunensis NOT DETECTED NOT DETECTED Final   Streptococcus species NOT DETECTED NOT DETECTED Final   Streptococcus agalactiae NOT DETECTED NOT DETECTED Final   Streptococcus pneumoniae NOT DETECTED NOT DETECTED Final   Streptococcus pyogenes NOT DETECTED NOT DETECTED Final   A.calcoaceticus-baumannii NOT DETECTED NOT DETECTED Final   Bacteroides fragilis NOT DETECTED NOT DETECTED Final   Enterobacterales NOT DETECTED NOT DETECTED Final   Enterobacter cloacae complex NOT DETECTED NOT DETECTED Final   Escherichia coli NOT DETECTED NOT DETECTED Final   Klebsiella aerogenes NOT DETECTED NOT DETECTED Final   Klebsiella oxytoca NOT DETECTED NOT DETECTED Final   Klebsiella pneumoniae NOT DETECTED NOT DETECTED Final   Proteus species NOT DETECTED NOT DETECTED Final   Salmonella species NOT DETECTED NOT DETECTED Final   Serratia marcescens NOT DETECTED NOT DETECTED Final   Haemophilus influenzae NOT DETECTED NOT DETECTED Final   Neisseria meningitidis NOT DETECTED NOT DETECTED Final   Pseudomonas aeruginosa  NOT DETECTED NOT DETECTED Final   Stenotrophomonas maltophilia NOT DETECTED NOT DETECTED Final   Candida albicans NOT DETECTED NOT DETECTED Final   Candida auris NOT DETECTED NOT DETECTED Final   Candida glabrata NOT DETECTED NOT DETECTED Final   Candida krusei NOT DETECTED NOT DETECTED Final   Candida parapsilosis NOT  DETECTED NOT DETECTED Final   Candida tropicalis NOT DETECTED NOT DETECTED Final   Cryptococcus neoformans/gattii NOT DETECTED NOT DETECTED Final    Comment: Performed at [facility], [place] [address] [place], [place] [address]  Resp Panel by RT-PCR (Flu A&B, Covid) Nasopharyngeal Swab     Status: None   Collection Time: 11/27/20  1:46 PM   Specimen: Nasopharyngeal Swab; Nasopharyngeal(NP) swabs in vial transport medium  Result Value Ref Range Status   SARS Coronavirus 2 by RT PCR NEGATIVE NEGATIVE Final    Comment: (NOTE) SARS-CoV-2 target nucleic acids are NOT DETECTED.  The SARS-CoV-2 RNA is generally detectable in upper respiratory specimens during the acute phase of infection. The lowest concentration of SARS-CoV-2 viral copies this assay can detect is 138 copies/mL. A negative result does not preclude SARS-Cov-2 infection and should not be used as the sole basis for treatment or other patient management decisions. A negative result may occur with  improper specimen collection/handling, submission of specimen other than nasopharyngeal swab, presence of viral mutation(s) within the areas targeted by this assay, and inadequate number of viral copies(<138 copies/mL). A negative result must be combined with clinical observations, patient history, and epidemiological information. The expected result is Negative.  Fact Sheet for Patients:  EntrepreneurPulse.com.au  Fact Sheet for Healthcare Providers:  IncredibleEmployment.be  This test is no t yet approved or cleared by the [place] FDA and  has been authorized for detection and/or diagnosis of SARS-CoV-2 by FDA under an Emergency Use Authorization (EUA). This EUA will remain  in effect (meaning this test can be used) for the duration of the COVID-19 declaration under Section 564(b)(1) of the Act, 21 U.S.C.section 360bbb-3(b)(1), unless the authorization is terminated  or revoked  sooner.       Influenza A by PCR NEGATIVE NEGATIVE Final   Influenza B by PCR NEGATIVE NEGATIVE Final    Comment: (NOTE) The Xpert Xpress SARS-CoV-2/FLU/RSV plus assay is intended as an aid in the diagnosis of influenza from Nasopharyngeal swab specimens and should not be used as a sole basis for treatment. Nasal washings and aspirates are unacceptable for Xpert Xpress SARS-CoV-2/FLU/RSV testing.  Fact Sheet for Patients: EntrepreneurPulse.com.au  Fact Sheet for Healthcare Providers: IncredibleEmployment.be  This test is not yet approved or cleared by the [place] FDA and has been authorized for detection and/or diagnosis of SARS-CoV-2 by FDA under an Emergency Use Authorization (EUA). This EUA will remain in effect (meaning this test can be used) for the duration of the COVID-19 declaration under Section 564(b)(1) of the Act, 21 U.S.C. section 360bbb-3(b)(1), unless the authorization is terminated or revoked.  Performed at [facility], [place] [address] [place], [place] [address]   Urine Culture     Status: Abnormal   Collection Time: 11/27/20  1:48 PM   Specimen: Urine, Catheterized  Result Value Ref Range Status   Specimen Description   Final    URINE, CATHETERIZED Performed at [facility] [address] [place], [place] [address]    Special Requests   Final    NONE Performed at [facility], [place] [address] [place], [place] [address]  Culture (A)  Final    >=100,000 COLONIES/mL AEROCOCCUS SPECIES Standardized susceptibility testing for this organism is not available. Performed at [facility], [place] [address] [place], [place] [address]    Report Status 11/29/2020 FINAL  Final  Gastrointestinal Panel by PCR , Stool     Status: None   Collection Time: 11/27/20  7:31 PM   Specimen: Stool  Result Value Ref Range Status   Campylobacter species NOT DETECTED NOT  DETECTED Final   Plesimonas shigelloides NOT DETECTED NOT DETECTED Final   Salmonella species NOT DETECTED NOT DETECTED Final   Yersinia enterocolitica NOT DETECTED NOT DETECTED Final   Vibrio species NOT DETECTED NOT DETECTED Final   Vibrio cholerae NOT DETECTED NOT DETECTED Final   Enteroaggregative E coli (EAEC) NOT DETECTED NOT DETECTED Final   Enteropathogenic E coli (EPEC) NOT DETECTED NOT DETECTED Final   Enterotoxigenic E coli (ETEC) NOT DETECTED NOT DETECTED Final   Shiga like toxin producing E coli (STEC) NOT DETECTED NOT DETECTED Final   Shigella/Enteroinvasive E coli (EIEC) NOT DETECTED NOT DETECTED Final   Cryptosporidium NOT DETECTED NOT DETECTED Final   Cyclospora cayetanensis NOT DETECTED NOT DETECTED Final   Entamoeba histolytica NOT DETECTED NOT DETECTED Final   Giardia lamblia NOT DETECTED NOT DETECTED Final   Adenovirus F40/41 NOT DETECTED NOT DETECTED Final   Astrovirus NOT DETECTED NOT DETECTED Final   Norovirus GI/GII NOT DETECTED NOT DETECTED Final   Rotavirus A NOT DETECTED NOT DETECTED Final   Sapovirus (I, II, IV, and V) NOT DETECTED NOT DETECTED Final    Comment: Performed at [facility], [address] [place], [place] [address]  C Difficile Quick Screen w PCR reflex     Status: None   Collection Time: 11/27/20  7:32 PM   Specimen: STOOL  Result Value Ref Range Status   C Diff antigen NEGATIVE NEGATIVE Final   C Diff toxin NEGATIVE NEGATIVE Final   C Diff interpretation No C. difficile detected.  Final    Comment: Performed at [facility], [place] [address], [place], [place] [address]  MRSA Next Gen by PCR, Nasal     Status: None   Collection Time: 11/29/20  5:25 AM   Specimen: Nasal Mucosa; Nasal Swab  Result Value Ref Range Status   MRSA by PCR Next Gen NOT DETECTED NOT DETECTED Final    Comment: (NOTE) The GeneXpert MRSA Assay (FDA approved for NASAL specimens only), is one component of a comprehensive MRSA colonization  surveillance program. It is not intended to diagnose MRSA infection nor to guide or monitor treatment for MRSA infections. Test performance is not FDA approved in patients less than 63 years old. Performed at [facility], [place] [address] [place],  [address]           Radiology Studies: VAS [place] LOWER EXTREMITY VENOUS (DVT)  Result Date: 11/28/2020  Lower Venous DVT Study Patient Name:  JOZSEF BROUSE  Date of Exam:   11/28/2020 Medical Rec #: [id number]         Accession #:    [id number] Date of Birth: 1941/02/08         Patient Gender: M Patient Age:   57 years Exam Location:  [facility] Procedure:      VAS [place] LOWER EXTREMITY VENOUS (DVT) Referring Phys: Irine Seal --------------------------------------------------------------------------------  Indications: Edema.  Risk Factors: Immobility DVT LLE - 2021 infrarenal IVC filter present. Limitations: Body habitus and calcific shadowing, poor patient  positioning. Comparison Study: Previous exam to LLE 05/11/19 Positive DVT (extensive) Performing Technologist: Rogelia Rohrer RVT, RDMS  Examination Guidelines: A complete evaluation includes B-mode imaging, spectral Doppler, color Doppler, and power Doppler as needed of all accessible portions of each vessel. Bilateral testing is considered an integral part of a complete examination. Limited examinations for reoccurring indications may be performed as noted. The reflux portion of the exam is performed with the patient in reverse Trendelenburg.  +---------+---------------+---------+-----------+----------+-----------------+ RIGHT    CompressibilityPhasicitySpontaneityPropertiesThrombus Aging    +---------+---------------+---------+-----------+----------+-----------------+ CFV      None           No       No                   Age Indeterminate +---------+---------------+---------+-----------+----------+-----------------+ SFJ      None                                          Age Indeterminate +---------+---------------+---------+-----------+----------+-----------------+ FV Prox  None           No       No                   Acute             +---------+---------------+---------+-----------+----------+-----------------+ FV Mid   Partial        No       No                   Acute             +---------+---------------+---------+-----------+----------+-----------------+ FV DistalNone           No       No                   Acute             +---------+---------------+---------+-----------+----------+-----------------+ PFV      None           No       No                   Acute             +---------+---------------+---------+-----------+----------+-----------------+ POP      None           No       No                   Age Indeterminate +---------+---------------+---------+-----------+----------+-----------------+ PTV      None           No       No                   Acute             +---------+---------------+---------+-----------+----------+-----------------+ PERO     None           No       No                   Acute             +---------+---------------+---------+-----------+----------+-----------------+ GSV      None           No       No  Acute             +---------+---------------+---------+-----------+----------+-----------------+ SSV      None           No       No                   Age Indeterminate +---------+---------------+---------+-----------+----------+-----------------+   +---------+---------------+---------+-----------+----------+--------------+ LEFT     CompressibilityPhasicitySpontaneityPropertiesThrombus Aging +---------+---------------+---------+-----------+----------+--------------+ CFV      Full           Yes      Yes                                 +---------+---------------+---------+-----------+----------+--------------+ SFJ      Full                                                         +---------+---------------+---------+-----------+----------+--------------+ FV Prox  Full           Yes      Yes                                 +---------+---------------+---------+-----------+----------+--------------+ FV Mid   Full           Yes      Yes                                 +---------+---------------+---------+-----------+----------+--------------+ FV DistalPartial        Yes      Yes                  Chronic        +---------+---------------+---------+-----------+----------+--------------+ PFV      Full                                                        +---------+---------------+---------+-----------+----------+--------------+ POP      Full           Yes      Yes                                 +---------+---------------+---------+-----------+----------+--------------+ PTV      Full                                                        +---------+---------------+---------+-----------+----------+--------------+ PERO     Full                                                        +---------+---------------+---------+-----------+----------+--------------+     Summary: BILATERAL: -No evidence of popliteal cyst, bilaterally.  RIGHT: - Findings consistent with acute deep vein thrombosis involving the right proximal profunda vein, right femoral vein, right posterior tibial veins, and right peroneal veins. - Findings consistent with acute superficial vein thrombosis involving the right great saphenous vein. - Findings consistent with age indeterminate deep vein thrombosis involving the right common femoral vein, SF junction, and right popliteal vein. - Findings consistent with age indeterminate superficial vein thrombosis involving the right small saphenous vein.  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 - There is no evidence of superficial venous  thrombosis.  *See table(s) above for measurements and observations. Electronically signed by Deitra Mayo MD on 11/28/2020 at 2:33:51 PM.    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 Limitations: Habitus, calcific shadowing.  Comparison Study: CT ABD/PELVIS 08/17/2019 - positive for thrombus in IVC, right                   CIV, and right EIV Performing Technologist: Jody Hill RVT, RDMS  Examination Guidelines: A complete evaluation includes B-mode imaging, spectral Doppler, color Doppler, and power Doppler as needed of all accessible portions of each vessel. Bilateral testing is considered an integral part of a complete examination. Limited examinations for reoccurring indications may be performed as noted.  IVC/Iliac Findings: +----------+------+--------+--------+    IVC    PatentThrombusComments +----------+------+--------+--------+ IVC Prox  patent                 +----------+------+--------+--------+ IVC Mid         chronic          +----------+------+--------+--------+ IVC Distal      chronic          +----------+------+--------+--------+  +-------------------+---------+-----------+---------+-----------+--------------+         CIV        RT-PatentRT-ThrombusLT-PatentLT-Thrombus   Comments    +-------------------+---------+-----------+---------+-----------+--------------+ Common Iliac Prox              chronic   patent                            +-------------------+---------+-----------+---------+-----------+--------------+ Common Iliac Mid              chronic   patent                            +-------------------+---------+-----------+---------+-----------+--------------+ Common Iliac Distal                                        Not visualized +-------------------+---------+-----------+---------+-----------+--------------+ Unable to compress LT CIV due to overlying AAA +-------------------------+---------+-----------+---------+-----------+--------+  EIV           RT-PatentRT-ThrombusLT-PatentLT-ThrombusComments +-------------------------+---------+-----------+---------+-----------+--------+ External Iliac Vein Prox            chronic   patent                      +-------------------------+---------+-----------+---------+-----------+--------+ External Iliac Vein Mid             chronic   patent                      +-------------------------+---------+-----------+---------+-----------+--------+ External Iliac Vein                 chronic   patent                      Distal                                                                    +-------------------------+---------+-----------+---------+-----------+--------+   Summary: IVC/Iliac: 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 There is chronic thrombus in the right common and external iliac veins. The left common and external iliac veins are patent.  *See table(s) above for measurements and observations.  Electronically signed by Deitra Mayo MD on 11/28/2020 at 2:37:45 PM.    Final         Scheduled Meds:  chlorhexidine  15 mL Mouth Rinse BID   Chlorhexidine Gluconate Cloth  6 each Topical Daily   collagenase   Topical Daily   feeding supplement  1 Container Oral TID BM   feeding supplement  237 mL Oral Q24H   ferrous sulfate  325 mg Oral Q breakfast    finasteride  5 mg Oral Daily   folic acid  1 mg Oral Daily   loperamide  2 mg Oral Q6H   mouth rinse  15 mL Mouth Rinse q12n4p   multivitamin with minerals  1 tablet Oral Daily   nutrition supplement (JUVEN)  1 packet Oral BID BM   pantoprazole (PROTONIX) IV  40 mg Intravenous Q12H   potassium chloride  40 mEq Oral Q4H   sodium chloride flush  3 mL Intravenous Q12H   umeclidinium bromide  1 puff Inhalation Daily   Continuous Infusions:  sodium chloride Stopped (11/29/20 1613)   cefTRIAXone (ROCEPHIN)  IV Stopped (11/29/20 1424)   dextrose 5 % and 0.9% NaCl 100 mL/hr at 11/30/20 0806   magnesium sulfate bolus IVPB     metronidazole 500 mg (11/30/20 0847)   vancomycin       LOS: 3 days    Time spent: 40 minutes    Irine Seal, MD [facility]   To contact the attending provider between 7A-7P or the covering provider during after hours 7P-7A, please log into the web site [email or web address] and access using universal [place] password for that web site. If you do not have the password, please call the hospital operator.  11/30/2020, 9:26 AM

## 2020-12-01 DIAGNOSIS — A419 Sepsis, unspecified organism: Secondary | ICD-10-CM | POA: Diagnosis not present

## 2020-12-01 DIAGNOSIS — N390 Urinary tract infection, site not specified: Secondary | ICD-10-CM

## 2020-12-01 DIAGNOSIS — Z515 Encounter for palliative care: Secondary | ICD-10-CM | POA: Diagnosis not present

## 2020-12-01 DIAGNOSIS — N179 Acute kidney failure, unspecified: Secondary | ICD-10-CM | POA: Diagnosis not present

## 2020-12-01 DIAGNOSIS — R531 Weakness: Secondary | ICD-10-CM | POA: Diagnosis not present

## 2020-12-01 DIAGNOSIS — N401 Enlarged prostate with lower urinary tract symptoms: Secondary | ICD-10-CM | POA: Diagnosis not present

## 2020-12-01 DIAGNOSIS — R6520 Severe sepsis without septic shock: Secondary | ICD-10-CM

## 2020-12-01 DIAGNOSIS — Z7189 Other specified counseling: Secondary | ICD-10-CM | POA: Diagnosis not present

## 2020-12-01 DIAGNOSIS — R7881 Bacteremia: Secondary | ICD-10-CM | POA: Diagnosis not present

## 2020-12-01 LAB — BPAM RBC
Blood Product Expiration Date: 202208212359
Blood Product Expiration Date: 202208212359
Blood Product Expiration Date: 202209242359
ISSUE DATE / TIME: 202208190515
ISSUE DATE / TIME: 202208190819
Unit Type and Rh: 5100
Unit Type and Rh: 5100
Unit Type and Rh: 5100

## 2020-12-01 LAB — CBC WITH DIFFERENTIAL/PLATELET
Band Neutrophils: 3 %
Basophils Relative: 0 %
Blasts: NONE SEEN %
Eosinophils Relative: 1 %
HCT: 30.6 % — ABNORMAL LOW (ref 39.0–52.0)
Hemoglobin: 9.8 g/dL — ABNORMAL LOW (ref 13.0–17.0)
Lymphocytes Relative: 6 %
MCH: 25.4 pg — ABNORMAL LOW (ref 26.0–34.0)
MCHC: 32 g/dL (ref 30.0–36.0)
MCV: 79.3 fL — ABNORMAL LOW (ref 80.0–100.0)
Metamyelocytes Relative: NONE SEEN %
Monocytes Relative: 2 %
Myelocytes: NONE SEEN %
Neutrophils Relative %: 88 %
Platelets: 107 10*3/uL — ABNORMAL LOW (ref 150–400)
Promyelocytes Relative: NONE SEEN %
RBC: 3.86 MIL/uL — ABNORMAL LOW (ref 4.22–5.81)
RDW: 22.2 % — ABNORMAL HIGH (ref 11.5–15.5)
WBC Morphology: NORMAL
WBC: 4.5 10*3/uL (ref 4.0–10.5)
nRBC: 0.9 % — ABNORMAL HIGH (ref 0.0–0.2)
nRBC: 3 /100 WBC — ABNORMAL HIGH

## 2020-12-01 LAB — TYPE AND SCREEN
ABO/RH(D): O POS
Antibody Screen: NEGATIVE
Unit division: 0
Unit division: 0
Unit division: 0

## 2020-12-01 LAB — COMPREHENSIVE METABOLIC PANEL
ALT: 41 U/L (ref 0–44)
AST: 103 U/L — ABNORMAL HIGH (ref 15–41)
Albumin: 2.3 g/dL — ABNORMAL LOW (ref 3.5–5.0)
Alkaline Phosphatase: 63 U/L (ref 38–126)
Anion gap: 9 (ref 5–15)
BUN: 59 mg/dL — ABNORMAL HIGH (ref 8–23)
CO2: 23 mmol/L (ref 22–32)
Calcium: 7.6 mg/dL — ABNORMAL LOW (ref 8.9–10.3)
Chloride: 104 mmol/L (ref 98–111)
Creatinine, Ser: 1.91 mg/dL — ABNORMAL HIGH (ref 0.61–1.24)
GFR, Estimated: 35 mL/min — ABNORMAL LOW (ref >60)
Glucose, Bld: 146 mg/dL — ABNORMAL HIGH (ref 70–99)
Potassium: 3.2 mmol/L — ABNORMAL LOW (ref 3.5–5.1)
Sodium: 136 mmol/L (ref 135–145)
Total Bilirubin: 0.6 mg/dL (ref 0.3–1.2)
Total Protein: 4.8 g/dL — ABNORMAL LOW (ref 6.5–8.1)

## 2020-12-01 LAB — CULTURE, BLOOD (ROUTINE X 2)

## 2020-12-01 LAB — GLUCOSE, CAPILLARY
Glucose-Capillary: 121 mg/dL — ABNORMAL HIGH (ref 70–99)
Glucose-Capillary: 134 mg/dL — ABNORMAL HIGH (ref 70–99)
Glucose-Capillary: 138 mg/dL — ABNORMAL HIGH (ref 70–99)

## 2020-12-01 LAB — MAGNESIUM: Magnesium: 2.5 mg/dL — ABNORMAL HIGH (ref 1.7–2.4)

## 2020-12-01 MED ORDER — AMOXICILLIN 500 MG PO CAPS
500.0000 mg | ORAL_CAPSULE | Freq: Two times a day (BID) | ORAL | Status: DC
  Administered 2020-12-01 (×2): 500 mg via ORAL
  Filled 2020-12-01 (×3): qty 1

## 2020-12-01 MED ORDER — DEXTROSE-NACL 5-0.9 % IV SOLN: INTRAVENOUS | Status: DC

## 2020-12-01 MED ORDER — HEPARIN SODIUM (PORCINE) 5000 UNIT/ML IJ SOLN
5000.0000 [IU] | Freq: Three times a day (TID) | INTRAMUSCULAR | Status: DC
  Administered 2020-12-01 – 2020-12-04 (×11): 5000 [IU] via SUBCUTANEOUS
  Filled 2020-12-01 (×11): qty 1

## 2020-12-01 MED ORDER — POTASSIUM CHLORIDE CRYS ER 20 MEQ PO TBCR
40.0000 meq | EXTENDED_RELEASE_TABLET | ORAL | Status: AC
  Administered 2020-12-01 (×2): 40 meq via ORAL
  Filled 2020-12-01 (×2): qty 2

## 2020-12-01 NOTE — Progress Notes (Signed)
PROGRESS NOTE    Henry Carter  [id number] DOB: October 19, 1940 DOA: 11/27/2020 PCP: [name], MD   Chief Complaint  Patient presents with   Failure To Thrive    Coming from home with wife- House was dirty and in a disarray.  ? bedbugs    Brief Narrative:  Patient 80 year old gentleman history of iron deficiency anemia, BPH, chronic bladder outlet obstruction, COPD, hypertension, GERD, PVD presented to the ED via EMS after he was noted to rolled off the bed onto the floor.  Per epic review it is noted that patient's wife presented to PCPs office stating she needed help and could not take care of patient any longer and needed a letter to get patient placed in that patient had rolled off the bed onto the floor.  PCPs office subsequently called EMS patient brought to the ED.  Patient on presentation noted to have a profound hypothermia with a temperature of 85, generalized weakness, mottled lower extremities and noted to be covered in stool and bedbugs with some bilateral hip wounds.  Patient admitted with severe sepsis and placed empirically on IV antibiotics.  Patient placed on Bair hugger and admitted to the stepdown unit.   Assessment & Plan:   Principal Problem:   Severe sepsis with acute organ dysfunction (HCC) Active Problems:   Hypothermia   Hyperlipidemia   Essential hypertension   PERIPHERAL VASCULAR DISEASE   Benign prostatic hyperplasia   Diarrhea   Iron deficiency anemia due to chronic blood loss   Acute renal failure (ARF) (HCC)   Hyperkalemia   Metabolic acidosis   Chronic indwelling Foley catheter   Acute lower UTI   Transaminitis   Severe protein-calorie malnutrition (HCC)   Bilateral edema of lower extremity   Hyperglycemia   Dehydration   Bilateral hydronephrosis   Right leg DVT (HCC)   Pancytopenia (HCC)   Palliative care by specialist   Goals of care, counseling/discussion   General weakness   Bacteremia  1 severe sepsis with organ  dysfunction, POA -Patient presented with criteria/signs of severe sepsis with organ dysfunction with severe hypothermia with a temperature of 85, noted to be hypotensive on admission with blood pressure of 84/51, lactic acid level elevated at 3.9, and acute renal failure with a creatinine of 2.66, urinalysis was cloudy and milky, with moderate leukocytes, nitrite negative, many bacteria, > 50 WBCs with concerns for UTI.  Patient also noted with profuse diarrhea. -Patient pancultured with blood cultures with Clostridium perfringens.  -Urine cultures with > 100,000 aerococcus species. -Chest x-ray with no acute infiltrate. -Repeat lactic acid level trending down.   -C. difficile PCR negative.   -GI pathogen panel negative -.  Status post IV albumin x1.  -Discontinued bicarb drip and placed on D5 normal saline. -IV vancomycin, IV aztreonam initially narrowed to IV Rocephin and IV Flagyl.   -Urine cultures with Aerococcus species.  Patient with penicillin allergy.   -Discontinued IV Rocephin, IV Flagyl and placed empirically on IV vancomycin to complete course of antibiotic treatment.   -Bair hugger. -Consult with ID for further recommendations in terms of antibiotic recommendations and duration. -PCCM was consulted and assessed the patient and have signed off.   -Supportive care.    2.  Hypothermia -Likely secondary to problem #1. -Patient pancultured with blood cultures positive for Clostridium perfringens.  -Urine cultures with Aerococcus species. -TSH of 4.521.  -Hypothermia slowly improving.  -IV antibiotics were narrowed down to IV Flagyl and IV Rocephin.  -Patient noted to have a  penicillin allergy and as such IV antibiotics changed to IV vancomycin due to urine culture results of Aerococcus species.   Engineering geologist.    3.  Hypotension Likely multifactorial secondary to hypovolemia from GI losses from profuse diarrhea, infectious etiology from probable UTI.  -Patient has been  pancultured cultures pending.  -Blood pressure improving with IV fluids.   -Continue IV fluids, empiric IV antibiotics  .  -Follow.    4.  Acute renal failure -Likely secondary to prerenal azotemia secondary to profuse ongoing diarrhea from GI losses and post renal azotemia with bilateral hydronephrosis noted on renal ultrasound. -Renal ultrasound also consistent with medical renal disease. -Creatinine currently at 1.91 from 2.20 from 2.26 from 2.55 from 2.66 on admission. -Patient noted to have a creatinine of 1.29 on 07/19/2019. -.  Urinalysis concerning for UTI with 100 protein.  -Urine sodium 48, urine creatinine 22.66.  -FeNa 4.  -Urine output picking up and patient with a urine output of 1.850 L over the past 24 hours.  -Continue Foley catheter which patient will be discharged on due to bilateral hydronephrosis. -ANCA panel pending.   -Decrease IV fluids to 75 cc an hour.  -Strict I's and O's.   -Daily weights.   5.  Diarrhea -Patient with profuse ongoing diarrhea found in his feces on presentation. -Patient noted to be hypothermic, concern for severe sepsis. -C. difficile PCR negative.   -GI pathogen panel negative. -Patient noted to have had a colonoscopy in 2014 which showed a sessile polyp. -Patient diarrhea improved with scheduled Imodium.   -Scheduled Imodium discontinued, continue Imodium as needed.  -Patient seen in consultation by GI who are recommending Imodium as needed.   6.  Dehydration -Likely secondary to poor oral intake, GI losses. -Saline lock IV fluids.   7.  GERD -Continue IV PPI and likely transition to oral PPI in the next 24 hours.  8.  Hyperkalemia -Likely secondary to acute renal failure. -Now with hypokalemia.   -Replete potassium and follow. -Follow.  9.  Metabolic acidosis -Patient with elevated lactic acid level, presenting with profuse watery diarrhea with GI losses, noted to be hypotensive, concern for severe sepsis with urinalysis  concerning for UTI. -Patient pancultured. -C. difficile PCR negative.  GI pathogen panel negative.   -Diarrhea improved on scheduled Imodium.   -Acidosis resolved.   -Bicarb discontinued.   -Decrease IV fluids to D5 normal saline at 75 cc an hour. -Continue empiric IV antibiotics.  10.  Lower extremity edema R > L/acute right lower extremity DVT -Patient with lower extremity edema noted on examination with bilateral lower extremity petechiae noted. -Lower extremity Dopplers with acute right lower extremity DVT.  -Patient with history of IVC filter insertion 06/11/2019 secondary to history of DVT with GI bleed.  -Patient noted to have a prior history of lower extremity DVT. -Status post IV albumin x1. -Patient with multiple medical problems, cachectic, poor prognosis. -With a history of prior IVC filter and history of prior GI bleed we will hold off on anticoagulation at this time. -Discussed with vascular surgery. -Follow.  45.  Transaminitis -Felt likely secondary to shock liver.   -Acute hepatitis panel negative.  -Slowly trending down.   -Abdominal ultrasound with layering sludge within the gallbladder, complex small pericholecystic fluid collection noted of uncertain significance, limited evaluation of left hepatic lobe, kidneys, pancreas, spleen, IVC.  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   -Palliative care consulted and following.   -Repeat labs in the  a.m.   12.  Anemia/pancytopenia -Pancytopenia improving.   -Likely secondary to sepsis versus dilutional effect.   -Patient status post transfusion 2 units packed red blood cells.  -Hemoglobin currently at 9.8 from 9.9 from 10.3 from 6.6 (11/28/2020 ) from 8.6 on admission hemoglobin at 6.6 on 11/28/2020 from 8.6 on admission.  -Patient with no overt bleeding.   -No overt bleeding noted. -Patient with prior history of gastritis, AVM per EGD of 10/10/2012, sessile polyps noted on colonoscopy of  10/10/2012.  -Anemia panel with iron of 43, TIBC of 289, ferritin of 192, folate of 7.3, B12 of 976.  -Prophylactic dose heparin discontinued initially and will resume. -Continue PPI every 12 hours. -Patient seen in consultation by GI who feel no endoscopic procedures are warranted at this time, recommended supportive treatment, palliative care evaluation. -Follow H&H. -Transfusion threshold hemoglobin < 7.   13.  Bilateral hydronephrosis/BPH/urinary retention -Renal ultrasound with bilateral hydronephrosis.  -Patient noted to have chronic bladder outlet obstruction and noted to have bilateral hydronephrosis and hydroureter per CT abdomen and pelvis (08/17/2019). -Foley catheter in place with a urine output of 1.850 cc recorded over the past 24 hours. -Continue Proscar.   -Urology consulted and recommended continuation of Foley catheter, empiric IV antibiotics.  -Patient will need to be discharged with Foley catheter in place, with outpatient follow-up with urology.  14.  Hyperglycemia -Hemoglobin A1c 6.0.   -CBG 121 this morning.   -Saline lock IV fluids.   -Follow.    15.  Aerococcus UTI/Clostridium perfringens bacteremia -Urine cultures with > 100,000 colonies of Aerococcus species.  -1/4 blood cultures with Clostridium perfringens. --Patient with a penicillin allergy.   -IV antibiotics were narrowed to IV Rocephin which would not cover Aerococcus species (per pharmacy ) and as such patient placed back on IV vancomycin.  -Consult with ID for antibiotic recommendations and duration.   16.  Severe protein calorie malnutrition/failure to thrive -Patient noted to be found in bed bugs and in feces. -Dietitian consulted.   -Continue dysphagia 3 diet which she seems to be tolerating, Ensure.  -TOC consulted due to concern that APS may need to be involved. -It is noted per PCCM note that patient was previously a patient of a thyroid care from April to September 2021 and is noted that staff  reported that notes reflect that daughter did not want him to return home at that time, family refused assist from staff for bath, nursing visits. -Patient with some improvement with appetite as patient noted to have eating about 90% of his breakfast the morning of 11/29/2020.  -Breakfast tray not at bedside yet. -Palliative care consulted for goals of care as patient noted to be nonambulatory, with progressive failure to thrive per family for several months who are no longer able to care for patient.. -Patient now a DNR and per palliative care family wanted to continue current mode of care and hopeful for some degree of stabilization with disposition likely home with hospice versus SNF rehab with palliative care  17.  Mechanical fall -Plain films of the pelvis unremarkable. -PT/OT assessed patient and per PT note wife had stated that patient has been bedbound for 7 months with minimal oral intake. -Will likely need long-term placement as per epic notes wife had gone to PCPs office on day of admission stating unable to take care of patient anymore and requesting placement.. -TOC consulted.  18.  Lower extremity petechiae -?  Etiology. -Patient noted to have bedbugs on admission. -ANCA panel pending.  GI pathogen panel negative.   -Follow.  19.  Elevated PSA -Per urology patient history of elevated PSA and has refused biopsy in the past.  CT done 2021 without any overt metastatic disease. -Per urology.  20.  Hypokalemia -Likely secondary to GI losses. -Potassium at 3.2.   -K-Dur 40 mEq p.o. every 4 hours x2 doses.   -Follow.  20.  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 -Noted to have been there at least 15 months ago and chronic and stable per vascular surgery. -Discussed with vascular surgery, Dr. Donzetta Matters who reviewed films and felt no further intervention or recommendations needed at this time.   DVT prophylaxis:  Heparin Code Status: DNR Family Communication: Updated patient.  No family at bedside. Disposition:   Status is: Inpatient  Remains inpatient appropriate because:Inpatient level of care appropriate due to severity of illness  Dispo: The patient is from: Home              Anticipated d/c is to: TBD likely needs long-term placement versus SNF with palliative care following.               Patient currently is not medically stable to d/c.   Difficult to place patient No       Consultants:  PCCM: Dr. Silas Flood 11/27/2020 Urology: Dr. Tresa Moore 11/28/2020 Gastroenterology: Dr. Lyndel Safe 11/28/2020 Palliative care: Dr. Rowe Pavy 11/29/2020 ID pending Curb sided: Vascular surgery: Dr. Donzetta Matters 12/01/2020  Procedures: Transfused 2 units packed red blood cells 11/28/2020 Renal ultrasound 11/27/2020 Chest x-ray 11/27/2020 Plain films of the pelvis 11/27/2020 Lower extremity Dopplers 11/28/2020   Antimicrobials:  IV vancomycin 11/27/2020>>>> 11/28/2020 IV aztreonam 11/27/2020>>>>> 11/28/2020 IV Flagyl 11/27/2020>>>> 11/30/2020 IV Rocephin 11/28/2020>>>>.  11/30/2020 IV vancomycin 11/30/2020>>>   Subjective: Laying in bed.  No chest pain.  No shortness of breath.  No abdominal pain.  States he is feeling okay.  States diet is doing fine.  No bleeding.  Objective: Vitals:   12/01/20 0349 12/01/20 0400 12/01/20 0600 12/01/20 0831  BP:  (!) 153/88 127/84   Pulse:  78 70   Resp: '10 20 10   '$ Temp: (!) 95.9 F (35.5 C) (!) 96.1 F (35.6 C) (!) 97 F (36.1 C)   TempSrc: Bladder Bladder    SpO2:  100% 98% 97%  Weight:      Height:        Intake/Output Summary (Last 24 hours) at 12/01/2020 0901 Last data filed at 12/01/2020 0600 Gross per 24 hour  Intake 3127.09 ml  Output 1850 ml  Net 1277.09 ml    Filed Weights   11/27/20 1244 11/27/20 1900 11/30/20 0500  Weight: 65.8 kg 65 kg 66 kg    Examination:  General exam: Cachectic.  Frail.  Emaciated.  On Retail banker. Respiratory system: Lungs clear to  auscultation bilaterally anterior lung fields.  No wheezes, no crackles, no rhonchi.  Normal respiratory effort.  Speaking in full sentences. Cardiovascular system: Regular rate and rhythm no murmurs rubs or gallops.  No JVD.  2+ bilateral lower extremity edema R > L Gastrointestinal system: Abdomen is soft, nontender, nondistended, scaphoid.  Positive bowel sounds.  No rebound.  No guarding.   Central nervous system: Alert. No focal neurological deficits. Extremities: 2+ bilateral lower extremity edema R > L. Skin: Petechiae on feet and lower extremities.  Bilateral hip wounds dried with some scabs noted.   Psychiatry: Judgement and insight appear fair. Mood & affect appropriate.  Data Reviewed: I have personally reviewed following labs and imaging studies  CBC: Recent Labs  Lab 11/27/20 1328 11/28/20 0254 11/28/20 1430 11/29/20 0905 11/30/20 0303 12/01/20 0302  WBC 5.2 2.7*  --  5.2 5.8 4.5  NEUTROABS 5.0 2.5  --  4.6  --   --   HGB 8.6* 6.6* 9.6* 10.3* 9.9* 9.8*  HCT 28.2* 20.5* 28.7* 30.2* 29.7* 30.6*  MCV 80.3 76.2*  --  75.1* 76.5* 79.3*  PLT 161 124*  --  114* 101* 107*     Basic Metabolic Panel: Recent Labs  Lab 11/27/20 1927 11/28/20 0254 11/29/20 0905 11/30/20 0303 12/01/20 0302  NA 133* 137 135 137 136  K 4.7 4.2 3.3* 2.8* 3.2*  CL 107 111 101 99 104  CO2 15* 16* 21* 28 23  GLUCOSE 130* 81 61* 70 146*  BUN 75* 80* 77* 72* 59*  CREATININE 2.51* 2.55* 2.26* 2.20* 1.91*  CALCIUM 8.0* 8.0* 7.6* 7.7* 7.6*  MG 2.1 2.2 2.0 1.7 2.5*  PHOS 5.7* 5.7* 4.1 3.4  --      GFR: Estimated Creatinine Clearance: 29.3 mL/min (A) (by C-G formula based on SCr of 1.91 mg/dL (H)).  Liver Function Tests: Recent Labs  Lab 11/27/20 1328 11/28/20 0254 11/29/20 0905 11/30/20 0303 12/01/20 0302  AST 64* 99* 150* 140* 103*  ALT 32 34 45* 45* 41  ALKPHOS 92 75 73 72 63  BILITOT 1.1 0.8 0.8 0.5 0.6  PROT 5.4* 5.4* 5.2* 5.0* 4.8*  ALBUMIN 2.6* 3.0* 2.7* 2.6* 2.3*      CBG: Recent Labs  Lab 11/29/20 1819 11/30/20 0045 11/30/20 1148 11/30/20 2314 12/01/20 0608  GLUCAP 83 75 82 128* 121*      Recent Results (from the past 240 hour(s))  Blood Culture (routine x 2)     Status: None (Preliminary result)   Collection Time: 11/27/20  1:28 PM   Specimen: BLOOD  Result Value Ref Range Status   Specimen Description   Final    BLOOD ARM Performed at [facility], [place] [address] [place], [place] [address]    Special Requests   Final    BOTTLES DRAWN AEROBIC AND ANAEROBIC Blood Culture results may not be optimal due to an inadequate volume of blood received in culture bottles Performed at [place] [address] [place], [place] [address]    Culture   Final    NO GROWTH 4 DAYS Performed at [facility], [place] [address] [place], [place] [address]    Report Status PENDING  Incomplete  Blood Culture (routine x 2)     Status: Abnormal   Collection Time: 11/27/20  1:44 PM   Specimen: BLOOD  Result Value Ref Range Status   Specimen Description   Final    BLOOD ARM LEFT Performed at [place] [address], [place], [place] [address]    Special Requests   Final    BOTTLES DRAWN AEROBIC AND ANAEROBIC Blood Culture results may not be optimal due to an inadequate volume of blood received in culture bottles Performed at [place] [address], [place], [place] [address]    Culture  Setup Time (A)  Final    GRAM VARIABLE ROD ANAEROBIC BOTTLE ONLY CRITICAL RESULT CALLED TO, READ BACK BY AND VERIFIED WITHGuadlupe Spanish PHARMD [id number] 11/28/20 A BROWNING Performed at [facility], [place] [address], [place], [place] [address]    Culture (A)  Final  DIPHTHEROIDS(CORYNEBACTERIUM SPECIES) Standardized susceptibility testing for this organism is not available. CLOSTRIDIUM PERFRINGENS    Report Status 12/01/2020 FINAL  Final  Blood Culture ID Panel  (Reflexed)     Status: None   Collection Time: 11/27/20  1:44 PM  Result Value Ref Range Status   Enterococcus faecalis NOT DETECTED NOT DETECTED Final   Enterococcus Faecium NOT DETECTED NOT DETECTED Final   Listeria monocytogenes NOT DETECTED NOT DETECTED Final   Staphylococcus species NOT DETECTED NOT DETECTED Final   Staphylococcus aureus (BCID) NOT DETECTED NOT DETECTED Final   Staphylococcus epidermidis NOT DETECTED NOT DETECTED Final   Staphylococcus lugdunensis NOT DETECTED NOT DETECTED Final   Streptococcus species NOT DETECTED NOT DETECTED Final   Streptococcus agalactiae NOT DETECTED NOT DETECTED Final   Streptococcus pneumoniae NOT DETECTED NOT DETECTED Final   Streptococcus pyogenes NOT DETECTED NOT DETECTED Final   A.calcoaceticus-baumannii NOT DETECTED NOT DETECTED Final   Bacteroides fragilis NOT DETECTED NOT DETECTED Final   Enterobacterales NOT DETECTED NOT DETECTED Final   Enterobacter cloacae complex NOT DETECTED NOT DETECTED Final   Escherichia coli NOT DETECTED NOT DETECTED Final   Klebsiella aerogenes NOT DETECTED NOT DETECTED Final   Klebsiella oxytoca NOT DETECTED NOT DETECTED Final   Klebsiella pneumoniae NOT DETECTED NOT DETECTED Final   Proteus species NOT DETECTED NOT DETECTED Final   Salmonella species NOT DETECTED NOT DETECTED Final   Serratia marcescens NOT DETECTED NOT DETECTED Final   Haemophilus influenzae NOT DETECTED NOT DETECTED Final   Neisseria meningitidis NOT DETECTED NOT DETECTED Final   Pseudomonas aeruginosa NOT DETECTED NOT DETECTED Final   Stenotrophomonas maltophilia NOT DETECTED NOT DETECTED Final   Candida albicans NOT DETECTED NOT DETECTED Final   Candida auris NOT DETECTED NOT DETECTED Final   Candida glabrata NOT DETECTED NOT DETECTED Final   Candida krusei NOT DETECTED NOT DETECTED Final   Candida parapsilosis NOT DETECTED NOT DETECTED Final   Candida tropicalis NOT DETECTED NOT DETECTED Final   Cryptococcus neoformans/gattii  NOT DETECTED NOT DETECTED Final    Comment: Performed at [facility], [address] [place], [place] [address]  Resp Panel by RT-PCR (Flu A&B, Covid) Nasopharyngeal Swab     Status: None   Collection Time: 11/27/20  1:46 PM   Specimen: Nasopharyngeal Swab; Nasopharyngeal(NP) swabs in vial transport medium  Result Value Ref Range Status   SARS Coronavirus 2 by RT PCR NEGATIVE NEGATIVE Final    Comment: (NOTE) SARS-CoV-2 target nucleic acids are NOT DETECTED.  The SARS-CoV-2 RNA is generally detectable in upper respiratory specimens during the acute phase of infection. The lowest concentration of SARS-CoV-2 viral copies this assay can detect is 138 copies/mL. A negative result does not preclude SARS-Cov-2 infection and should not be used as the sole basis for treatment or other patient management decisions. A negative result may occur with  improper specimen collection/handling, submission of specimen other than nasopharyngeal swab, presence of viral mutation(s) within the areas targeted by this assay, and inadequate number of viral copies(<138 copies/mL). A negative result must be combined with clinical observations, patient history, and epidemiological information. The expected result is Negative.  Fact Sheet for Patients:  EntrepreneurPulse.com.au  Fact Sheet for Healthcare Providers:  IncredibleEmployment.be  This test is no t yet approved or cleared by the [place] FDA and  has been authorized for detection and/or diagnosis of SARS-CoV-2 by FDA under an Emergency Use Authorization (EUA). This EUA will remain  in effect (meaning this test can be used) for  the duration of the COVID-19 declaration under Section 564(b)(1) of the Act, 21 U.S.C.section 360bbb-3(b)(1), unless the authorization is terminated  or revoked sooner.       Influenza A by PCR NEGATIVE NEGATIVE Final   Influenza B by PCR NEGATIVE NEGATIVE Final    Comment:  (NOTE) The Xpert Xpress SARS-CoV-2/FLU/RSV plus assay is intended as an aid in the diagnosis of influenza from Nasopharyngeal swab specimens and should not be used as a sole basis for treatment. Nasal washings and aspirates are unacceptable for Xpert Xpress SARS-CoV-2/FLU/RSV testing.  Fact Sheet for Patients: EntrepreneurPulse.com.au  Fact Sheet for Healthcare Providers: IncredibleEmployment.be  This test is not yet approved or cleared by the [place] FDA and has been authorized for detection and/or diagnosis of SARS-CoV-2 by FDA under an Emergency Use Authorization (EUA). This EUA will remain in effect (meaning this test can be used) for the duration of the COVID-19 declaration under Section 564(b)(1) of the Act, 21 U.S.C. section 360bbb-3(b)(1), unless the authorization is terminated or revoked.  Performed at [facility], [place] [address], [place], [place] [address]   Urine Culture     Status: Abnormal   Collection Time: 11/27/20  1:48 PM   Specimen: Urine, Catheterized  Result Value Ref Range Status   Specimen Description   Final    URINE, CATHETERIZED Performed at [place] [address] [place], [place] [address]    Special Requests   Final    NONE Performed at [facility], [place] [address], [place], [place] [address]    Culture (A)  Final    >=100,000 COLONIES/mL AEROCOCCUS SPECIES Standardized susceptibility testing for this organism is not available. Performed at [facility], [place] [address], [place], [place] [address]    Report Status 11/29/2020 FINAL  Final  Gastrointestinal Panel by PCR , Stool     Status: None   Collection Time: 11/27/20  7:31 PM   Specimen: Stool  Result Value Ref Range Status   Campylobacter species NOT DETECTED NOT DETECTED Final   Plesimonas shigelloides NOT DETECTED NOT DETECTED Final   Salmonella species NOT DETECTED NOT  DETECTED Final   Yersinia enterocolitica NOT DETECTED NOT DETECTED Final   Vibrio species NOT DETECTED NOT DETECTED Final   Vibrio cholerae NOT DETECTED NOT DETECTED Final   Enteroaggregative E coli (EAEC) NOT DETECTED NOT DETECTED Final   Enteropathogenic E coli (EPEC) NOT DETECTED NOT DETECTED Final   Enterotoxigenic E coli (ETEC) NOT DETECTED NOT DETECTED Final   Shiga like toxin producing E coli (STEC) NOT DETECTED NOT DETECTED Final   Shigella/Enteroinvasive E coli (EIEC) NOT DETECTED NOT DETECTED Final   Cryptosporidium NOT DETECTED NOT DETECTED Final   Cyclospora cayetanensis NOT DETECTED NOT DETECTED Final   Entamoeba histolytica NOT DETECTED NOT DETECTED Final   Giardia lamblia NOT DETECTED NOT DETECTED Final   Adenovirus F40/41 NOT DETECTED NOT DETECTED Final   Astrovirus NOT DETECTED NOT DETECTED Final   Norovirus GI/GII NOT DETECTED NOT DETECTED Final   Rotavirus A NOT DETECTED NOT DETECTED Final   Sapovirus (I, II, IV, and V) NOT DETECTED NOT DETECTED Final    Comment: Performed at [facility], [address] [place], [place] [address]  C Difficile Quick Screen w PCR reflex     Status: None   Collection Time: 11/27/20  7:32 PM   Specimen: STOOL  Result Value Ref Range Status   C Diff antigen NEGATIVE NEGATIVE Final   C Diff  toxin NEGATIVE NEGATIVE Final   C Diff interpretation No C. difficile detected.  Final    Comment: Performed at [facility], [place] [address], [place], [place] [address]  MRSA Next Gen by PCR, Nasal     Status: None   Collection Time: 11/29/20  5:25 AM   Specimen: Nasal Mucosa; Nasal Swab  Result Value Ref Range Status   MRSA by PCR Next Gen NOT DETECTED NOT DETECTED Final    Comment: (NOTE) The GeneXpert MRSA Assay (FDA approved for NASAL specimens only), is one component of a comprehensive MRSA colonization surveillance program. It is not intended to diagnose MRSA infection nor to guide or monitor treatment for MRSA  infections. Test performance is not FDA approved in patients less than 53 years old. Performed at [facility], [place] [address] [place], [place] [address]           Radiology Studies: US Abdomen Complete  Result Date: 11/30/2020 CLINICAL DATA:  Transaminitis. EXAM: ABDOMEN ULTRASOUND COMPLETE COMPARISON:  CT of the abdomen and pelvis on 08/17/2019 FINDINGS: Gallbladder: Gallbladder wall is UPPER normal, 3 millimeters. There is no sonographic Murphy's sign. In the gallbladder lumen, there is layering sludge without stones. Complex pericholecystic fluid collection noted. Common bile duct: Diameter: 5.0 millimeters Liver: Visualized portions of the liver are unremarkable. LEFT hepatic lobe is difficult to visualized secondary to overlying bowel gas. Portal vein is patent on color Doppler imaging with normal direction of blood flow towards the liver. IVC: Not well seen Pancreas: Partially imaged.  No abnormality identified. Spleen: Not well seen. Right Kidney: Length: Not well seen. Left Kidney: Length: Not well seen. Abdominal aorta: Abdominal aortic aneurysm measures at least 3.2 centimeters. Mural thrombus again noted. Other findings: Study quality is degraded by immobility, difficulty holding breath, and overlying bowel gas. IMPRESSION: 1. Layering sludge within the gallbladder. 2. Complex small pericholecystic fluid collection noted, of uncertain significance. 3. Limited evaluation of the LEFT hepatic lobe, kidneys, pancreas, spleen, IVC. 4. 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 Electronically Signed   By: Nolon Nations M.D.   On: 11/30/2020 17:53        Scheduled Meds:  chlorhexidine  15 mL Mouth Rinse BID   Chlorhexidine Gluconate Cloth  6 each Topical Daily   collagenase   Topical Daily   feeding supplement  1 Container Oral TID BM   feeding supplement  237 mL Oral Q24H   ferrous sulfate  325 mg  Oral Q breakfast   finasteride  5 mg Oral Daily   folic acid  1 mg Oral Daily   mouth rinse  15 mL Mouth Rinse q12n4p   multivitamin with minerals  1 tablet Oral Daily   nutrition supplement (JUVEN)  1 packet Oral BID BM   pantoprazole (PROTONIX) IV  40 mg Intravenous Q12H   potassium chloride  40 mEq Oral Q4H   sodium chloride flush  3 mL Intravenous Q12H   umeclidinium bromide  1 puff Inhalation Daily   Continuous Infusions:  sodium chloride 10 mL/hr at 12/01/20 0600   dextrose 5 % and 0.9% NaCl 100 mL/hr at 12/01/20 0600   vancomycin Stopped (11/30/20 1123)     LOS: 4 days    Time spent: 45 minutes    Irine Seal, MD [facility]   To contact the attending provider between 7A-7P or the covering provider during after hours 7P-7A, please log into the web site [email or web address] and access  using universal [place] password for that web site. If you do not have the password, please call the hospital operator.  12/01/2020, 9:01 AM

## 2020-12-01 NOTE — Telephone Encounter (Signed)
He is currently admitted and they will likely place him on discharge if needed.

## 2020-12-01 NOTE — Progress Notes (Signed)
Daily Progress Note   Patient Name: Henry Carter       Date: 12/01/2020 DOB: 1941/03/13  Age: 80 y.o. MRN#: [id number] Attending Physician: Eugenie Filler, MD Primary Care Physician: [name], MD Admit Date: 11/27/2020  Reason for Consultation/Follow-up: Establishing goals of care  Subjective: Patient is resting in bed, appears with generalized weakness, Bair hugger is on, chart reviewed, discussed with [facility] MD, PO intake remains good,wife arrived to the unit as I was leaving, see below.    Length of Stay: 4  Current Medications: Scheduled Meds:  . amoxicillin  500 mg Oral Q12H  . chlorhexidine  15 mL Mouth Rinse BID  . Chlorhexidine Gluconate Cloth  6 each Topical Daily  . collagenase   Topical Daily  . feeding supplement  1 Container Oral TID BM  . feeding supplement  237 mL Oral Q24H  . ferrous sulfate  325 mg Oral Q breakfast  . finasteride  5 mg Oral Daily  . folic acid  1 mg Oral Daily  . heparin injection (subcutaneous)  5,000 Units Subcutaneous Q8H  . mouth rinse  15 mL Mouth Rinse q12n4p  . multivitamin with minerals  1 tablet Oral Daily  . nutrition supplement (JUVEN)  1 packet Oral BID BM  . pantoprazole (PROTONIX) IV  40 mg Intravenous Q12H  . sodium chloride flush  3 mL Intravenous Q12H  . umeclidinium bromide  1 puff Inhalation Daily    Continuous Infusions: . sodium chloride 10 mL/hr at 12/01/20 1314  . dextrose 5 % and 0.9% NaCl 75 mL/hr at 12/01/20 1314    PRN Meds: sodium chloride, acetaminophen **OR** acetaminophen, albuterol, ipratropium, loperamide, metoprolol tartrate, ondansetron **OR** ondansetron (ZOFRAN) IV, senna-docusate, sorbitol  Physical Exam         Frail and cachectic appearing gentleman Has a bair hugger on Monitor  noted Has upper and lower extremity edema left hand swollen Petechiae feet and lower extremities Noted to have bilateral hip wounds Mood and affect within normal limits, displays fairly intact judgment and insight and is able to participate in conversations  Vital Signs: BP 106/70   Pulse 94   Temp (!) 97.5 F (36.4 C)   Resp (!) 9   Ht '5\' 11"'$  (1.803 m)   Wt 66 kg   SpO2 93%   BMI 20.29  kg/m  SpO2: SpO2: 93 % O2 Device: O2 Device: Room Air O2 Flow Rate: O2 Flow Rate (L/min): 2 L/min  Intake/output summary:  Intake/Output Summary (Last 24 hours) at 12/01/2020 1404 Last data filed at 12/01/2020 1314 Gross per 24 hour  Intake 3819.78 ml  Output 1850 ml  Net 1969.78 ml    LBM: Last BM Date: 11/30/20 Baseline Weight: Weight: 65.8 kg Most recent weight: Weight: 66 kg      Palliative performance scale 50%. Palliative Assessment/Data:      Patient Active Problem List   Diagnosis Date Noted  . Bacteremia 12/01/2020  . Palliative care by specialist   . Goals of care, counseling/discussion   . General weakness   . Right leg DVT ([place]) 11/29/2020  . Pancytopenia ([place]) 11/29/2020  . Bilateral hydronephrosis 11/28/2020  . Severe sepsis with acute organ dysfunction ([place]) 11/27/2020  . Complicated UTI (urinary tract infection) 11/27/2020  . Transaminitis 11/27/2020  . Severe protein-calorie malnutrition ([place]) 11/27/2020  . Bilateral edema of lower extremity 11/27/2020  . Hyperglycemia 11/27/2020  . Dehydration 11/27/2020  . Hypothermia 11/27/2020  . 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 Abdominal pain 08/17/2019  . Chronic indwelling Foley catheter 06/29/2019  . History of DVT (deep vein thrombosis) 06/29/2019  . Obstruction of Foley catheter ([place]) 06/29/2019  . Acute bilateral obstructive uropathy   . Hypokalemia   . Acute metabolic encephalopathy   . Hyponatremia   . Hematuria 06/09/2019  . ARF (acute renal failure) ([place]) 06/09/2019  . Acute blood loss anemia  06/09/2019  . Hyperkalemia 06/09/2019  . Metabolic acidosis 99991111  . Left leg DVT ([place]) 05/18/2019  . Left leg swelling 05/11/2019  . Pre-operative cardiovascular examination 03/22/2019  . 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[place]) 07/11/2018  . Right arm weakness 07/21/2017  . Non compliance w medication regimen 07/30/2016  . 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 Mild cognitive impairment 12/11/2015  . 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 Healthcare maintenance 02/07/2013  . Iron deficiency anemia due to chronic blood loss 01/18/2013  . Diarrhea 09/22/2012  . C O P D 04/08/2010  . Atherosclerosis of native artery of extremity with intermittent claudication ([place]) 11/26/2008  . Benign prostatic hyperplasia 10/20/2007  . PERIPHERAL VASCULAR DISEASE 05/19/2007  . Essential hypertension 02/01/2007  . Hyperlipidemia 01/03/2007    Palliative Care Assessment & Plan   Patient Profile:    Assessment: 80 year old gentleman brought from home, lives with wife, admitted with urinary retention, acute renal failure and urosepsis.  Patient remains admitted to hospital medicine service for receiving broad-spectrum antibiotics.  He presented with severe sepsis criteria with organ dysfunction.  Was found to have hypothermia and hypotension and acute renal failure.  Also found to have acute right lower extremity DVT and metabolic acidosis.  Also noted to have bilateral hydronephrosis.  Recommendations/Plan: DNR DNI Continue current mode of care SNF rehab with palliative if feasible if patient is not improving, would re initiate goals of care discussions with patient and wife regarding comfort measures/hospice.  PMT to follow.     Goals of Care and Additional Recommendations: Limitations on Scope of Treatment:    Code Status:    Code Status Orders  (From admission, onward)           Start     Ordered   11/30/20 1453  Do not attempt resuscitation (DNR)  Continuous        Question Answer Comment  In the event of cardiac or respiratory ARREST Do  not call a "code blue"   In the event of cardiac or respiratory ARREST Do not perform Intubation, CPR, defibrillation or ACLS   In the event of cardiac or respiratory ARREST Use medication by any route, position, wound care, and other measures to relive pain and suffering. May use oxygen, suction and manual treatment of airway obstruction as needed for comfort.      11/30/20 1452           Code Status History     Date Active Date Inactive Code Status Order ID Comments User Context   11/27/2020 1829 11/30/2020 1452 Full Code [id number]  Eugenie Filler, MD Inpatient   06/29/2019 1859 07/07/2019 0021 Full Code [id number]  Lenore Cordia, MD ED   06/09/2019 2105 06/15/2019 1941 Full Code [id number]  Shela Leff, MD ED      Advance Directive Documentation    Flowsheet Row Most Recent Value  Type of Advance Directive Healthcare Power of Attorney, Living will  Pre-existing out of facility DNR order (yellow form or pink MOST form) --  "MOST" Form in Place? --       Prognosis:  Unable to determine  Discharge Planning: To Be Determined  Care plan was discussed with  patient and wife.   Thank you for allowing the Palliative Medicine Team to assist in the care of this patient.   Time In: 12 Time Out: 12.25 Total Time 25 Prolonged Time Billed  no       Greater than 50%  of this time was spent counseling and coordinating care related to the above assessment and plan.  Loistine Chance, MD  Please contact Palliative Medicine Team phone at [phone] for questions and concerns.

## 2020-12-01 NOTE — Consult Note (Signed)
[facility]    Date of Admission:  11/27/2020     Reason for Consult: severe sepsis/septic shock. Clostridium perfringens    Referring Provider: Thompson     Abx: 8/18-c vanc  8/19-21 ceftriaxone 8/18-21 metronidazole 8/18 aztreonam        Assessment: Severe sepsis/septic shock Clostridium perfringens bacteremia Aeroccoccus bacteriuria/complicated uti Diarrhea  Aki resolving Lft elevation resolving Thrombocytopenia resolving  80 yo male pmh htn/hlp, pvd, gerd, dvt s/p ivc filter 3/1/221 (dvt dx 04/2019 on xarelto; 06/2019 severe sepsis/uti-hematuria prompting ivc filter placement and cessation xarelto), and chronic urinary retention/bilateral hydronephrosis, admitted with severe sepsis/septic shock with hypothermia/aki in the setting of failure to thrive/generalized weakness and diarrhea, found to have clostridium perfringens bacteremia and complicated uti  [id number] bcx 1 of 2 set clostridium perfringens anaerobic bottle only 8/18 urine cultures aerococcus species 8/18 stool pcr negative; cdiff screen negative   Potentially 2 sources of sepsis include GU and GI although he recalled to me mainly GI sx. Both have a feasible clinical history explanation with his diarrhea and chronic urinary retention.   Patient has pcn allergy but do not recall when or even if he has it. Doesn't recall any anaphylaxis episode in his life   Has ivc filter/aorta mural thrombus, but no strong evidence this would requires longer treatment from c perfringen bsi  Plan: Penicillin/amoxicillin graded challenge  If tolerate, would change antibiotics to amoxicillin 1 gram twice daily Total abx duration 10 days from 8/22 Discussed with primary team  I spent 60 minute reviewing data/chart, and coordinating care and >50% direct face to face time providing counseling/discussing diagnostics/treatment plan with  patient      ------------------------------------------------ Principal Problem:   Severe sepsis with acute organ dysfunction ([place]) Active Problems:   Hyperlipidemia   Essential hypertension   PERIPHERAL VASCULAR DISEASE   Benign prostatic hyperplasia   Diarrhea   Iron deficiency anemia due to chronic blood loss   Acute renal failure (ARF) (HCC)   Hyperkalemia   Metabolic acidosis   Chronic indwelling Foley catheter   Acute lower UTI   Transaminitis   Severe protein-calorie malnutrition (HCC)   Bilateral edema of lower extremity   Hyperglycemia   Dehydration   Hypothermia   Bilateral hydronephrosis   Right leg DVT ([place])   Pancytopenia ([place])   Palliative care by specialist   Goals of care, counseling/discussion   General weakness   Bacteremia    HPI: Henry Carter is a 80 y.o. male pmh htn/hlp, pvd, gerd, dvt s/p ivc filter 3/1/221 (dvt dx 04/2019 on xarelto; 06/2019 severe sepsis/uti-hematuria prompting ivc filter placement and cessation xarelto), and chronic urinary retention/bilateral hydronephrosis, admitted with severe sepsis/septic shock with hypothermia/aki in the setting of failure to thrive/generalized weakness and diarrhea, found to have clostridium perfringens bacteremia and complicated uti   Patient is not a good historian, but his story seems consistent with other H&P this admission  He reports 3-4 days fatigue, generalized weakness, and profuse watery diarrhea. Wife called pcp who called ems to bring him to ED. When found, patient was covered in feces. Wife expressed inability to care for him  On presentation patient hypothermic, septic shock requiring icu care. Had aki/lft elevation/thrombocytopenia. Started on vanc/aztreonam/flagyl for concern of chart hx pcn allergy  Urine cx aerococcus species. Stool pcr negative; cdiff screen negative. Bcx clostridium perfringens  Patient transitioned to vanc alone today as he had improved much the last 24-48 hours  and cultures  fairly finalized  He is eating well No abd pain No joint pain No rash No headache No cough/chest pain No sob Diarrhea resolved, now soft stool  Doesn't recall dysuria/hematuria/flank pain  Urology also evaluated this admission due to hx urinary retention/hydronephrosis. Renal u/s with moderate left sided hydronephrosis. He has a foley placed.    Family History  Problem Relation Age of Onset   Alcohol abuse Father    Kidney disease Brother    Drug abuse Brother    Diabetes Brother    Diabetes Mother    Diabetes Brother    Colon cancer Neg Hx     Social History   Tobacco Use   Smoking status: Former    Packs/day: 0.25    Years: 50.00    Pack years: 12.50    Types: Cigarettes    Start date: 04/12/1957    Quit date: 04/12/2014    Years since quitting: 6.6   Smokeless tobacco: Never   Tobacco comments:    using chatix; stopped now; took x 1 week / had stopped 3 years prior to starting back in 2005  Vaping Use   Vaping Use: Never used  Substance Use Topics   Alcohol use: No    Alcohol/week: 0.0 standard drinks    Comment: OCC   Drug use: Yes    Comment: States marijuanna relaxes him;     Allergies  Allergen Reactions   Ace Inhibitors     REACTION: angioedema   Penicillins Hives    Has patient had a PCN reaction causing immediate rash, facial/tongue/throat swelling, SOB or lightheadedness with hypotension: No Has patient had a PCN reaction causing severe rash involving mucus membranes or skin necrosis: No Has patient had a PCN reaction that required hospitalization yes Has patient had a PCN reaction occurring within the last 10 years: no If all of the above answers are "NO", then may proceed with Cephalosporin use.    Review of Systems: ROS All Other ROS was negative, except mentioned above   Past Medical History:  Diagnosis Date    iron deficiency 01/18/2013   Angioedema    BPH (benign prostatic hyperplasia)    COPD (chronic obstructive  pulmonary disease) (HCC)    STates he has copd   Essential hypertension    GERD (gastroesophageal reflux disease)    Helicobacter pylori gastritis 10/10/2012   Hyperlipidemia    IBS (irritable bowel syndrome)    Impotence    Peripheral vascular disease, unspecified (HCC)    Personal history of colonic adenoma 09/22/2012   Personal history of tobacco use, presenting hazards to health        Scheduled Meds:  chlorhexidine  15 mL Mouth Rinse BID   Chlorhexidine Gluconate Cloth  6 each Topical Daily   collagenase   Topical Daily   feeding supplement  1 Container Oral TID BM   feeding supplement  237 mL Oral Q24H   ferrous sulfate  325 mg Oral Q breakfast   finasteride  5 mg Oral Daily   folic acid  1 mg Oral Daily   heparin injection (subcutaneous)  5,000 Units Subcutaneous Q8H   mouth rinse  15 mL Mouth Rinse q12n4p   multivitamin with minerals  1 tablet Oral Daily   nutrition supplement (JUVEN)  1 packet Oral BID BM   pantoprazole (PROTONIX) IV  40 mg Intravenous Q12H   potassium chloride  40 mEq Oral Q4H   sodium chloride flush  3 mL Intravenous Q12H   umeclidinium bromide  1 puff Inhalation Daily   Continuous Infusions:  sodium chloride 10 mL/hr at 12/01/20 0900   dextrose 5 % and 0.9% NaCl     vancomycin Stopped (11/30/20 1123)   PRN Meds:.sodium chloride, acetaminophen **OR** acetaminophen, albuterol, ipratropium, loperamide, metoprolol tartrate, ondansetron **OR** ondansetron (ZOFRAN) IV, senna-docusate, sorbitol   OBJECTIVE: Blood pressure (!) 145/85, pulse 70, temperature (!) 97.3 F (36.3 C), temperature source Bladder, resp. rate (!) 6, height '5\' 11"'$  (1.803 m), weight 66 kg, SpO2 97 %.  Physical Exam  General/constitutional: no distress, pleasant, appears weak. Thin. Conversant  HEENT: Normocephalic, PER, Conj Clear, EOMI, Oropharynx clear Neck supple CV: rrr no mrg Lungs: clear to auscultation, normal respiratory effort Abd: Soft, Nontender Ext: trace  bilateral LE edema Skin: No Rash Neuro: generalized weakness, slight rigidity bilateral LE ext; no clonus MSK: no peripheral joint swelling/tenderness/warmth; back spines nontender     Lab Results Lab Results  Component Value Date   WBC 4.5 12/01/2020   HGB 9.8 (L) 12/01/2020   HCT 30.6 (L) 12/01/2020   MCV 79.3 (L) 12/01/2020   PLT 107 (L) 12/01/2020    Lab Results  Component Value Date   CREATININE 1.91 (H) 12/01/2020   BUN 59 (H) 12/01/2020   NA 136 12/01/2020   K 3.2 (L) 12/01/2020   CL 104 12/01/2020   CO2 23 12/01/2020    Lab Results  Component Value Date   ALT 41 12/01/2020   AST 103 (H) 12/01/2020   ALKPHOS 63 12/01/2020   BILITOT 0.6 12/01/2020      Microbiology: Recent Results (from the past 240 hour(s))  Blood Culture (routine x 2)     Status: None (Preliminary result)   Collection Time: 11/27/20  1:28 PM   Specimen: BLOOD  Result Value Ref Range Status   Specimen Description   Final    BLOOD ARM Performed at [facility] [address], [place], [place] [address]    Special Requests   Final    BOTTLES DRAWN AEROBIC AND ANAEROBIC Blood Culture results may not be optimal due to an inadequate volume of blood received in culture bottles Performed at [facility] [address] [place], [place] [address]    Culture   Final    NO GROWTH 4 DAYS Performed at [facility], [place] [address] [place], [place] [address]    Report Status PENDING  Incomplete  Blood Culture (routine x 2)     Status: Abnormal   Collection Time: 11/27/20  1:44 PM   Specimen: BLOOD  Result Value Ref Range Status   Specimen Description   Final    BLOOD ARM LEFT Performed at [facility] [address], [place], [place] [address]    Special Requests   Final    BOTTLES DRAWN AEROBIC AND ANAEROBIC Blood Culture results may not be optimal due to an inadequate volume of blood received in culture bottles Performed at  [facility] [address], [place], [place] [address]    Culture  Setup Time (A)  Final    GRAM VARIABLE ROD ANAEROBIC BOTTLE ONLY CRITICAL RESULT CALLED TO, READ BACK BY AND VERIFIED WITHGuadlupe Spanish PHARMD [id number] 11/28/20 A BROWNING Performed at [facility], [place] [address] [place],  [address]    Culture (A)  Final    DIPHTHEROIDS(CORYNEBACTERIUM SPECIES) Standardized susceptibility testing for this organism is not available. CLOSTRIDIUM PERFRINGENS    Report Status 12/01/2020 FINAL  Final  Blood Culture ID Panel (Reflexed)     Status: None   Collection Time: 11/27/20  1:44 PM  Result Value Ref Range Status   Enterococcus faecalis NOT DETECTED NOT DETECTED Final   Enterococcus Faecium NOT DETECTED NOT DETECTED Final   Listeria monocytogenes NOT DETECTED NOT DETECTED Final   Staphylococcus species NOT DETECTED NOT DETECTED Final   Staphylococcus aureus (BCID) NOT DETECTED NOT DETECTED Final   Staphylococcus epidermidis NOT DETECTED NOT DETECTED Final   Staphylococcus lugdunensis NOT DETECTED NOT DETECTED Final   Streptococcus species NOT DETECTED NOT DETECTED Final   Streptococcus agalactiae NOT DETECTED NOT DETECTED Final   Streptococcus pneumoniae NOT DETECTED NOT DETECTED Final   Streptococcus pyogenes NOT DETECTED NOT DETECTED Final   A.calcoaceticus-baumannii NOT DETECTED NOT DETECTED Final   Bacteroides fragilis NOT DETECTED NOT DETECTED Final   Enterobacterales NOT DETECTED NOT DETECTED Final   Enterobacter cloacae complex NOT DETECTED NOT DETECTED Final   Escherichia coli NOT DETECTED NOT DETECTED Final   Klebsiella aerogenes NOT DETECTED NOT DETECTED Final   Klebsiella oxytoca NOT DETECTED NOT DETECTED Final   Klebsiella pneumoniae NOT DETECTED NOT DETECTED Final   Proteus species NOT DETECTED NOT DETECTED Final   Salmonella species NOT DETECTED NOT DETECTED Final   Serratia marcescens NOT DETECTED NOT DETECTED Final   Haemophilus  influenzae NOT DETECTED NOT DETECTED Final   Neisseria meningitidis NOT DETECTED NOT DETECTED Final   Pseudomonas aeruginosa NOT DETECTED NOT DETECTED Final   Stenotrophomonas maltophilia NOT DETECTED NOT DETECTED Final   Candida albicans NOT DETECTED NOT DETECTED Final   Candida auris NOT DETECTED NOT DETECTED Final   Candida glabrata NOT DETECTED NOT DETECTED Final   Candida krusei NOT DETECTED NOT DETECTED Final   Candida parapsilosis NOT DETECTED NOT DETECTED Final   Candida tropicalis NOT DETECTED NOT DETECTED Final   Cryptococcus neoformans/gattii NOT DETECTED NOT DETECTED Final    Comment: Performed at [facility], [address] [place], [place] [address]  Resp Panel by RT-PCR (Flu A&B, Covid) Nasopharyngeal Swab     Status: None   Collection Time: 11/27/20  1:46 PM   Specimen: Nasopharyngeal Swab; Nasopharyngeal(NP) swabs in vial transport medium  Result Value Ref Range Status   SARS Coronavirus 2 by RT PCR NEGATIVE NEGATIVE Final    Comment: (NOTE) SARS-CoV-2 target nucleic acids are NOT DETECTED.  The SARS-CoV-2 RNA is generally detectable in upper respiratory specimens during the acute phase of infection. The lowest concentration of SARS-CoV-2 viral copies this assay can detect is 138 copies/mL. A negative result does not preclude SARS-Cov-2 infection and should not be used as the sole basis for treatment or other patient management decisions. A negative result may occur with  improper specimen collection/handling, submission of specimen other than nasopharyngeal swab, presence of viral mutation(s) within the areas targeted by this assay, and inadequate number of viral copies(<138 copies/mL). A negative result must be combined with clinical observations, patient history, and epidemiological information. The expected result is Negative.  Fact Sheet for Patients:  EntrepreneurPulse.com.au  Fact Sheet for Healthcare Providers:   IncredibleEmployment.be  This test is no t yet approved or cleared by the [place] FDA and  has been authorized for detection and/or diagnosis of SARS-CoV-2 by FDA under an Emergency Use Authorization (EUA). This EUA will remain  in effect (meaning this test can be used) for the duration of the COVID-19 declaration under Section 564(b)(1) of the Act, 21 U.S.C.section 360bbb-3(b)(1), unless the authorization is terminated  or revoked  sooner.       Influenza A by PCR NEGATIVE NEGATIVE Final   Influenza B by PCR NEGATIVE NEGATIVE Final    Comment: (NOTE) The Xpert Xpress SARS-CoV-2/FLU/RSV plus assay is intended as an aid in the diagnosis of influenza from Nasopharyngeal swab specimens and should not be used as a sole basis for treatment. Nasal washings and aspirates are unacceptable for Xpert Xpress SARS-CoV-2/FLU/RSV testing.  Fact Sheet for Patients: EntrepreneurPulse.com.au  Fact Sheet for Healthcare Providers: IncredibleEmployment.be  This test is not yet approved or cleared by the [place] FDA and has been authorized for detection and/or diagnosis of SARS-CoV-2 by FDA under an Emergency Use Authorization (EUA). This EUA will remain in effect (meaning this test can be used) for the duration of the COVID-19 declaration under Section 564(b)(1) of the Act, 21 U.S.C. section 360bbb-3(b)(1), unless the authorization is terminated or revoked.  Performed at [facility], [place] [address], [place], [place] [address]   Urine Culture     Status: Abnormal   Collection Time: 11/27/20  1:48 PM   Specimen: Urine, Catheterized  Result Value Ref Range Status   Specimen Description   Final    URINE, CATHETERIZED Performed at [facility] [address] [place], [place] [address]    Special Requests   Final    NONE Performed at [facility], [place] [address]  [place], [place] [address]    Culture (A)  Final    >=100,000 COLONIES/mL AEROCOCCUS SPECIES Standardized susceptibility testing for this organism is not available. Performed at [facility], [place] [address], [place], [place] [address]    Report Status 11/29/2020 FINAL  Final  Gastrointestinal Panel by PCR , Stool     Status: None   Collection Time: 11/27/20  7:31 PM   Specimen: Stool  Result Value Ref Range Status   Campylobacter species NOT DETECTED NOT DETECTED Final   Plesimonas shigelloides NOT DETECTED NOT DETECTED Final   Salmonella species NOT DETECTED NOT DETECTED Final   Yersinia enterocolitica NOT DETECTED NOT DETECTED Final   Vibrio species NOT DETECTED NOT DETECTED Final   Vibrio cholerae NOT DETECTED NOT DETECTED Final   Enteroaggregative E coli (EAEC) NOT DETECTED NOT DETECTED Final   Enteropathogenic E coli (EPEC) NOT DETECTED NOT DETECTED Final   Enterotoxigenic E coli (ETEC) NOT DETECTED NOT DETECTED Final   Shiga like toxin producing E coli (STEC) NOT DETECTED NOT DETECTED Final   Shigella/Enteroinvasive E coli (EIEC) NOT DETECTED NOT DETECTED Final   Cryptosporidium NOT DETECTED NOT DETECTED Final   Cyclospora cayetanensis NOT DETECTED NOT DETECTED Final   Entamoeba histolytica NOT DETECTED NOT DETECTED Final   Giardia lamblia NOT DETECTED NOT DETECTED Final   Adenovirus F40/41 NOT DETECTED NOT DETECTED Final   Astrovirus NOT DETECTED NOT DETECTED Final   Norovirus GI/GII NOT DETECTED NOT DETECTED Final   Rotavirus A NOT DETECTED NOT DETECTED Final   Sapovirus (I, II, IV, and V) NOT DETECTED NOT DETECTED Final    Comment: Performed at [facility], [address] [place], [place] [address]  C Difficile Quick Screen w PCR reflex     Status: None   Collection Time: 11/27/20  7:32 PM   Specimen: STOOL  Result Value Ref Range Status   C Diff antigen NEGATIVE NEGATIVE Final   C Diff toxin NEGATIVE NEGATIVE Final   C Diff interpretation No C. difficile  detected.  Final    Comment: Performed at [facility], [place] [address], [place], [place] [address]  MRSA Next Gen by PCR, Nasal     Status: None   Collection Time: 11/29/20  5:25 AM   Specimen: Nasal Mucosa; Nasal Swab  Result Value Ref Range Status   MRSA by PCR Next Gen NOT DETECTED NOT DETECTED Final    Comment: (NOTE) The GeneXpert MRSA Assay (FDA approved for NASAL specimens only), is one component of a comprehensive MRSA colonization surveillance program. It is not intended to diagnose MRSA infection nor to guide or monitor treatment for MRSA infections. Test performance is not FDA approved in patients less than 66 years old. Performed at [facility], [place] [address] [place],  [address]      Serology:    Imaging: If present, new imagings (plain films, ct scans, and mri) have been personally visualized and interpreted; radiology reports have been reviewed. Decision making incorporated into the Impression / Recommendations.  8/21 abd u/s 1. Layering sludge within the gallbladder. 2. Complex small pericholecystic fluid collection noted, of uncertain significance. 3. Limited evaluation of the LEFT hepatic lobe, kidneys, pancreas, spleen, IVC. 4. 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   8/18 renal u/s 1. Bilateral echogenic kidneys likely secondary to medical renal disease. 2. Moderate severity bilateral hydronephrosis. 3. 1.1 cm nonobstructing left renal calculus. 4. Bilateral simple renal cysts.    8/18 cxr clear  Jabier Mutton, [facility] [place] [phone] pager    12/01/2020, 10:02 AM

## 2020-12-01 NOTE — ED Provider Notes (Signed)
**Henry Carter De-Identified via Obfuscation** Henry Henry Carter   CSN: [id number] Arrival date & time: 11/27/20  1203     History Chief Complaint  Patient presents with   Failure To Thrive    Coming from home with wife- House was dirty and in a disarray.  ? bedbugs    Henry Henry Carter is a 80 y.o. male.  According to the wife patient fell out of bed onto the floor and has been very weak  The history is provided by a relative and the EMS personnel. No language interpreter was used.  Weakness Severity:  Severe Onset quality:  Sudden Timing:  Constant Progression:  Worsening Chronicity:  Recurrent Context: not alcohol use   Relieved by:  Nothing Worsened by:  Nothing Associated symptoms: no abdominal pain       Past Medical History:  Diagnosis Date    iron deficiency 01/18/2013   Angioedema    BPH (benign prostatic hyperplasia)    COPD (chronic obstructive pulmonary disease) ([place])    STates he has copd   Essential hypertension    GERD (gastroesophageal reflux disease)    Helicobacter pylori gastritis 10/10/2012   Hyperlipidemia    IBS (irritable bowel syndrome)    Impotence    Peripheral vascular disease, unspecified ([place])    Personal history of colonic adenoma 09/22/2012   Personal history of tobacco use, presenting hazards to health     Patient Active Problem List   Diagnosis Date Noted   Bacteremia 12/01/2020   Palliative care by specialist    Goals of care, counseling/discussion    General weakness    Right leg DVT ([place]) 11/29/2020   Pancytopenia ([place]) 11/29/2020   Bilateral hydronephrosis 11/28/2020   Severe sepsis with acute organ dysfunction ([place]) [id number]   Complicated UTI (urinary tract infection) 11/27/2020   Transaminitis 11/27/2020   Severe protein-calorie malnutrition ([place]) 11/27/2020   Bilateral edema of lower extremity 11/27/2020   Hyperglycemia 11/27/2020   Dehydration 11/27/2020   Hypothermia 11/27/2020   Bladder outlet obstruction  08/17/2019   Abdominal pain 08/17/2019   Chronic indwelling Foley catheter 06/29/2019   History of DVT (deep vein thrombosis) 06/29/2019   Obstruction of Foley catheter ([place]) 06/29/2019   Acute bilateral obstructive uropathy    Hypokalemia    Acute metabolic encephalopathy    Hyponatremia    Hematuria 06/09/2019   ARF (acute renal failure) ([place]) 06/09/2019   Acute blood loss anemia 06/09/2019   Hyperkalemia 99991111   Metabolic acidosis 99991111   Left leg DVT ([place]) 05/18/2019   Left leg swelling 05/11/2019   Pre-operative cardiovascular examination 03/22/2019   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[place]) 07/11/2018   Right arm weakness 07/21/2017   Non compliance w medication regimen 07/30/2016   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   Mild cognitive impairment 12/11/2015   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   Healthcare maintenance 02/07/2013   Iron deficiency anemia due to chronic blood loss 01/18/2013   Diarrhea 09/22/2012   C O P D 04/08/2010   Atherosclerosis of native artery of extremity with intermittent claudication ([place]) 11/26/2008   Benign prostatic hyperplasia 10/20/2007   PERIPHERAL VASCULAR DISEASE 05/19/2007   Essential hypertension 02/01/2007   Hyperlipidemia 01/03/2007    Past Surgical History:  Procedure Laterality Date   abdominal surg for ulcers'  1980   COLONOSCOPY N/A 09/22/2012   Procedure: COLONOSCOPY;  Surgeon: Gatha Mayer, MD;  Location: [facility];  Service: Endoscopy;  Laterality: N/A;   distal aortogram     IVC  FILTER INSERTION N/A 06/11/2019   Procedure: IVC FILTER INSERTION;  Surgeon: Waynetta Sandy, MD;  Location: [facility];  Service: Cardiovascular;  Laterality: N/A;   IVC VENOGRAPHY N/A 06/11/2019   Procedure: IVC Venography;  Surgeon: Waynetta Sandy, MD;  Location: [facility];  Service: Cardiovascular;  Laterality: N/A;   percutaneous transluminal angionplasty with placement of 2 self  expanding stent in the distal left superfical femeral artery         Family History  Problem Relation Age of Onset   Alcohol abuse Father    Kidney disease Brother    Drug abuse Brother    Diabetes Brother    Diabetes Mother    Diabetes Brother    Colon cancer Neg Hx     Social History   Tobacco Use   Smoking status: Former    Packs/day: 0.25    Years: 50.00    Pack years: 12.50    Types: Cigarettes    Start date: 04/12/1957    Quit date: 04/12/2014    Years since quitting: 6.6   Smokeless tobacco: Never   Tobacco comments:    using chatix; stopped now; took x 1 week / had stopped 3 years prior to starting back in 2005  Vaping Use   Vaping Use: Never used  Substance Use Topics   Alcohol use: No    Alcohol/week: 0.0 standard drinks    Comment: OCC   Drug use: Yes    Comment: States marijuanna relaxes him;     Home Medications Prior to Admission medications   Medication Sig Start Date End Date Taking? Authorizing Provider  ciprofloxacin (CIPRO) 250 MG tablet Take 1 tablet (250 mg total) by mouth every 12 (twelve) hours. Patient not taking: Reported on 11/27/2020 08/17/19   Virgel Manifold, MD  ferrous sulfate 325 (65 FE) MG tablet Take 325 mg by mouth daily with breakfast.    [provider]  finasteride (PROSCAR) 5 MG tablet Take 5 mg by mouth daily.    [provider]  simvastatin (ZOCOR) 20 MG tablet Take 20 mg by mouth daily.    [provider]  traMADol (ULTRAM) 50 MG tablet TAKE 1 TABLET (50 MG TOTAL) BY MOUTH EVERY 6 (SIX) HOURS AS NEEDED. Patient taking differently: Take 50 mg by mouth every 6 (six) hours as needed for moderate pain. 11/08/19   Biagio Borg, MD    Allergies    Ace inhibitors and Penicillins  Review of Systems   Review of Systems  Unable to perform ROS: Mental status change  Gastrointestinal:  Negative for abdominal pain.  Neurological:  Positive for weakness.   Physical Exam Updated Vital Signs BP 114/64 (BP  Location: Right Arm)   Pulse 91   Temp 98.4 F (36.9 C) (Bladder)   Resp 11   Ht '5\' 11"'$  (1.803 m)   Wt 66 kg   SpO2 100%   BMI 20.29 kg/m   Physical Exam Vitals and nursing Henry Carter reviewed.  Constitutional:      General: He is in acute distress.     Appearance: He is well-developed.     Comments: Lethargic  HENT:     Head: Normocephalic.     Nose: Nose normal.     Mouth/Throat:     Comments: Dry mucous membrane Eyes:     General: No scleral icterus.    Conjunctiva/sclera: Conjunctivae normal.  Neck:     Thyroid: No thyromegaly.     Trachea: No  tracheal deviation.  Cardiovascular:     Rate and Rhythm: Normal rate and regular rhythm.     Heart sounds: No murmur heard.   No friction rub. No gallop.     Comments: Tachycardia Pulmonary:     Effort: Pulmonary effort is normal.     Breath sounds: No stridor. No wheezing or rales.  Chest:     Chest wall: No tenderness.  Abdominal:     General: There is no distension.     Tenderness: There is no abdominal tenderness. There is no rebound.  Musculoskeletal:        General: Normal range of motion.     Cervical back: Neck supple.  Lymphadenopathy:     Cervical: No cervical adenopathy.  Skin:    General: Skin is warm.     Findings: No erythema or rash.  Neurological:     Motor: No abnormal muscle tone.     Coordination: Coordination normal.  Psychiatric:        Behavior: Behavior normal.    ED Results / Procedures / Treatments   Labs (all labs ordered are listed, but only abnormal results are displayed) Labs Reviewed  CULTURE, BLOOD (ROUTINE X 2) - Abnormal; Notable for the following components:      Result Value   Culture  Setup Time   (*)    Value: GRAM VARIABLE ROD ANAEROBIC BOTTLE ONLY CRITICAL RESULT CALLED TO, READ BACK BY AND VERIFIED WITHGuadlupe Spanish PHARMD [id number] 11/28/20 A BROWNING Performed at [facility], [address] [place], [place] [address]    Culture   (*)    Value:  DIPHTHEROIDS(CORYNEBACTERIUM SPECIES) Standardized susceptibility testing for this organism is not available. CLOSTRIDIUM PERFRINGENS    All other components within normal limits  URINE CULTURE - Abnormal; Notable for the following components:   Culture   (*)    Value: >=100,000 COLONIES/mL AEROCOCCUS SPECIES Standardized susceptibility testing for this organism is not available. Performed at [facility], [place] [address] [place], [place] [address]    All other components within normal limits  LACTIC ACID, PLASMA - Abnormal; Notable for the following components:   Lactic Acid, Venous 3.9 (*)    All other components within normal limits  LACTIC ACID, PLASMA - Abnormal; Notable for the following components:   Lactic Acid, Venous >11.0 (*)    All other components within normal limits  COMPREHENSIVE METABOLIC PANEL - Abnormal; Notable for the following components:   Sodium 132 (*)    Potassium 5.2 (*)    CO2 14 (*)    Glucose, Bld 256 (*)    BUN 86 (*)    Creatinine, Ser 2.66 (*)    Calcium 8.2 (*)    Total Protein 5.4 (*)    Albumin 2.6 (*)    AST 64 (*)    GFR, Estimated 24 (*)    All other components within normal limits  CBC WITH DIFFERENTIAL/PLATELET - Abnormal; Notable for the following components:   RBC 3.51 (*)    Hemoglobin 8.6 (*)    HCT 28.2 (*)    MCH 24.5 (*)    RDW 23.9 (*)    nRBC 14.9 (*)    Lymphs Abs 0.0 (*)    All other components within normal limits  URINALYSIS, ROUTINE W REFLEX MICROSCOPIC - Abnormal; Notable for the following components:   APPearance TURBID (*)    Hgb urine dipstick MODERATE (*)    Ketones, ur 5 (*)    Protein,  ur 100 (*)    Leukocytes,Ua MODERATE (*)    All other components within normal limits  URINALYSIS, MICROSCOPIC (REFLEX) - Abnormal; Notable for the following components:   Bacteria, UA MANY (*)    Non Squamous Epithelial PRESENT (*)    All other components within normal limits  BASIC METABOLIC PANEL - Abnormal;  Notable for the following components:   Sodium 133 (*)    CO2 15 (*)    Glucose, Bld 130 (*)    BUN 75 (*)    Creatinine, Ser 2.51 (*)    Calcium 8.0 (*)    GFR, Estimated 25 (*)    All other components within normal limits  COMPREHENSIVE METABOLIC PANEL - Abnormal; Notable for the following components:   CO2 16 (*)    BUN 80 (*)    Creatinine, Ser 2.55 (*)    Calcium 8.0 (*)    Total Protein 5.4 (*)    Albumin 3.0 (*)    AST 99 (*)    GFR, Estimated 25 (*)    All other components within normal limits  PHOSPHORUS - Abnormal; Notable for the following components:   Phosphorus 5.7 (*)    All other components within normal limits  PHOSPHORUS - Abnormal; Notable for the following components:   Phosphorus 5.7 (*)    All other components within normal limits  CBC WITH DIFFERENTIAL/PLATELET - Abnormal; Notable for the following components:   WBC 2.7 (*)    RBC 2.69 (*)    Hemoglobin 6.6 (*)    HCT 20.5 (*)    MCV 76.2 (*)    MCH 24.5 (*)    RDW 23.0 (*)    Platelets 124 (*)    nRBC 6.4 (*)    Lymphs Abs 0.1 (*)    All other components within normal limits  TSH - Abnormal; Notable for the following components:   TSH 4.521 (*)    All other components within normal limits  HEMOGLOBIN A1C - Abnormal; Notable for the following components:   Hgb A1c MFr Bld 6.0 (*)    All other components within normal limits  VITAMIN B12 - Abnormal; Notable for the following components:   Vitamin B-12 976 (*)    All other components within normal limits  IRON AND TIBC - Abnormal; Notable for the following components:   Iron 43 (*)    Saturation Ratios 15 (*)    All other components within normal limits  RETICULOCYTES - Abnormal; Notable for the following components:   RBC. 2.97 (*)    Immature Retic Fract 25.2 (*)    All other components within normal limits  LACTIC ACID, PLASMA - Abnormal; Notable for the following components:   Lactic Acid, Venous 2.1 (*)    All other components within  normal limits  HEMOGLOBIN AND HEMATOCRIT, BLOOD - Abnormal; Notable for the following components:   Hemoglobin 9.6 (*)    HCT 28.7 (*)    All other components within normal limits  CBC WITH DIFFERENTIAL/PLATELET - Abnormal; Notable for the following components:   RBC 4.02 (*)    Hemoglobin 10.3 (*)    HCT 30.2 (*)    MCV 75.1 (*)    MCH 25.6 (*)    RDW 21.2 (*)    Platelets 114 (*)    nRBC 2.7 (*)    Lymphs Abs 0.3 (*)    All other components within normal limits  COMPREHENSIVE METABOLIC PANEL - Abnormal; Notable for the following components:   Potassium 3.3 (*)  CO2 21 (*)    Glucose, Bld 61 (*)    BUN 77 (*)    Creatinine, Ser 2.26 (*)    Calcium 7.6 (*)    Total Protein 5.2 (*)    Albumin 2.7 (*)    AST 150 (*)    ALT 45 (*)    GFR, Estimated 29 (*)    All other components within normal limits  PSA - Abnormal; Notable for the following components:   Prostatic Specific Antigen 11.00 (*)    All other components within normal limits  GLUCOSE, CAPILLARY - Abnormal; Notable for the following components:   Glucose-Capillary 139 (*)    All other components within normal limits  COMPREHENSIVE METABOLIC PANEL - Abnormal; Notable for the following components:   Potassium 2.8 (*)    BUN 72 (*)    Creatinine, Ser 2.20 (*)    Calcium 7.7 (*)    Total Protein 5.0 (*)    Albumin 2.6 (*)    AST 140 (*)    ALT 45 (*)    GFR, Estimated 30 (*)    All other components within normal limits  CBC WITH DIFFERENTIAL/PLATELET - Abnormal; Notable for the following components:   RBC 3.88 (*)    Hemoglobin 9.9 (*)    HCT 29.7 (*)    MCV 76.5 (*)    MCH 25.5 (*)    RDW 21.7 (*)    Platelets 101 (*)    nRBC 1.2 (*)    nRBC 2 (*)    All other components within normal limits  MAGNESIUM - Abnormal; Notable for the following components:   Magnesium 2.5 (*)    All other components within normal limits  COMPREHENSIVE METABOLIC PANEL - Abnormal; Notable for the following components:    Potassium 3.2 (*)    Glucose, Bld 146 (*)    BUN 59 (*)    Creatinine, Ser 1.91 (*)    Calcium 7.6 (*)    Total Protein 4.8 (*)    Albumin 2.3 (*)    AST 103 (*)    GFR, Estimated 35 (*)    All other components within normal limits  CBC WITH DIFFERENTIAL/PLATELET - Abnormal; Notable for the following components:   RBC 3.86 (*)    Hemoglobin 9.8 (*)    HCT 30.6 (*)    MCV 79.3 (*)    MCH 25.4 (*)    RDW 22.2 (*)    Platelets 107 (*)    nRBC 0.9 (*)    nRBC 3 (*)    All other components within normal limits  GLUCOSE, CAPILLARY - Abnormal; Notable for the following components:   Glucose-Capillary 128 (*)    All other components within normal limits  GLUCOSE, CAPILLARY - Abnormal; Notable for the following components:   Glucose-Capillary 121 (*)    All other components within normal limits  GLUCOSE, CAPILLARY - Abnormal; Notable for the following components:   Glucose-Capillary 134 (*)    All other components within normal limits  CBG MONITORING, ED - Abnormal; Notable for the following components:   Glucose-Capillary 54 (*)    All other components within normal limits  CBG MONITORING, ED - Abnormal; Notable for the following components:   Glucose-Capillary 139 (*)    All other components within normal limits  RESP PANEL BY RT-PCR (FLU A&B, COVID) [id number]  CULTURE, BLOOD (ROUTINE X 2)  C DIFFICILE QUICK SCREEN W PCR REFLEX    GASTROINTESTINAL PANEL BY PCR, STOOL (REPLACES STOOL CULTURE)  BLOOD  CULTURE ID PANEL (REFLEXED) - BCID2  MRSA NEXT GEN BY PCR, NASAL  PROTIME-INR  APTT  MAGNESIUM  MAGNESIUM  PROCALCITONIN  HEPATITIS PANEL, ACUTE  FOLATE  FERRITIN  SODIUM, URINE, RANDOM  CREATININE, URINE, RANDOM  LACTIC ACID, PLASMA  LACTIC ACID, PLASMA  MAGNESIUM  PHOSPHORUS  MAGNESIUM  PHOSPHORUS  GLUCOSE, CAPILLARY  GLUCOSE, CAPILLARY  GLUCOSE, CAPILLARY  OCCULT BLOOD X 1 CARD TO LAB, STOOL  ANCA TITERS  HIV ANTIBODY (ROUTINE TESTING W REFLEX)  I-STAT CHEM 8, ED   TYPE AND SCREEN  PREPARE RBC (CROSSMATCH)  PREPARE RBC (CROSSMATCH)    EKG None  Radiology US Abdomen Complete  Result Date: 11/30/2020 CLINICAL DATA:  Transaminitis. EXAM: ABDOMEN ULTRASOUND COMPLETE COMPARISON:  CT of the abdomen and pelvis on 08/17/2019 FINDINGS: Gallbladder: Gallbladder wall is UPPER normal, 3 millimeters. There is no sonographic Murphy's sign. In the gallbladder lumen, there is layering sludge without stones. Complex pericholecystic fluid collection noted. Common bile duct: Diameter: 5.0 millimeters Liver: Visualized portions of the liver are unremarkable. LEFT hepatic lobe is difficult to visualized secondary to overlying bowel gas. Portal vein is patent on color Doppler imaging with normal direction of blood flow towards the liver. IVC: Not well seen Pancreas: Partially imaged.  No abnormality identified. Spleen: Not well seen. Right Kidney: Length: Not well seen. Left Kidney: Length: Not well seen. Abdominal aorta: Abdominal aortic aneurysm measures at least 3.2 centimeters. Mural thrombus again noted. Other findings: Study quality is degraded by immobility, difficulty holding breath, and overlying bowel gas. IMPRESSION: 1. Layering sludge within the gallbladder. 2. Complex small pericholecystic fluid collection noted, of uncertain significance. 3. Limited evaluation of the LEFT hepatic lobe, kidneys, pancreas, spleen, IVC. 4. 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 Electronically Signed   By: Nolon Nations M.D.   On: 11/30/2020 17:53    Procedures Procedures   Medications Ordered in ED Medications  finasteride (PROSCAR) tablet 5 mg (5 mg Oral Given 12/01/20 0913)  ferrous sulfate tablet 325 mg (325 mg Oral Given 12/01/20 0913)  sodium chloride flush (NS) 0.9 % injection 3 mL (0 mLs Intravenous Duplicate [id number])  acetaminophen (TYLENOL) tablet 650 mg (650 mg Oral Given 11/28/20 0829)    Or   acetaminophen (TYLENOL) suppository 650 mg ( Rectal See Alternative 11/28/20 0829)  senna-docusate (Senokot-S) tablet 1 tablet (has no administration in time range)  sorbitol 70 % solution 30 mL (has no administration in time range)  ondansetron (ZOFRAN) tablet 4 mg (has no administration in time range)    Or  ondansetron (ZOFRAN) injection 4 mg (has no administration in time range)  folic acid (FOLVITE) tablet 1 mg (1 mg Oral Given 12/01/20 0915)  multivitamin with minerals tablet 1 tablet (1 tablet Oral Given 12/01/20 0912)  albuterol (PROVENTIL) (2.5 MG/3ML) 0.083% nebulizer solution 2.5 mg (has no administration in time range)  ipratropium (ATROVENT) nebulizer solution 0.5 mg (has no administration in time range)  metoprolol tartrate (LOPRESSOR) injection 5 mg (has no administration in time range)  umeclidinium bromide (INCRUSE ELLIPTA) 62.5 MCG/INH 1 puff (1 puff Inhalation Given 12/01/20 0831)  Chlorhexidine Gluconate Cloth 2 % PADS 6 each (6 each Topical Given 12/01/20 0915)  pantoprazole (PROTONIX) injection 40 mg (40 mg Intravenous Given 12/01/20 0913)  collagenase (SANTYL) ointment ( Topical Given 12/01/20 0914)  0.9 %  sodium chloride infusion ( Intravenous Infusion Verify 12/01/20 1612)  feeding supplement (BOOST / RESOURCE BREEZE) liquid 1 Container (1 Container Oral Given  12/01/20 1301)  feeding supplement (ENSURE SURGERY) liquid 237 mL (237 mLs Oral Given 12/01/20 1300)  nutrition supplement (JUVEN) (JUVEN) powder packet 1 packet (1 packet Oral Patient Refused/Not Given 12/01/20 1459)  chlorhexidine (PERIDEX) 0.12 % solution 15 mL (15 mLs Mouth Rinse Given 12/01/20 0913)  MEDLINE mouth rinse (15 mLs Mouth Rinse Patient Refused/Not Given 12/01/20 1546)  loperamide (IMODIUM) capsule 2 mg (2 mg Oral Patient Refused/Not Given 11/30/20 1418)  loperamide (IMODIUM) capsule 2 mg (has no administration in time range)  heparin injection 5,000 Units (5,000 Units Subcutaneous Given 12/01/20 1301)   dextrose 5 %-0.9 % sodium chloride infusion ( Intravenous Infusion Verify 12/01/20 1612)  amoxicillin (AMOXIL) capsule 500 mg (500 mg Oral Given 12/01/20 1208)  lactated ringers bolus 1,000 mL (0 mLs Intravenous Stopped 11/27/20 1705)    And  lactated ringers bolus 1,000 mL (0 mLs Intravenous Stopped 11/27/20 1705)  aztreonam (AZACTAM) 2 g in sodium chloride 0.9 % 100 mL IVPB (2 g Intravenous New Bag/Given 11/27/20 1341)  metroNIDAZOLE (FLAGYL) IVPB 500 mg (0 mg Intravenous Stopped 11/27/20 1821)  dextrose 50 % solution 50 mL (50 mLs Intravenous Given 11/27/20 1315)  dextrose 50 % solution (  Given by Other 11/27/20 1405)  vancomycin (VANCOREADY) IVPB 1250 mg/250 mL (0 mg Intravenous Stopped 11/27/20 1635)  sodium chloride 0.9 % bolus 1,000 mL ( Intravenous Stopped 11/27/20 1837)  albumin human 25 % solution 25 g (0 g Intravenous Stopped 11/27/20 2230)  sodium chloride 0.9 % bolus 500 mL (0 mLs Intravenous Stopped 11/27/20 2100)  albumin human 25 % solution 25 g (0 g Intravenous Duplicate [id number])  0.9 %  sodium chloride infusion (Manually program via Guardrails IV Fluids) (0 mLs Intravenous Stopped 11/28/20 0800)  0.9 %  sodium chloride infusion (Manually program via Guardrails IV Fluids) (0 mLs Intravenous Stopped 11/28/20 1727)  acetaminophen (TYLENOL) tablet 650 mg (0 mg Oral Duplicate [id number])  diphenhydrAMINE (BENADRYL) capsule 25 mg (25 mg Oral Given 11/28/20 0829)  furosemide (LASIX) injection 20 mg (20 mg Intravenous Given 11/28/20 0829)  loperamide (IMODIUM) capsule 4 mg (4 mg Oral Given 11/28/20 1143)  oxyCODONE (Oxy IR/ROXICODONE) immediate release tablet 5 mg (5 mg Oral Given 11/28/20 1142)  potassium chloride SA (KLOR-CON) CR tablet 40 mEq (40 mEq Oral Given 11/29/20 1351)  magnesium sulfate IVPB 4 g 100 mL (0 g Intravenous Stopped 11/30/20 1152)  potassium chloride SA (KLOR-CON) CR tablet 40 mEq (40 mEq Oral Given 11/30/20 1200)  potassium chloride SA (KLOR-CON) CR tablet 40 mEq (40 mEq  Oral Given 12/01/20 1208)    ED Course  I have reviewed the triage vital signs and the nursing notes.  Pertinent labs & imaging results that were available during my care of the patient were reviewed by me and considered in my medical decision making (see chart for details). CRITICAL CARE Performed by: Milton Ferguson Total critical care time: 45 minutes Critical care time was exclusive of separately billable procedures and treating other patients. Critical care was necessary to treat or prevent imminent or life-threatening deterioration. Critical care was time spent personally by me on the following activities: development of treatment plan with patient and/or surrogate as well as nursing, discussions with consultants, evaluation of patient's response to treatment, examination of patient, obtaining history from patient or surrogate, ordering and performing treatments and interventions, ordering and review of laboratory studies, ordering and review of radiographic studies, pulse oximetry and re-evaluation of patient's condition.    MDM Rules/Calculators/A&P  Patient with sepsis.  Most likely urinary tract infection.  He will be admitted to medicine Final Clinical Impression(s) / ED Diagnoses Final diagnoses:  ARF (acute renal failure) ([place])  Transaminitis  Acute sepsis [facility])    Rx / DC Orders ED Discharge Orders     None        Milton Ferguson, MD 12/01/20 [phone]

## 2020-12-02 DIAGNOSIS — A419 Sepsis, unspecified organism: Secondary | ICD-10-CM | POA: Diagnosis not present

## 2020-12-02 DIAGNOSIS — N401 Enlarged prostate with lower urinary tract symptoms: Secondary | ICD-10-CM | POA: Diagnosis not present

## 2020-12-02 DIAGNOSIS — R7881 Bacteremia: Secondary | ICD-10-CM | POA: Diagnosis not present

## 2020-12-02 DIAGNOSIS — N390 Urinary tract infection, site not specified: Secondary | ICD-10-CM | POA: Diagnosis not present

## 2020-12-02 DIAGNOSIS — R6520 Severe sepsis without septic shock: Secondary | ICD-10-CM | POA: Diagnosis not present

## 2020-12-02 DIAGNOSIS — N179 Acute kidney failure, unspecified: Secondary | ICD-10-CM | POA: Diagnosis not present

## 2020-12-02 LAB — CULTURE, BLOOD (ROUTINE X 2): Culture: NO GROWTH

## 2020-12-02 LAB — CBC WITH DIFFERENTIAL/PLATELET
Abs Immature Granulocytes: 0.02 10*3/uL (ref 0.00–0.07)
Basophils Absolute: 0 10*3/uL (ref 0.0–0.1)
Basophils Relative: 0 %
Eosinophils Absolute: 0.1 10*3/uL (ref 0.0–0.5)
Eosinophils Relative: 1 %
HCT: 31.5 % — ABNORMAL LOW (ref 39.0–52.0)
Hemoglobin: 10 g/dL — ABNORMAL LOW (ref 13.0–17.0)
Immature Granulocytes: 0 %
Lymphocytes Relative: 8 %
Lymphs Abs: 0.4 10*3/uL — ABNORMAL LOW (ref 0.7–4.0)
MCH: 25.6 pg — ABNORMAL LOW (ref 26.0–34.0)
MCHC: 31.7 g/dL (ref 30.0–36.0)
MCV: 80.6 fL (ref 80.0–100.0)
Monocytes Absolute: 0.2 10*3/uL (ref 0.1–1.0)
Monocytes Relative: 4 %
Neutro Abs: 4.6 10*3/uL (ref 1.7–7.7)
Neutrophils Relative %: 87 %
Platelets: 118 10*3/uL — ABNORMAL LOW (ref 150–400)
RBC: 3.91 MIL/uL — ABNORMAL LOW (ref 4.22–5.81)
RDW: 22.6 % — ABNORMAL HIGH (ref 11.5–15.5)
WBC: 5.3 10*3/uL (ref 4.0–10.5)
nRBC: 0.6 % — ABNORMAL HIGH (ref 0.0–0.2)

## 2020-12-02 LAB — RENAL FUNCTION PANEL
Albumin: 2.3 g/dL — ABNORMAL LOW (ref 3.5–5.0)
Anion gap: 5 (ref 5–15)
BUN: 49 mg/dL — ABNORMAL HIGH (ref 8–23)
CO2: 23 mmol/L (ref 22–32)
Calcium: 7.9 mg/dL — ABNORMAL LOW (ref 8.9–10.3)
Chloride: 110 mmol/L (ref 98–111)
Creatinine, Ser: 1.57 mg/dL — ABNORMAL HIGH (ref 0.61–1.24)
GFR, Estimated: 45 mL/min — ABNORMAL LOW (ref >60)
Glucose, Bld: 93 mg/dL (ref 70–99)
Phosphorus: 1.9 mg/dL — ABNORMAL LOW (ref 2.5–4.6)
Potassium: 3.8 mmol/L (ref 3.5–5.1)
Sodium: 138 mmol/L (ref 135–145)

## 2020-12-02 LAB — GLUCOSE, CAPILLARY
Glucose-Capillary: 125 mg/dL — ABNORMAL HIGH (ref 70–99)
Glucose-Capillary: 155 mg/dL — ABNORMAL HIGH (ref 70–99)
Glucose-Capillary: 79 mg/dL (ref 70–99)
Glucose-Capillary: 88 mg/dL (ref 70–99)
Glucose-Capillary: 94 mg/dL (ref 70–99)

## 2020-12-02 LAB — MAGNESIUM: Magnesium: 2.2 mg/dL (ref 1.7–2.4)

## 2020-12-02 LAB — ANCA TITERS
Atypical P-ANCA titer: <1:20 {titer}
C-ANCA: <1:20 {titer}
P-ANCA: <1:20 {titer}

## 2020-12-02 LAB — HIV ANTIBODY (ROUTINE TESTING W REFLEX): HIV Screen 4th Generation wRfx: NONREACTIVE

## 2020-12-02 MED ORDER — AMOXICILLIN 500 MG PO CAPS
1000.0000 mg | ORAL_CAPSULE | Freq: Three times a day (TID) | ORAL | Status: DC
  Administered 2020-12-02 – 2020-12-04 (×8): 1000 mg via ORAL
  Filled 2020-12-02 (×9): qty 2

## 2020-12-02 MED ORDER — PANTOPRAZOLE SODIUM 40 MG PO TBEC
40.0000 mg | DELAYED_RELEASE_TABLET | Freq: Two times a day (BID) | ORAL | Status: DC
  Administered 2020-12-02 – 2020-12-04 (×6): 40 mg via ORAL
  Filled 2020-12-02 (×6): qty 1

## 2020-12-02 MED ORDER — POTASSIUM PHOSPHATES 15 MMOLE/5ML IV SOLN
30.0000 mmol | Freq: Once | INTRAVENOUS | Status: AC
  Administered 2020-12-02: 30 mmol via INTRAVENOUS
  Filled 2020-12-02: qty 10

## 2020-12-02 NOTE — Progress Notes (Signed)
OT Cancellation Note  Patient Details Name: Henry Carter MRN: [id number] DOB: 05-28-1940   OT note: Please refer to OT evaluation completed on 8/19. Patient is not a candidate for skilled OT intervention. Discussion with nurse confirmed that patient continues to have no initiation to assist with any ADL tasks. Patients wife indicated at time of evaluation being total assist for ADLs and feeding for over 4 months at this time. OT signing off at this time. Jackelyn Poling OTR/L, [place] Acute Rehabilitation Department Office# [phone] Pager# [phone]   [place] 12/02/2020, 11:57 AM

## 2020-12-02 NOTE — Progress Notes (Signed)
Physical therapy Note- Please refer to PT evaluation on 11/28/20. Patient is not a candidate for skilled PT intervention. Patient was limited participation in Evaluation. Wife at bedside reported patient has been bed bound several months . PT signing off. Tresa Endo PT Acute Rehabilitation Services Pager [phone] Office [phone]

## 2020-12-02 NOTE — Progress Notes (Addendum)
PROGRESS NOTE    Raye Bickhart  [id number] DOB: 1940-12-25 DOA: 11/27/2020 PCP: [name], MD   Chief Complaint  Patient presents with   Failure To Thrive    Coming from home with wife- House was dirty and in a disarray.  ? bedbugs    Brief Narrative:  Patient 80 year old gentleman history of iron deficiency anemia, BPH, chronic bladder outlet obstruction, COPD, hypertension, GERD, PVD presented to the ED via EMS after he was noted to rolled off the bed onto the floor.  Per epic review it is noted that patient's wife presented to PCPs office stating she needed help and could not take care of patient any longer and needed a letter to get patient placed in that patient had rolled off the bed onto the floor.  PCPs office subsequently called EMS patient brought to the ED.  Patient on presentation noted to have a profound hypothermia with a temperature of 85, generalized weakness, mottled lower extremities and noted to be covered in stool and bedbugs with some bilateral hip wounds.  Patient admitted with severe sepsis and placed empirically on IV antibiotics.  Patient placed on Bair hugger and admitted to the stepdown unit.   Assessment & Plan:   Principal Problem:   Severe sepsis with acute organ dysfunction (HCC) Active Problems:   Hypothermia   Hyperlipidemia   Essential hypertension   PERIPHERAL VASCULAR DISEASE   Benign prostatic hyperplasia   Diarrhea   Iron deficiency anemia due to chronic blood loss   ARF (acute renal failure) (HCC)   Hyperkalemia   Metabolic acidosis   Chronic indwelling Foley catheter   Complicated UTI (urinary tract infection)   Transaminitis   Severe protein-calorie malnutrition (HCC)   Bilateral edema of lower extremity   Hyperglycemia   Dehydration   Bilateral hydronephrosis   Right leg DVT (HCC)   Pancytopenia (HCC)   Palliative care by specialist   Goals of care, counseling/discussion   General weakness   Bacteremia  1  severe sepsis with organ dysfunction, POA secondary to Aerococcus UTI and Clostridium perfringens bacteremia -Patient presented with criteria/signs of severe sepsis with organ dysfunction with severe hypothermia with a temperature of 85, noted to be hypotensive on admission with blood pressure of 84/51, lactic acid level elevated at 3.9, and acute renal failure with a creatinine of 2.66, urinalysis was cloudy and milky, with moderate leukocytes, nitrite negative, many bacteria, > 50 WBCs with concerns for UTI.  Patient also noted with profuse diarrhea. -Patient pancultured with blood cultures with Clostridium perfringens.  -Urine cultures with > 100,000 aerococcus species. -Chest x-ray with no acute infiltrate. -Repeat lactic acid level trending down.   -C. difficile PCR negative.   -GI pathogen panel negative -.  Status post IV albumin x1.  -Discontinued bicarb drip and placed on D5 normal saline. -IV vancomycin, IV aztreonam initially narrowed to IV Rocephin and IV Flagyl.   -Urine cultures with Aerococcus species.  Patient with penicillin allergy.   -Discontinued IV Rocephin, IV Flagyl and placed empirically on IV vancomycin to complete course of antibiotic treatment.   -Bair hugger. -ID consulted and IV vancomycin transition to oral amoxicillin/penicillin challenge which patient seems to be tolerating.  ID recommending a 10-day course from 12/01/2020-12/11/2020 -ID following and appreciate input and recommendations. -PCCM was consulted and assessed the patient and have signed off.   -Supportive care.    2.  Hypothermia -Likely secondary to problem #1. -Patient pancultured with blood cultures positive for Clostridium perfringens.  -Urine cultures  with Aerococcus species. -TSH of 4.521.  -Hypothermia slowly improving.  -Taken off Bair hugger since early this morning. -IV antibiotics were narrowed down to IV Flagyl and IV Rocephin.  -Patient noted to have a penicillin allergy and as such IV  antibiotics changed to IV vancomycin due to urine culture results of Aerococcus species.  -ID consulted and IV vancomycin has been transitioned to oral amoxicillin/penicillin challenge which patient seems to be tolerating. -Bair hugger as needed.   -If remains off the Bair hugger for 24 hours could likely transfer to the floor.  3.  Hypotension Likely multifactorial secondary to hypovolemia from GI losses from profuse diarrhea, infectious etiology from probable UTI.  -Patient has been pancultured with cultures positive for Aerococcus UTI, Clostridium perfringens bacteremia.  -Blood pressure improved with IV fluids.   -Decrease IV fluids to 50 cc an hour. -IV antibiotics have been transitioned to oral antibiotics with a penicillin challenge per ID. -Follow.  4.  Acute renal failure -Likely secondary to prerenal azotemia secondary to profuse ongoing diarrhea from GI losses and post renal azotemia with bilateral hydronephrosis noted on renal ultrasound. -Renal ultrasound also consistent with medical renal disease. -Renal function improving with creatinine currently at 1.57 from 1.91 from 2.20 from 2.26 from 2.55 from 2.66 on admission. -Patient noted to have a creatinine of 1.29 on 07/19/2019. -.  Urinalysis concerning for UTI with 100 protein.  -Urine sodium 48, urine creatinine 22.66.  -FeNa 4.  -Urine output picking up and patient with a urine output of 1.250 L over the past 24 hours.  -Continue Foley catheter which patient will be discharged on due to bilateral hydronephrosis. -ANCA panel pending.   -Decrease IV fluids to 50 cc an hour.  -Strict I's and O's.   -Daily weights.   5.  Diarrhea -Patient with profuse ongoing diarrhea found in his feces on presentation. -Patient noted to be hypothermic, concern for severe sepsis. -C. difficile PCR negative.   -GI pathogen panel negative. -Patient noted to have had a colonoscopy in 2014 which showed a sessile polyp. -Diarrhea improved and  seems to have resolved on scheduled Imodium.   -Rectal pouch removed.   -Imodium as needed.    6.  Dehydration -Likely secondary to poor oral intake, GI losses. -Gentle hydration for another 24 hours.  7.  GERD -Continue PPI twice daily.  8.  Hyperkalemia -Likely secondary to acute renal failure. -Now with hypokalemia.   -Potassium at 3.8.  9.  Metabolic acidosis -Patient with elevated lactic acid level, presenting with profuse watery diarrhea with GI losses, noted to be hypotensive, concern for severe sepsis with urinalysis concerning for UTI. -Patient pancultured. -C. difficile PCR negative.  GI pathogen panel negative.   -Diarrhea improved on scheduled Imodium.   -Acidosis resolved.   -Bicarb discontinued.   -Continue gentle hydration with D5 normal saline at 50 cc an hour for another 24 hours.   -Continue antibiotics.   10.  Lower extremity edema R > L/acute right lower extremity DVT -Patient with lower extremity edema noted on examination with bilateral lower extremity petechiae noted. -Lower extremity Dopplers with acute right lower extremity DVT.  -Patient with history of IVC filter insertion 06/11/2019 secondary to history of DVT with GI bleed.  -Patient noted to have a prior history of lower extremity DVT. -Status post IV albumin x1. -Patient with multiple medical problems, cachectic, poor prognosis. -With a history of prior IVC filter and history of prior GI bleed we will hold off on anticoagulation at this  time. -Discussed with vascular surgery. -Follow.  26.  Transaminitis -Felt likely secondary to shock liver.   -Acute hepatitis panel negative.  -Slowly trending down.   -Abdominal ultrasound with layering sludge within the gallbladder, complex small pericholecystic fluid collection noted of uncertain significance, limited evaluation of left hepatic lobe, kidneys, pancreas, spleen, IVC.  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    -Palliative care consulted and following.   -Repeat labs in the a.m.   12.  Anemia/pancytopenia -Pancytopenia improving.   -Likely secondary to sepsis versus dilutional effect.   -Patient status post transfusion 2 units packed red blood cells.  -Hemoglobin currently at 10.0 from 9.8 from 9.9 from 10.3 from 6.6 (11/28/2020 ) from 8.6 on admission hemoglobin at 6.6 on 11/28/2020 from 8.6 on admission.  -Patient with no overt bleeding.   -No overt bleeding noted. -Patient with prior history of gastritis, AVM per EGD of 10/10/2012, sessile polyps noted on colonoscopy of 10/10/2012.  -Anemia panel with iron of 43, TIBC of 289, ferritin of 192, folate of 7.3, B12 of 976.  -Prophylactic dose heparin discontinued initially and has subsequently been resumed.   -Continue PPI twice daily.   -Patient seen in consultation by GI who feel no endoscopic procedures are warranted at this time, recommended supportive treatment, palliative care evaluation. -Follow H&H. -Transfusion threshold hemoglobin < 7.   13.  Bilateral hydronephrosis/BPH/urinary retention -Renal ultrasound with bilateral hydronephrosis.  -Patient noted to have chronic bladder outlet obstruction and noted to have bilateral hydronephrosis and hydroureter per CT abdomen and pelvis (08/17/2019). -Foley catheter in place with a urine output of 1.250 L over the past 24 hours.   -Proscar -Urology consulted and recommended continuation of Foley catheter, empiric antibiotics.  -Patient will need to be discharged with Foley catheter in place, with outpatient follow-up with urology.  14.  Hyperglycemia -Hemoglobin A1c 6.0.   -CBG 88 this morning.   -Gentle hydration. -Follow.    15.  Aerococcus UTI/Clostridium perfringens bacteremia -Urine cultures with > 100,000 colonies of Aerococcus species.  -1/4 blood cultures with Clostridium perfringens. --Patient with a penicillin allergy.   -IV antibiotics were narrowed to IV Rocephin which would not  cover Aerococcus species (per pharmacy ) and as such patient placed back on IV vancomycin.  -Patient seen in consultation by ID who feel potential sources of sepsis include GU and GI.  -ID place patient on a penicillin/amoxicillin graded challenge which patient seems to be tolerating and recommending 10-day duration of antibiotics from 8/22-12/11/2020. -Appreciate ID input and recommendations.  16.  Severe protein calorie malnutrition/failure to thrive -Patient noted to be found in bed bugs and in feces. -Dietitian consulted.   -Tolerating current dysphagia 3 diet and Ensure supplementation.   -TOC consulted due to concern that APS may need to be involved. -It is noted per PCCM note that patient was previously a patient of [facility] from April to September 2021 and is noted that staff reported that notes reflect that daughter did not want him to return home at that time, family refused assist from staff for bath, nursing visits. -Patient with some improvement with appetite as patient noted to have eating about 90% of his breakfast the morning of 11/29/2020.  -Breakfast tray not at bedside yet. -Palliative care consulted for goals of care as patient noted to be nonambulatory, with progressive failure to thrive per family for several months who are no longer able to care for patient.. -Patient now a DNR and per  palliative care family wanted to continue current mode of care and hopeful for some degree of stabilization with disposition likely home with hospice versus SNF rehab with palliative care  17.  Mechanical fall -Plain films of the pelvis unremarkable. -PT/OT assessed patient and per PT note wife had stated that patient has been bedbound for 7 months with minimal oral intake. -Will likely need long-term placement as per epic notes wife had gone to PCPs office on day of admission stating unable to take care of patient anymore and requesting placement.. -TOC consulted.  18.  Lower extremity  petechiae -?  Etiology. -Patient noted to have bedbugs on admission. -ANCA panel pending.  GI pathogen panel negative.   -Follow.  19.  Elevated PSA -Per urology patient history of elevated PSA and has refused biopsy in the past.  CT done 2021 without any overt metastatic disease. -Per urology.  20.  Hypokalemia/hypophosphatemia -Likely secondary to GI losses. -Potassium at 3.8.  Phosphorus at 1.9.   -K-Phos 30 mmol IV x1.   -Repeat labs in the morning.   20.  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 -Noted to have been there at least 15 months ago and chronic and stable per vascular surgery. -Discussed with vascular surgery, Dr. Donzetta Matters who reviewed films and felt no further intervention or recommendations needed at this time.   DVT prophylaxis: Heparin Code Status: DNR Family Communication: Updated patient.  Wife updated at bedside.  Disposition:   Status is: Inpatient  Remains inpatient appropriate because:Inpatient level of care appropriate due to severity of illness  Dispo: The patient is from: Home              Anticipated d/c is to: Remain in stepdown unit until off Bair hugger x24 hours and then may subsequently transfer to [place].  TBD likely needs long-term placement versus SNF with palliative care following.               Patient currently is not medically stable to d/c.   Difficult to place patient No       Consultants:  PCCM: Dr. Silas Flood 11/27/2020 Urology: Dr. Tresa Moore 11/28/2020 Gastroenterology: Dr. Lyndel Safe 11/28/2020 Palliative care: Dr. Rowe Pavy 11/29/2020 ID: Dr. Gale Journey 12/01/2020 Curb sided: Vascular surgery: Dr. Donzetta Matters 12/01/2020  Procedures: Transfused 2 units packed red blood cells 11/28/2020 Renal ultrasound 11/27/2020 Chest x-ray 11/27/2020 Plain films of the pelvis 11/27/2020 Lower extremity Dopplers 11/28/2020   Antimicrobials:  IV vancomycin 11/27/2020>>>> 11/28/2020 IV aztreonam 11/27/2020>>>>>  11/28/2020 IV Flagyl 11/27/2020>>>> 11/30/2020 IV Rocephin 11/28/2020>>>>.  11/30/2020 IV vancomycin 11/30/2020>>> 12/01/2020 Amoxicillin 12/02/2020>>>> 12/11/2020   Subjective: Sleeping but arousable.  Denies any chest pain.  No shortness of breath.  No abdominal pain.  Tolerating diet.  Appreciative of the care he is receiving.  Off Bair hugger since early this morning.    Objective: Vitals:   12/02/20 0600 12/02/20 0700 12/02/20 0800 12/02/20 0900  BP: (!) 141/87 (!) 141/83 (!) 145/97 (!) 144/75  Pulse: 77 81 91 85  Resp: '18 14 11 '$ (!) 31  Temp: (!) 97.5 F (36.4 C) 98.1 F (36.7 C) 98.2 F (36.8 C) 98.2 F (36.8 C)  TempSrc: Bladder  Bladder   SpO2: 97% 98% 96% 98%  Weight:      Height:        Intake/Output Summary (Last 24 hours) at 12/02/2020 1052 Last data filed at 12/02/2020 1003 Gross per 24 hour  Intake 1886.23 ml  Output 1450 ml  Net 436.23  ml    Filed Weights   11/27/20 1244 11/27/20 1900 11/30/20 0500  Weight: 65.8 kg 65 kg 66 kg    Examination:  General exam: Cachectic.  Frail.  Emaciated.  Off Bair hugger this morning.  Respiratory system: Lungs clear to auscultation bilaterally anterior lung fields.  No wheezes, no crackles, no rhonchi.  Speaking in full sentences. Cardiovascular system: RRR no murmurs rubs or gallops.  No JVD.  2+ bilateral lower extremity edema R > L. Gastrointestinal system: Abdomen is soft, nontender, nondistended, scaphoid.  Positive bowel sounds.  No rebound.  No guarding.   Central nervous system: Alert. No focal neurological deficits. Extremities: 2+ bilateral lower extremity edema R > L.  Left upper extremity swelling. Skin: Petechiae on feet and lower extremities.  Bilateral hip wounds dried with some scabs noted.   Psychiatry: Judgement and insight appear fair. Mood & affect appropriate.     Data Reviewed: I have personally reviewed following labs and imaging studies  CBC: Recent Labs  Lab 11/27/20 1328 11/28/20 0254  11/28/20 1430 11/29/20 0905 11/30/20 0303 12/01/20 0302 12/02/20 0252  WBC 5.2 2.7*  --  5.2 5.8 4.5 5.3  NEUTROABS 5.0 2.5  --  4.6  --   --  4.6  HGB 8.6* 6.6* 9.6* 10.3* 9.9* 9.8* 10.0*  HCT 28.2* 20.5* 28.7* 30.2* 29.7* 30.6* 31.5*  MCV 80.3 76.2*  --  75.1* 76.5* 79.3* 80.6  PLT 161 124*  --  114* 101* 107* 118*     Basic Metabolic Panel: Recent Labs  Lab 11/27/20 1927 11/28/20 0254 11/29/20 0905 11/30/20 0303 12/01/20 0302 12/02/20 0252  NA 133* 137 135 137 136 138  K 4.7 4.2 3.3* 2.8* 3.2* 3.8  CL 107 111 101 99 104 110  CO2 15* 16* 21* '28 23 23  '$ GLUCOSE 130* 81 61* 70 146* 93  BUN 75* 80* 77* 72* 59* 49*  CREATININE 2.51* 2.55* 2.26* 2.20* 1.91* 1.57*  CALCIUM 8.0* 8.0* 7.6* 7.7* 7.6* 7.9*  MG 2.1 2.2 2.0 1.7 2.5* 2.2  PHOS 5.7* 5.7* 4.1 3.4  --  1.9*     GFR: Estimated Creatinine Clearance: 35.6 mL/min (A) (by C-G formula based on SCr of 1.57 mg/dL (H)).  Liver Function Tests: Recent Labs  Lab 11/27/20 1328 11/28/20 0254 11/29/20 0905 11/30/20 0303 12/01/20 0302 12/02/20 0252  AST 64* 99* 150* 140* 103*  --   ALT 32 34 45* 45* 41  --   ALKPHOS 92 75 73 72 63  --   BILITOT 1.1 0.8 0.8 0.5 0.6  --   PROT 5.4* 5.4* 5.2* 5.0* 4.8*  --   ALBUMIN 2.6* 3.0* 2.7* 2.6* 2.3* 2.3*     CBG: Recent Labs  Lab 12/01/20 0608 12/01/20 1208 12/01/20 1742 12/02/20 0004 12/02/20 0736  GLUCAP 121* 134* 138* 94 88      Recent Results (from the past 240 hour(s))  Blood Culture (routine x 2)     Status: None   Collection Time: 11/27/20  1:28 PM   Specimen: BLOOD  Result Value Ref Range Status   Specimen Description   Final    BLOOD ARM Performed at [facility] [address] [place], [place] [address]    Special Requests   Final    BOTTLES DRAWN AEROBIC AND ANAEROBIC Blood Culture results may not be optimal due to an inadequate volume of blood received in culture bottles Performed at [facility]  [address], [place], [place] [address]  Culture   Final    NO GROWTH 5 DAYS Performed at [facility], [place] [address], [place], [place] [address]    Report Status 12/02/2020 FINAL  Final  Blood Culture (routine x 2)     Status: Abnormal   Collection Time: 11/27/20  1:44 PM   Specimen: BLOOD  Result Value Ref Range Status   Specimen Description   Final    BLOOD ARM LEFT Performed at [facility] [address] [place], [place] [address]    Special Requests   Final    BOTTLES DRAWN AEROBIC AND ANAEROBIC Blood Culture results may not be optimal due to an inadequate volume of blood received in culture bottles Performed at [facility] [address], [place], [place] [address]    Culture  Setup Time (A)  Final    GRAM VARIABLE ROD ANAEROBIC BOTTLE ONLY CRITICAL RESULT CALLED TO, READ BACK BY AND VERIFIED WITHGuadlupe Spanish PHARMD [id number] 11/28/20 A BROWNING Performed at [facility], [place] [address] [place], [place] [address]    Culture (A)  Final    DIPHTHEROIDS(CORYNEBACTERIUM SPECIES) Standardized susceptibility testing for this organism is not available. CLOSTRIDIUM PERFRINGENS    Report Status 12/01/2020 FINAL  Final  Blood Culture ID Panel (Reflexed)     Status: None   Collection Time: 11/27/20  1:44 PM  Result Value Ref Range Status   Enterococcus faecalis NOT DETECTED NOT DETECTED Final   Enterococcus Faecium NOT DETECTED NOT DETECTED Final   Listeria monocytogenes NOT DETECTED NOT DETECTED Final   Staphylococcus species NOT DETECTED NOT DETECTED Final   Staphylococcus aureus (BCID) NOT DETECTED NOT DETECTED Final   Staphylococcus epidermidis NOT DETECTED NOT DETECTED Final   Staphylococcus lugdunensis NOT DETECTED NOT DETECTED Final   Streptococcus species NOT DETECTED NOT DETECTED Final   Streptococcus agalactiae NOT DETECTED NOT DETECTED Final   Streptococcus pneumoniae NOT DETECTED NOT DETECTED Final   Streptococcus  pyogenes NOT DETECTED NOT DETECTED Final   A.calcoaceticus-baumannii NOT DETECTED NOT DETECTED Final   Bacteroides fragilis NOT DETECTED NOT DETECTED Final   Enterobacterales NOT DETECTED NOT DETECTED Final   Enterobacter cloacae complex NOT DETECTED NOT DETECTED Final   Escherichia coli NOT DETECTED NOT DETECTED Final   Klebsiella aerogenes NOT DETECTED NOT DETECTED Final   Klebsiella oxytoca NOT DETECTED NOT DETECTED Final   Klebsiella pneumoniae NOT DETECTED NOT DETECTED Final   Proteus species NOT DETECTED NOT DETECTED Final   Salmonella species NOT DETECTED NOT DETECTED Final   Serratia marcescens NOT DETECTED NOT DETECTED Final   Haemophilus influenzae NOT DETECTED NOT DETECTED Final   Neisseria meningitidis NOT DETECTED NOT DETECTED Final   Pseudomonas aeruginosa NOT DETECTED NOT DETECTED Final   Stenotrophomonas maltophilia NOT DETECTED NOT DETECTED Final   Candida albicans NOT DETECTED NOT DETECTED Final   Candida auris NOT DETECTED NOT DETECTED Final   Candida glabrata NOT DETECTED NOT DETECTED Final   Candida krusei NOT DETECTED NOT DETECTED Final   Candida parapsilosis NOT DETECTED NOT DETECTED Final   Candida tropicalis NOT DETECTED NOT DETECTED Final   Cryptococcus neoformans/gattii NOT DETECTED NOT DETECTED Final    Comment: Performed at [facility], [address] [place], [place] [address]  Resp Panel by RT-PCR (Flu A&B, Covid) Nasopharyngeal Swab     Status: None   Collection Time: 11/27/20  1:46 PM   Specimen: Nasopharyngeal Swab; Nasopharyngeal(NP) swabs in vial transport medium  Result Value Ref Range Status  SARS Coronavirus 2 by RT PCR NEGATIVE NEGATIVE Final    Comment: (NOTE) SARS-CoV-2 target nucleic acids are NOT DETECTED.  The SARS-CoV-2 RNA is generally detectable in upper respiratory specimens during the acute phase of infection. The lowest concentration of SARS-CoV-2 viral copies this assay can detect is 138 copies/mL. A negative result does  not preclude SARS-Cov-2 infection and should not be used as the sole basis for treatment or other patient management decisions. A negative result may occur with  improper specimen collection/handling, submission of specimen other than nasopharyngeal swab, presence of viral mutation(s) within the areas targeted by this assay, and inadequate number of viral copies(<138 copies/mL). A negative result must be combined with clinical observations, patient history, and epidemiological information. The expected result is Negative.  Fact Sheet for Patients:  EntrepreneurPulse.com.au  Fact Sheet for Healthcare Providers:  IncredibleEmployment.be  This test is no t yet approved or cleared by the [place] FDA and  has been authorized for detection and/or diagnosis of SARS-CoV-2 by FDA under an Emergency Use Authorization (EUA). This EUA will remain  in effect (meaning this test can be used) for the duration of the COVID-19 declaration under Section 564(b)(1) of the Act, 21 U.S.C.section 360bbb-3(b)(1), unless the authorization is terminated  or revoked sooner.       Influenza A by PCR NEGATIVE NEGATIVE Final   Influenza B by PCR NEGATIVE NEGATIVE Final    Comment: (NOTE) The Xpert Xpress SARS-CoV-2/FLU/RSV plus assay is intended as an aid in the diagnosis of influenza from Nasopharyngeal swab specimens and should not be used as a sole basis for treatment. Nasal washings and aspirates are unacceptable for Xpert Xpress SARS-CoV-2/FLU/RSV testing.  Fact Sheet for Patients: EntrepreneurPulse.com.au  Fact Sheet for Healthcare Providers: IncredibleEmployment.be  This test is not yet approved or cleared by the [place] FDA and has been authorized for detection and/or diagnosis of SARS-CoV-2 by FDA under an Emergency Use Authorization (EUA). This EUA will remain in effect (meaning this test can be used) for the  duration of the COVID-19 declaration under Section 564(b)(1) of the Act, 21 U.S.C. section 360bbb-3(b)(1), unless the authorization is terminated or revoked.  Performed at [facility], [place] [address] [place], [place] [address]   Urine Culture     Status: Abnormal   Collection Time: 11/27/20  1:48 PM   Specimen: Urine, Catheterized  Result Value Ref Range Status   Specimen Description   Final    URINE, CATHETERIZED Performed at [facility] [address] [place], [place] [address]    Special Requests   Final    NONE Performed at [facility], [place] [address] [place], [place] [address]    Culture (A)  Final    >=100,000 COLONIES/mL AEROCOCCUS SPECIES Standardized susceptibility testing for this organism is not available. Performed at [facility], [place] [address], [place], [place] [address]    Report Status 11/29/2020 FINAL  Final  Gastrointestinal Panel by PCR , Stool     Status: None   Collection Time: 11/27/20  7:31 PM   Specimen: Stool  Result Value Ref Range Status   Campylobacter species NOT DETECTED NOT DETECTED Final   Plesimonas shigelloides NOT DETECTED NOT DETECTED Final   Salmonella species NOT DETECTED NOT DETECTED Final   Yersinia enterocolitica NOT DETECTED NOT DETECTED Final   Vibrio species NOT DETECTED NOT DETECTED Final   Vibrio cholerae NOT DETECTED NOT DETECTED Final   Enteroaggregative E coli (EAEC) NOT DETECTED  NOT DETECTED Final   Enteropathogenic E coli (EPEC) NOT DETECTED NOT DETECTED Final   Enterotoxigenic E coli (ETEC) NOT DETECTED NOT DETECTED Final   Shiga like toxin producing E coli (STEC) NOT DETECTED NOT DETECTED Final   Shigella/Enteroinvasive E coli (EIEC) NOT DETECTED NOT DETECTED Final   Cryptosporidium NOT DETECTED NOT DETECTED Final   Cyclospora cayetanensis NOT DETECTED NOT DETECTED Final   Entamoeba histolytica NOT DETECTED NOT DETECTED Final   Giardia lamblia NOT  DETECTED NOT DETECTED Final   Adenovirus F40/41 NOT DETECTED NOT DETECTED Final   Astrovirus NOT DETECTED NOT DETECTED Final   Norovirus GI/GII NOT DETECTED NOT DETECTED Final   Rotavirus A NOT DETECTED NOT DETECTED Final   Sapovirus (I, II, IV, and V) NOT DETECTED NOT DETECTED Final    Comment: Performed at [facility], [address] [place], [place] [address]  C Difficile Quick Screen w PCR reflex     Status: None   Collection Time: 11/27/20  7:32 PM   Specimen: STOOL  Result Value Ref Range Status   C Diff antigen NEGATIVE NEGATIVE Final   C Diff toxin NEGATIVE NEGATIVE Final   C Diff interpretation No C. difficile detected.  Final    Comment: Performed at [facility], [place] [address], [place], [place] [address]  MRSA Next Gen by PCR, Nasal     Status: None   Collection Time: 11/29/20  5:25 AM   Specimen: Nasal Mucosa; Nasal Swab  Result Value Ref Range Status   MRSA by PCR Next Gen NOT DETECTED NOT DETECTED Final    Comment: (NOTE) The GeneXpert MRSA Assay (FDA approved for NASAL specimens only), is one component of a comprehensive MRSA colonization surveillance program. It is not intended to diagnose MRSA infection nor to guide or monitor treatment for MRSA infections. Test performance is not FDA approved in patients less than 38 years old. Performed at [facility], [place] [address], [place],  [address]           Radiology Studies: US Abdomen Complete  Result Date: 11/30/2020 CLINICAL DATA:  Transaminitis. EXAM: ABDOMEN ULTRASOUND COMPLETE COMPARISON:  CT of the abdomen and pelvis on 08/17/2019 FINDINGS: Gallbladder: Gallbladder wall is UPPER normal, 3 millimeters. There is no sonographic Murphy's sign. In the gallbladder lumen, there is layering sludge without stones. Complex pericholecystic fluid collection noted. Common bile duct: Diameter: 5.0 millimeters Liver: Visualized portions of the liver are unremarkable.  LEFT hepatic lobe is difficult to visualized secondary to overlying bowel gas. Portal vein is patent on color Doppler imaging with normal direction of blood flow towards the liver. IVC: Not well seen Pancreas: Partially imaged.  No abnormality identified. Spleen: Not well seen. Right Kidney: Length: Not well seen. Left Kidney: Length: Not well seen. Abdominal aorta: Abdominal aortic aneurysm measures at least 3.2 centimeters. Mural thrombus again noted. Other findings: Study quality is degraded by immobility, difficulty holding breath, and overlying bowel gas. IMPRESSION: 1. Layering sludge within the gallbladder. 2. Complex small pericholecystic fluid collection noted, of uncertain significance. 3. Limited evaluation of the LEFT hepatic lobe, kidneys, pancreas, spleen, IVC. 4. 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 Electronically Signed   By: Nolon Nations M.D.   On: 11/30/2020 17:53        Scheduled Meds:  amoxicillin  1,000 mg Oral Q8H   chlorhexidine  15 mL Mouth Rinse BID   Chlorhexidine Gluconate Cloth  6 each Topical Daily  collagenase   Topical Daily   feeding supplement  1 Container Oral TID BM   feeding supplement  237 mL Oral Q24H   ferrous sulfate  325 mg Oral Q breakfast   finasteride  5 mg Oral Daily   folic acid  1 mg Oral Daily   heparin injection (subcutaneous)  5,000 Units Subcutaneous Q8H   mouth rinse  15 mL Mouth Rinse q12n4p   multivitamin with minerals  1 tablet Oral Daily   nutrition supplement (JUVEN)  1 packet Oral BID BM   pantoprazole  40 mg Oral BID   sodium chloride flush  3 mL Intravenous Q12H   umeclidinium bromide  1 puff Inhalation Daily   Continuous Infusions:  sodium chloride Stopped (12/02/20 0802)   dextrose 5 % and 0.9% NaCl 50 mL/hr at 12/02/20 0900   potassium PHOSPHATE IVPB (in mmol)       LOS: 5 days    Time spent: 45 minutes    Irine Seal, MD [facility]   To contact the attending provider between 7A-7P or the covering provider during after hours 7P-7A, please log into the web site [email or web address] and access using universal [place] password for that web site. If you do not have the password, please call the hospital operator.  12/02/2020, 10:52 AM

## 2020-12-02 NOTE — Progress Notes (Signed)
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 (dvt dx 04/2019 on xarelto; 06/2019 severe sepsis/uti-hematuria prompting ivc filter placement and cessation xarelto), and chronic urinary retention/bilateral hydronephrosis, admitted with severe sepsis/septic shock with hypothermia/aki in the setting of failure to thrive/generalized weakness and diarrhea, found to have clostridium perfringens bacteremia and complicated uti   [id number] bcx 1 of 2 set clostridium perfringens anaerobic bottle only 8/18 urine cultures aerococcus species 8/18 stool pcr negative; cdiff screen negative     Potentially 2 sources of sepsis include GU and GI although he recalled to me mainly GI sx. Both have a feasible clinical history explanation with his diarrhea and chronic urinary retention.    Patient has pcn allergy but do not recall when or even if he has it. Doesn't recall any anaphylaxis episode in his life     Has ivc filter/aorta mural thrombus, but no strong evidence this would requires longer treatment from c perfringen bsi  ---------- 8/23 assessment Continues to clinically improve, although overall appears chronically to have FTT Tolerated amoxicillin graded challenge on 8/22. Should be ok to take penicillin allergy off his medication list  Amoxicillin should cover both the aerococcus and the clostridium perfringens issue   Plan: Continue amoxicillin 1 gram three times a day Total abx duration 10 days from 8/22 Discussed with primary team  Id will sign off     Principal Problem:   Severe sepsis with acute organ dysfunction (HCC) Active Problems:   Hyperlipidemia   Essential hypertension   PERIPHERAL VASCULAR DISEASE   Benign prostatic hyperplasia   Diarrhea   Iron deficiency anemia due to chronic blood loss   ARF (acute renal failure) (HCC)   Hyperkalemia   Metabolic acidosis   Chronic indwelling Foley catheter   Complicated UTI (urinary tract infection)   Transaminitis   Severe protein-calorie malnutrition (HCC)   Bilateral edema of lower extremity   Hyperglycemia   Dehydration   Hypothermia   Bilateral hydronephrosis   Right leg DVT (HCC)   Pancytopenia (HCC)   Palliative care by specialist   Goals of care, counseling/discussion   General weakness   Bacteremia   Allergies  Allergen Reactions   Ace Inhibitors     REACTION: angioedema    Scheduled Meds:  amoxicillin  1,000 mg Oral Q8H   chlorhexidine  15 mL Mouth Rinse BID   Chlorhexidine Gluconate Cloth  6 each Topical Daily   collagenase   Topical Daily   feeding supplement  1 Container Oral TID BM   feeding supplement  237 mL Oral Q24H   ferrous sulfate  325 mg Oral Q breakfast   finasteride  5 mg Oral Daily   folic acid  1 mg Oral Daily   heparin injection (subcutaneous)  5,000 Units Subcutaneous Q8H   mouth rinse  15 mL Mouth Rinse q12n4p   multivitamin with minerals  1 tablet Oral Daily   nutrition supplement (JUVEN)  1 packet Oral BID BM   pantoprazole  40 mg Oral BID   sodium chloride flush  3 mL Intravenous Q12H   umeclidinium bromide  1 puff Inhalation Daily   Continuous Infusions:  sodium chloride Stopped (12/02/20 0802)   dextrose 5 % and 0.9% NaCl 50 mL/hr at 12/02/20 0900   potassium PHOSPHATE IVPB (in mmol) 30 mmol (12/02/20 1145)   PRN Meds:.sodium chloride, acetaminophen **OR** acetaminophen, albuterol, ipratropium, loperamide, metoprolol tartrate, ondansetron **OR** ondansetron (ZOFRAN) IV, senna-docusate, sorbitol   SUBJECTIVE: Doing well   No complaint Tolerated amox graded challenge Soft stool; no diarrhea No hematuria/dysuria  Review of Systems: ROS All other ROS was negative, except mentioned above     OBJECTIVE: Vitals:   12/02/20 0700 12/02/20 0800 12/02/20 0900 12/02/20 1200  BP: (!) 141/83 (!) 145/97 (!) 144/75   Pulse: 81 91 85 (!) 102  Resp: 14 11 (!) 31   Temp: 98.1 F (36.7 C) 98.2 F (36.8 C) 98.2 F (36.8 C)   TempSrc:  Bladder  Bladder  SpO2: 98% 96% 98% 99%  Weight:      Height:       Body mass index is 20.29 kg/m.  Physical Exam General/constitutional: pleasant; thin/no distress; conversant HEENT: Normocephalic, PER; poor dentition Neck supple CV: rrr no mrg Lungs: clear to auscultation, normal respiratory effort Abd: Soft, Nontender Ext: trace bilateral LE edema Skin: No Rash Neuro: nonfocal outside of generalized weakness MSK: no peripheral joint swelling/tenderness/warmth; back spines nontender    Lab Results Lab Results  Component Value Date   WBC 5.3 12/02/2020   HGB 10.0 (L) 12/02/2020   HCT 31.5 (L) 12/02/2020   MCV 80.6 12/02/2020   PLT 118 (L) 12/02/2020    Lab Results  Component Value Date   CREATININE 1.57 (H) 12/02/2020   BUN 49 (H) 12/02/2020   NA 138 12/02/2020   K 3.8 12/02/2020   CL 110 12/02/2020   CO2 23 12/02/2020    Lab Results  Component Value Date   ALT 41 12/01/2020   AST 103 (H) 12/01/2020   ALKPHOS 63 12/01/2020   BILITOT 0.6 12/01/2020      Microbiology: Recent Results (from the past 240 hour(s))  Blood Culture (routine x 2)     Status: None   Collection Time: 11/27/20  1:28 PM   Specimen: BLOOD  Result Value Ref Range Status   Specimen Description   Final    BLOOD ARM Performed at [facility], [place] [address], [place], [place] [address]    Special Requests   Final    BOTTLES DRAWN AEROBIC AND ANAEROBIC Blood Culture results may not be optimal due to an inadequate volume of blood received in culture  bottles Performed at [place] [address] [place], [place] [address]    Culture   Final    NO GROWTH 5 DAYS Performed at [facility], [place] [address], [place], [place] [address]    Report Status 12/02/2020 FINAL  Final  Blood Culture (routine x 2)     Status: Abnormal   Collection Time: 11/27/20  1:44 PM   Specimen: BLOOD  Result Value Ref Range Status   Specimen Description   Final    BLOOD ARM LEFT Performed at [place] [address], [place],  [address]    Special Requests  Final    BOTTLES DRAWN AEROBIC AND ANAEROBIC Blood Culture results may not be optimal due to an inadequate volume of blood received in culture bottles Performed at [facility], [place] [address], [place], [place] [address]    Culture  Setup Time (A)  Final    GRAM VARIABLE ROD ANAEROBIC BOTTLE ONLY CRITICAL RESULT CALLED TO, READ BACK BY AND VERIFIED WITHGuadlupe Spanish PHARMD [id number] 11/28/20 A BROWNING Performed at [facility], [place] [address], [place], [place] [address]    Culture (A)  Final    DIPHTHEROIDS(CORYNEBACTERIUM SPECIES) Standardized susceptibility testing for this organism is not available. CLOSTRIDIUM PERFRINGENS    Report Status 12/01/2020 FINAL  Final  Blood Culture ID Panel (Reflexed)     Status: None   Collection Time: 11/27/20  1:44 PM  Result Value Ref Range Status   Enterococcus faecalis NOT DETECTED NOT DETECTED Final   Enterococcus Faecium NOT DETECTED NOT DETECTED Final   Listeria monocytogenes NOT DETECTED NOT DETECTED Final   Staphylococcus species NOT DETECTED NOT DETECTED Final   Staphylococcus aureus (BCID) NOT DETECTED NOT DETECTED Final   Staphylococcus epidermidis NOT DETECTED NOT DETECTED Final   Staphylococcus lugdunensis NOT DETECTED NOT DETECTED Final   Streptococcus species NOT DETECTED NOT DETECTED Final   Streptococcus agalactiae NOT DETECTED NOT DETECTED Final   Streptococcus  pneumoniae NOT DETECTED NOT DETECTED Final   Streptococcus pyogenes NOT DETECTED NOT DETECTED Final   A.calcoaceticus-baumannii NOT DETECTED NOT DETECTED Final   Bacteroides fragilis NOT DETECTED NOT DETECTED Final   Enterobacterales NOT DETECTED NOT DETECTED Final   Enterobacter cloacae complex NOT DETECTED NOT DETECTED Final   Escherichia coli NOT DETECTED NOT DETECTED Final   Klebsiella aerogenes NOT DETECTED NOT DETECTED Final   Klebsiella oxytoca NOT DETECTED NOT DETECTED Final   Klebsiella pneumoniae NOT DETECTED NOT DETECTED Final   Proteus species NOT DETECTED NOT DETECTED Final   Salmonella species NOT DETECTED NOT DETECTED Final   Serratia marcescens NOT DETECTED NOT DETECTED Final   Haemophilus influenzae NOT DETECTED NOT DETECTED Final   Neisseria meningitidis NOT DETECTED NOT DETECTED Final   Pseudomonas aeruginosa NOT DETECTED NOT DETECTED Final   Stenotrophomonas maltophilia NOT DETECTED NOT DETECTED Final   Candida albicans NOT DETECTED NOT DETECTED Final   Candida auris NOT DETECTED NOT DETECTED Final   Candida glabrata NOT DETECTED NOT DETECTED Final   Candida krusei NOT DETECTED NOT DETECTED Final   Candida parapsilosis NOT DETECTED NOT DETECTED Final   Candida tropicalis NOT DETECTED NOT DETECTED Final   Cryptococcus neoformans/gattii NOT DETECTED NOT DETECTED Final    Comment: Performed at [facility], [address] [place], [place] [address]  Resp Panel by RT-PCR (Flu A&B, Covid) Nasopharyngeal Swab     Status: None   Collection Time: 11/27/20  1:46 PM   Specimen: Nasopharyngeal Swab; Nasopharyngeal(NP) swabs in vial transport medium  Result Value Ref Range Status   SARS Coronavirus 2 by RT PCR NEGATIVE NEGATIVE Final    Comment: (NOTE) SARS-CoV-2 target nucleic acids are NOT DETECTED.  The SARS-CoV-2 RNA is generally detectable in upper respiratory specimens during the acute phase of infection. The lowest concentration of SARS-CoV-2 viral copies this  assay can detect is 138 copies/mL. A negative result does not preclude SARS-Cov-2 infection and should not be used as the sole basis for treatment or other patient management decisions. A negative result may occur with  improper specimen collection/handling, submission of specimen other than nasopharyngeal swab, presence  of viral mutation(s) within the areas targeted by this assay, and inadequate number of viral copies(<138 copies/mL). A negative result must be combined with clinical observations, patient history, and epidemiological information. The expected result is Negative.  Fact Sheet for Patients:  EntrepreneurPulse.com.au  Fact Sheet for Healthcare Providers:  IncredibleEmployment.be  This test is no t yet approved or cleared by the [place] FDA and  has been authorized for detection and/or diagnosis of SARS-CoV-2 by FDA under an Emergency Use Authorization (EUA). This EUA will remain  in effect (meaning this test can be used) for the duration of the COVID-19 declaration under Section 564(b)(1) of the Act, 21 U.S.C.section 360bbb-3(b)(1), unless the authorization is terminated  or revoked sooner.       Influenza A by PCR NEGATIVE NEGATIVE Final   Influenza B by PCR NEGATIVE NEGATIVE Final    Comment: (NOTE) The Xpert Xpress SARS-CoV-2/FLU/RSV plus assay is intended as an aid in the diagnosis of influenza from Nasopharyngeal swab specimens and should not be used as a sole basis for treatment. Nasal washings and aspirates are unacceptable for Xpert Xpress SARS-CoV-2/FLU/RSV testing.  Fact Sheet for Patients: EntrepreneurPulse.com.au  Fact Sheet for Healthcare Providers: IncredibleEmployment.be  This test is not yet approved or cleared by the [place] FDA and has been authorized for detection and/or diagnosis of SARS-CoV-2 by FDA under an Emergency Use Authorization (EUA). This EUA will  remain in effect (meaning this test can be used) for the duration of the COVID-19 declaration under Section 564(b)(1) of the Act, 21 U.S.C. section 360bbb-3(b)(1), unless the authorization is terminated or revoked.  Performed at [facility], [place] [address], [place], [place] [address]   Urine Culture     Status: Abnormal   Collection Time: 11/27/20  1:48 PM   Specimen: Urine, Catheterized  Result Value Ref Range Status   Specimen Description   Final    URINE, CATHETERIZED Performed at [place] [address], [place], [place] [address]    Special Requests   Final    NONE Performed at [facility], [place] [address] [place], [place] [address]    Culture (A)  Final    >=100,000 COLONIES/mL AEROCOCCUS SPECIES Standardized susceptibility testing for this organism is not available. Performed at [facility], [place] [address] [place], [place] [address]    Report Status 11/29/2020 FINAL  Final  Gastrointestinal Panel by PCR , Stool     Status: None   Collection Time: 11/27/20  7:31 PM   Specimen: Stool  Result Value Ref Range Status   Campylobacter species NOT DETECTED NOT DETECTED Final   Plesimonas shigelloides NOT DETECTED NOT DETECTED Final   Salmonella species NOT DETECTED NOT DETECTED Final   Yersinia enterocolitica NOT DETECTED NOT DETECTED Final   Vibrio species NOT DETECTED NOT DETECTED Final   Vibrio cholerae NOT DETECTED NOT DETECTED Final   Enteroaggregative E coli (EAEC) NOT DETECTED NOT DETECTED Final   Enteropathogenic E coli (EPEC) NOT DETECTED NOT DETECTED Final   Enterotoxigenic E coli (ETEC) NOT DETECTED NOT DETECTED Final   Shiga like toxin producing E coli (STEC) NOT DETECTED NOT DETECTED Final   Shigella/Enteroinvasive E coli (EIEC) NOT DETECTED NOT DETECTED Final   Cryptosporidium NOT DETECTED NOT DETECTED Final   Cyclospora cayetanensis NOT DETECTED NOT DETECTED Final   Entamoeba histolytica  NOT DETECTED NOT DETECTED Final   Giardia lamblia NOT DETECTED NOT DETECTED Final   Adenovirus F40/41 NOT DETECTED NOT DETECTED Final  Astrovirus NOT DETECTED NOT DETECTED Final   Norovirus GI/GII NOT DETECTED NOT DETECTED Final   Rotavirus A NOT DETECTED NOT DETECTED Final   Sapovirus (I, II, IV, and V) NOT DETECTED NOT DETECTED Final    Comment: Performed at [facility], [address], [place], [place] [address]  C Difficile Quick Screen w PCR reflex     Status: None   Collection Time: 11/27/20  7:32 PM   Specimen: STOOL  Result Value Ref Range Status   C Diff antigen NEGATIVE NEGATIVE Final   C Diff toxin NEGATIVE NEGATIVE Final   C Diff interpretation No C. difficile detected.  Final    Comment: Performed at [facility], [place] [address] [place], [place] [address]  MRSA Next Gen by PCR, Nasal     Status: None   Collection Time: 11/29/20  5:25 AM   Specimen: Nasal Mucosa; Nasal Swab  Result Value Ref Range Status   MRSA by PCR Next Gen NOT DETECTED NOT DETECTED Final    Comment: (NOTE) The GeneXpert MRSA Assay (FDA approved for NASAL specimens only), is one component of a comprehensive MRSA colonization surveillance program. It is not intended to diagnose MRSA infection nor to guide or monitor treatment for MRSA infections. Test performance is not FDA approved in patients less than 42 years old. Performed at [facility], [place] [address] [place], [place] [address]      Serology:   Imaging: If present, new imagings (plain films, ct scans, and mri) have been personally visualized and interpreted; radiology reports have been reviewed. Decision making incorporated into the Impression / Recommendations.   Jabier Mutton, [place] for Infectious [place] [phone] pager    12/02/2020, 3:43 PM

## 2020-12-03 DIAGNOSIS — N1330 Unspecified hydronephrosis: Secondary | ICD-10-CM

## 2020-12-03 DIAGNOSIS — Z7189 Other specified counseling: Secondary | ICD-10-CM | POA: Diagnosis not present

## 2020-12-03 DIAGNOSIS — Z515 Encounter for palliative care: Secondary | ICD-10-CM | POA: Diagnosis not present

## 2020-12-03 DIAGNOSIS — I10 Essential (primary) hypertension: Secondary | ICD-10-CM

## 2020-12-03 DIAGNOSIS — N179 Acute kidney failure, unspecified: Secondary | ICD-10-CM | POA: Diagnosis not present

## 2020-12-03 DIAGNOSIS — A419 Sepsis, unspecified organism: Secondary | ICD-10-CM | POA: Diagnosis not present

## 2020-12-03 DIAGNOSIS — R7881 Bacteremia: Secondary | ICD-10-CM | POA: Diagnosis not present

## 2020-12-03 LAB — CBC WITH DIFFERENTIAL/PLATELET
Abs Immature Granulocytes: 0.02 10*3/uL (ref 0.00–0.07)
Basophils Absolute: 0 10*3/uL (ref 0.0–0.1)
Basophils Relative: 0 %
Eosinophils Absolute: 0.1 10*3/uL (ref 0.0–0.5)
Eosinophils Relative: 2 %
HCT: 30.3 % — ABNORMAL LOW (ref 39.0–52.0)
Hemoglobin: 9.5 g/dL — ABNORMAL LOW (ref 13.0–17.0)
Immature Granulocytes: 0 %
Lymphocytes Relative: 10 %
Lymphs Abs: 0.5 10*3/uL — ABNORMAL LOW (ref 0.7–4.0)
MCH: 25.9 pg — ABNORMAL LOW (ref 26.0–34.0)
MCHC: 31.4 g/dL (ref 30.0–36.0)
MCV: 82.6 fL (ref 80.0–100.0)
Monocytes Absolute: 0.3 10*3/uL (ref 0.1–1.0)
Monocytes Relative: 6 %
Neutro Abs: 4 10*3/uL (ref 1.7–7.7)
Neutrophils Relative %: 82 %
Platelets: 131 10*3/uL — ABNORMAL LOW (ref 150–400)
RBC: 3.67 MIL/uL — ABNORMAL LOW (ref 4.22–5.81)
RDW: 22.8 % — ABNORMAL HIGH (ref 11.5–15.5)
WBC: 4.9 10*3/uL (ref 4.0–10.5)
nRBC: 0.4 % — ABNORMAL HIGH (ref 0.0–0.2)

## 2020-12-03 LAB — COMPREHENSIVE METABOLIC PANEL
ALT: 35 U/L (ref 0–44)
AST: 64 U/L — ABNORMAL HIGH (ref 15–41)
Albumin: 2.2 g/dL — ABNORMAL LOW (ref 3.5–5.0)
Alkaline Phosphatase: 67 U/L (ref 38–126)
Anion gap: 6 (ref 5–15)
BUN: 44 mg/dL — ABNORMAL HIGH (ref 8–23)
CO2: 24 mmol/L (ref 22–32)
Calcium: 8 mg/dL — ABNORMAL LOW (ref 8.9–10.3)
Chloride: 109 mmol/L (ref 98–111)
Creatinine, Ser: 1.43 mg/dL — ABNORMAL HIGH (ref 0.61–1.24)
GFR, Estimated: 50 mL/min — ABNORMAL LOW (ref >60)
Glucose, Bld: 146 mg/dL — ABNORMAL HIGH (ref 70–99)
Potassium: 3.8 mmol/L (ref 3.5–5.1)
Sodium: 139 mmol/L (ref 135–145)
Total Bilirubin: 0.8 mg/dL (ref 0.3–1.2)
Total Protein: 4.7 g/dL — ABNORMAL LOW (ref 6.5–8.1)

## 2020-12-03 LAB — GLUCOSE, CAPILLARY
Glucose-Capillary: 116 mg/dL — ABNORMAL HIGH (ref 70–99)
Glucose-Capillary: 117 mg/dL — ABNORMAL HIGH (ref 70–99)
Glucose-Capillary: 158 mg/dL — ABNORMAL HIGH (ref 70–99)

## 2020-12-03 LAB — MAGNESIUM: Magnesium: 1.9 mg/dL (ref 1.7–2.4)

## 2020-12-03 LAB — PHOSPHORUS: Phosphorus: 2.3 mg/dL — ABNORMAL LOW (ref 2.5–4.6)

## 2020-12-03 NOTE — Progress Notes (Signed)
Patient ID: Henry Carter, male   DOB: 1940/07/08, 80 y.o.   MRN: [id number]  PROGRESS NOTE    Joby Wint  [id number] DOB: 05-24-1940 DOA: 11/27/2020 PCP: [name], MD   Brief Narrative:  80 year old male with history of iron deficiency anemia, BPH, chronic bladder outlet obstruction, COPD, hypertension, GERD, PVD presented with failure to thrive and family not being able to take care of patient at home anymore.  On presentation, he was profoundly hypothermic with temperature of 85 with mottled lower extremities and noted to be covered in stool and bedbugs with bilateral hip wounds.  He was admitted with severe sepsis and empirically started on IV antibiotics and Bair hugger.  During the hospitalization, he was found to have Clostridium perfringens bacteremia and Aerococcus UTI.  Antibiotics were subsequently narrowed and then switched to oral amoxicillin as per ID recommendations.  He was also found to have AKI and treated with IV fluids.  Temperature gradually improved.  TOC following for SNF placement.  Palliative care also consulted for goals of care discussion.  Assessment & Plan:   Severe sepsis: Present on admission Clostridium perfringens bacteremia Aerococcus UTI -Patient was started on broad-spectrum antibiotics on presentation but antibiotics subsequently narrowed down as per ID recommendations.  Currently on oral amoxicillin and ID recommended to complete 10-day course of antibiotics from 12/01/2020 till 12/11/2020. -Sepsis has resolved.  PCCM has signed off  Hypothermia -Possibly from above, poor oral intake.  Treated with Retail banker.  Temperature much improved.  Transfer patient to [place].  Hypotension -Resolved.  Blood pressure on the higher side.  DC IV fluids  Acute renal failure Acute metabolic acidosis Dehydration Bilateral hydronephrosis/BPH/urinary retention/chronic bladder outlet obstruction -Possibly prerenal from poor oral intake, diarrhea,  severe sepsis and postrenal from hydronephrosis/BPH. -CT of the abdomen and pelvis on 08/17/2019 showed chronic bladder outlet obstruction with bilateral hydronephrosis and hydroureter -Continue Foley catheter.  Urology recommended to continue Foley catheter and discharge with indwelling Foley with outpatient follow-up with urology -Creatinine has much improved: 1.43 today.  Creatinine had peaked up to 2.66. -DC IV fluids.  Diarrhea -Questionable cause.  Stool for GI pathogen panel PCR and C. difficile negative -Diarrhea much improved.  Rectal tube has been removed.  Continue Imodium as needed  Acute right lower extremity DVT -Patient has history of lower extremity DVT along with IVC filter insertion on 06/11/2019 because of history of DVT with GI bleed  -Patient not started on anticoagulation because of prior history of GI bleed and patient already has IVC filter.  Prior hospitalist discussed with vascular surgery as well.  Transaminitis -Improving.  Hepatitis panel negative.  -Abdominal ultrasound with layering sludge within the gallbladder, complex small pericholecystic fluid collection noted of uncertain significance, limited evaluation of left hepatic lobe, kidneys, pancreas, spleen, IVC.  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    Anemia of chronic disease Thrombocytopenia -Hemoglobin and platelets stable currently.  No signs of bleeding. -GI evaluated the patient during this hospitalization and recommended no endoscopy procedures: Recommended supportive treatment, palliative care evaluation -Currently on Protonix twice a day.  Severe protein calorie malnutrition/failure to thrive Generalized deconditioning -Follow nutrition recommendations.  Tolerating diet as per SLP recommendations. -Overall prognosis is guarded to poor.  Palliative care following intermittently.  Patient is currently DNR.  Will need outpatient palliative care follow-up if condition  were to worsen -Patient has apparently been bedbound for several months with minimal oral intake  Abdominal aortic aneurysm at least 3.2  cm with significant mural thrombus -Noted to have been there at least 15 months ago and chronic and stable per vascular surgery. -Prior hospitalist discussed with vascular surgery, Dr. Donzetta Matters who reviewed films and felt no further intervention or recommendations needed at this time.  DVT prophylaxis: Heparin Code Status: DNR Family Communication: None at bedside Disposition Plan: Status is: Inpatient  Remains inpatient appropriate because:Inpatient level of care appropriate due to severity of illness  Dispo: The patient is from: Home              Anticipated d/c is to: SNF              Patient currently is medically stable to d/c.   Difficult to place patient No   Consultants: PCCM/urology/GI/palliative care/ID/curb sided vascular surgery by prior hospitalist  Procedures: None  Antimicrobials:  Anti-infectives (From admission, onward)    Start     Dose/Rate Route Frequency Ordered Stop   12/02/20 1400  amoxicillin (AMOXIL) capsule 1,000 mg        1,000 mg Oral Every 8 hours 12/02/20 0749 12/11/20 2359   12/01/20 1145  amoxicillin (AMOXIL) capsule 500 mg  Status:  Discontinued        500 mg Oral Every 12 hours 12/01/20 1053 12/02/20 0749   11/30/20 0945  vancomycin (VANCOREADY) IVPB 1250 mg/250 mL  Status:  Discontinued        1,250 mg 166.7 mL/hr over 90 Minutes Intravenous Every 48 hours 11/30/20 0859 12/01/20 1053   11/29/20 1400  vancomycin (VANCOCIN) IVPB 1000 mg/200 mL premix  Status:  Discontinued        1,000 mg 200 mL/hr over 60 Minutes Intravenous Every 48 hours 11/27/20 1926 11/28/20 1324   11/28/20 1415  cefTRIAXone (ROCEPHIN) 2 g in sodium chloride 0.9 % 100 mL IVPB  Status:  Discontinued        2 g 200 mL/hr over 30 Minutes Intravenous Every 24 hours 11/28/20 1324 11/30/20 0954   11/27/20 2200  metroNIDAZOLE (FLAGYL) IVPB 500 mg   Status:  Discontinued        500 mg 100 mL/hr over 60 Minutes Intravenous Every 12 hours 11/27/20 1833 11/30/20 0954   11/27/20 2200  aztreonam (AZACTAM) 1 g in sodium chloride 0.9 % 100 mL IVPB  Status:  Discontinued        1 g 200 mL/hr over 30 Minutes Intravenous Every 8 hours 11/27/20 1920 11/28/20 1324   11/27/20 1930  aztreonam (AZACTAM) 2 g in sodium chloride 0.9 % 100 mL IVPB  Status:  Discontinued        2 g 200 mL/hr over 30 Minutes Intravenous  Once 11/27/20 1833 11/27/20 1852   11/27/20 1930  vancomycin (VANCOCIN) IVPB 1000 mg/200 mL premix  Status:  Discontinued        1,000 mg 200 mL/hr over 60 Minutes Intravenous  Once 11/27/20 1833 11/27/20 1853   11/27/20 1345  vancomycin (VANCOREADY) IVPB 1250 mg/250 mL        1,250 mg 166.7 mL/hr over 90 Minutes Intravenous  Once 11/27/20 1344 11/27/20 1635   11/27/20 1315  aztreonam (AZACTAM) 2 g in sodium chloride 0.9 % 100 mL IVPB        2 g 200 mL/hr over 30 Minutes Intravenous  Once 11/27/20 1306 11/27/20 1411   11/27/20 1315  metroNIDAZOLE (FLAGYL) IVPB 500 mg        500 mg 100 mL/hr over 60 Minutes Intravenous  Once 11/27/20 1306 11/27/20 1821   11/27/20  1315  vancomycin (VANCOCIN) IVPB 1000 mg/200 mL premix  Status:  Discontinued        1,000 mg 200 mL/hr over 60 Minutes Intravenous  Once 11/27/20 1306 11/27/20 1344        Subjective: Patient seen and examined at bedside.  Poor historian.  No overnight fever, vomiting, seizures reported.  Objective: Vitals:   12/03/20 0200 12/03/20 0500 12/03/20 0600 12/03/20 0800  BP: 137/80 (!) 149/83 (!) 143/85 126/87  Pulse:      Resp: 11 13 (!) 0 (!) 9  Temp: (!) 97.5 F (36.4 C) (!) 97.3 F (36.3 C) (!) 97.5 F (36.4 C) 97.7 F (36.5 C)  TempSrc:      SpO2:      Weight:      Height:        Intake/Output Summary (Last 24 hours) at 12/03/2020 1112 Last data filed at 12/03/2020 0643 Gross per 24 hour  Intake 805.27 ml  Output 1325 ml  Net -519.73 ml   Filed Weights    11/27/20 1244 11/27/20 1900 11/30/20 0500  Weight: 65.8 kg 65 kg 66 kg    Examination:  General exam: Appears calm and comfortable.  Looks chronically ill and deconditioned.  Cachectic.  Currently on room air. Respiratory system: Bilateral decreased breath sounds at bases Cardiovascular system: S1 & S2 heard, Rate controlled Gastrointestinal system: Abdomen is nondistended, soft and nontender. Normal bowel sounds heard. Extremities: No cyanosis, clubbing; bilateral lower extremities edema present Central nervous system: Wakes up slightly, very slow to respond.  Extremely poor historian.  No focal neurological deficits. Moving extremities Skin: Petechiae on feet and lower extremities present Psychiatry: Could not be assessed because of mental status.  Hardly participates in any conversation.    Data Reviewed: I have personally reviewed following labs and imaging studies  CBC: Recent Labs  Lab 11/27/20 1328 11/28/20 0254 11/28/20 1430 11/29/20 0905 11/30/20 0303 12/01/20 0302 12/02/20 0252 12/03/20 0500  WBC 5.2 2.7*  --  5.2 5.8 4.5 5.3 4.9  NEUTROABS 5.0 2.5  --  4.6  --   --  4.6 4.0  HGB 8.6* 6.6*   < > 10.3* 9.9* 9.8* 10.0* 9.5*  HCT 28.2* 20.5*   < > 30.2* 29.7* 30.6* 31.5* 30.3*  MCV 80.3 76.2*  --  75.1* 76.5* 79.3* 80.6 82.6  PLT 161 124*  --  114* 101* 107* 118* 131*   < > = values in this interval not displayed.   Basic Metabolic Panel: Recent Labs  Lab 11/28/20 0254 11/29/20 0905 11/30/20 0303 12/01/20 0302 12/02/20 0252 12/03/20 0733  NA 137 135 137 136 138 139  K 4.2 3.3* 2.8* 3.2* 3.8 3.8  CL 111 101 99 104 110 109  CO2 16* 21* '28 23 23 24  '$ GLUCOSE 81 61* 70 146* 93 146*  BUN 80* 77* 72* 59* 49* 44*  CREATININE 2.55* 2.26* 2.20* 1.91* 1.57* 1.43*  CALCIUM 8.0* 7.6* 7.7* 7.6* 7.9* 8.0*  MG 2.2 2.0 1.7 2.5* 2.2 1.9  PHOS 5.7* 4.1 3.4  --  1.9* 2.3*   GFR: Estimated Creatinine Clearance: 39.1 mL/min (A) (by C-G formula based on SCr of 1.43 mg/dL  (H)). Liver Function Tests: Recent Labs  Lab 11/28/20 0254 11/29/20 0905 11/30/20 0303 12/01/20 0302 12/02/20 0252 12/03/20 0733  AST 99* 150* 140* 103*  --  64*  ALT 34 45* 45* 41  --  35  ALKPHOS 75 73 72 63  --  67  BILITOT 0.8 0.8 0.5 0.6  --  0.8  PROT 5.4* 5.2* 5.0* 4.8*  --  4.7*  ALBUMIN 3.0* 2.7* 2.6* 2.3* 2.3* 2.2*   No results for input(s): LIPASE, AMYLASE in the last 168 hours. No results for input(s): AMMONIA in the last 168 hours. Coagulation Profile: Recent Labs  Lab 11/27/20 1328  INR 1.1   Cardiac Enzymes: No results for input(s): CKTOTAL, CKMB, CKMBINDEX, TROPONINI in the last 168 hours. BNP (last 3 results) No results for input(s): PROBNP in the last 8760 hours. HbA1C: No results for input(s): HGBA1C in the last 72 hours. CBG: Recent Labs  Lab 12/02/20 0004 12/02/20 0736 12/02/20 1124 12/02/20 1759 12/02/20 2350  GLUCAP 94 88 155* 125* 79   Lipid Profile: No results for input(s): CHOL, HDL, LDLCALC, TRIG, CHOLHDL, LDLDIRECT in the last 72 hours. Thyroid Function Tests: No results for input(s): TSH, T4TOTAL, FREET4, T3FREE, THYROIDAB in the last 72 hours. Anemia Panel: No results for input(s): VITAMINB12, FOLATE, FERRITIN, TIBC, IRON, RETICCTPCT in the last 72 hours. Sepsis Labs: Recent Labs  Lab 11/27/20 1706 11/27/20 1927 11/27/20 2352 11/28/20 0254  PROCALCITON  --  4.50  --   --   LATICACIDVEN >11.0* 2.1* 1.0 1.0    Recent Results (from the past 240 hour(s))  Blood Culture (routine x 2)     Status: None   Collection Time: 11/27/20  1:28 PM   Specimen: BLOOD  Result Value Ref Range Status   Specimen Description   Final    BLOOD ARM Performed at [facility] [address], [place], [place] [address]    Special Requests   Final    BOTTLES DRAWN AEROBIC AND ANAEROBIC Blood Culture results may not be optimal due to an inadequate volume of blood received in culture bottles Performed at [facility] [address], [place], [place] [address]    Culture   Final    NO GROWTH 5 DAYS Performed at [facility], [place] [address], [place], [place] [address]    Report Status 12/02/2020 FINAL  Final  Blood Culture (routine x 2)     Status: Abnormal   Collection Time: 11/27/20  1:44 PM   Specimen: BLOOD  Result Value Ref Range Status   Specimen Description   Final    BLOOD ARM LEFT Performed at [facility] [address], [place], [place] [address]    Special Requests   Final    BOTTLES DRAWN AEROBIC AND ANAEROBIC Blood Culture results may not be optimal due to an inadequate volume of blood received in culture bottles Performed at [facility] [address] [place], [place] [address]    Culture  Setup Time (A)  Final    GRAM VARIABLE ROD ANAEROBIC BOTTLE ONLY CRITICAL RESULT CALLED TO, READ BACK BY AND VERIFIED WITHGuadlupe Spanish PHARMD [id number] 11/28/20 A BROWNING Performed at [facility], [place] [address] [place], [place] [address]    Culture (A)  Final    DIPHTHEROIDS(CORYNEBACTERIUM SPECIES) Standardized susceptibility testing for this organism is not available. CLOSTRIDIUM PERFRINGENS    Report Status 12/01/2020 FINAL  Final  Blood Culture ID Panel (Reflexed)     Status: None   Collection Time: 11/27/20  1:44 PM  Result Value Ref Range Status   Enterococcus faecalis NOT DETECTED NOT DETECTED Final   Enterococcus Faecium NOT DETECTED NOT DETECTED Final   Listeria monocytogenes NOT DETECTED NOT DETECTED Final   Staphylococcus species NOT DETECTED NOT DETECTED Final   Staphylococcus  aureus (BCID) NOT DETECTED NOT DETECTED Final   Staphylococcus epidermidis NOT DETECTED NOT DETECTED Final   Staphylococcus lugdunensis NOT DETECTED NOT DETECTED Final   Streptococcus species NOT DETECTED NOT DETECTED Final   Streptococcus agalactiae NOT DETECTED NOT DETECTED Final   Streptococcus pneumoniae NOT DETECTED NOT DETECTED Final    Streptococcus pyogenes NOT DETECTED NOT DETECTED Final   A.calcoaceticus-baumannii NOT DETECTED NOT DETECTED Final   Bacteroides fragilis NOT DETECTED NOT DETECTED Final   Enterobacterales NOT DETECTED NOT DETECTED Final   Enterobacter cloacae complex NOT DETECTED NOT DETECTED Final   Escherichia coli NOT DETECTED NOT DETECTED Final   Klebsiella aerogenes NOT DETECTED NOT DETECTED Final   Klebsiella oxytoca NOT DETECTED NOT DETECTED Final   Klebsiella pneumoniae NOT DETECTED NOT DETECTED Final   Proteus species NOT DETECTED NOT DETECTED Final   Salmonella species NOT DETECTED NOT DETECTED Final   Serratia marcescens NOT DETECTED NOT DETECTED Final   Haemophilus influenzae NOT DETECTED NOT DETECTED Final   Neisseria meningitidis NOT DETECTED NOT DETECTED Final   Pseudomonas aeruginosa NOT DETECTED NOT DETECTED Final   Stenotrophomonas maltophilia NOT DETECTED NOT DETECTED Final   Candida albicans NOT DETECTED NOT DETECTED Final   Candida auris NOT DETECTED NOT DETECTED Final   Candida glabrata NOT DETECTED NOT DETECTED Final   Candida krusei NOT DETECTED NOT DETECTED Final   Candida parapsilosis NOT DETECTED NOT DETECTED Final   Candida tropicalis NOT DETECTED NOT DETECTED Final   Cryptococcus neoformans/gattii NOT DETECTED NOT DETECTED Final    Comment: Performed at [facility], [address] [place], [place] [address]  Resp Panel by RT-PCR (Flu A&B, Covid) Nasopharyngeal Swab     Status: None   Collection Time: 11/27/20  1:46 PM   Specimen: Nasopharyngeal Swab; Nasopharyngeal(NP) swabs in vial transport medium  Result Value Ref Range Status   SARS Coronavirus 2 by RT PCR NEGATIVE NEGATIVE Final    Comment: (NOTE) SARS-CoV-2 target nucleic acids are NOT DETECTED.  The SARS-CoV-2 RNA is generally detectable in upper respiratory specimens during the acute phase of infection. The lowest concentration of SARS-CoV-2 viral copies this assay can detect is 138 copies/mL. A  negative result does not preclude SARS-Cov-2 infection and should not be used as the sole basis for treatment or other patient management decisions. A negative result may occur with  improper specimen collection/handling, submission of specimen other than nasopharyngeal swab, presence of viral mutation(s) within the areas targeted by this assay, and inadequate number of viral copies(<138 copies/mL). A negative result must be combined with clinical observations, patient history, and epidemiological information. The expected result is Negative.  Fact Sheet for Patients:  EntrepreneurPulse.com.au  Fact Sheet for Healthcare Providers:  IncredibleEmployment.be  This test is no t yet approved or cleared by the [place] FDA and  has been authorized for detection and/or diagnosis of SARS-CoV-2 by FDA under an Emergency Use Authorization (EUA). This EUA will remain  in effect (meaning this test can be used) for the duration of the COVID-19 declaration under Section 564(b)(1) of the Act, 21 U.S.C.section 360bbb-3(b)(1), unless the authorization is terminated  or revoked sooner.       Influenza A by PCR NEGATIVE NEGATIVE Final   Influenza B by PCR NEGATIVE NEGATIVE Final    Comment: (NOTE) The Xpert Xpress SARS-CoV-2/FLU/RSV plus assay is intended as an aid in the diagnosis of influenza from Nasopharyngeal swab specimens and should not be used as a sole basis for treatment. Nasal washings and aspirates are  unacceptable for Xpert Xpress SARS-CoV-2/FLU/RSV testing.  Fact Sheet for Patients: EntrepreneurPulse.com.au  Fact Sheet for Healthcare Providers: IncredibleEmployment.be  This test is not yet approved or cleared by the [place] FDA and has been authorized for detection and/or diagnosis of SARS-CoV-2 by FDA under an Emergency Use Authorization (EUA). This EUA will remain in effect (meaning this test can  be used) for the duration of the COVID-19 declaration under Section 564(b)(1) of the Act, 21 U.S.C. section 360bbb-3(b)(1), unless the authorization is terminated or revoked.  Performed at [facility], [place] [address], [place], [place] [address]   Urine Culture     Status: Abnormal   Collection Time: 11/27/20  1:48 PM   Specimen: Urine, Catheterized  Result Value Ref Range Status   Specimen Description   Final    URINE, CATHETERIZED Performed at [facility] [address] [place], [place] [address]    Special Requests   Final    NONE Performed at [facility], [place] [address] [place], [place] [address]    Culture (A)  Final    >=100,000 COLONIES/mL AEROCOCCUS SPECIES Standardized susceptibility testing for this organism is not available. Performed at [facility], [place] [address], [place], [place] [address]    Report Status 11/29/2020 FINAL  Final  Gastrointestinal Panel by PCR , Stool     Status: None   Collection Time: 11/27/20  7:31 PM   Specimen: Stool  Result Value Ref Range Status   Campylobacter species NOT DETECTED NOT DETECTED Final   Plesimonas shigelloides NOT DETECTED NOT DETECTED Final   Salmonella species NOT DETECTED NOT DETECTED Final   Yersinia enterocolitica NOT DETECTED NOT DETECTED Final   Vibrio species NOT DETECTED NOT DETECTED Final   Vibrio cholerae NOT DETECTED NOT DETECTED Final   Enteroaggregative E coli (EAEC) NOT DETECTED NOT DETECTED Final   Enteropathogenic E coli (EPEC) NOT DETECTED NOT DETECTED Final   Enterotoxigenic E coli (ETEC) NOT DETECTED NOT DETECTED Final   Shiga like toxin producing E coli (STEC) NOT DETECTED NOT DETECTED Final   Shigella/Enteroinvasive E coli (EIEC) NOT DETECTED NOT DETECTED Final   Cryptosporidium NOT DETECTED NOT DETECTED Final   Cyclospora cayetanensis NOT DETECTED NOT DETECTED Final   Entamoeba histolytica NOT DETECTED NOT DETECTED Final    Giardia lamblia NOT DETECTED NOT DETECTED Final   Adenovirus F40/41 NOT DETECTED NOT DETECTED Final   Astrovirus NOT DETECTED NOT DETECTED Final   Norovirus GI/GII NOT DETECTED NOT DETECTED Final   Rotavirus A NOT DETECTED NOT DETECTED Final   Sapovirus (I, II, IV, and V) NOT DETECTED NOT DETECTED Final    Comment: Performed at [facility], [address] [place], [place] [address]  C Difficile Quick Screen w PCR reflex     Status: None   Collection Time: 11/27/20  7:32 PM   Specimen: STOOL  Result Value Ref Range Status   C Diff antigen NEGATIVE NEGATIVE Final   C Diff toxin NEGATIVE NEGATIVE Final   C Diff interpretation No C. difficile detected.  Final    Comment: Performed at [facility], [place] [address] [place], [place] [address]  MRSA Next Gen by PCR, Nasal     Status: None   Collection Time: 11/29/20  5:25 AM   Specimen: Nasal Mucosa; Nasal Swab  Result Value Ref Range Status   MRSA by PCR Next Gen NOT DETECTED NOT DETECTED Final    Comment: (NOTE) The GeneXpert MRSA Assay (  FDA approved for NASAL specimens only), is one component of a comprehensive MRSA colonization surveillance program. It is not intended to diagnose MRSA infection nor to guide or monitor treatment for MRSA infections. Test performance is not FDA approved in patients less than 57 years old. Performed at [facility], [place] [address], [place], [place] [address]          Radiology Studies: No results found.      Scheduled Meds:  amoxicillin  1,000 mg Oral Q8H   chlorhexidine  15 mL Mouth Rinse BID   Chlorhexidine Gluconate Cloth  6 each Topical Daily   collagenase   Topical Daily   feeding supplement  1 Container Oral TID BM   feeding supplement  237 mL Oral Q24H   ferrous sulfate  325 mg Oral Q breakfast   finasteride  5 mg Oral Daily   folic acid  1 mg Oral Daily   heparin injection (subcutaneous)  5,000 Units Subcutaneous Q8H   mouth rinse   15 mL Mouth Rinse q12n4p   multivitamin with minerals  1 tablet Oral Daily   nutrition supplement (JUVEN)  1 packet Oral BID BM   pantoprazole  40 mg Oral BID   sodium chloride flush  3 mL Intravenous Q12H   umeclidinium bromide  1 puff Inhalation Daily   Continuous Infusions:  sodium chloride Stopped (12/02/20 1900)          Aline August, MD [facility] 12/03/2020, 11:12 AM

## 2020-12-04 DIAGNOSIS — Z7189 Other specified counseling: Secondary | ICD-10-CM | POA: Diagnosis not present

## 2020-12-04 DIAGNOSIS — R531 Weakness: Secondary | ICD-10-CM | POA: Diagnosis not present

## 2020-12-04 DIAGNOSIS — Z978 Presence of other specified devices: Secondary | ICD-10-CM

## 2020-12-04 DIAGNOSIS — N390 Urinary tract infection, site not specified: Secondary | ICD-10-CM | POA: Diagnosis not present

## 2020-12-04 DIAGNOSIS — A419 Sepsis, unspecified organism: Secondary | ICD-10-CM | POA: Diagnosis not present

## 2020-12-04 DIAGNOSIS — R7881 Bacteremia: Secondary | ICD-10-CM | POA: Diagnosis not present

## 2020-12-04 LAB — GLUCOSE, CAPILLARY
Glucose-Capillary: 146 mg/dL — ABNORMAL HIGH (ref 70–99)
Glucose-Capillary: 160 mg/dL — ABNORMAL HIGH (ref 70–99)

## 2020-12-04 MED ORDER — ALBUTEROL SULFATE HFA 108 (90 BASE) MCG/ACT IN AERS: 2.0000 | INHALATION_SPRAY | Freq: Four times a day (QID) | RESPIRATORY_TRACT | 0 refills | Status: AC | PRN

## 2020-12-04 MED ORDER — LOPERAMIDE HCL 2 MG PO CAPS: 2.0000 mg | ORAL_CAPSULE | ORAL | 0 refills | Status: AC | PRN

## 2020-12-04 MED ORDER — PANTOPRAZOLE SODIUM 40 MG PO TBEC: 40.0000 mg | DELAYED_RELEASE_TABLET | Freq: Every day | ORAL | 0 refills | Status: AC

## 2020-12-04 MED ORDER — FOLIC ACID 1 MG PO TABS: 1.0000 mg | ORAL_TABLET | Freq: Every day | ORAL | 0 refills | Status: AC

## 2020-12-04 MED ORDER — COLLAGENASE 250 UNIT/GM EX OINT: TOPICAL_OINTMENT | Freq: Every day | CUTANEOUS | 0 refills | Status: AC

## 2020-12-04 MED ORDER — ADULT MULTIVITAMIN W/MINERALS CH: 1.0000 | ORAL_TABLET | Freq: Every day | ORAL | Status: AC

## 2020-12-04 MED ORDER — AMOXICILLIN 500 MG PO CAPS: 1000.0000 mg | ORAL_CAPSULE | Freq: Three times a day (TID) | ORAL | 0 refills | Status: AC

## 2020-12-04 NOTE — Progress Notes (Signed)
Daily Progress Note   Patient Name: Henry Carter       Date: 12/04/2020 DOB: 1940-08-30  Age: 80 y.o. MRN#: [id number] Attending Physician: Aline August, MD Primary Care Physician: [name], MD Admit Date: 11/27/2020  Reason for Consultation/Follow-up: Establishing goals of care  Subjective: I saw and examined Henry Carter.  He was lying in bed in no distress.  He was awake and alert and answers simple questions.  He does not really participate in conversation regarding goals of care or long-term care planning.  I called and reached his wife.  She expressed that she thinks he has been looking like he is feeling better the past few days.  Attempted to explore long-term goals with her.  She tells me today that she is thinking that she wants to work to bring him home but she will need home health assistance.  Have been looking to explore options for skilled placement and I told her I would reach out to [facility] tomorrow to discuss options further.  Length of Stay: 7  Current Medications: Scheduled Meds:   amoxicillin  1,000 mg Oral Q8H   chlorhexidine  15 mL Mouth Rinse BID   Chlorhexidine Gluconate Cloth  6 each Topical Daily   collagenase   Topical Daily   feeding supplement  1 Container Oral TID BM   feeding supplement  237 mL Oral Q24H   ferrous sulfate  325 mg Oral Q breakfast   finasteride  5 mg Oral Daily   folic acid  1 mg Oral Daily   heparin injection (subcutaneous)  5,000 Units Subcutaneous Q8H   mouth rinse  15 mL Mouth Rinse q12n4p   multivitamin with minerals  1 tablet Oral Daily   nutrition supplement (JUVEN)  1 packet Oral BID BM   pantoprazole  40 mg Oral BID   sodium chloride flush  3 mL Intravenous Q12H   umeclidinium bromide  1 puff Inhalation Daily     Continuous Infusions:  sodium chloride Stopped (12/02/20 1900)    PRN Meds: sodium chloride, acetaminophen **OR** acetaminophen, albuterol, ipratropium, loperamide, metoprolol tartrate, ondansetron **OR** ondansetron (ZOFRAN) IV, senna-docusate, sorbitol  Physical Exam         Frail and cachectic appearing gentleman Monitor noted Has upper and lower extremity edema left hand swollen Petechiae feet and lower extremities  Noted to have bilateral hip wounds Mood and affect within normal limits, displays fairly intact judgment and insight and is able to participate in conversations  Vital Signs: BP (!) 137/92   Pulse 95   Temp 99 F (37.2 C)   Resp 15   Ht '5\' 11"'$  (1.803 m)   Wt 66 kg   SpO2 97%   BMI 20.29 kg/m  SpO2: SpO2: 97 % O2 Device: O2 Device: Room Air O2 Flow Rate: O2 Flow Rate (L/min): 2 L/min  Intake/output summary:  Intake/Output Summary (Last 24 hours) at 12/04/2020 0529 Last data filed at 12/03/2020 1800 Gross per 24 hour  Intake 317.72 ml  Output 1250 ml  Net -932.28 ml    LBM: Last BM Date: 11/30/20 Baseline Weight: Weight: 65.8 kg Most recent weight: Weight:  (bed scale not working)      Palliative performance scale 50%. Palliative Assessment/Data:      Patient Active Problem List   Diagnosis Date Noted   Bacteremia 12/01/2020   Palliative care by specialist    Goals of care, counseling/discussion    General weakness    Right leg DVT ([place]) 11/29/2020   Pancytopenia ([place]) 11/29/2020   Bilateral hydronephrosis 11/28/2020   Severe sepsis with acute organ dysfunction ([place]) [id number]   Complicated UTI (urinary tract infection) 11/27/2020   Transaminitis 11/27/2020   Severe protein-calorie malnutrition ([place]) 11/27/2020   Bilateral edema of lower extremity 11/27/2020   Hyperglycemia 11/27/2020   Dehydration 11/27/2020   Hypothermia 11/27/2020   Bladder outlet obstruction 08/17/2019   Abdominal pain 08/17/2019   Chronic indwelling Foley  catheter 06/29/2019   History of DVT (deep vein thrombosis) 06/29/2019   Obstruction of Foley catheter ([place]) 06/29/2019   Acute bilateral obstructive uropathy    Hypokalemia    Acute metabolic encephalopathy    Hyponatremia    Hematuria 06/09/2019   ARF (acute renal failure) ([place]) 06/09/2019   Acute blood loss anemia 06/09/2019   Hyperkalemia 99991111   Metabolic acidosis 99991111   Left leg DVT ([place]) 05/18/2019   Left leg swelling 05/11/2019   Pre-operative cardiovascular examination 03/22/2019   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[place]) 07/11/2018   Right arm weakness 07/21/2017   Non compliance w medication regimen 07/30/2016   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   Mild cognitive impairment 12/11/2015   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  Patient remains admitted to hospital medicine service for receiving broad-spectrum antibiotics.  He presented with severe sepsis criteria with organ dysfunction.  Was found to have hypothermia and hypotension and acute renal failure.  Also found to have acute right lower extremity DVT and metabolic acidosis.  Also noted to have bilateral hydronephrosis.  Recommendations/Plan: DNR DNI Continue current care. Discussed with patient's wife and plan to reach out to [facility] tomorrow to continue conversation regarding options for discharge planning.  Previously,  had been working to see about placement at skilled facility for trial of rehab with palliative care  to follow there, however, noted by PT and OT not to be a candidate for skilled intervention.    Goals of Care and Additional Recommendations: Limitations on Scope of Treatment:    Code Status:    Code Status Orders  (From admission, onward)           Start     Ordered   11/30/20 1453  Do not attempt resuscitation (DNR)  Continuous       Question Answer Comment  In the event of cardiac or respiratory ARREST Do not call a "code blue"   In the event of cardiac or respiratory ARREST Do not perform Intubation, CPR, defibrillation or ACLS   In the event of cardiac or respiratory ARREST Use medication by any route, position, wound care, and other measures to relive pain and suffering. May use oxygen, suction and manual treatment of airway obstruction as needed for comfort.      11/30/20 1452           Code Status History     Date Active Date Inactive Code Status Order ID Comments User Context   11/27/2020 1829 11/30/2020 1452 Full Code [id number]  Eugenie Filler, MD Inpatient   06/29/2019 1859 07/07/2019 0021 Full Code [id number]  Lenore Cordia, MD ED   06/09/2019 2105 06/15/2019 1941 Full Code [id number]  Shela Leff, MD ED      Advance Directive Documentation    Flowsheet Row Most Recent Value  Type of Advance Directive Healthcare Power of Attorney, Living will  Pre-existing out of facility DNR order (yellow form or pink MOST form) --  "MOST" Form in Place? --       Prognosis:  Unable to determine  Discharge Planning: To Be Determined  Care plan was discussed with  patient and wife.   Thank you for allowing the Palliative Medicine Team to assist in the care of this patient.   Time In: 1700 Time Out: 1730 Total Time 30 Prolonged Time Billed  no    Greater than 50%  of this time was spent counseling and coordinating care related to the above assessment and  plan.   Micheline Rough, MD  Please contact Palliative Medicine Team phone at [phone] for questions and concerns.

## 2020-12-04 NOTE — Progress Notes (Signed)
Daily Progress Note   Patient Name: Henry Carter       Date: 12/04/2020 DOB: 1940-07-28  Age: 80 y.o. MRN#: [id number] Attending Physician: Aline August, MD Primary Care Physician: [name], MD Admit Date: 11/27/2020  Reason for Consultation/Follow-up: Establishing goals of care  Subjective: I saw and examined Henry Carter.    He was lying in bed in no distress.  He reports that he is feeling as good today as he has felt in a long time and wants to go home.  Reviewed plan with [name] who is planning to discharge him home today.  I do recommend he be followed by outpatient palliative care at time of discharge.  I expressed this to Mr. [name] and he was in agreement.  Length of Stay: 7  Current Medications: Scheduled Meds:   amoxicillin  1,000 mg Oral Q8H   chlorhexidine  15 mL Mouth Rinse BID   Chlorhexidine Gluconate Cloth  6 each Topical Daily   collagenase   Topical Daily   feeding supplement  1 Container Oral TID BM   feeding supplement  237 mL Oral Q24H   ferrous sulfate  325 mg Oral Q breakfast   finasteride  5 mg Oral Daily   folic acid  1 mg Oral Daily   heparin injection (subcutaneous)  5,000 Units Subcutaneous Q8H   mouth rinse  15 mL Mouth Rinse q12n4p   multivitamin with minerals  1 tablet Oral Daily   nutrition supplement (JUVEN)  1 packet Oral BID BM   pantoprazole  40 mg Oral BID   sodium chloride flush  3 mL Intravenous Q12H   umeclidinium bromide  1 puff Inhalation Daily    Continuous Infusions:  sodium chloride Stopped (12/02/20 1900)    PRN Meds: sodium chloride, acetaminophen **OR** acetaminophen, albuterol, ipratropium, loperamide, metoprolol tartrate, ondansetron **OR** ondansetron (ZOFRAN) IV, senna-docusate, sorbitol  Physical  Exam         Frail and cachectic appearing gentleman Monitor noted Has upper and lower extremity edema left hand swollen Petechiae feet and lower extremities Noted to have bilateral hip wounds Mood and affect within normal limits, displays fairly intact judgment and insight and is able to participate in conversations  Vital Signs: BP 120/68   Pulse 90   Temp 98.1 F (36.7 C)   Resp  17   Ht '5\' 11"'$  (1.803 m)   Wt 66 kg   SpO2 97%   BMI 20.29 kg/m  SpO2: SpO2: 97 % O2 Device: O2 Device: Room Air O2 Flow Rate: O2 Flow Rate (L/min): 2 L/min  Intake/output summary:  Intake/Output Summary (Last 24 hours) at 12/04/2020 1907 Last data filed at 12/04/2020 1700 Gross per 24 hour  Intake --  Output 950 ml  Net -950 ml    LBM: Last BM Date: 11/30/20 Baseline Weight: Weight: 65.8 kg Most recent weight: Weight:  (bed scale not working)      Palliative performance scale 50%. Palliative Assessment/Data:      Patient Active Problem List   Diagnosis Date Noted   Bacteremia 12/01/2020   Palliative care by specialist    Goals of care, counseling/discussion    General weakness    Right leg DVT ([place]) 11/29/2020   Pancytopenia ([place]) 11/29/2020   Bilateral hydronephrosis 11/28/2020   Severe sepsis with acute organ dysfunction ([place]) [id number]   Complicated UTI (urinary tract infection) 11/27/2020   Transaminitis 11/27/2020   Severe protein-calorie malnutrition ([place]) 11/27/2020   Bilateral edema of lower extremity 11/27/2020   Hyperglycemia 11/27/2020   Dehydration 11/27/2020   Hypothermia 11/27/2020   Bladder outlet obstruction 08/17/2019   Abdominal pain 08/17/2019   Chronic indwelling Foley catheter 06/29/2019   History of DVT (deep vein thrombosis) 06/29/2019   Obstruction of Foley catheter ([place]) 06/29/2019   Acute bilateral obstructive uropathy    Hypokalemia    Acute metabolic encephalopathy    Hyponatremia    Hematuria 06/09/2019   ARF (acute renal failure) ([place])  06/09/2019   Acute blood loss anemia 06/09/2019   Hyperkalemia 99991111   Metabolic acidosis 99991111   Left leg DVT ([place]) 05/18/2019   Left leg swelling 05/11/2019   Pre-operative cardiovascular examination 03/22/2019   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[place]) 07/11/2018   Right arm weakness 07/21/2017   Non compliance w medication regimen 07/30/2016   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[place]) 12/11/2015   Mild cognitive impairment 12/11/2015   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  Patient remains admitted to hospital medicine service for receiving broad-spectrum antibiotics.  He presented with severe sepsis criteria with organ dysfunction.  Was found to have hypothermia and hypotension and acute renal failure.  Also found to have acute right lower extremity DVT and metabolic acidosis.  Also noted to have bilateral hydronephrosis.  Recommendations/Plan: DNR DNI Continue current care. Plan is for him to discharge home today with home health.  I recommended that he also be followed by outpatient palliative care at time of discharge.    Goals of Care and Additional Recommendations: Limitations on Scope of Treatment:    Code Status:    Code Status Orders  (From admission, onward)           Start     Ordered   11/30/20 1453  Do not attempt resuscitation (DNR)  Continuous  Question Answer Comment   In the event of cardiac or respiratory ARREST Do not call a "code blue"   In the event of cardiac or respiratory ARREST Do not perform Intubation, CPR, defibrillation or ACLS   In the event of cardiac or respiratory ARREST Use medication by any route, position, wound care, and other measures to relive pain and suffering. May use oxygen, suction and manual treatment of airway obstruction as needed for comfort.      11/30/20 1452           Code Status History     Date Active Date Inactive Code Status Order ID Comments User Context   11/27/2020 1829 11/30/2020 1452 Full Code [id number]  Eugenie Filler, MD Inpatient   06/29/2019 1859 07/07/2019 0021 Full Code [id number]  Lenore Cordia, MD ED   06/09/2019 2105 06/15/2019 1941 Full Code [id number]  Shela Leff, MD ED      Advance Directive Documentation    Flowsheet Row Most Recent Value  Type of Advance Directive Healthcare Power of Attorney, Living will  Pre-existing out of facility DNR order (yellow form or pink MOST form) --  "MOST" Form in Place? --       Prognosis:  Unable to determine  Discharge Planning: To Be Determined  Care plan was discussed with  patient and wife.   Thank you for allowing the Palliative Medicine Team to assist in the care of this patient.   Total Time 20 Prolonged Time Billed  no   Greater than 50%  of this time was spent counseling and coordinating care related to the above assessment and plan.   Micheline Rough, MD  Please contact Palliative Medicine Team phone at [phone] for questions and concerns.

## 2020-12-04 NOTE — Progress Notes (Signed)
Nutrition Follow-up  DOCUMENTATION CODES:   Severe malnutrition in context of social or environmental circumstances  INTERVENTION:  - continue Boost Breeze TID, Juven BID, and Ensure Surgery once/day.    NUTRITION DIAGNOSIS:   Severe Malnutrition related to social / environmental circumstances as evidenced by severe fat depletion, severe muscle depletion. -ongoing  GOAL:   Patient will meet greater than or equal to 90% of their needs -unmet on average  MONITOR:   PO intake, Supplement acceptance, Labs, Weight trends, Skin  ASSESSMENT:   80 year old male with medical history of iron deficiency anemia, BPH, chronic bladder outlet obstruction, COPD, HTN, GERD, and PVD. He presented to the ED via EMS after rolling out of bed and being found on the floor by his wife. In the ED he was noted to have profound hypothermia, mottled lower extremities, and to be covered in feces and bedbugs with bilateral hip wounds. He was admitted with severe sepsis, placed on Bair hugger, and started on IV abx.  Patient discussed in rounds this AM. Discharge order and discharge summary in for d/c home. TOC working on ensuring equipment is delivered prior to d/c.  Per review of orders, he has been accepting all oral nutrition supplements nearly 100% of the time offered.  Recently documented meal completion percentages: 8/20- 90% of breakfast, 30% of lunch, 60% of dinner (total of 1296 kcal and 43 grams protein) 8/21- breakfast intake not documented, skipped lunch, 70% of dinner (184 kcal and 14 grams protein) 8/22- 100% of breakfast, skipped lunch, 0% of dinner (890 kcal and 28 grams protein) 8/23- received all meals but no documentation on amount consumed 8/24- breakfast intake not documented, 90% of lunch, 75% of dinner (total of 1028 kcal and 49 grams protein)   He has not been weighed since 8/21. Weight on that date was stable since admission date of 8/18. Mild pitting edema to BLE documented in the  edema section of flow sheet.    Labs reviewed; BUN: 44 mg/dl, creatinine: 1.43 mg/dl, Ca: 8 mg/dl, Phos: 2.3 mg/dl, GFR: 50 ml/min. Medications reviewed; 325 mg ferrous sulfate/day, 1 mg folvite/day, PNR imodium, 1 tablet multivitamin with minerals/day, 40 mg oral protonix BID.   Diet Order:   Diet Order             Diet - low sodium heart healthy           DIET DYS 3 Room service appropriate? Yes; Fluid consistency: Thin  Diet effective now                   EDUCATION NEEDS:   Not appropriate for education at this time  Skin:  Skin Assessment: Skin Integrity Issues: Skin Integrity Issues:: Unstageable Unstageable: full thickness to bilateral thighs  Last BM:  8/21  Height:   Ht Readings from Last 1 Encounters:  11/27/20 '5\' 11"'$  (1.803 m)    Weight:   Wt Readings from Last 1 Encounters:  07/06/19 57.3 kg     Estimated Nutritional Needs:  Kcal:  2000-2250 kcal Protein:  105-120 grams Fluid:  >/= 2.5 L/day     Jarome Matin, MS, RD, LDN, CNSC Inpatient Clinical Dietitian RD pager # available in [facility]  After hours/weekend pager # available in [facility]

## 2020-12-04 NOTE — Discharge Summary (Signed)
Physician Discharge Summary  Henry Carter [id number] DOB: [birth year] DOA: 11/27/2020  PCP: [name], MD  Admit date: 11/27/2020 Discharge date: 12/04/2020  Admitted From: Home Disposition: Home.  Family refused SNF placement  Recommendations for Outpatient Follow-up:  Follow up with PCP in 1 week with repeat CBC/BMP Operative follow-up with palliative care. Outpatient follow-up with urology Follow up in ED if symptoms worsen or new appear   Home Health: PT/OT/RN Equipment/Devices: None  Discharge Condition: Guarded to poor  CODE STATUS: DNR Diet recommendation: As per SLP recommendations/dysphagia 3 diet  Brief/Interim Summary: 80 year old male with history of iron deficiency anemia, BPH, chronic bladder outlet obstruction, COPD, hypertension, GERD, PVD presented with failure to thrive and family not being able to take care of patient at home anymore.  On presentation, he was profoundly hypothermic with temperature of 85 with mottled lower extremities and noted to be covered in stool and bedbugs with bilateral hip wounds.  He was admitted with severe sepsis and empirically started on IV antibiotics and Bair hugger.  During the hospitalization, he was found to have Clostridium perfringens bacteremia and Aerococcus UTI.  Antibiotics were subsequently narrowed and then switched to oral amoxicillin as per ID recommendations.  He was also found to have AKI and treated with IV fluids.  Temperature gradually improved.  TOC following for SNF placement.  Palliative care also consulted for goals of care discussion.  Patient was planned for SNF placement as per PT recommendations but eventually wife changed her mind and decided to take him home.  He will be discharged home with home and PT/palliative care services.  Discharge Diagnoses:   Severe sepsis: Present on admission Clostridium perfringens bacteremia Aerococcus UTI -Patient was started on broad-spectrum antibiotics on  presentation but antibiotics subsequently narrowed down as per ID recommendations.  Currently on oral amoxicillin and ID recommended to complete 10-day course of antibiotics from 12/01/2020. -Sepsis has resolved.  PCCM has signed off   Hypothermia -Possibly from above, poor oral intake.  Treated with Retail banker.  Temperature much improved and currently stable.   Hypotension -Resolved.  Blood pressure on the higher side.  Off IV fluids   Acute renal failure Acute metabolic acidosis Dehydration Bilateral hydronephrosis/BPH/urinary retention/chronic bladder outlet obstruction -Possibly prerenal from poor oral intake, diarrhea, severe sepsis and postrenal from hydronephrosis/BPH. -CT of the abdomen and pelvis on 08/17/2019 showed chronic bladder outlet obstruction with bilateral hydronephrosis and hydroureter -Continue Foley catheter.  Urology recommended to continue Foley catheter and discharge with indwelling Foley with outpatient follow-up with urology -Creatinine has much improved: 1.43 on 12/03/2020.  Creatinine had peaked up to 2.66. -off IV fluids.   Diarrhea -Questionable cause.  Stool for GI pathogen panel PCR and C. difficile negative -Diarrhea much improved.  Rectal tube has been removed.  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  Prior hospitalist discussed with vascular surgery as well.   Transaminitis -Improving.  Hepatitis panel negative.  -Abdominal ultrasound with layering sludge within the gallbladder, complex small pericholecystic fluid collection noted of uncertain significance, limited evaluation of left hepatic lobe, kidneys, pancreas, spleen, IVC.  AAA at least 3.2 cm with significant mural thrombus and probably  stable.     Anemia of chronic disease Thrombocytopenia -Hemoglobin and platelets  stable currently.  No signs of bleeding. -GI evaluated the patient during this hospitalization and recommended no endoscopy procedures: Recommended supportive treatment, palliative care evaluation -Currently on Protonix twice a day.  Switch to once a day on discharge.   Severe protein calorie malnutrition/failure to thrive Generalized deconditioning -Follow nutrition recommendations.  Tolerating diet as per SLP recommendations. -Overall prognosis is guarded to poor.  Palliative care following intermittently.  Patient is currently DNR.  Will need outpatient palliative care follow-up if condition were to worsen -Patient has apparently been bedbound for several months with minimal oral intake -Patient was planned for SNF placement as per PT recommendations but eventually wife changed her mind and decided to take him home.  He will be discharged home with home and PT/palliative care services. -If condition were to worsen, he will probably be appropriate for home hospice.   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 -Prior hospitalist discussed with vascular surgery, Dr. Donzetta Matters who reviewed films and felt no further intervention or recommendations needed at this time.  Discharge Instructions  Discharge Instructions     Amb Referral to Palliative Care   Complete by: As directed    Diet - low sodium heart healthy   Complete by: As directed    Discharge wound care:   Complete by: As directed    Cover right hip wound with hydrocollid dressing Kellie Simmering # [phone]) to promote autolytic debridement, change every 3days and PRN drainage leakage  Cleanse left hip wounds with saline, pat dry.  Apply 1/4" thick layer of Santyl to the wounds with black/yellow slough, top with saline moist 2x2 gauze and  dry dressing. Change daily   Increase activity slowly   Complete by: As directed       Allergies as of 12/04/2020       Reactions   Ace Inhibitors    REACTION: angioedema        Medication List     STOP taking these medications    ciprofloxacin 250 MG tablet Commonly known as: CIPRO   simvastatin 20 MG tablet Commonly known as: ZOCOR   traMADol 50 MG tablet Commonly known as: ULTRAM       TAKE these medications    albuterol 108 (90 Base) MCG/ACT inhaler Commonly known as: VENTOLIN HFA Inhale 2 puffs into the lungs every 6 (six) hours as needed for wheezing or shortness of breath.   amoxicillin 500 MG capsule Commonly known as: AMOXIL Take 2 capsules (1,000 mg total) by mouth every 8 (eight) hours for 7 days.   collagenase ointment Commonly known as: SANTYL Apply topically daily. For 10 days Apply to left hip wounds with black tissue present daily   ferrous sulfate 325 (65 FE) MG tablet Take 325 mg by mouth daily with breakfast.   finasteride 5 MG tablet Commonly known as: PROSCAR Take 5 mg by mouth daily.   folic acid 1 MG tablet Commonly known as: FOLVITE Take 1 tablet (1 mg total) by mouth daily.   loperamide 2 MG capsule Commonly known as: IMODIUM Take 1 capsule (2 mg total) by mouth as needed for diarrhea or loose stools.   multivitamin with minerals Tabs tablet Take 1 tablet by mouth daily.   pantoprazole 40 MG tablet Commonly known as: PROTONIX Take 1 tablet (40 mg total) by mouth daily.  Discharge Care Instructions  (From admission, onward)           Start     Ordered   12/04/20 0000  Discharge wound care:       Comments: Cover right hip wound with hydrocollid dressing Kellie Simmering # [phone]) to promote autolytic debridement, change every 3days and PRN drainage leakage  Cleanse left hip wounds with saline, pat dry.  Apply 1/4" thick layer of Santyl to the wounds with black/yellow slough, top with saline moist 2x2  gauze and dry dressing. Change daily   12/04/20 1019            Follow-up Information     [name], MD. Schedule an appointment as soon as possible for a visit in 1 week(s).   Specialty: Internal Medicine Why: With repeat CBC/BMP Contact information: [place] [address] [phone]         Alexis Frock, MD. Schedule an appointment as soon as possible for a visit in 1 week(s).   Specialty: Urology Contact information: [address] [place] [address] [phone]                Allergies  Allergen Reactions   Ace Inhibitors     REACTION: angioedema    Consultations: PCCM/urology/GI/palliative care/ID/curb sided vascular surgery by prior hospitalist   Procedures/Studies: US Abdomen Complete  Result Date: 11/30/2020 CLINICAL DATA:  Transaminitis. EXAM: ABDOMEN ULTRASOUND COMPLETE COMPARISON:  CT of the abdomen and pelvis on 08/17/2019 FINDINGS: Gallbladder: Gallbladder wall is UPPER normal, 3 millimeters. There is no sonographic Murphy's sign. In the gallbladder lumen, there is layering sludge without stones. Complex pericholecystic fluid collection noted. Common bile duct: Diameter: 5.0 millimeters Liver: Visualized portions of the liver are unremarkable. LEFT hepatic lobe is difficult to visualized secondary to overlying bowel gas. Portal vein is patent on color Doppler imaging with normal direction of blood flow towards the liver. IVC: Not well seen Pancreas: Partially imaged.  No abnormality identified. Spleen: Not well seen. Right Kidney: Length: Not well seen. Left Kidney: Length: Not well seen. Abdominal aorta: Abdominal aortic aneurysm measures at least 3.2 centimeters. Mural thrombus again noted. Other findings: Study quality is degraded by immobility, difficulty holding breath, and overlying bowel gas. IMPRESSION: 1. Layering sludge within the gallbladder. 2. Complex small pericholecystic fluid collection noted, of  uncertain significance. 3. Limited evaluation of the LEFT hepatic lobe, kidneys, pancreas, spleen, IVC. 4. 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 Electronically Signed   By: Nolon Nations M.D.   On: 11/30/2020 17:53   US RENAL  Result Date: 11/27/2020 CLINICAL DATA:  Acute renal failure. EXAM: RENAL / URINARY TRACT ULTRASOUND COMPLETE COMPARISON:  June 29, 2019 FINDINGS: Right Kidney: Renal measurements: 9.5 cm x 4.0 cm x 4.4 cm = volume: 87.0 mL. Diffusely increased echogenicity of the renal parenchyma is seen. 1.6 cm x 1.5 cm x 1.5 cm and 1.0 cm x 1.0 cm x 0.8 cm anechoic structures are seen within the right kidney. No abnormal flow is noted within these areas on color Doppler evaluation. There is moderate severity right-sided hydronephrosis. Left Kidney: Renal measurements: 9.1 cm x 4.3 cm x 3.6 cm = volume: 73.7 mL. Diffusely increased echogenicity of the renal parenchyma is seen. 2.5 cm x 2.0 cm x 2.0 cm and 3.4 cm x 2.2 cm x 2.3 cm anechoic structures are seen within the left kidney. No abnormal flow is noted within these areas on color  Doppler evaluation. A 1.1 cm shadowing echogenic focus is seen within the left kidney. There is moderate severity left-sided hydronephrosis. Bladder: A Foley catheter is present within the urinary bladder. Other: The study is limited secondary to the patient's body habitus and inability to take deep inspiration. IMPRESSION: 1. Bilateral echogenic kidneys likely secondary to medical renal disease. 2. Moderate severity bilateral hydronephrosis. 3. 1.1 cm nonobstructing left renal calculus. 4. Bilateral simple renal cysts. Electronically Signed   By: Virgina Norfolk M.D.   On: 11/27/2020 20:04   DG Pelvis Portable  Result Date: 11/27/2020 CLINICAL DATA:  [place] EXAM: PORTABLE PELVIS 1-2 VIEWS COMPARISON:  CT 08/17/2019 FINDINGS: There is no evidence of pelvic fracture or diastasis. No pelvic bone lesions  are seen. Extensive aortoiliac atheromatous calcifications. Retrievable IVC filter. Spondylitic changes in the visualized lower lumbar spine. IMPRESSION: No acute findings. Electronically Signed   By: Lucrezia Europe M.D.   On: 11/27/2020 15:22   DG Chest Port 1 View  Result Date: 11/27/2020 CLINICAL DATA:  [place] EXAM: PORTABLE CHEST 1 VIEW COMPARISON:  Chest radiograph 06/09/2019 FINDINGS: The cardiomediastinal silhouette is within normal limits. The lungs are clear, with no focal consolidation or pulmonary edema. There is no pleural effusion or pneumothorax. There is no acute osseous abnormality. IMPRESSION: No radiographic evidence of acute cardiopulmonary process. Electronically Signed   By: Valetta Mole M.D.   On: 11/27/2020 15:21   VAS [place] LOWER EXTREMITY VENOUS (DVT)  Result Date: 11/28/2020  Lower Venous DVT Study Patient Name:  Henry Carter  Date of Exam:   11/28/2020 Medical Rec #: [id number]         Accession #:    [id number] Date of Birth: 1941/04/10         Patient Gender: M Patient Age:   36 years Exam Location:  [facility] Procedure:      VAS [place] LOWER EXTREMITY VENOUS (DVT) Referring Phys: Irine Seal --------------------------------------------------------------------------------  Indications: Edema.  Risk Factors: Immobility DVT LLE - 2021 infrarenal IVC filter present. Limitations: Body habitus and calcific shadowing, poor patient positioning. Comparison Study: Previous exam to LLE 05/11/19 Positive DVT (extensive) Performing Technologist: Rogelia Rohrer RVT, RDMS  Examination Guidelines: A complete evaluation includes B-mode imaging, spectral Doppler, color Doppler, and power Doppler as needed of all accessible portions of each vessel. Bilateral testing is considered an integral part of a complete examination. Limited examinations for reoccurring indications may be performed as noted. The reflux portion of the exam is performed with the patient in reverse Trendelenburg.   +---------+---------------+---------+-----------+----------+-----------------+ RIGHT    CompressibilityPhasicitySpontaneityPropertiesThrombus Aging    +---------+---------------+---------+-----------+----------+-----------------+ CFV      None           No       No                   Age Indeterminate +---------+---------------+---------+-----------+----------+-----------------+ SFJ      None                                         Age Indeterminate +---------+---------------+---------+-----------+----------+-----------------+ FV Prox  None           No       No                   Acute             +---------+---------------+---------+-----------+----------+-----------------+ FV Mid   Partial  No       No                   Acute             +---------+---------------+---------+-----------+----------+-----------------+ FV DistalNone           No       No                   Acute             +---------+---------------+---------+-----------+----------+-----------------+ PFV      None           No       No                   Acute             +---------+---------------+---------+-----------+----------+-----------------+ POP      None           No       No                   Age Indeterminate +---------+---------------+---------+-----------+----------+-----------------+ PTV      None           No       No                   Acute             +---------+---------------+---------+-----------+----------+-----------------+ PERO     None           No       No                   Acute             +---------+---------------+---------+-----------+----------+-----------------+ GSV      None           No       No                   Acute             +---------+---------------+---------+-----------+----------+-----------------+ SSV      None           No       No                   Age Indeterminate  +---------+---------------+---------+-----------+----------+-----------------+   +---------+---------------+---------+-----------+----------+--------------+ LEFT     CompressibilityPhasicitySpontaneityPropertiesThrombus Aging +---------+---------------+---------+-----------+----------+--------------+ CFV      Full           Yes      Yes                                 +---------+---------------+---------+-----------+----------+--------------+ SFJ      Full                                                        +---------+---------------+---------+-----------+----------+--------------+ FV Prox  Full           Yes      Yes                                 +---------+---------------+---------+-----------+----------+--------------+ FV Mid  Full           Yes      Yes                                 +---------+---------------+---------+-----------+----------+--------------+ FV DistalPartial        Yes      Yes                  Chronic        +---------+---------------+---------+-----------+----------+--------------+ PFV      Full                                                        +---------+---------------+---------+-----------+----------+--------------+ POP      Full           Yes      Yes                                 +---------+---------------+---------+-----------+----------+--------------+ PTV      Full                                                        +---------+---------------+---------+-----------+----------+--------------+ PERO     Full                                                        +---------+---------------+---------+-----------+----------+--------------+     Summary: BILATERAL: -No evidence of popliteal cyst, bilaterally. RIGHT: - Findings consistent with acute deep vein thrombosis involving the right proximal profunda vein, right femoral vein, right posterior tibial veins, and right peroneal veins. - Findings consistent  with acute superficial vein thrombosis involving the right great saphenous vein. - Findings consistent with age indeterminate deep vein thrombosis involving the right common femoral vein, SF junction, and right popliteal vein. - Findings consistent with age indeterminate superficial vein thrombosis involving the right small saphenous vein.  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 - There is no evidence of superficial venous thrombosis.  *See table(s) above for measurements and observations. Electronically signed by Deitra Mayo MD on 11/28/2020 at 2:33:51 PM.    Final    VAS US AORTA/IVC/ILIACS  Result Date: 11/28/2020 IVC/ILIAC STUDY Patient Name:  Henry Carter  Date of Exam:   11/28/2020 Medical Rec #: [id number]         Accession #:    [id number] Date of Birth: 1940/12/24         Patient Gender: M Patient Age:   72 years Exam Location:  [facility] Procedure:      VAS US AORTA/IVC/ILIACS Referring Phys: Irine Seal --------------------------------------------------------------------------------  Indications: DVT RLE extending proximal to CFV Other Factors: Partially thrombosed infrarenal AAA noted on previous imaging -                this is not a new finding. Limitations: [place],  calcific shadowing.  Comparison Study: CT ABD/PELVIS 08/17/2019 - positive for thrombus in IVC, right                   CIV, and right EIV Performing Technologist: Jody Hill RVT, RDMS  Examination Guidelines: A complete evaluation includes B-mode imaging, spectral Doppler, color Doppler, and power Doppler as needed of all accessible portions of each vessel. Bilateral testing is considered an integral part of a complete examination. Limited examinations for reoccurring indications may be performed as noted.  IVC/Iliac Findings: +----------+------+--------+--------+    IVC    PatentThrombusComments +----------+------+--------+--------+ IVC Prox   patent                 +----------+------+--------+--------+ IVC Mid         chronic          +----------+------+--------+--------+ IVC Distal      chronic          +----------+------+--------+--------+  +-------------------+---------+-----------+---------+-----------+--------------+         CIV        RT-PatentRT-ThrombusLT-PatentLT-Thrombus   Comments    +-------------------+---------+-----------+---------+-----------+--------------+ Common Iliac Prox             chronic   patent                            +-------------------+---------+-----------+---------+-----------+--------------+ Common Iliac Mid              chronic   patent                            +-------------------+---------+-----------+---------+-----------+--------------+ Common Iliac Distal                                        Not visualized +-------------------+---------+-----------+---------+-----------+--------------+ Unable to compress LT CIV due to overlying AAA +-------------------------+---------+-----------+---------+-----------+--------+            EIV           RT-PatentRT-ThrombusLT-PatentLT-ThrombusComments +-------------------------+---------+-----------+---------+-----------+--------+ External Iliac Vein Prox            chronic   patent                      +-------------------------+---------+-----------+---------+-----------+--------+ External Iliac Vein Mid             chronic   patent                      +-------------------------+---------+-----------+---------+-----------+--------+ External Iliac Vein                 chronic   patent                      Distal                                                                    +-------------------------+---------+-----------+---------+-----------+--------+   Summary: IVC/Iliac: 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 There is chronic thrombus in the right common and external iliac  veins. The left common and external iliac veins are patent.  *See table(s) above for  measurements and observations.  Electronically signed by Deitra Mayo MD on 11/28/2020 at 2:37:45 PM.    Final       Subjective: Patient seen and examined at bedside.  Poor historian.  No vomiting, seizures or fever reported.  Discharge Exam: Vitals:   12/04/20 0800 12/04/20 0837  BP: 128/88   Pulse: 85   Resp:    Temp: 98.6 F (37 C)   SpO2: 99% 98%    General exam: No acute distress.  Looks chronically ill and deconditioned.  Cachectic.  On room air currently.  Patient is a poor historian Respiratory system: Decreased breath sounds at bases bilaterally with some scattered crackles Cardiovascular system: Rate controlled, S1-S2 heard  gastrointestinal system: Abdomen is distended slightly, soft and nontender.  Bowel sounds are heard Extremities: Bilateral lower extremity edema present; no clubbing Central nervous system: Extremely slow to respond.  Extremely poor historian.  No focal neurological deficits.  Moves extremities  skin: Petechiae on feet and lower extremities present.  No other obvious lesions Psychiatry: Cannot assess because of mental status.  Barely participates in any conversation.      The results of significant diagnostics from this hospitalization (including imaging, microbiology, ancillary and laboratory) are listed below for reference.     Microbiology: Recent Results (from the past 240 hour(s))  Blood Culture (routine x 2)     Status: None   Collection Time: 11/27/20  1:28 PM   Specimen: BLOOD  Result Value Ref Range Status   Specimen Description   Final    BLOOD ARM Performed at [facility] [address], [place], [place] [address]    Special Requests   Final    BOTTLES DRAWN AEROBIC AND ANAEROBIC Blood Culture results may not be optimal due to an inadequate volume of blood received in culture bottles Performed at [facility] [address], [place], [place] [address]    Culture   Final    NO GROWTH 5 DAYS Performed at [facility], [place] [address], [place], [place] [address]    Report Status 12/02/2020 FINAL  Final  Blood Culture (routine x 2)     Status: Abnormal   Collection Time: 11/27/20  1:44 PM   Specimen: BLOOD  Result Value Ref Range Status   Specimen Description   Final    BLOOD ARM LEFT Performed at [facility] [address], [place], [place] [address]    Special Requests   Final    BOTTLES DRAWN AEROBIC AND ANAEROBIC Blood Culture results may not be optimal due to an inadequate volume of blood received in culture bottles Performed at [facility] [address], [place], [place] [address]    Culture  Setup Time (A)  Final    GRAM VARIABLE ROD ANAEROBIC BOTTLE ONLY CRITICAL RESULT CALLED TO, READ BACK BY AND VERIFIED WITHGuadlupe Spanish PHARMD [id number] 11/28/20 A BROWNING Performed at [facility], [place] [address], [place], [place] [address]    Culture (A)  Final    DIPHTHEROIDS(CORYNEBACTERIUM SPECIES) Standardized susceptibility testing for this organism is not available. CLOSTRIDIUM PERFRINGENS    Report Status 12/01/2020 FINAL  Final  Blood Culture ID Panel (Reflexed)     Status: None   Collection Time: 11/27/20  1:44 PM  Result Value Ref Range Status   Enterococcus faecalis NOT DETECTED NOT DETECTED Final   Enterococcus Faecium NOT DETECTED NOT DETECTED Final   Listeria monocytogenes NOT DETECTED NOT DETECTED  Final   Staphylococcus species NOT DETECTED NOT DETECTED Final   Staphylococcus aureus (BCID) NOT DETECTED NOT DETECTED Final   Staphylococcus epidermidis NOT DETECTED NOT DETECTED Final   Staphylococcus lugdunensis NOT DETECTED NOT DETECTED Final   Streptococcus species NOT DETECTED NOT DETECTED Final   Streptococcus agalactiae NOT DETECTED NOT DETECTED Final   Streptococcus pneumoniae NOT DETECTED NOT DETECTED Final    Streptococcus pyogenes NOT DETECTED NOT DETECTED Final   A.calcoaceticus-baumannii NOT DETECTED NOT DETECTED Final   Bacteroides fragilis NOT DETECTED NOT DETECTED Final   Enterobacterales NOT DETECTED NOT DETECTED Final   Enterobacter cloacae complex NOT DETECTED NOT DETECTED Final   Escherichia coli NOT DETECTED NOT DETECTED Final   Klebsiella aerogenes NOT DETECTED NOT DETECTED Final   Klebsiella oxytoca NOT DETECTED NOT DETECTED Final   Klebsiella pneumoniae NOT DETECTED NOT DETECTED Final   Proteus species NOT DETECTED NOT DETECTED Final   Salmonella species NOT DETECTED NOT DETECTED Final   Serratia marcescens NOT DETECTED NOT DETECTED Final   Haemophilus influenzae NOT DETECTED NOT DETECTED Final   Neisseria meningitidis NOT DETECTED NOT DETECTED Final   Pseudomonas aeruginosa NOT DETECTED NOT DETECTED Final   Stenotrophomonas maltophilia NOT DETECTED NOT DETECTED Final   Candida albicans NOT DETECTED NOT DETECTED Final   Candida auris NOT DETECTED NOT DETECTED Final   Candida glabrata NOT DETECTED NOT DETECTED Final   Candida krusei NOT DETECTED NOT DETECTED Final   Candida parapsilosis NOT DETECTED NOT DETECTED Final   Candida tropicalis NOT DETECTED NOT DETECTED Final   Cryptococcus neoformans/gattii NOT DETECTED NOT DETECTED Final    Comment: Performed at [facility], [address], [place], [place] [address]  Resp Panel by RT-PCR (Flu A&B, Covid) Nasopharyngeal Swab     Status: None   Collection Time: 11/27/20  1:46 PM   Specimen: Nasopharyngeal Swab; Nasopharyngeal(NP) swabs in vial transport medium  Result Value Ref Range Status   SARS Coronavirus 2 by RT PCR NEGATIVE NEGATIVE Final    Comment: (NOTE) SARS-CoV-2 target nucleic acids are NOT DETECTED.  The SARS-CoV-2 RNA is generally detectable in upper respiratory specimens during the acute phase of infection. The lowest concentration of SARS-CoV-2 viral copies this assay can detect is 138 copies/mL. A  negative result does not preclude SARS-Cov-2 infection and should not be used as the sole basis for treatment or other patient management decisions. A negative result may occur with  improper specimen collection/handling, submission of specimen other than nasopharyngeal swab, presence of viral mutation(s) within the areas targeted by this assay, and inadequate number of viral copies(<138 copies/mL). A negative result must be combined with clinical observations, patient history, and epidemiological information. The expected result is Negative.  Fact Sheet for Patients:  EntrepreneurPulse.com.au  Fact Sheet for Healthcare Providers:  IncredibleEmployment.be  This test is no t yet approved or cleared by the [place] FDA and  has been authorized for detection and/or diagnosis of SARS-CoV-2 by FDA under an Emergency Use Authorization (EUA). This EUA will remain  in effect (meaning this test can be used) for the duration of the COVID-19 declaration under Section 564(b)(1) of the Act, 21 U.S.C.section 360bbb-3(b)(1), unless the authorization is terminated  or revoked sooner.       Influenza A by PCR NEGATIVE NEGATIVE Final   Influenza B by PCR NEGATIVE NEGATIVE Final    Comment: (NOTE) The Xpert Xpress SARS-CoV-2/FLU/RSV plus assay is intended as an aid in the diagnosis of influenza from Nasopharyngeal swab specimens and should not  be used as a sole basis for treatment. Nasal washings and aspirates are unacceptable for Xpert Xpress SARS-CoV-2/FLU/RSV testing.  Fact Sheet for Patients: EntrepreneurPulse.com.au  Fact Sheet for Healthcare Providers: IncredibleEmployment.be  This test is not yet approved or cleared by the [place] FDA and has been authorized for detection and/or diagnosis of SARS-CoV-2 by FDA under an Emergency Use Authorization (EUA). This EUA will remain in effect (meaning this test can  be used) for the duration of the COVID-19 declaration under Section 564(b)(1) of the Act, 21 U.S.C. section 360bbb-3(b)(1), unless the authorization is terminated or revoked.  Performed at [facility], [place] [address], [place], [place] [address]   Urine Culture     Status: Abnormal   Collection Time: 11/27/20  1:48 PM   Specimen: Urine, Catheterized  Result Value Ref Range Status   Specimen Description   Final    URINE, CATHETERIZED Performed at [facility] [address] [place], [place] [address]    Special Requests   Final    NONE Performed at [facility], [place] [address], [place], [place] [address]    Culture (A)  Final    >=100,000 COLONIES/mL AEROCOCCUS SPECIES Standardized susceptibility testing for this organism is not available. Performed at [facility], [place] [address], [place], [place] [address]    Report Status 11/29/2020 FINAL  Final  Gastrointestinal Panel by PCR , Stool     Status: None   Collection Time: 11/27/20  7:31 PM   Specimen: Stool  Result Value Ref Range Status   Campylobacter species NOT DETECTED NOT DETECTED Final   Plesimonas shigelloides NOT DETECTED NOT DETECTED Final   Salmonella species NOT DETECTED NOT DETECTED Final   Yersinia enterocolitica NOT DETECTED NOT DETECTED Final   Vibrio species NOT DETECTED NOT DETECTED Final   Vibrio cholerae NOT DETECTED NOT DETECTED Final   Enteroaggregative E coli (EAEC) NOT DETECTED NOT DETECTED Final   Enteropathogenic E coli (EPEC) NOT DETECTED NOT DETECTED Final   Enterotoxigenic E coli (ETEC) NOT DETECTED NOT DETECTED Final   Shiga like toxin producing E coli (STEC) NOT DETECTED NOT DETECTED Final   Shigella/Enteroinvasive E coli (EIEC) NOT DETECTED NOT DETECTED Final   Cryptosporidium NOT DETECTED NOT DETECTED Final   Cyclospora cayetanensis NOT DETECTED NOT DETECTED Final   Entamoeba histolytica NOT DETECTED NOT DETECTED Final    Giardia lamblia NOT DETECTED NOT DETECTED Final   Adenovirus F40/41 NOT DETECTED NOT DETECTED Final   Astrovirus NOT DETECTED NOT DETECTED Final   Norovirus GI/GII NOT DETECTED NOT DETECTED Final   Rotavirus A NOT DETECTED NOT DETECTED Final   Sapovirus (I, II, IV, and V) NOT DETECTED NOT DETECTED Final    Comment: Performed at [facility], [place] [place], [place] [address]  C Difficile Quick Screen w PCR reflex     Status: None   Collection Time: 11/27/20  7:32 PM   Specimen: STOOL  Result Value Ref Range Status   C Diff antigen NEGATIVE NEGATIVE Final   C Diff toxin NEGATIVE NEGATIVE Final   C Diff interpretation No C. difficile detected.  Final    Comment: Performed at [facility], [place] [address] [place], [place] [address]  MRSA Next Gen by PCR, Nasal     Status: None   Collection Time: 11/29/20  5:25 AM   Specimen: Nasal Mucosa; Nasal Swab  Result Value Ref Range Status   MRSA by PCR Next Gen NOT  DETECTED NOT DETECTED Final    Comment: (NOTE) The GeneXpert MRSA Assay (FDA approved for NASAL specimens only), is one component of a comprehensive MRSA colonization surveillance program. It is not intended to diagnose MRSA infection nor to guide or monitor treatment for MRSA infections. Test performance is not FDA approved in patients less than 72 years old. Performed at [facility], [place] [address], [place], [place] [address]      Labs: BNP (last 3 results) No results for input(s): BNP in the last 8760 hours. Basic Metabolic Panel: Recent Labs  Lab 11/28/20 0254 11/29/20 0905 11/30/20 0303 12/01/20 0302 12/02/20 0252 12/03/20 0733  NA 137 135 137 136 138 139  K 4.2 3.3* 2.8* 3.2* 3.8 3.8  CL 111 101 99 104 110 109  CO2 16* 21* '28 23 23 24  '$ GLUCOSE 81 61* 70 146* 93 146*  BUN 80* 77* 72* 59* 49* 44*  CREATININE 2.55* 2.26* 2.20* 1.91* 1.57* 1.43*  CALCIUM 8.0* 7.6* 7.7* 7.6* 7.9* 8.0*  MG 2.2 2.0 1.7 2.5* 2.2  1.9  PHOS 5.7* 4.1 3.4  --  1.9* 2.3*   Liver Function Tests: Recent Labs  Lab 11/28/20 0254 11/29/20 0905 11/30/20 0303 12/01/20 0302 12/02/20 0252 12/03/20 0733  AST 99* 150* 140* 103*  --  64*  ALT 34 45* 45* 41  --  35  ALKPHOS 75 73 72 63  --  67  BILITOT 0.8 0.8 0.5 0.6  --  0.8  PROT 5.4* 5.2* 5.0* 4.8*  --  4.7*  ALBUMIN 3.0* 2.7* 2.6* 2.3* 2.3* 2.2*   No results for input(s): LIPASE, AMYLASE in the last 168 hours. No results for input(s): AMMONIA in the last 168 hours. CBC: Recent Labs  Lab 11/27/20 1328 11/28/20 0254 11/28/20 1430 11/29/20 0905 11/30/20 0303 12/01/20 0302 12/02/20 0252 12/03/20 0500  WBC 5.2 2.7*  --  5.2 5.8 4.5 5.3 4.9  NEUTROABS 5.0 2.5  --  4.6  --   --  4.6 4.0  HGB 8.6* 6.6*   < > 10.3* 9.9* 9.8* 10.0* 9.5*  HCT 28.2* 20.5*   < > 30.2* 29.7* 30.6* 31.5* 30.3*  MCV 80.3 76.2*  --  75.1* 76.5* 79.3* 80.6 82.6  PLT 161 124*  --  114* 101* 107* 118* 131*   < > = values in this interval not displayed.   Cardiac Enzymes: No results for input(s): CKTOTAL, CKMB, CKMBINDEX, TROPONINI in the last 168 hours. BNP: Invalid input(s): POCBNP CBG: Recent Labs  Lab 12/02/20 1759 12/02/20 2350 12/03/20 1150 12/03/20 1633 12/03/20 2349  GLUCAP 125* 79 116* 158* 117*   D-Dimer No results for input(s): DDIMER in the last 72 hours. Hgb A1c No results for input(s): HGBA1C in the last 72 hours. Lipid Profile No results for input(s): CHOL, HDL, LDLCALC, TRIG, CHOLHDL, LDLDIRECT in the last 72 hours. Thyroid function studies No results for input(s): TSH, T4TOTAL, T3FREE, THYROIDAB in the last 72 hours.  Invalid input(s): FREET3 Anemia work up No results for input(s): VITAMINB12, FOLATE, FERRITIN, TIBC, IRON, RETICCTPCT in the last 72 hours. Urinalysis    Component Value Date/Time   COLORURINE YELLOW 11/27/2020 1348   APPEARANCEUR TURBID (A) 11/27/2020 1348   LABSPEC 1.011 11/27/2020 1348   PHURINE 6.0 11/27/2020 1348   GLUCOSEU NEGATIVE  11/27/2020 1348   HGBUR MODERATE (A) 11/27/2020 1348   BILIRUBINUR NEGATIVE 11/27/2020 1348   BILIRUBINUR negative 08/01/2017 1649   KETONESUR 5 (A) 11/27/2020 1348   PROTEINUR 100 (A) 11/27/2020  1348   UROBILINOGEN negative (A) 08/01/2017 1649   NITRITE NEGATIVE 11/27/2020 1348   LEUKOCYTESUR MODERATE (A) 11/27/2020 1348   Sepsis Labs Invalid input(s): PROCALCITONIN,  WBC,  LACTICIDVEN Microbiology Recent Results (from the past 240 hour(s))  Blood Culture (routine x 2)     Status: None   Collection Time: 11/27/20  1:28 PM   Specimen: BLOOD  Result Value Ref Range Status   Specimen Description   Final    BLOOD ARM Performed at [facility] [address], [place], [place] [address]    Special Requests   Final    BOTTLES DRAWN AEROBIC AND ANAEROBIC Blood Culture results may not be optimal due to an inadequate volume of blood received in culture bottles Performed at [facility] [address] [place], [place] [address]    Culture   Final    NO GROWTH 5 DAYS Performed at [facility], [place] [address], [place], [place] [address]    Report Status 12/02/2020 FINAL  Final  Blood Culture (routine x 2)     Status: Abnormal   Collection Time: 11/27/20  1:44 PM   Specimen: BLOOD  Result Value Ref Range Status   Specimen Description   Final    BLOOD ARM LEFT Performed at [facility] [address], [place], [place] [address]    Special Requests   Final    BOTTLES DRAWN AEROBIC AND ANAEROBIC Blood Culture results may not be optimal due to an inadequate volume of blood received in culture bottles Performed at [facility] [address], [place], [place] [address]    Culture  Setup Time (A)  Final    GRAM VARIABLE ROD ANAEROBIC BOTTLE ONLY CRITICAL RESULT CALLED TO, READ BACK BY AND VERIFIED WITHGuadlupe Spanish PHARMD [id number] 11/28/20 A BROWNING Performed at [facility], [place] [address]  [place], [place] [address]    Culture (A)  Final    DIPHTHEROIDS(CORYNEBACTERIUM SPECIES) Standardized susceptibility testing for this organism is not available. CLOSTRIDIUM PERFRINGENS    Report Status 12/01/2020 FINAL  Final  Blood Culture ID Panel (Reflexed)     Status: None   Collection Time: 11/27/20  1:44 PM  Result Value Ref Range Status   Enterococcus faecalis NOT DETECTED NOT DETECTED Final   Enterococcus Faecium NOT DETECTED NOT DETECTED Final   Listeria monocytogenes NOT DETECTED NOT DETECTED Final   Staphylococcus species NOT DETECTED NOT DETECTED Final   Staphylococcus aureus (BCID) NOT DETECTED NOT DETECTED Final   Staphylococcus epidermidis NOT DETECTED NOT DETECTED Final   Staphylococcus lugdunensis NOT DETECTED NOT DETECTED Final   Streptococcus species NOT DETECTED NOT DETECTED Final   Streptococcus agalactiae NOT DETECTED NOT DETECTED Final   Streptococcus pneumoniae NOT DETECTED NOT DETECTED Final   Streptococcus pyogenes NOT DETECTED NOT DETECTED Final   A.calcoaceticus-baumannii NOT DETECTED NOT DETECTED Final   Bacteroides fragilis NOT DETECTED NOT DETECTED Final   Enterobacterales NOT DETECTED NOT DETECTED Final   Enterobacter cloacae complex NOT DETECTED NOT DETECTED Final   Escherichia coli NOT DETECTED NOT DETECTED Final   Klebsiella aerogenes NOT DETECTED NOT DETECTED Final   Klebsiella oxytoca NOT DETECTED NOT DETECTED Final   Klebsiella pneumoniae NOT DETECTED NOT DETECTED Final   Proteus species NOT DETECTED NOT DETECTED Final   Salmonella species NOT DETECTED NOT DETECTED Final   Serratia marcescens NOT DETECTED NOT DETECTED Final   Haemophilus influenzae NOT DETECTED NOT DETECTED Final   Neisseria meningitidis  NOT DETECTED NOT DETECTED Final   Pseudomonas aeruginosa NOT DETECTED NOT DETECTED Final   Stenotrophomonas maltophilia NOT DETECTED NOT DETECTED Final   Candida albicans NOT DETECTED NOT DETECTED Final   Candida auris NOT DETECTED NOT DETECTED  Final   Candida glabrata NOT DETECTED NOT DETECTED Final   Candida krusei NOT DETECTED NOT DETECTED Final   Candida parapsilosis NOT DETECTED NOT DETECTED Final   Candida tropicalis NOT DETECTED NOT DETECTED Final   Cryptococcus neoformans/gattii NOT DETECTED NOT DETECTED Final    Comment: Performed at [facility], [place] [address], [place], [place] [address]  Resp Panel by RT-PCR (Flu A&B, Covid) Nasopharyngeal Swab     Status: None   Collection Time: 11/27/20  1:46 PM   Specimen: Nasopharyngeal Swab; Nasopharyngeal(NP) swabs in vial transport medium  Result Value Ref Range Status   SARS Coronavirus 2 by RT PCR NEGATIVE NEGATIVE Final    Comment: (NOTE) SARS-CoV-2 target nucleic acids are NOT DETECTED.  The SARS-CoV-2 RNA is generally detectable in upper respiratory specimens during the acute phase of infection. The lowest concentration of SARS-CoV-2 viral copies this assay can detect is 138 copies/mL. A negative result does not preclude SARS-Cov-2 infection and should not be used as the sole basis for treatment or other patient management decisions. A negative result may occur with  improper specimen collection/handling, submission of specimen other than nasopharyngeal swab, presence of viral mutation(s) within the areas targeted by this assay, and inadequate number of viral copies(<138 copies/mL). A negative result must be combined with clinical observations, patient history, and epidemiological information. The expected result is Negative.  Fact Sheet for Patients:  EntrepreneurPulse.com.au  Fact Sheet for Healthcare Providers:  IncredibleEmployment.be  This test is no t yet approved or cleared by the [place] FDA and  has been authorized for detection and/or diagnosis of SARS-CoV-2 by FDA under an Emergency Use Authorization (EUA). This EUA will remain  in effect (meaning this test can be used) for the duration of the COVID-19  declaration under Section 564(b)(1) of the Act, 21 U.S.C.section 360bbb-3(b)(1), unless the authorization is terminated  or revoked sooner.       Influenza A by PCR NEGATIVE NEGATIVE Final   Influenza B by PCR NEGATIVE NEGATIVE Final    Comment: (NOTE) The Xpert Xpress SARS-CoV-2/FLU/RSV plus assay is intended as an aid in the diagnosis of influenza from Nasopharyngeal swab specimens and should not be used as a sole basis for treatment. Nasal washings and aspirates are unacceptable for Xpert Xpress SARS-CoV-2/FLU/RSV testing.  Fact Sheet for Patients: EntrepreneurPulse.com.au  Fact Sheet for Healthcare Providers: IncredibleEmployment.be  This test is not yet approved or cleared by the [place] FDA and has been authorized for detection and/or diagnosis of SARS-CoV-2 by FDA under an Emergency Use Authorization (EUA). This EUA will remain in effect (meaning this test can be used) for the duration of the COVID-19 declaration under Section 564(b)(1) of the Act, 21 U.S.C. section 360bbb-3(b)(1), unless the authorization is terminated or revoked.  Performed at [facility], [place] [address], [place], [place] [address]   Urine Culture     Status: Abnormal   Collection Time: 11/27/20  1:48 PM   Specimen: Urine, Catheterized  Result Value Ref Range Status   Specimen Description   Final    URINE, CATHETERIZED Performed at [facility] [address] [place], [place] [address]    Special Requests   Final    NONE Performed at [facility], [place] [address] [place], [place] [address]    Culture (A)  Final    >=100,000 COLONIES/mL AEROCOCCUS SPECIES Standardized susceptibility testing for this organism is not available. Performed at [facility], [place] [address], [place], [place] [address]    Report Status 11/29/2020 FINAL  Final  Gastrointestinal Panel by PCR , Stool     Status:  None   Collection Time: 11/27/20  7:31 PM   Specimen: Stool  Result Value Ref Range Status   Campylobacter species NOT DETECTED NOT DETECTED Final   Plesimonas shigelloides NOT DETECTED NOT DETECTED Final   Salmonella species NOT DETECTED NOT DETECTED Final   Yersinia enterocolitica NOT DETECTED NOT DETECTED Final   Vibrio species NOT DETECTED NOT DETECTED Final   Vibrio cholerae NOT DETECTED NOT DETECTED Final   Enteroaggregative E coli (EAEC) NOT DETECTED NOT DETECTED Final   Enteropathogenic E coli (EPEC) NOT DETECTED NOT DETECTED Final   Enterotoxigenic E coli (ETEC) NOT DETECTED NOT DETECTED Final   Shiga like toxin producing E coli (STEC) NOT DETECTED NOT DETECTED Final   Shigella/Enteroinvasive E coli (EIEC) NOT DETECTED NOT DETECTED Final   Cryptosporidium NOT DETECTED NOT DETECTED Final   Cyclospora cayetanensis NOT DETECTED NOT DETECTED Final   Entamoeba histolytica NOT DETECTED NOT DETECTED Final   Giardia lamblia NOT DETECTED NOT DETECTED Final   Adenovirus F40/41 NOT DETECTED NOT DETECTED Final   Astrovirus NOT DETECTED NOT DETECTED Final   Norovirus GI/GII NOT DETECTED NOT DETECTED Final   Rotavirus A NOT DETECTED NOT DETECTED Final   Sapovirus (I, II, IV, and V) NOT DETECTED NOT DETECTED Final    Comment: Performed at [facility], [place] [place], [place] [address]  C Difficile Quick Screen w PCR reflex     Status: None   Collection Time: 11/27/20  7:32 PM   Specimen: STOOL  Result Value Ref Range Status   C Diff antigen NEGATIVE NEGATIVE Final   C Diff toxin NEGATIVE NEGATIVE Final   C Diff interpretation No C. difficile detected.  Final    Comment: Performed at [facility], [place] [address] [place], [place] [address]  MRSA Next Gen by PCR, Nasal     Status: None   Collection Time: 11/29/20  5:25 AM   Specimen: Nasal Mucosa; Nasal Swab  Result Value Ref Range Status   MRSA by PCR Next Gen NOT DETECTED NOT DETECTED Final     Comment: (NOTE) The GeneXpert MRSA Assay (FDA approved for NASAL specimens only), is one component of a comprehensive MRSA colonization surveillance program. It is not intended to diagnose MRSA infection nor to guide or monitor treatment for MRSA infections. Test performance is not FDA approved in patients less than 65 years old. Performed at [facility], [place] [address], [place], [place] [address]      Time coordinating discharge: 35 minutes  SIGNED:   Aline August, MD  [facility] 12/04/2020, 10:21 AM

## 2020-12-04 NOTE — TOC Progression Note (Addendum)
Transition of Care [facility]) - Progression Note    Patient Details  Name: Henry Carter MRN: [id number] Date of Birth: Sep 03, 1940  [facility] CM/SW Contact  Leeroy Cha, RN Phone Number: 12/04/2020, 8:19 AM  Clinical Narrative:    per pALLIATIVE NOTE NOT A CANIDIATE FOR SNF, HAS BEEN BED BOUND FOR MONTHS.  WILL FOLLOWING FOR HHC OR HOSPICE AT HOME NEEDS PER WIFE WISHES. Patient had hhc approx 7-8 months ago does not know agency, [facility] for the palliative care will need hospital bed can not get house ready until tomorrow.  Will notify md and agencies. Hhc will be done through [facility] through [facility]. [facility] called for 1800 pick up per [facility] equipment should be delivered around 3-4pm. Floor rn made aware and travel packet at the desk. Expected Discharge Plan: Home/Self Care Barriers to Discharge: Continued Medical Work up  Expected Discharge Plan and Services Expected Discharge Plan: Home/Self Care   Discharge Planning Services: CM Consult   Living arrangements for the past 2 months: Single Family Home                                       Social Determinants of Health (SDOH) Interventions    Readmission Risk Interventions No flowsheet data found.

## 2020-12-04 NOTE — Progress Notes (Signed)
[facility] ([facility])  Hospital Liaison RN note         Notified by TOC manager of patient/family request for [facility] Palliative services at home after discharge.              [facility] Palliative team will follow up with patient after discharge.         Please call with any hospice or palliative related questions.         Thank you for the opportunity to participate in this patient's care.     Chrislyn King, BSN, RN [facility] Hospital Liaison (listed on [facility] under [facility]ospice/[facility])    [phone] (24h on call)    

## 2020-12-04 NOTE — Consult Note (Signed)
[facility] Inpatient Consult   12/04/2020  Henry Carter 1941/03/15 [id number]  [place] [facility] [[facility]] Patient: [name]   Patient was screened for [facility] Management services due to high unplanned readmission risk score. This patient is also has primary provider practice which has a chronic disease management embedded care management team. Per review, patient is not currently active with CCM team.   Attempted to speak with patient via telephone; no answer. Per chart review, family has requested patient go home with home health services vs. SNF placement recommendation. Referral to embedded CCM team to be placed for post hospital care management services.   Of note, [facility] Care Management services does not replace or interfere with any services that are arranged by inpatient case management or social work.   Henry Cedars, MSN, [facility] Liaison Nurse Mobile Phone [phone]  Toll free office [phone]

## 2020-12-04 NOTE — Progress Notes (Signed)
[facility] arrived to transport patient home.  Patient's wife was called to make her aware patient is being discharged and on his way home.  Two bags of belongings for the patient were left in the bathroom.  The wife stated it was okay for those items to be thrown away.

## 2020-12-05 ENCOUNTER — Telehealth: Payer: Self-pay | Admitting: Internal Medicine

## 2020-12-05 ENCOUNTER — Telehealth: Payer: Self-pay

## 2020-12-05 NOTE — Telephone Encounter (Signed)
Transition Care Management Follow-up Telephone Call Date of discharge and from where: 12/04/2020 from [facility] How have you been since you were released from the hospital? Issues with appetite Any questions or concerns? No  Items Reviewed: Did the pt receive and understand the discharge instructions provided? Yes  Medications obtained and verified? Yes  Other? No  Any new allergies since your discharge? No  Dietary orders reviewed? Yes, low sodium heart healthy Do you have support at home? Yes , wife  Home Care and Equipment/Supplies: Were home health services ordered? yes If so, what is the name of the agency? Not listed  Has the agency set up a time to come to the patient's home? no Were any new equipment or medical supplies ordered?  No What is the name of the medical supply agency? N/a Were you able to get the supplies/equipment? not applicable Do you have any questions related to the use of the equipment or supplies? No  Functional Questionnaire: (I = Independent and D = Dependent) ADLs: D  Bathing/Dressing- D  Meal Prep- D  Eating- I  Maintaining continence- I  Transferring/Ambulation- I  Managing Meds- D  Follow up appointments reviewed:  PCP Hospital f/u appt confirmed? Yes  Scheduled to see Pricilla Holm, MD on 12/11/2020 @ 10:20 am. [facility] f/u appt confirmed? No  follow up with Pallative Care Are transportation arrangements needed? No  If their condition worsens, is the pt aware to call PCP or go to the Emergency Dept.? Yes Was the patient provided with contact information for the PCP's office or ED? Yes Was to pt encouraged to call back with questions or concerns? Yes

## 2020-12-05 NOTE — Telephone Encounter (Signed)
Ok to start palliative care. Please advise

## 2020-12-05 NOTE — Telephone Encounter (Signed)
Patient recently discharged from [facility]  Recommendation for Palliative Care  Please call [phone] opt. 2

## 2020-12-05 NOTE — Telephone Encounter (Signed)
Transition Care Management Unsuccessful Follow-up Telephone Call  Date of discharge and from where:  12/04/2020 from [facility]  Attempts:  1st Attempt  Reason for unsuccessful TCM follow-up call:  Left voice message

## 2020-12-08 NOTE — Telephone Encounter (Signed)
Noted  

## 2020-12-08 NOTE — Telephone Encounter (Signed)
We have not seen, cannot give any verbals

## 2020-12-10 ENCOUNTER — Other Ambulatory Visit: Payer: Self-pay

## 2020-12-11 ENCOUNTER — Inpatient Hospital Stay: Payer: Medicare HMO | Admitting: Internal Medicine

## 2020-12-19 ENCOUNTER — Telehealth: Payer: Self-pay

## 2020-12-19 ENCOUNTER — Telehealth: Payer: Self-pay | Admitting: Internal Medicine

## 2020-12-19 ENCOUNTER — Ambulatory Visit: Payer: Medicare HMO | Admitting: Internal Medicine

## 2020-12-19 NOTE — Telephone Encounter (Signed)
   Flor from [facility] calling to report they are unable to accept referral They did an assessment and determined the home in unsafe. Flor reports dog feces, trash and other unsanitary conditions. The patient is bed bound but the spouse is leaving him alone for periods of time. Patient unkept and needs proper hygiene and skin addressed  Orland Mustard states APS will be contacted as well  Phone [phone]

## 2020-12-19 NOTE — Telephone Encounter (Signed)
Left voicemail for Mrs. Pio to call the office back to reschedule the patients appt.

## 2020-12-19 NOTE — Telephone Encounter (Signed)
Please advise as Langley Gauss with Palliative care has called asking that a nurse gives her a call on today after the pt is seen with [facility] to start palliative care if provider sees fit.  Please call Langley Gauss at [phone] opt. 2

## 2020-12-19 NOTE — Telephone Encounter (Addendum)
Patient's wife called in mains prior to appt to cancel because she could not get the patient in the car.   Wife asked to speak to the provider for [facility] others, she was informed that the appt can be switched to virtually however wife states they are unable to do a virtual visit.   Henry Carter was notified the about will be cancelled and someone from our office will contact them to schedule an appt.

## 2020-12-22 ENCOUNTER — Encounter: Payer: Self-pay | Admitting: Internal Medicine

## 2020-12-22 ENCOUNTER — Ambulatory Visit (INDEPENDENT_AMBULATORY_CARE_PROVIDER_SITE_OTHER): Payer: Medicare HMO | Admitting: Internal Medicine

## 2020-12-22 ENCOUNTER — Other Ambulatory Visit: Payer: Self-pay

## 2020-12-22 VITALS — BP 116/74 | HR 70 | Ht 71.0 in

## 2020-12-22 DIAGNOSIS — N320 Bladder-neck obstruction: Secondary | ICD-10-CM

## 2020-12-22 DIAGNOSIS — E43 Unspecified severe protein-calorie malnutrition: Secondary | ICD-10-CM | POA: Diagnosis not present

## 2020-12-22 DIAGNOSIS — D500 Iron deficiency anemia secondary to blood loss (chronic): Secondary | ICD-10-CM

## 2020-12-22 DIAGNOSIS — Z978 Presence of other specified devices: Secondary | ICD-10-CM

## 2020-12-22 DIAGNOSIS — N179 Acute kidney failure, unspecified: Secondary | ICD-10-CM

## 2020-12-22 LAB — COMPREHENSIVE METABOLIC PANEL
ALT: 15 U/L (ref 0–53)
AST: 20 U/L (ref 0–37)
Albumin: 2.6 g/dL — ABNORMAL LOW (ref 3.5–5.2)
Alkaline Phosphatase: 102 U/L (ref 39–117)
BUN: 27 mg/dL — ABNORMAL HIGH (ref 6–23)
CO2: 22 mEq/L (ref 19–32)
Calcium: 8.6 mg/dL (ref 8.4–10.5)
Chloride: 109 mEq/L (ref 96–112)
Creatinine, Ser: 1.14 mg/dL (ref 0.40–1.50)
GFR: 61.18 mL/min (ref >60.00)
Glucose, Bld: 74 mg/dL (ref 70–99)
Potassium: 5.2 mEq/L — ABNORMAL HIGH (ref 3.5–5.1)
Sodium: 140 mEq/L (ref 135–145)
Total Bilirubin: 0.3 mg/dL (ref 0.2–1.2)
Total Protein: 5.7 g/dL — ABNORMAL LOW (ref 6.0–8.3)

## 2020-12-22 LAB — CBC
HCT: 32.6 % — ABNORMAL LOW (ref 39.0–52.0)
Hemoglobin: 10.5 g/dL — ABNORMAL LOW (ref 13.0–17.0)
MCHC: 32.2 g/dL (ref 30.0–36.0)
MCV: 81.9 fl (ref 78.0–100.0)
Platelets: 457 10*3/uL — ABNORMAL HIGH (ref 150.0–400.0)
RBC: 3.99 Mil/uL — ABNORMAL LOW (ref 4.22–5.81)
RDW: 22 % — ABNORMAL HIGH (ref 11.5–15.5)
WBC: 8.3 10*3/uL (ref 4.0–10.5)

## 2020-12-22 NOTE — Assessment & Plan Note (Signed)
Referral to urology

## 2020-12-22 NOTE — Assessment & Plan Note (Signed)
Needs CMP today to ensure stability of renal function and adequate hydration.

## 2020-12-22 NOTE — Assessment & Plan Note (Signed)
Referral to urology. Wife is poor historian and could not recall how long he has had catheter. I recalled him having this in 2021 but she states that he has had it only since August this time. They have not followed up with urology since that time. It is difficult for them to get him around.

## 2020-12-22 NOTE — Telephone Encounter (Signed)
Fyi.

## 2020-12-22 NOTE — Progress Notes (Signed)
   Subjective:   Patient ID: Henry Carter, male    DOB: 1940/07/01, 80 y.o.   MRN: [id number]  HPI The patient is a 80 YO man coming in with wife for hospital follow up. Was admitted with ARF and anemia back in August. Had transfusion and discharged with catheter. He was recommended SNF but wife and patient decided to go home. They were ordered PT and we got call from PT stating that due to home environment they could not provide services and would not be returning. He is not doing well since getting home. Stays in bed all the time and wife cannot lift him by herself. He is still having sores on his feet and buttock. She states he is not eating well then changes to that he is eating and drinking okay. They are not sure about pain but it does hurt him to move and hurts to move his neck. Neck stays hunched over all the time.   Review of Systems  Unable to perform ROS: Patient unresponsive  Musculoskeletal:  Positive for arthralgias and neck pain.  Skin:  Positive for wound.   Objective:  Physical Exam Constitutional:      Appearance: He is well-developed. He is ill-appearing.     Comments: Wasting diffuse on the muscles including temporal wasting, smelling of feces  HENT:     Head: Normocephalic and atraumatic.  Cardiovascular:     Rate and Rhythm: Normal rate and regular rhythm.  Pulmonary:     Effort: Pulmonary effort is normal. No respiratory distress.     Breath sounds: Normal breath sounds. No wheezing or rales.  Abdominal:     General: Bowel sounds are normal. There is no distension.     Palpations: Abdomen is soft.     Tenderness: There is no abdominal tenderness. There is no rebound.  Genitourinary:    Comments: Catheter in place and freely draining Musculoskeletal:        General: Deformity present.     Cervical back: Normal range of motion.     Right lower leg: Edema present.     Left lower leg: Edema present.  Skin:    General: Skin is warm and dry.     Comments:  Wound on the left heel which is covered and down to level of soft tissue, wound on buttock not examined  Neurological:     Mental Status: He is alert.     Coordination: Coordination abnormal.     Comments: He is barely responsive to family or provider during visit, answers yes/no question once during visit. Unable to move or lift head during visit    Vitals:   12/22/20 1515  BP: 116/74  Pulse: 70  Height: '5\' 11"'$  (1.803 m)    This visit occurred during the SARS-CoV-2 public health emergency.  Safety protocols were in place, including screening questions prior to the visit, additional usage of staff PPE, and extensive cleaning of exam room while observing appropriate contact time as indicated for disinfecting solutions.   Assessment & Plan:

## 2020-12-22 NOTE — Patient Instructions (Addendum)
We will check the labs today to see how the kidneys and liver and blood counts are doing.  We will have the hospice contact you to see about getting some help in the home.  The home health company center well did not feel that the environment was safe to provide care in. If hospice is not able to help you may be able to get a private company to come out.

## 2020-12-22 NOTE — Assessment & Plan Note (Signed)
Checking CBC today. He is having some oozing from his wounds on feet and buttock per wife.

## 2020-12-22 NOTE — Telephone Encounter (Signed)
Noted agree with APS evaluation as hospital had concerns about unsafe environment as well but did not complete referral.

## 2020-12-22 NOTE — Assessment & Plan Note (Signed)
Appears to have significant temporal wasting. Does have some swelling in the lower extremities likely due to low albumin (2.2 in the hospital).

## 2020-12-23 NOTE — Telephone Encounter (Signed)
Referral was placed for Hospice during the office visit on 12/22/2020.

## 2020-12-24 ENCOUNTER — Telehealth: Payer: Self-pay | Admitting: *Deleted

## 2020-12-24 ENCOUNTER — Telehealth: Payer: Medicare HMO

## 2020-12-24 NOTE — Chronic Care Management (AMB) (Signed)
  Chronic Care Carter   Note  12/24/2020 Name: Henry Carter MRN: [id number] DOB: 1941-02-05  Henry Carter is a 80 y.o. year old male who is a primary care patient of [name], MD. I reached out to Henry Carter by phone today in response to a referral sent by Henry Carter PCP, Dr. [name].       Henry Carter was given information about Chronic Care Carter services today including:  CCM service includes personalized support from designated clinical staff supervised by his physician, including individualized plan of care and coordination with other care providers 24/7 contact phone numbers for assistance for urgent and routine care needs. Service will only be billed when office clinical staff spend 20 minutes or more in a month to coordinate care. Only one practitioner may furnish and bill the service in a calendar month. The patient may stop CCM services at any time (effective at the end of the month) by phone call to the office staff. The patient will be responsible for cost sharing (co-pay) of up to 20% of the service fee (after annual deductible is met).  Patient agreed to services and verbal consent obtained.   Follow up plan: Telephone appointment with care Carter team member scheduled ZCK:06/02/77 with Licensed Clinical SW and 12/31/20 with Carter, Henry Carter  Direct Dial: [phone]

## 2020-12-28 DIAGNOSIS — N1330 Unspecified hydronephrosis: Secondary | ICD-10-CM | POA: Diagnosis not present

## 2020-12-28 DIAGNOSIS — L89616 Pressure-induced deep tissue damage of right heel: Secondary | ICD-10-CM | POA: Diagnosis not present

## 2020-12-28 DIAGNOSIS — L89150 Pressure ulcer of sacral region, unstageable: Secondary | ICD-10-CM | POA: Diagnosis not present

## 2020-12-28 DIAGNOSIS — E860 Dehydration: Secondary | ICD-10-CM | POA: Diagnosis not present

## 2020-12-28 DIAGNOSIS — L89109 Pressure ulcer of unspecified part of back, unspecified stage: Secondary | ICD-10-CM | POA: Diagnosis not present

## 2020-12-28 DIAGNOSIS — J90 Pleural effusion, not elsewhere classified: Secondary | ICD-10-CM | POA: Diagnosis not present

## 2020-12-28 DIAGNOSIS — R0902 Hypoxemia: Secondary | ICD-10-CM | POA: Diagnosis not present

## 2020-12-28 DIAGNOSIS — E11622 Type 2 diabetes mellitus with other skin ulcer: Secondary | ICD-10-CM | POA: Diagnosis not present

## 2020-12-28 DIAGNOSIS — R519 Headache, unspecified: Secondary | ICD-10-CM | POA: Diagnosis not present

## 2020-12-28 DIAGNOSIS — K5900 Constipation, unspecified: Secondary | ICD-10-CM | POA: Diagnosis not present

## 2020-12-28 DIAGNOSIS — R680 Hypothermia, not associated with low environmental temperature: Secondary | ICD-10-CM | POA: Diagnosis not present

## 2020-12-28 DIAGNOSIS — Z1619 Resistance to other specified beta lactam antibiotics: Secondary | ICD-10-CM | POA: Diagnosis not present

## 2020-12-28 DIAGNOSIS — L89219 Pressure ulcer of right hip, unspecified stage: Secondary | ICD-10-CM | POA: Diagnosis not present

## 2020-12-28 DIAGNOSIS — R531 Weakness: Secondary | ICD-10-CM | POA: Diagnosis not present

## 2020-12-28 DIAGNOSIS — J449 Chronic obstructive pulmonary disease, unspecified: Secondary | ICD-10-CM | POA: Diagnosis not present

## 2020-12-28 DIAGNOSIS — T83518A Infection and inflammatory reaction due to other urinary catheter, initial encounter: Secondary | ICD-10-CM | POA: Diagnosis not present

## 2020-12-28 DIAGNOSIS — L89893 Pressure ulcer of other site, stage 3: Secondary | ICD-10-CM | POA: Diagnosis not present

## 2020-12-28 DIAGNOSIS — E43 Unspecified severe protein-calorie malnutrition: Secondary | ICD-10-CM | POA: Diagnosis not present

## 2020-12-28 DIAGNOSIS — R7881 Bacteremia: Secondary | ICD-10-CM | POA: Diagnosis not present

## 2020-12-28 DIAGNOSIS — N179 Acute kidney failure, unspecified: Secondary | ICD-10-CM | POA: Diagnosis not present

## 2020-12-28 DIAGNOSIS — N12 Tubulo-interstitial nephritis, not specified as acute or chronic: Secondary | ICD-10-CM | POA: Diagnosis not present

## 2020-12-28 DIAGNOSIS — Z743 Need for continuous supervision: Secondary | ICD-10-CM | POA: Diagnosis not present

## 2020-12-28 DIAGNOSIS — Z20822 Contact with and (suspected) exposure to covid-19: Secondary | ICD-10-CM | POA: Diagnosis not present

## 2020-12-28 DIAGNOSIS — R627 Adult failure to thrive: Secondary | ICD-10-CM | POA: Diagnosis not present

## 2020-12-28 DIAGNOSIS — G3184 Mild cognitive impairment, so stated: Secondary | ICD-10-CM | POA: Diagnosis not present

## 2020-12-28 DIAGNOSIS — T68XXXA Hypothermia, initial encounter: Secondary | ICD-10-CM | POA: Diagnosis not present

## 2020-12-28 DIAGNOSIS — I714 Abdominal aortic aneurysm, without rupture: Secondary | ICD-10-CM | POA: Diagnosis not present

## 2020-12-28 DIAGNOSIS — E161 Other hypoglycemia: Secondary | ICD-10-CM | POA: Diagnosis not present

## 2020-12-28 DIAGNOSIS — L89159 Pressure ulcer of sacral region, unspecified stage: Secondary | ICD-10-CM | POA: Diagnosis not present

## 2020-12-28 DIAGNOSIS — R3129 Other microscopic hematuria: Secondary | ICD-10-CM | POA: Diagnosis not present

## 2020-12-28 DIAGNOSIS — L89629 Pressure ulcer of left heel, unspecified stage: Secondary | ICD-10-CM | POA: Diagnosis not present

## 2020-12-28 DIAGNOSIS — N390 Urinary tract infection, site not specified: Secondary | ICD-10-CM | POA: Diagnosis not present

## 2020-12-28 DIAGNOSIS — E162 Hypoglycemia, unspecified: Secondary | ICD-10-CM | POA: Diagnosis not present

## 2020-12-28 DIAGNOSIS — N320 Bladder-neck obstruction: Secondary | ICD-10-CM | POA: Diagnosis not present

## 2020-12-28 DIAGNOSIS — R6520 Severe sepsis without septic shock: Secondary | ICD-10-CM | POA: Diagnosis not present

## 2020-12-28 DIAGNOSIS — E785 Hyperlipidemia, unspecified: Secondary | ICD-10-CM | POA: Diagnosis not present

## 2020-12-28 DIAGNOSIS — I739 Peripheral vascular disease, unspecified: Secondary | ICD-10-CM | POA: Diagnosis not present

## 2020-12-28 DIAGNOSIS — R001 Bradycardia, unspecified: Secondary | ICD-10-CM | POA: Diagnosis not present

## 2020-12-28 DIAGNOSIS — N139 Obstructive and reflux uropathy, unspecified: Secondary | ICD-10-CM | POA: Diagnosis not present

## 2020-12-28 DIAGNOSIS — E039 Hypothyroidism, unspecified: Secondary | ICD-10-CM | POA: Diagnosis not present

## 2020-12-28 DIAGNOSIS — A419 Sepsis, unspecified organism: Secondary | ICD-10-CM | POA: Diagnosis not present

## 2020-12-28 DIAGNOSIS — Z87828 Personal history of other (healed) physical injury and trauma: Secondary | ICD-10-CM | POA: Diagnosis not present

## 2020-12-28 DIAGNOSIS — L89620 Pressure ulcer of left heel, unstageable: Secondary | ICD-10-CM | POA: Diagnosis not present

## 2020-12-28 DIAGNOSIS — L89210 Pressure ulcer of right hip, unstageable: Secondary | ICD-10-CM | POA: Diagnosis not present

## 2020-12-28 DIAGNOSIS — N138 Other obstructive and reflux uropathy: Secondary | ICD-10-CM | POA: Diagnosis not present

## 2020-12-28 DIAGNOSIS — R404 Transient alteration of awareness: Secondary | ICD-10-CM | POA: Diagnosis not present

## 2020-12-28 DIAGNOSIS — N400 Enlarged prostate without lower urinary tract symptoms: Secondary | ICD-10-CM | POA: Diagnosis not present

## 2020-12-28 DIAGNOSIS — R5381 Other malaise: Secondary | ICD-10-CM | POA: Diagnosis not present

## 2020-12-28 DIAGNOSIS — B9689 Other specified bacterial agents as the cause of diseases classified elsewhere: Secondary | ICD-10-CM | POA: Diagnosis not present

## 2020-12-28 DIAGNOSIS — R42 Dizziness and giddiness: Secondary | ICD-10-CM | POA: Diagnosis not present

## 2020-12-28 DIAGNOSIS — Z6820 Body mass index (BMI) 20.0-20.9, adult: Secondary | ICD-10-CM | POA: Diagnosis not present

## 2020-12-28 DIAGNOSIS — L89222 Pressure ulcer of left hip, stage 2: Secondary | ICD-10-CM | POA: Diagnosis not present

## 2020-12-28 DIAGNOSIS — Z681 Body mass index (BMI) 19 or less, adult: Secondary | ICD-10-CM | POA: Diagnosis not present

## 2020-12-28 DIAGNOSIS — R4182 Altered mental status, unspecified: Secondary | ICD-10-CM | POA: Diagnosis not present

## 2020-12-28 DIAGNOSIS — L8990 Pressure ulcer of unspecified site, unspecified stage: Secondary | ICD-10-CM | POA: Diagnosis not present

## 2020-12-28 DIAGNOSIS — I10 Essential (primary) hypertension: Secondary | ICD-10-CM | POA: Diagnosis not present

## 2020-12-29 DIAGNOSIS — R627 Adult failure to thrive: Secondary | ICD-10-CM | POA: Diagnosis not present

## 2020-12-29 DIAGNOSIS — L8990 Pressure ulcer of unspecified site, unspecified stage: Secondary | ICD-10-CM | POA: Diagnosis not present

## 2020-12-29 DIAGNOSIS — N390 Urinary tract infection, site not specified: Secondary | ICD-10-CM | POA: Diagnosis not present

## 2020-12-30 ENCOUNTER — Ambulatory Visit (INDEPENDENT_AMBULATORY_CARE_PROVIDER_SITE_OTHER): Payer: Medicare HMO | Admitting: *Deleted

## 2020-12-30 DIAGNOSIS — N179 Acute kidney failure, unspecified: Secondary | ICD-10-CM

## 2020-12-30 NOTE — Chronic Care Management (AMB) (Cosign Needed)
  Chronic Care Management   Note  12/30/2020 Name: Henry Carter MRN: [id number] DOB: 29-Jul-1940   CSW received referral and made initial outreach call today. CSW spoke with pt's wife who reports "my husband is dying". She also shared that pt is under Hospice care. CSW offered emotional support and ended call. CSW will sign off due to Hospice involvement. CSW will alert PCP and CCM team to above.   Marland Kitchenjc

## 2020-12-31 ENCOUNTER — Ambulatory Visit: Payer: Medicare HMO | Admitting: *Deleted

## 2020-12-31 DIAGNOSIS — E785 Hyperlipidemia, unspecified: Secondary | ICD-10-CM

## 2020-12-31 DIAGNOSIS — I10 Essential (primary) hypertension: Secondary | ICD-10-CM

## 2020-12-31 NOTE — Chronic Care Management (AMB) (Cosign Needed)
Chronic Care Management   CCM RN Visit Note  12/31/2020 Name: Henry Carter MRN: [id number] DOB: 1940-05-30  Subjective: Henry Carter is a 80 y.o. year old male who is a primary care patient of [name], MD. The care management team was consulted for assistance with disease management and care coordination needs.    Collaboration with CCM LCSW care coordination   for  case closure  in response to provider referral for case management and/or care coordination services.   Received update from LCSW on 12/30/20 that patient is now under Hospice care at home; LCSW signed off; RN CM sign off/ with case closure today  Consent to Services:  The patient was given information about Chronic Care Management services, agreed to services, and gave verbal consent prior to initiation of services.  Please see initial visit note for detailed documentation.  Patient agreed to services and verbal consent obtained.   Assessment: Review of patient past medical history, allergies, medications, health status, including review of consultants reports, laboratory and other test data, was performed as part of comprehensive evaluation and provision of chronic care management services.   CCM Care Plan Allergies  Allergen Reactions   Ace Inhibitors     REACTION: angioedema    Outpatient Encounter Medications as of 12/31/2020  Medication Sig   albuterol (VENTOLIN HFA) 108 (90 Base) MCG/ACT inhaler Inhale 2 puffs into the lungs every 6 (six) hours as needed for wheezing or shortness of breath.   collagenase (SANTYL) ointment Apply topically daily. For 10 days Apply to left hip wounds with black tissue present daily   ferrous sulfate 325 (65 FE) MG tablet Take 325 mg by mouth daily with breakfast.   finasteride (PROSCAR) 5 MG tablet Take 5 mg by mouth daily.   folic acid (FOLVITE) 1 MG tablet Take 1 tablet (1 mg total) by mouth daily.   loperamide (IMODIUM) 2 MG capsule Take 1 capsule (2 mg total)  by mouth as needed for diarrhea or loose stools.   Multiple Vitamin (MULTIVITAMIN WITH MINERALS) TABS tablet Take 1 tablet by mouth daily.   pantoprazole (PROTONIX) 40 MG tablet Take 1 tablet (40 mg total) by mouth daily.   No facility-administered encounter medications on file as of 12/31/2020.    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[place]) 11/29/2020   Pancytopenia ([place]) 11/29/2020   Bilateral hydronephrosis 11/28/2020   Severe sepsis with acute organ dysfunction ([place]) 11/27/2020   Transaminitis 11/27/2020   Severe protein-calorie malnutrition ([place]) 11/27/2020   Bilateral edema of lower extremity 11/27/2020   Hyperglycemia 11/27/2020   Dehydration 11/27/2020   Hypothermia 11/27/2020   Bladder outlet obstruction 08/17/2019   Abdominal pain 08/17/2019   Chronic indwelling Foley catheter 06/29/2019   History of DVT (deep vein thrombosis) 06/29/2019   Acute bilateral obstructive uropathy    Hypokalemia    Acute metabolic encephalopathy    Hyponatremia    Hematuria 06/09/2019   ARF (acute renal failure) ([place]) 06/09/2019   Acute blood loss anemia 06/09/2019   Hyperkalemia 78/29/5621   Metabolic acidosis 30/86/5784   Left leg DVT ([place]) 05/18/2019   Left leg swelling 05/11/2019   Pre-operative cardiovascular examination 03/22/2019   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[place]) 07/11/2018   Right arm weakness 07/21/2017   Non compliance w medication regimen 07/30/2016   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   Mild cognitive  impairment 12/11/2015   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   Healthcare maintenance 02/07/2013   Iron deficiency anemia due to chronic blood loss 01/18/2013   Diarrhea 09/22/2012   C O P D 04/08/2010   Atherosclerosis of native artery of extremity with intermittent claudication ([place]) 11/26/2008   Benign prostatic hyperplasia  10/20/2007   PERIPHERAL VASCULAR DISEASE 05/19/2007   Essential hypertension 02/01/2007   Hyperlipidemia 01/03/2007   Conditions to be addressed/monitored: End of Life/ under hospice care: case closure accordingly  Plan:  No further follow up required  Oneta Rack, RN, BSN, [place] [phone]: direct office [phone]: mobile

## 2021-01-08 DIAGNOSIS — Z86718 Personal history of other venous thrombosis and embolism: Secondary | ICD-10-CM | POA: Diagnosis not present

## 2021-01-08 DIAGNOSIS — R6521 Severe sepsis with septic shock: Secondary | ICD-10-CM | POA: Diagnosis not present

## 2021-01-08 DIAGNOSIS — T68XXXA Hypothermia, initial encounter: Secondary | ICD-10-CM | POA: Diagnosis not present

## 2021-01-08 DIAGNOSIS — Z833 Family history of diabetes mellitus: Secondary | ICD-10-CM | POA: Diagnosis not present

## 2021-01-08 DIAGNOSIS — Z743 Need for continuous supervision: Secondary | ICD-10-CM | POA: Diagnosis not present

## 2021-01-08 DIAGNOSIS — J9811 Atelectasis: Secondary | ICD-10-CM | POA: Diagnosis present

## 2021-01-08 DIAGNOSIS — N10 Acute tubulo-interstitial nephritis: Secondary | ICD-10-CM | POA: Diagnosis not present

## 2021-01-08 DIAGNOSIS — Z87891 Personal history of nicotine dependence: Secondary | ICD-10-CM | POA: Diagnosis not present

## 2021-01-08 DIAGNOSIS — R531 Weakness: Secondary | ICD-10-CM | POA: Diagnosis not present

## 2021-01-08 DIAGNOSIS — E162 Hypoglycemia, unspecified: Secondary | ICD-10-CM | POA: Diagnosis not present

## 2021-01-08 DIAGNOSIS — E860 Dehydration: Secondary | ICD-10-CM | POA: Diagnosis present

## 2021-01-08 DIAGNOSIS — R410 Disorientation, unspecified: Secondary | ICD-10-CM | POA: Diagnosis not present

## 2021-01-08 DIAGNOSIS — I7143 Infrarenal abdominal aortic aneurysm, without rupture: Secondary | ICD-10-CM | POA: Diagnosis not present

## 2021-01-08 DIAGNOSIS — I739 Peripheral vascular disease, unspecified: Secondary | ICD-10-CM | POA: Diagnosis not present

## 2021-01-08 DIAGNOSIS — Y738 Miscellaneous gastroenterology and urology devices associated with adverse incidents, not elsewhere classified: Secondary | ICD-10-CM | POA: Diagnosis present

## 2021-01-08 DIAGNOSIS — Z20822 Contact with and (suspected) exposure to covid-19: Secondary | ICD-10-CM | POA: Diagnosis not present

## 2021-01-08 DIAGNOSIS — Z978 Presence of other specified devices: Secondary | ICD-10-CM | POA: Diagnosis not present

## 2021-01-08 DIAGNOSIS — B9689 Other specified bacterial agents as the cause of diseases classified elsewhere: Secondary | ICD-10-CM | POA: Diagnosis not present

## 2021-01-08 DIAGNOSIS — R5381 Other malaise: Secondary | ICD-10-CM | POA: Diagnosis not present

## 2021-01-08 DIAGNOSIS — L89893 Pressure ulcer of other site, stage 3: Secondary | ICD-10-CM | POA: Diagnosis not present

## 2021-01-08 DIAGNOSIS — L8990 Pressure ulcer of unspecified site, unspecified stage: Secondary | ICD-10-CM | POA: Diagnosis not present

## 2021-01-08 DIAGNOSIS — K5900 Constipation, unspecified: Secondary | ICD-10-CM | POA: Diagnosis not present

## 2021-01-08 DIAGNOSIS — R4020 Unspecified coma: Secondary | ICD-10-CM | POA: Diagnosis not present

## 2021-01-08 DIAGNOSIS — L89210 Pressure ulcer of right hip, unstageable: Secondary | ICD-10-CM | POA: Diagnosis present

## 2021-01-08 DIAGNOSIS — N320 Bladder-neck obstruction: Secondary | ICD-10-CM | POA: Diagnosis not present

## 2021-01-08 DIAGNOSIS — N3289 Other specified disorders of bladder: Secondary | ICD-10-CM | POA: Diagnosis not present

## 2021-01-08 DIAGNOSIS — E161 Other hypoglycemia: Secondary | ICD-10-CM | POA: Diagnosis not present

## 2021-01-08 DIAGNOSIS — K219 Gastro-esophageal reflux disease without esophagitis: Secondary | ICD-10-CM | POA: Diagnosis not present

## 2021-01-08 DIAGNOSIS — J449 Chronic obstructive pulmonary disease, unspecified: Secondary | ICD-10-CM | POA: Diagnosis not present

## 2021-01-08 DIAGNOSIS — G9341 Metabolic encephalopathy: Secondary | ICD-10-CM | POA: Diagnosis not present

## 2021-01-08 DIAGNOSIS — I714 Abdominal aortic aneurysm, without rupture, unspecified: Secondary | ICD-10-CM | POA: Diagnosis not present

## 2021-01-08 DIAGNOSIS — N139 Obstructive and reflux uropathy, unspecified: Secondary | ICD-10-CM | POA: Diagnosis not present

## 2021-01-08 DIAGNOSIS — I10 Essential (primary) hypertension: Secondary | ICD-10-CM | POA: Diagnosis not present

## 2021-01-08 DIAGNOSIS — Z841 Family history of disorders of kidney and ureter: Secondary | ICD-10-CM | POA: Diagnosis not present

## 2021-01-08 DIAGNOSIS — E785 Hyperlipidemia, unspecified: Secondary | ICD-10-CM | POA: Diagnosis not present

## 2021-01-08 DIAGNOSIS — N1330 Unspecified hydronephrosis: Secondary | ICD-10-CM | POA: Diagnosis not present

## 2021-01-08 DIAGNOSIS — R627 Adult failure to thrive: Secondary | ICD-10-CM | POA: Diagnosis not present

## 2021-01-08 DIAGNOSIS — L89154 Pressure ulcer of sacral region, stage 4: Secondary | ICD-10-CM | POA: Diagnosis not present

## 2021-01-08 DIAGNOSIS — L89610 Pressure ulcer of right heel, unstageable: Secondary | ICD-10-CM | POA: Diagnosis not present

## 2021-01-08 DIAGNOSIS — N12 Tubulo-interstitial nephritis, not specified as acute or chronic: Secondary | ICD-10-CM | POA: Diagnosis not present

## 2021-01-08 DIAGNOSIS — L89620 Pressure ulcer of left heel, unstageable: Secondary | ICD-10-CM | POA: Diagnosis present

## 2021-01-08 DIAGNOSIS — A419 Sepsis, unspecified organism: Secondary | ICD-10-CM | POA: Diagnosis not present

## 2021-01-08 DIAGNOSIS — R404 Transient alteration of awareness: Secondary | ICD-10-CM | POA: Diagnosis not present

## 2021-01-08 DIAGNOSIS — G3184 Mild cognitive impairment, so stated: Secondary | ICD-10-CM | POA: Diagnosis not present

## 2021-01-08 DIAGNOSIS — R4182 Altered mental status, unspecified: Secondary | ICD-10-CM | POA: Diagnosis not present

## 2021-01-08 DIAGNOSIS — L89150 Pressure ulcer of sacral region, unstageable: Secondary | ICD-10-CM | POA: Diagnosis not present

## 2021-01-08 DIAGNOSIS — E11649 Type 2 diabetes mellitus with hypoglycemia without coma: Secondary | ICD-10-CM | POA: Diagnosis not present

## 2021-01-08 DIAGNOSIS — L89159 Pressure ulcer of sacral region, unspecified stage: Secondary | ICD-10-CM | POA: Diagnosis not present

## 2021-01-08 DIAGNOSIS — J90 Pleural effusion, not elsewhere classified: Secondary | ICD-10-CM | POA: Diagnosis present

## 2021-01-08 DIAGNOSIS — E43 Unspecified severe protein-calorie malnutrition: Secondary | ICD-10-CM | POA: Diagnosis not present

## 2021-01-08 DIAGNOSIS — R7881 Bacteremia: Secondary | ICD-10-CM | POA: Diagnosis not present

## 2021-01-08 DIAGNOSIS — N400 Enlarged prostate without lower urinary tract symptoms: Secondary | ICD-10-CM | POA: Diagnosis not present

## 2021-01-08 DIAGNOSIS — T83511A Infection and inflammatory reaction due to indwelling urethral catheter, initial encounter: Secondary | ICD-10-CM | POA: Diagnosis not present

## 2021-01-08 DIAGNOSIS — Z515 Encounter for palliative care: Secondary | ICD-10-CM | POA: Diagnosis not present

## 2021-01-08 DIAGNOSIS — Z66 Do not resuscitate: Secondary | ICD-10-CM | POA: Diagnosis not present

## 2021-01-08 DIAGNOSIS — R6520 Severe sepsis without septic shock: Secondary | ICD-10-CM | POA: Diagnosis not present

## 2021-01-08 DIAGNOSIS — N179 Acute kidney failure, unspecified: Secondary | ICD-10-CM | POA: Diagnosis not present

## 2021-01-08 DIAGNOSIS — L89214 Pressure ulcer of right hip, stage 4: Secondary | ICD-10-CM | POA: Diagnosis not present

## 2021-01-08 DIAGNOSIS — D509 Iron deficiency anemia, unspecified: Secondary | ICD-10-CM | POA: Diagnosis present

## 2021-01-08 DIAGNOSIS — I87303 Chronic venous hypertension (idiopathic) without complications of bilateral lower extremity: Secondary | ICD-10-CM | POA: Diagnosis not present

## 2021-01-08 DIAGNOSIS — E039 Hypothyroidism, unspecified: Secondary | ICD-10-CM | POA: Diagnosis not present

## 2021-01-08 DIAGNOSIS — L89219 Pressure ulcer of right hip, unspecified stage: Secondary | ICD-10-CM | POA: Diagnosis not present

## 2021-01-08 DIAGNOSIS — L89629 Pressure ulcer of left heel, unspecified stage: Secondary | ICD-10-CM | POA: Diagnosis not present

## 2021-01-09 DIAGNOSIS — E785 Hyperlipidemia, unspecified: Secondary | ICD-10-CM

## 2021-01-09 DIAGNOSIS — I10 Essential (primary) hypertension: Secondary | ICD-10-CM

## 2021-01-09 DIAGNOSIS — N179 Acute kidney failure, unspecified: Secondary | ICD-10-CM | POA: Diagnosis not present

## 2021-01-09 DIAGNOSIS — N12 Tubulo-interstitial nephritis, not specified as acute or chronic: Secondary | ICD-10-CM | POA: Diagnosis not present

## 2021-01-09 DIAGNOSIS — Z978 Presence of other specified devices: Secondary | ICD-10-CM | POA: Diagnosis not present

## 2021-01-09 DIAGNOSIS — J449 Chronic obstructive pulmonary disease, unspecified: Secondary | ICD-10-CM | POA: Diagnosis not present

## 2021-01-09 DIAGNOSIS — N320 Bladder-neck obstruction: Secondary | ICD-10-CM | POA: Diagnosis not present

## 2021-01-10 DIAGNOSIS — T83511A Infection and inflammatory reaction due to indwelling urethral catheter, initial encounter: Secondary | ICD-10-CM | POA: Diagnosis present

## 2021-01-10 DIAGNOSIS — N400 Enlarged prostate without lower urinary tract symptoms: Secondary | ICD-10-CM | POA: Diagnosis not present

## 2021-01-10 DIAGNOSIS — J449 Chronic obstructive pulmonary disease, unspecified: Secondary | ICD-10-CM | POA: Diagnosis present

## 2021-01-10 DIAGNOSIS — N320 Bladder-neck obstruction: Secondary | ICD-10-CM | POA: Diagnosis not present

## 2021-01-10 DIAGNOSIS — I87303 Chronic venous hypertension (idiopathic) without complications of bilateral lower extremity: Secondary | ICD-10-CM | POA: Diagnosis not present

## 2021-01-10 DIAGNOSIS — R4020 Unspecified coma: Secondary | ICD-10-CM | POA: Diagnosis not present

## 2021-01-10 DIAGNOSIS — D509 Iron deficiency anemia, unspecified: Secondary | ICD-10-CM | POA: Diagnosis present

## 2021-01-10 DIAGNOSIS — N139 Obstructive and reflux uropathy, unspecified: Secondary | ICD-10-CM | POA: Diagnosis not present

## 2021-01-10 DIAGNOSIS — I10 Essential (primary) hypertension: Secondary | ICD-10-CM | POA: Diagnosis present

## 2021-01-10 DIAGNOSIS — R6520 Severe sepsis without septic shock: Secondary | ICD-10-CM | POA: Diagnosis not present

## 2021-01-10 DIAGNOSIS — K5900 Constipation, unspecified: Secondary | ICD-10-CM | POA: Diagnosis not present

## 2021-01-10 DIAGNOSIS — L89210 Pressure ulcer of right hip, unstageable: Secondary | ICD-10-CM | POA: Diagnosis present

## 2021-01-10 DIAGNOSIS — R6521 Severe sepsis with septic shock: Secondary | ICD-10-CM | POA: Diagnosis present

## 2021-01-10 DIAGNOSIS — L89150 Pressure ulcer of sacral region, unstageable: Secondary | ICD-10-CM | POA: Diagnosis present

## 2021-01-10 DIAGNOSIS — Z515 Encounter for palliative care: Secondary | ICD-10-CM | POA: Diagnosis not present

## 2021-01-10 DIAGNOSIS — N1330 Unspecified hydronephrosis: Secondary | ICD-10-CM | POA: Diagnosis not present

## 2021-01-10 DIAGNOSIS — L89610 Pressure ulcer of right heel, unstageable: Secondary | ICD-10-CM | POA: Diagnosis present

## 2021-01-10 DIAGNOSIS — E43 Unspecified severe protein-calorie malnutrition: Secondary | ICD-10-CM | POA: Diagnosis present

## 2021-01-10 DIAGNOSIS — Z978 Presence of other specified devices: Secondary | ICD-10-CM | POA: Diagnosis not present

## 2021-01-10 DIAGNOSIS — R627 Adult failure to thrive: Secondary | ICD-10-CM | POA: Diagnosis not present

## 2021-01-10 DIAGNOSIS — Z86718 Personal history of other venous thrombosis and embolism: Secondary | ICD-10-CM | POA: Diagnosis not present

## 2021-01-10 DIAGNOSIS — E11649 Type 2 diabetes mellitus with hypoglycemia without coma: Secondary | ICD-10-CM | POA: Diagnosis not present

## 2021-01-10 DIAGNOSIS — N10 Acute tubulo-interstitial nephritis: Secondary | ICD-10-CM | POA: Diagnosis not present

## 2021-01-10 DIAGNOSIS — Z87891 Personal history of nicotine dependence: Secondary | ICD-10-CM | POA: Diagnosis not present

## 2021-01-10 DIAGNOSIS — R404 Transient alteration of awareness: Secondary | ICD-10-CM | POA: Diagnosis not present

## 2021-01-10 DIAGNOSIS — G3184 Mild cognitive impairment, so stated: Secondary | ICD-10-CM | POA: Diagnosis not present

## 2021-01-10 DIAGNOSIS — Z66 Do not resuscitate: Secondary | ICD-10-CM | POA: Diagnosis present

## 2021-01-10 DIAGNOSIS — R531 Weakness: Secondary | ICD-10-CM | POA: Diagnosis not present

## 2021-01-10 DIAGNOSIS — N3289 Other specified disorders of bladder: Secondary | ICD-10-CM | POA: Diagnosis not present

## 2021-01-10 DIAGNOSIS — R4182 Altered mental status, unspecified: Secondary | ICD-10-CM | POA: Diagnosis not present

## 2021-01-10 DIAGNOSIS — E162 Hypoglycemia, unspecified: Secondary | ICD-10-CM | POA: Diagnosis present

## 2021-01-10 DIAGNOSIS — Y738 Miscellaneous gastroenterology and urology devices associated with adverse incidents, not elsewhere classified: Secondary | ICD-10-CM | POA: Diagnosis present

## 2021-01-10 DIAGNOSIS — L89893 Pressure ulcer of other site, stage 3: Secondary | ICD-10-CM | POA: Diagnosis not present

## 2021-01-10 DIAGNOSIS — Z833 Family history of diabetes mellitus: Secondary | ICD-10-CM | POA: Diagnosis not present

## 2021-01-10 DIAGNOSIS — E860 Dehydration: Secondary | ICD-10-CM | POA: Diagnosis present

## 2021-01-10 DIAGNOSIS — Z20822 Contact with and (suspected) exposure to covid-19: Secondary | ICD-10-CM | POA: Diagnosis present

## 2021-01-10 DIAGNOSIS — A419 Sepsis, unspecified organism: Secondary | ICD-10-CM | POA: Diagnosis present

## 2021-01-10 DIAGNOSIS — Z841 Family history of disorders of kidney and ureter: Secondary | ICD-10-CM | POA: Diagnosis not present

## 2021-01-10 DIAGNOSIS — E161 Other hypoglycemia: Secondary | ICD-10-CM | POA: Diagnosis not present

## 2021-01-10 DIAGNOSIS — J90 Pleural effusion, not elsewhere classified: Secondary | ICD-10-CM | POA: Diagnosis present

## 2021-01-10 DIAGNOSIS — L89154 Pressure ulcer of sacral region, stage 4: Secondary | ICD-10-CM | POA: Diagnosis not present

## 2021-01-10 DIAGNOSIS — L89214 Pressure ulcer of right hip, stage 4: Secondary | ICD-10-CM | POA: Diagnosis not present

## 2021-01-10 DIAGNOSIS — G9341 Metabolic encephalopathy: Secondary | ICD-10-CM | POA: Diagnosis present

## 2021-01-10 DIAGNOSIS — L8990 Pressure ulcer of unspecified site, unspecified stage: Secondary | ICD-10-CM | POA: Diagnosis not present

## 2021-01-10 DIAGNOSIS — I739 Peripheral vascular disease, unspecified: Secondary | ICD-10-CM | POA: Diagnosis present

## 2021-01-10 DIAGNOSIS — I714 Abdominal aortic aneurysm, without rupture, unspecified: Secondary | ICD-10-CM | POA: Diagnosis not present

## 2021-01-10 DIAGNOSIS — R410 Disorientation, unspecified: Secondary | ICD-10-CM | POA: Diagnosis not present

## 2021-01-10 DIAGNOSIS — I7143 Infrarenal abdominal aortic aneurysm, without rupture: Secondary | ICD-10-CM | POA: Diagnosis not present

## 2021-01-10 DIAGNOSIS — K219 Gastro-esophageal reflux disease without esophagitis: Secondary | ICD-10-CM | POA: Diagnosis not present

## 2021-01-10 DIAGNOSIS — J9811 Atelectasis: Secondary | ICD-10-CM | POA: Diagnosis present

## 2021-01-10 DIAGNOSIS — L89620 Pressure ulcer of left heel, unstageable: Secondary | ICD-10-CM | POA: Diagnosis present

## 2021-01-12 DIAGNOSIS — J449 Chronic obstructive pulmonary disease, unspecified: Secondary | ICD-10-CM | POA: Diagnosis not present

## 2021-01-12 DIAGNOSIS — K5900 Constipation, unspecified: Secondary | ICD-10-CM | POA: Diagnosis not present

## 2021-01-12 DIAGNOSIS — N10 Acute tubulo-interstitial nephritis: Secondary | ICD-10-CM | POA: Diagnosis not present

## 2021-01-12 DIAGNOSIS — I714 Abdominal aortic aneurysm, without rupture, unspecified: Secondary | ICD-10-CM | POA: Diagnosis not present

## 2021-01-12 DIAGNOSIS — N400 Enlarged prostate without lower urinary tract symptoms: Secondary | ICD-10-CM | POA: Diagnosis not present

## 2021-01-12 DIAGNOSIS — K219 Gastro-esophageal reflux disease without esophagitis: Secondary | ICD-10-CM | POA: Diagnosis not present

## 2021-01-12 DIAGNOSIS — R627 Adult failure to thrive: Secondary | ICD-10-CM | POA: Diagnosis not present

## 2021-01-12 DIAGNOSIS — L8990 Pressure ulcer of unspecified site, unspecified stage: Secondary | ICD-10-CM | POA: Diagnosis not present

## 2021-01-12 DIAGNOSIS — I87303 Chronic venous hypertension (idiopathic) without complications of bilateral lower extremity: Secondary | ICD-10-CM | POA: Diagnosis not present

## 2021-01-15 DIAGNOSIS — L8990 Pressure ulcer of unspecified site, unspecified stage: Secondary | ICD-10-CM | POA: Diagnosis not present

## 2021-01-15 DIAGNOSIS — I10 Essential (primary) hypertension: Secondary | ICD-10-CM | POA: Diagnosis not present

## 2021-01-15 DIAGNOSIS — L89893 Pressure ulcer of other site, stage 3: Secondary | ICD-10-CM | POA: Diagnosis not present

## 2021-01-15 DIAGNOSIS — N320 Bladder-neck obstruction: Secondary | ICD-10-CM | POA: Diagnosis not present

## 2021-01-15 DIAGNOSIS — L89154 Pressure ulcer of sacral region, stage 4: Secondary | ICD-10-CM | POA: Diagnosis not present

## 2021-01-15 DIAGNOSIS — L89610 Pressure ulcer of right heel, unstageable: Secondary | ICD-10-CM | POA: Diagnosis not present

## 2021-01-15 DIAGNOSIS — L89214 Pressure ulcer of right hip, stage 4: Secondary | ICD-10-CM | POA: Diagnosis not present

## 2021-01-15 DIAGNOSIS — J449 Chronic obstructive pulmonary disease, unspecified: Secondary | ICD-10-CM | POA: Diagnosis not present

## 2021-01-19 DIAGNOSIS — L8990 Pressure ulcer of unspecified site, unspecified stage: Secondary | ICD-10-CM | POA: Diagnosis not present

## 2021-01-19 DIAGNOSIS — R627 Adult failure to thrive: Secondary | ICD-10-CM | POA: Diagnosis not present

## 2021-01-19 DIAGNOSIS — I10 Essential (primary) hypertension: Secondary | ICD-10-CM | POA: Diagnosis not present

## 2021-01-19 DIAGNOSIS — N320 Bladder-neck obstruction: Secondary | ICD-10-CM | POA: Diagnosis not present

## 2021-01-19 DIAGNOSIS — J449 Chronic obstructive pulmonary disease, unspecified: Secondary | ICD-10-CM | POA: Diagnosis not present

## 2021-01-20 ENCOUNTER — Emergency Department (HOSPITAL_BASED_OUTPATIENT_CLINIC_OR_DEPARTMENT_OTHER): Payer: Medicare HMO

## 2021-01-20 ENCOUNTER — Encounter (HOSPITAL_BASED_OUTPATIENT_CLINIC_OR_DEPARTMENT_OTHER): Payer: Self-pay

## 2021-01-20 ENCOUNTER — Emergency Department (HOSPITAL_COMMUNITY)
Admission: EM | Admit: 2021-01-20 | Discharge: 2021-01-20 | Disposition: A | Discharge status: Other Acute Inpatient Hospital | Payer: Medicare HMO

## 2021-01-20 ENCOUNTER — Inpatient Hospital Stay (HOSPITAL_BASED_OUTPATIENT_CLINIC_OR_DEPARTMENT_OTHER)
Admission: EM | Admit: 2021-01-20 | Discharge: 2021-02-10 | DRG: 698 | Disposition: E | Payer: Medicare HMO | Attending: Internal Medicine | Admitting: Internal Medicine

## 2021-01-20 ENCOUNTER — Other Ambulatory Visit: Payer: Self-pay

## 2021-01-20 DIAGNOSIS — L89620 Pressure ulcer of left heel, unstageable: Secondary | ICD-10-CM | POA: Diagnosis present

## 2021-01-20 DIAGNOSIS — M7989 Other specified soft tissue disorders: Secondary | ICD-10-CM | POA: Diagnosis present

## 2021-01-20 DIAGNOSIS — L89610 Pressure ulcer of right heel, unstageable: Secondary | ICD-10-CM | POA: Diagnosis present

## 2021-01-20 DIAGNOSIS — G912 (Idiopathic) normal pressure hydrocephalus: Secondary | ICD-10-CM | POA: Diagnosis present

## 2021-01-20 DIAGNOSIS — N3289 Other specified disorders of bladder: Secondary | ICD-10-CM | POA: Diagnosis not present

## 2021-01-20 DIAGNOSIS — E11649 Type 2 diabetes mellitus with hypoglycemia without coma: Secondary | ICD-10-CM | POA: Diagnosis not present

## 2021-01-20 DIAGNOSIS — L89210 Pressure ulcer of right hip, unstageable: Secondary | ICD-10-CM | POA: Diagnosis present

## 2021-01-20 DIAGNOSIS — Z86718 Personal history of other venous thrombosis and embolism: Secondary | ICD-10-CM

## 2021-01-20 DIAGNOSIS — J449 Chronic obstructive pulmonary disease, unspecified: Secondary | ICD-10-CM | POA: Diagnosis present

## 2021-01-20 DIAGNOSIS — R4020 Unspecified coma: Secondary | ICD-10-CM | POA: Diagnosis not present

## 2021-01-20 DIAGNOSIS — Z20822 Contact with and (suspected) exposure to covid-19: Secondary | ICD-10-CM | POA: Diagnosis not present

## 2021-01-20 DIAGNOSIS — Z66 Do not resuscitate: Secondary | ICD-10-CM | POA: Diagnosis present

## 2021-01-20 DIAGNOSIS — I7143 Infrarenal abdominal aortic aneurysm, without rupture: Secondary | ICD-10-CM | POA: Diagnosis not present

## 2021-01-20 DIAGNOSIS — T68XXXA Hypothermia, initial encounter: Secondary | ICD-10-CM | POA: Diagnosis present

## 2021-01-20 DIAGNOSIS — D509 Iron deficiency anemia, unspecified: Secondary | ICD-10-CM | POA: Diagnosis present

## 2021-01-20 DIAGNOSIS — R4189 Other symptoms and signs involving cognitive functions and awareness: Secondary | ICD-10-CM | POA: Diagnosis present

## 2021-01-20 DIAGNOSIS — Z841 Family history of disorders of kidney and ureter: Secondary | ICD-10-CM

## 2021-01-20 DIAGNOSIS — T83511A Infection and inflammatory reaction due to indwelling urethral catheter, initial encounter: Secondary | ICD-10-CM | POA: Diagnosis not present

## 2021-01-20 DIAGNOSIS — R6521 Severe sepsis with septic shock: Secondary | ICD-10-CM | POA: Diagnosis not present

## 2021-01-20 DIAGNOSIS — E43 Unspecified severe protein-calorie malnutrition: Secondary | ICD-10-CM | POA: Diagnosis present

## 2021-01-20 DIAGNOSIS — N400 Enlarged prostate without lower urinary tract symptoms: Secondary | ICD-10-CM | POA: Diagnosis not present

## 2021-01-20 DIAGNOSIS — L089 Local infection of the skin and subcutaneous tissue, unspecified: Secondary | ICD-10-CM

## 2021-01-20 DIAGNOSIS — Z79890 Hormone replacement therapy (postmenopausal): Secondary | ICD-10-CM

## 2021-01-20 DIAGNOSIS — L89150 Pressure ulcer of sacral region, unstageable: Secondary | ICD-10-CM | POA: Diagnosis present

## 2021-01-20 DIAGNOSIS — Z833 Family history of diabetes mellitus: Secondary | ICD-10-CM

## 2021-01-20 DIAGNOSIS — Z8673 Personal history of transient ischemic attack (TIA), and cerebral infarction without residual deficits: Secondary | ICD-10-CM

## 2021-01-20 DIAGNOSIS — J90 Pleural effusion, not elsewhere classified: Secondary | ICD-10-CM | POA: Diagnosis not present

## 2021-01-20 DIAGNOSIS — E162 Hypoglycemia, unspecified: Secondary | ICD-10-CM | POA: Diagnosis not present

## 2021-01-20 DIAGNOSIS — Z87891 Personal history of nicotine dependence: Secondary | ICD-10-CM | POA: Diagnosis not present

## 2021-01-20 DIAGNOSIS — Z79899 Other long term (current) drug therapy: Secondary | ICD-10-CM

## 2021-01-20 DIAGNOSIS — L8990 Pressure ulcer of unspecified site, unspecified stage: Secondary | ICD-10-CM | POA: Diagnosis present

## 2021-01-20 DIAGNOSIS — R4182 Altered mental status, unspecified: Secondary | ICD-10-CM | POA: Diagnosis not present

## 2021-01-20 DIAGNOSIS — E860 Dehydration: Secondary | ICD-10-CM | POA: Diagnosis present

## 2021-01-20 DIAGNOSIS — Z7189 Other specified counseling: Secondary | ICD-10-CM

## 2021-01-20 DIAGNOSIS — I10 Essential (primary) hypertension: Secondary | ICD-10-CM | POA: Diagnosis present

## 2021-01-20 DIAGNOSIS — A419 Sepsis, unspecified organism: Secondary | ICD-10-CM | POA: Diagnosis not present

## 2021-01-20 DIAGNOSIS — Z978 Presence of other specified devices: Secondary | ICD-10-CM | POA: Diagnosis not present

## 2021-01-20 DIAGNOSIS — K219 Gastro-esophageal reflux disease without esophagitis: Secondary | ICD-10-CM | POA: Diagnosis present

## 2021-01-20 DIAGNOSIS — J9811 Atelectasis: Secondary | ICD-10-CM | POA: Diagnosis present

## 2021-01-20 DIAGNOSIS — E785 Hyperlipidemia, unspecified: Secondary | ICD-10-CM | POA: Diagnosis present

## 2021-01-20 DIAGNOSIS — R410 Disorientation, unspecified: Secondary | ICD-10-CM | POA: Diagnosis not present

## 2021-01-20 DIAGNOSIS — E161 Other hypoglycemia: Secondary | ICD-10-CM | POA: Diagnosis not present

## 2021-01-20 DIAGNOSIS — G9341 Metabolic encephalopathy: Secondary | ICD-10-CM | POA: Diagnosis present

## 2021-01-20 DIAGNOSIS — R680 Hypothermia, not associated with low environmental temperature: Secondary | ICD-10-CM | POA: Diagnosis present

## 2021-01-20 DIAGNOSIS — I714 Abdominal aortic aneurysm, without rupture, unspecified: Secondary | ICD-10-CM | POA: Diagnosis present

## 2021-01-20 DIAGNOSIS — R404 Transient alteration of awareness: Secondary | ICD-10-CM | POA: Diagnosis not present

## 2021-01-20 DIAGNOSIS — Z6822 Body mass index (BMI) 22.0-22.9, adult: Secondary | ICD-10-CM

## 2021-01-20 DIAGNOSIS — Z515 Encounter for palliative care: Secondary | ICD-10-CM | POA: Diagnosis not present

## 2021-01-20 DIAGNOSIS — I959 Hypotension, unspecified: Secondary | ICD-10-CM | POA: Diagnosis present

## 2021-01-20 DIAGNOSIS — Y738 Miscellaneous gastroenterology and urology devices associated with adverse incidents, not elsewhere classified: Secondary | ICD-10-CM | POA: Diagnosis present

## 2021-01-20 DIAGNOSIS — I739 Peripheral vascular disease, unspecified: Secondary | ICD-10-CM | POA: Diagnosis present

## 2021-01-20 DIAGNOSIS — N390 Urinary tract infection, site not specified: Secondary | ICD-10-CM | POA: Diagnosis present

## 2021-01-20 LAB — CBC WITH DIFFERENTIAL/PLATELET
Abs Immature Granulocytes: 0.03 10*3/uL (ref 0.00–0.07)
Basophils Absolute: 0 10*3/uL (ref 0.0–0.1)
Basophils Relative: 1 %
Eosinophils Absolute: 0 10*3/uL (ref 0.0–0.5)
Eosinophils Relative: 1 %
HCT: 23.4 % — ABNORMAL LOW (ref 39.0–52.0)
Hemoglobin: 7.5 g/dL — ABNORMAL LOW (ref 13.0–17.0)
Immature Granulocytes: 1 %
Lymphocytes Relative: 8 %
Lymphs Abs: 0.2 10*3/uL — ABNORMAL LOW (ref 0.7–4.0)
MCH: 26.3 pg (ref 26.0–34.0)
MCHC: 32.1 g/dL (ref 30.0–36.0)
MCV: 82.1 fL (ref 80.0–100.0)
Monocytes Absolute: 0.1 10*3/uL (ref 0.1–1.0)
Monocytes Relative: 5 %
Neutro Abs: 1.8 10*3/uL (ref 1.7–7.7)
Neutrophils Relative %: 84 %
Platelets: 191 10*3/uL (ref 150–400)
RBC: 2.85 MIL/uL — ABNORMAL LOW (ref 4.22–5.81)
RDW: 21.8 % — ABNORMAL HIGH (ref 11.5–15.5)
WBC: 2.1 10*3/uL — ABNORMAL LOW (ref 4.0–10.5)
nRBC: 5.7 % — ABNORMAL HIGH (ref 0.0–0.2)

## 2021-01-20 LAB — URINALYSIS, ROUTINE W REFLEX MICROSCOPIC
Bilirubin Urine: NEGATIVE
Glucose, UA: NEGATIVE mg/dL
Ketones, ur: NEGATIVE mg/dL
Nitrite: NEGATIVE
Protein, ur: 100 mg/dL — AB
Specific Gravity, Urine: 1.015 (ref 1.005–1.030)
WBC, UA: >50 WBC/hpf — ABNORMAL HIGH (ref 0–5)
pH: 5.5 (ref 5.0–8.0)

## 2021-01-20 LAB — CBG MONITORING, ED
Glucose-Capillary: 37 mg/dL — CL (ref 70–99)
Glucose-Capillary: 124 mg/dL — ABNORMAL HIGH (ref 70–99)
Glucose-Capillary: 84 mg/dL (ref 70–99)
Glucose-Capillary: 94 mg/dL (ref 70–99)
Glucose-Capillary: 99 mg/dL (ref 70–99)
Glucose-Capillary: 115 mg/dL — ABNORMAL HIGH (ref 70–99)
Glucose-Capillary: 107 mg/dL — ABNORMAL HIGH (ref 70–99)
Glucose-Capillary: 123 mg/dL — ABNORMAL HIGH (ref 70–99)

## 2021-01-20 LAB — RESP PANEL BY RT-PCR (FLU A&B, COVID) ARPGX2
Influenza A by PCR: NEGATIVE
Influenza B by PCR: NEGATIVE
SARS Coronavirus 2 by RT PCR: NEGATIVE

## 2021-01-20 LAB — CREATININE, SERUM
Creatinine, Ser: 1 mg/dL (ref 0.61–1.24)
GFR, Estimated: >60 mL/min (ref >60)

## 2021-01-20 LAB — CBC
HCT: 21.7 % — ABNORMAL LOW (ref 39.0–52.0)
Hemoglobin: 7 g/dL — ABNORMAL LOW (ref 13.0–17.0)
MCH: 27.1 pg (ref 26.0–34.0)
MCHC: 32.3 g/dL (ref 30.0–36.0)
MCV: 84.1 fL (ref 80.0–100.0)
Platelets: 158 10*3/uL (ref 150–400)
RBC: 2.58 MIL/uL — ABNORMAL LOW (ref 4.22–5.81)
RDW: 22.2 % — ABNORMAL HIGH (ref 11.5–15.5)
WBC: 2.6 10*3/uL — ABNORMAL LOW (ref 4.0–10.5)
nRBC: 4.6 % — ABNORMAL HIGH (ref 0.0–0.2)

## 2021-01-20 LAB — LACTIC ACID, PLASMA
Lactic Acid, Venous: 1 mmol/L (ref 0.5–1.9)
Lactic Acid, Venous: 0.8 mmol/L (ref 0.5–1.9)
Lactic Acid, Venous: 1.3 mmol/L (ref 0.5–1.9)
Lactic Acid, Venous: 1.6 mmol/L (ref 0.5–1.9)
Lactic Acid, Venous: 2.1 mmol/L (ref 0.5–1.9)

## 2021-01-20 LAB — LIPASE, BLOOD: Lipase: 40 U/L (ref 11–51)

## 2021-01-20 LAB — COMPREHENSIVE METABOLIC PANEL
ALT: 14 U/L (ref 0–44)
AST: 35 U/L (ref 15–41)
Albumin: 2.2 g/dL — ABNORMAL LOW (ref 3.5–5.0)
Alkaline Phosphatase: 64 U/L (ref 38–126)
Anion gap: 6 (ref 5–15)
BUN: 29 mg/dL — ABNORMAL HIGH (ref 8–23)
CO2: 22 mmol/L (ref 22–32)
Calcium: 8.8 mg/dL — ABNORMAL LOW (ref 8.9–10.3)
Chloride: 110 mmol/L (ref 98–111)
Creatinine, Ser: 0.99 mg/dL (ref 0.61–1.24)
GFR, Estimated: >60 mL/min (ref >60)
Glucose, Bld: 90 mg/dL (ref 70–99)
Potassium: 4.3 mmol/L (ref 3.5–5.1)
Sodium: 138 mmol/L (ref 135–145)
Total Bilirubin: 0.3 mg/dL (ref 0.3–1.2)
Total Protein: 4.9 g/dL — ABNORMAL LOW (ref 6.5–8.1)

## 2021-01-20 LAB — GLUCOSE, CAPILLARY
Glucose-Capillary: 116 mg/dL — ABNORMAL HIGH (ref 70–99)
Glucose-Capillary: 135 mg/dL — ABNORMAL HIGH (ref 70–99)

## 2021-01-20 LAB — CK: Total CK: 91 U/L (ref 49–397)

## 2021-01-20 MED ORDER — PANTOPRAZOLE SODIUM 40 MG PO TBEC: 40.0000 mg | DELAYED_RELEASE_TABLET | Freq: Every day | ORAL | Status: DC

## 2021-01-20 MED ORDER — LACTATED RINGERS IV BOLUS
1000.0000 mL | Freq: Once | INTRAVENOUS | Status: AC
  Administered 2021-01-20: 1000 mL via INTRAVENOUS

## 2021-01-20 MED ORDER — VANCOMYCIN HCL IN DEXTROSE 1-5 GM/200ML-% IV SOLN: 1000.0000 mg | Freq: Once | INTRAVENOUS | Status: DC

## 2021-01-20 MED ORDER — PIPERACILLIN-TAZOBACTAM 3.375 G IVPB
3.3750 g | Freq: Three times a day (TID) | INTRAVENOUS | Status: DC
  Administered 2021-01-20 – 2021-01-21 (×3): 3.375 g via INTRAVENOUS
  Filled 2021-01-20 (×3): qty 50

## 2021-01-20 MED ORDER — VANCOMYCIN HCL IN DEXTROSE 1-5 GM/200ML-% IV SOLN
1000.0000 mg | Freq: Once | INTRAVENOUS | Status: AC
  Administered 2021-01-20: 1000 mg via INTRAVENOUS
  Filled 2021-01-20: qty 200

## 2021-01-20 MED ORDER — VANCOMYCIN HCL 750 MG/150ML IV SOLN
750.0000 mg | INTRAVENOUS | Status: DC
  Filled 2021-01-20: qty 150

## 2021-01-20 MED ORDER — IOHEXOL 300 MG/ML  SOLN
75.0000 mL | Freq: Once | INTRAMUSCULAR | Status: AC | PRN
  Administered 2021-01-20: 75 mL via INTRAVENOUS

## 2021-01-20 MED ORDER — ENOXAPARIN SODIUM 40 MG/0.4ML IJ SOSY
40.0000 mg | PREFILLED_SYRINGE | INTRAMUSCULAR | Status: DC
  Administered 2021-01-21: 40 mg via SUBCUTANEOUS
  Filled 2021-01-20: qty 0.4

## 2021-01-20 MED ORDER — ASCORBIC ACID 500 MG PO TABS: 500.0000 mg | ORAL_TABLET | Freq: Two times a day (BID) | ORAL | Status: DC

## 2021-01-20 MED ORDER — SODIUM CHLORIDE 0.9 % IV BOLUS
1000.0000 mL | Freq: Once | INTRAVENOUS | Status: AC
  Administered 2021-01-20: 1000 mL via INTRAVENOUS

## 2021-01-20 MED ORDER — PROSOURCE PLUS PO LIQD: 30.0000 mL | Freq: Two times a day (BID) | ORAL | Status: DC

## 2021-01-20 MED ORDER — VANCOMYCIN HCL IN DEXTROSE 1-5 GM/200ML-% IV SOLN: 1000.0000 mg | INTRAVENOUS | Status: DC

## 2021-01-20 MED ORDER — LACTATED RINGERS IV BOLUS
500.0000 mL | Freq: Once | INTRAVENOUS | Status: AC
  Administered 2021-01-20: 500 mL via INTRAVENOUS

## 2021-01-20 MED ORDER — DOCUSATE SODIUM 100 MG PO CAPS: 100.0000 mg | ORAL_CAPSULE | Freq: Two times a day (BID) | ORAL | Status: DC

## 2021-01-20 MED ORDER — DEXTROSE 50 % IV SOLN
INTRAVENOUS | Status: AC
  Administered 2021-01-20: 50 mL
  Filled 2021-01-20: qty 50

## 2021-01-20 MED ORDER — COLLAGENASE 250 UNIT/GM EX OINT
TOPICAL_OINTMENT | Freq: Every day | CUTANEOUS | Status: DC
  Filled 2021-01-20: qty 30

## 2021-01-20 MED ORDER — PRO-STAT MAX PO LIQD: 30.0000 mL | Freq: Two times a day (BID) | ORAL | Status: DC

## 2021-01-20 MED ORDER — CHLORHEXIDINE GLUCONATE CLOTH 2 % EX PADS
6.0000 | MEDICATED_PAD | Freq: Every day | CUTANEOUS | Status: DC
  Administered 2021-01-20: 6 via TOPICAL

## 2021-01-20 MED ORDER — FOLIC ACID 1 MG PO TABS: 1.0000 mg | ORAL_TABLET | Freq: Every day | ORAL | Status: DC

## 2021-01-20 MED ORDER — SODIUM CHLORIDE 0.9 % IV BOLUS
500.0000 mL | Freq: Once | INTRAVENOUS | Status: AC
  Administered 2021-01-21: 500 mL via INTRAVENOUS

## 2021-01-20 MED ORDER — ADULT MULTIVITAMIN W/MINERALS CH: 1.0000 | ORAL_TABLET | Freq: Every day | ORAL | Status: DC

## 2021-01-20 MED ORDER — FERROUS SULFATE 325 (65 FE) MG PO TABS: 325.0000 mg | ORAL_TABLET | Freq: Every day | ORAL | Status: DC

## 2021-01-20 MED ORDER — DEXTROSE 10 % IV SOLN: INTRAVENOUS | Status: DC

## 2021-01-20 MED ORDER — PIPERACILLIN-TAZOBACTAM 3.375 G IVPB 30 MIN: 3.3750 g | Freq: Four times a day (QID) | INTRAVENOUS | Status: DC

## 2021-01-20 MED ORDER — LEVOTHYROXINE SODIUM 75 MCG PO TABS: 75.0000 ug | ORAL_TABLET | Freq: Every day | ORAL | Status: DC

## 2021-01-20 MED ORDER — DEXTROSE 50 % IV SOLN: 50.0000 mL | Freq: Once | INTRAVENOUS | Status: AC

## 2021-01-20 MED ORDER — ALBUTEROL SULFATE (2.5 MG/3ML) 0.083% IN NEBU
2.5000 mg | INHALATION_SOLUTION | Freq: Four times a day (QID) | RESPIRATORY_TRACT | Status: DC | PRN
  Administered 2021-01-21: 2.5 mg via RESPIRATORY_TRACT
  Filled 2021-01-20: qty 3

## 2021-01-20 MED ORDER — PIPERACILLIN-TAZOBACTAM 3.375 G IVPB 30 MIN
3.3750 g | Freq: Once | INTRAVENOUS | Status: AC
  Administered 2021-01-20: 3.375 g via INTRAVENOUS
  Filled 2021-01-20: qty 50

## 2021-01-20 MED ORDER — VANCOMYCIN HCL 1250 MG/250ML IV SOLN
1250.0000 mg | Freq: Once | INTRAVENOUS | Status: DC
  Filled 2021-01-20: qty 250

## 2021-01-20 NOTE — ED Notes (Signed)
EKG taken in error.

## 2021-01-20 NOTE — H&P (Addendum)
History and Physical  Henry Carter CXK:[id number] DOB: [birth year] DOA: 01/29/2021  Referring physician: Dr. Wyvonnia Carter PCP: Henry Koch, MD  Outpatient Specialists: None Patient coming from: [facility] & is UNable to ambulate   Chief Complaint: Hypoglycemia  HPI: Henry Carter is a 80 y.o. male with medical history significant for normal pressures of hydrocephalus, cognitive impairment, iron deficiency anemia, left leg DVT, history of stroke, indwelling Foley catheter, decubitus ulcers denies history of hypoglycemia or diabetes even though his last A1c done in Carter 2022 was 6.0.  He was brought from the nursing facility because they reported that his blood sugar was 29.  EMS was called EMS check sugar and was 42 which improved to 84 with 10% dextrose infusion.  Patient is said to have been living on his own about 4 months prior and has steadily declined.  Patient has a chronically indwelling Foley catheter On presentation to the emergency room he was found to have low blood sugar and was given a total of 3 boluses of 1000 mils of normal saline/lactated Ringer solution he was also given dextrose 10 infusion.  History was obtained from ER record as patient is nonverbal at I also spoke with his wife but she was not able to give too much history as to what happened I attempted 3 times to call the [facility] I was transferred to the nursing but no one picked the call  ED Course: While in the emergency room he was noted to be hypotensive and he received a total of 4 L of lactated Ringer's solution his blood pressure improved to 105/60 his lactic level was normal it was 1.3, WBC is 2.1, hemoglobin 7.5 with hematocrit of 23.4 RDW 21.8 his latest sugar is 116 he is currently on D5 at 100 mils per hour.  He also received Zosyn and vancomycin.  Blood cultures have been obtained and are pending urinalysis hazy small blood large leukocyte few bacteria  budding yeast mucus RBC 21-50 WBC in clumps and greater than 50 white blood cells.  COVID-19 test negative, influenza PCR negative Head CT was negative chest x-ray, persistent lower lobe process likely combination of pleural effusion and atelectasis or infiltrate.  CT of the abdomen and pelvis showing bilateral pleural effusion left greater than the right stable infra renal abdominal aortic aneurysm stable prostate enlargement diffuse bladder wall thickening diffuse mesenteric edema and body wall edema but no overt ascites  Review of Systems:  patient unable to answer questions  Past Medical History:  Diagnosis Date    iron deficiency 01/18/2013   Angioedema    BPH (benign prostatic hyperplasia)    COPD (chronic obstructive pulmonary disease) ([place])    STates he has copd   Essential hypertension    GERD (gastroesophageal reflux disease)    Helicobacter pylori gastritis 10/10/2012   Hyperlipidemia    IBS (irritable bowel syndrome)    Impotence    Peripheral vascular disease, unspecified ([place])    Personal history of colonic adenoma 09/22/2012   Personal history of tobacco use, presenting hazards to health    Past Surgical History:  Procedure Laterality Date   abdominal surg for ulcers'  1980   COLONOSCOPY N/A 09/22/2012   Procedure: COLONOSCOPY;  Surgeon: Henry Mayer, MD;  Location: [facility];  Service: Endoscopy;  Laterality: N/A;   distal aortogram     IVC FILTER INSERTION N/A 06/11/2019   Procedure: IVC FILTER INSERTION;  Surgeon: Henry Sandy, MD;  Location: [facility];  Service: Cardiovascular;  Laterality: N/A;   IVC VENOGRAPHY N/A 06/11/2019   Procedure: IVC Venography;  Surgeon: Henry Sandy, MD;  Location: [facility];  Service: Cardiovascular;  Laterality: N/A;   percutaneous transluminal angionplasty with placement of 2 self expanding stent in the distal left superfical femeral artery      Social History:  reports that he quit  smoking about 6 years ago. His smoking use included cigarettes. He started smoking about 63 years ago. He has a 12.50 pack-year smoking history. He has never used smokeless tobacco. He reports current drug use. He reports that he does not drink alcohol.   Allergies  Allergen Reactions   Ace Inhibitors     REACTION: angioedema    Family History  Problem Relation Age of Onset   Alcohol abuse Father    Kidney disease Brother    Drug abuse Brother    Diabetes Brother    Diabetes Mother    Diabetes Brother    Colon cancer Neg Hx       Prior to Admission medications   Medication Sig Start Date End Date Taking? Authorizing Provider  Amino Acids-Protein Hydrolys (PRO-STAT MAX) LIQD Take 30 mLs by mouth 2 (two) times daily.   Yes [provider]  ciprofloxacin (CIPRO) 500 MG tablet Take 500 mg by mouth 2 (two) times daily. 01/08/21  Yes [provider]  Docusate Sodium (DSS) 100 MG CAPS Take 100 mg by mouth 2 (two) times daily.   Yes [provider]  ferrous sulfate 325 (65 FE) MG tablet Take 325 mg by mouth daily with breakfast.   Yes [provider]  finasteride (PROSCAR) 5 MG tablet Take 5 mg by mouth daily.   Yes [provider]  folic acid (FOLVITE) 1 MG tablet Take 1 tablet (1 mg total) by mouth daily. 12/04/20  Yes Henry August, MD  levothyroxine (SYNTHROID) 75 MCG tablet Take 75 mcg by mouth daily. 01/08/21  Yes [provider]  Multiple Vitamin (MULTIVITAMIN WITH MINERALS) TABS tablet Take 1 tablet by mouth daily. 12/04/20  Yes Henry August, MD  pantoprazole (PROTONIX) 40 MG tablet Take 1 tablet (40 mg total) by mouth daily. 12/04/20  Yes Henry August, MD  vitamin C (ASCORBIC ACID) 500 MG tablet Take 500 mg by mouth 2 (two) times daily.   Yes [provider]  albuterol (VENTOLIN HFA) 108 (90 Base) MCG/ACT inhaler Inhale 2 puffs into the lungs every 6 (six) hours as needed for wheezing or shortness of breath. 12/04/20    Henry August, MD  collagenase (SANTYL) ointment Apply topically daily. For 10 days Apply to left hip wounds with black tissue present daily 12/04/20   Henry August, MD  loperamide (IMODIUM) 2 MG capsule Take 1 capsule (2 mg total) by mouth as needed for diarrhea or loose stools. 12/04/20   Henry August, MD    Physical Exam: BP 109/70 (BP Location: Right Leg)   Pulse 61   Temp 98.7 F (37.1 C) (Oral)   Resp 20   SpO2 100%   Exam:  General: 80 y.o. year-old male well developed well nourished in no acute distress.  Awake groans with movement of his lower extremities particularly patient is nonverbal HEENT patient is nonverbal no teeth and he has dry mucous membrane Cardiovascular: Regular rate and rhythm with no rubs or gallops.  No thyromegaly or JVD noted.   Respiratory: Clear to auscultation with no wheezes or rales. Good inspiratory effort. Abdomen: Soft  nontender nondistended with normal bowel sounds x4 quadrants. Musculoskeletal: 2+ lower extremity edema Skin: Multiple decubitus ulcers: Foul-smelling ulcers on sacrum, right hip and both bilateral heels have eschars Psychiatry: Mood is appropriate for condition and setting GU: He has an indwelling Foley catheter.  Which was noted in the ER to be draining cloudy urine with sediment and it was changed           Labs on Admission:  Basic Metabolic Panel: Recent Labs  Lab 02/07/2021 0620  NA 138  K 4.3  CL 110  CO2 22  GLUCOSE 90  BUN 29*  CREATININE 0.99  CALCIUM 8.8*   Liver Function Tests: Recent Labs  Lab 02/08/2021 0620  AST 35  ALT 14  ALKPHOS 64  BILITOT 0.3  PROT 4.9*  ALBUMIN 2.2*   Recent Labs  Lab 01/28/2021 0620  LIPASE 40   No results for input(s): AMMONIA in the last 168 hours. CBC: Recent Labs  Lab 01/19/2021 0620  WBC 2.1*  NEUTROABS 1.8  HGB 7.5*  HCT 23.4*  MCV 82.1  PLT 191   Cardiac Enzymes: Recent Labs  Lab 01/26/2021 0620  CKTOTAL 91    BNP (last 3 results) No results for  input(s): BNP in the last 8760 hours.  ProBNP (last 3 results) No results for input(s): PROBNP in the last 8760 hours.  CBG: Recent Labs  Lab 02/05/2021 1034 01/28/2021 1159 01/24/2021 1342 02/01/2021 1636 01/21/2021 1811  GLUCAP 99 115* 107* 123* 116*    Radiological Exams on Admission: CT Head Wo Contrast  Result Date: 01/19/2021 CLINICAL DATA:  Altered mental status. EXAM: CT HEAD WITHOUT CONTRAST TECHNIQUE: Contiguous axial images were obtained from the base of the skull through the vertex without intravenous contrast. COMPARISON:  Head CT 12/29/2020 FINDINGS: Brain: Stable ventriculomegaly out of proportion to the degree of cerebral atrophy. Stable age related periventricular white matter disease. No CT findings for acute hemispheric infarction or intracranial hemorrhage. No mass lesions. The brainstem and cerebellum are grossly normal. Vascular: Stable vascular calcifications. No aneurysm or hyperdense vessels. Skull: No skull fracture or bone lesions. Sinuses/Orbits: The paranasal sinuses and mastoid air cells are clear. The globes are intact. Remote blow in type fracture involving the right orbit is noted. Other: No scalp lesions or scalp hematoma. IMPRESSION: 1. Stable ventriculomegaly out of proportion to the degree of cerebral atrophy. 2. Stable age related periventricular white matter disease. 3. No acute intracranial findings or mass lesions. Electronically Signed   By: Marijo Sanes M.D.   On: 01/14/2021 07:50   CT ABDOMEN PELVIS W CONTRAST  Result Date: 01/27/2021 CLINICAL DATA:  Abdominal pain. EXAM: CT ABDOMEN AND PELVIS WITH CONTRAST TECHNIQUE: Multidetector CT imaging of the abdomen and pelvis was performed using the standard protocol following bolus administration of intravenous contrast. CONTRAST:  15mL OMNIPAQUE IOHEXOL 300 MG/ML  SOLN COMPARISON:  01/07/2021 FINDINGS: Lower chest: Bilateral pleural effusions, left larger than right with overlying atelectasis, left greater than  right. The heart is within normal limits in size. No pericardial effusion. Stable aortic and coronary artery calcifications. Hepatobiliary: No hepatic lesions or intrahepatic biliary dilatation. The gallbladder is grossly normal. Pancreas: Marked pancreatic atrophy. No mass, inflammation or ductal dilatation. Spleen: Normal size. No focal lesions. Adrenals/Urinary Tract: The adrenal glands and kidneys appear stable. Bilateral renal cysts are noted. The bladder contains a Foley catheter. Diffuse bladder wall thickening is noted. Enlarged prostate gland with median lobe hypertrophy impressing on the base of the bladder. Stomach/Bowel: The stomach, duodenum, small  bowel and colon are grossly normal. No findings for obstructive process. Vascular/Lymphatic: Severe/advanced atherosclerotic calcifications involving the aorta, iliac arteries and branch vessels. Stable 4.3 x 4.2 cm infrarenal abdominal aortic aneurysm extending right down to the bifurcation. Recommend follow-up every 12 months and vascular consultation. This recommendation follows ACR consensus guidelines: White Paper of the ACR Incidental Findings Committee II on Vascular Findings. J Am Coll Radiol 2013; 10:789-794. No abdominal or pelvic lymphadenopathy is identified. There is a IVC filter noted. Reproductive: Stable enlarged prostate gland with median lobe hypertrophy impressing on the base of the bladder. The seminal vesicles are unremarkable. Other: Diffuse mesenteric edema no overt ascites. Diffuse body wall edema. Musculoskeletal: Stable advanced degenerative changes involving the lumbar spine. No bone lesions or fractures. IMPRESSION: 1. Bilateral pleural effusions, left larger than right with overlying atelectasis, left greater than right. 2. Stable 4.3 x 4.2 cm infrarenal abdominal aortic aneurysm. 3. Stable enlarged prostate gland with median lobe hypertrophy impressing on the base of the bladder. 4. Diffuse bladder wall thickening could be due to  chronic bladder outlet obstruction. 5. Diffuse mesenteric edema and body wall edema but no overt ascites. Aortic Atherosclerosis (ICD10-I70.0). Electronically Signed   By: Marijo Sanes M.D.   On: 01/17/2021 08:00   DG Chest Portable 1 View  Result Date: 01/31/2021 CLINICAL DATA:  Hypoglycemia. Altered mental status. Combative. EXAM: PORTABLE CHEST 1 VIEW COMPARISON:  12/29/2020 FINDINGS: The cardiac silhouette, mediastinal and hilar contours are within normal limits. There is tortuosity and calcification of the thoracic aorta. Persistent left lower lobe process likely a combination effusion and atelectasis or infiltrate. The right lung is clear. IMPRESSION: Persistent left lower lobe process, likely a combination pleural effusion and atelectasis or infiltrate. Electronically Signed   By: Marijo Sanes M.D.   On: 01/15/2021 06:40    EKG: Independently reviewed.  No STEMI  Assessment/Plan Present on Admission:  Hypoglycemia  Pressure injury of skin  Hyperlipidemia  Essential hypertension  PERIPHERAL VASCULAR DISEASE  Left leg swelling  Hypothermia  Principal Problem:   Hypoglycemia Active Problems:   Hyperlipidemia   Essential hypertension   PERIPHERAL VASCULAR DISEASE   Left leg swelling   Hypothermia   Pressure injury of skin #1 hypoglycemia.  Patient was noted to have a blood sugar of 29 he received IV dextrose and currently on dextrose infusion with improvement His last hemoglobin A1c was 6.0 he is said not to have a diagnosis of diabetes or on any oral hypoglycemic agent  2.  Multiple sacral ulcers and decubitus ulcer that look quite infected May be a source of infection. Broad-spectrum antibiotics started with vancomycin and Unasyn Wound care consult has been requested  3.  Hypothermia.  Patient initially was unable to get his temperature rectally.  Seems like he has had a history of hypothermia in the past which is already in his chart.  He was covered with warm blanket  patient was on the floor they are not able to put a bear hugger for warming as a result he will be admitted to stepdown unit Apparently nurse was later able to get a temperature as the most recent temperature reading is 98.7 as recorded above  4.  Urinary tract infection chronic indwelling catheter with urinary tract infection this might be a source of infection.  His Foley catheter was changed in the ER patient has been started on vancomycin and Unasyn  5.  Possible sepsis.  Sepsis work-up is in place he is lactic acid was normal however he  is afebrile.  He was hypotensive and had to receive fluid resuscitation Urine culture and blood cultures are still pending  6.  X-ray showing bilateral pleural effusion but he is also looking to be dehydrated and impending sepsis.  Also received multiple IV fluid rehydration we will monitor Will obtain repeat x-ray in the morning   Severity of Illness: The appropriate patient status for this patient is INPATIENT. Inpatient status is judged to be reasonable and necessary in order to provide the required intensity of service to ensure the patient's safety. The patient's presenting symptoms, physical exam findings, and initial radiographic and laboratory data in the context of their chronic comorbidities is felt to place them at high risk for further clinical deterioration. Furthermore, it is not anticipated that the patient will be medically stable for discharge from the hospital within 2 midnights of admission. The following factors support the patient status of inpatient.   " Hypoglycemia, hypothermia needing to be in the stepdown unit possible sepsis  * I certify that at the point of admission it is my clinical judgment that the patient will require inpatient hospital care spanning beyond 2 midnights from the point of admission due to high intensity of service, high risk for further deterioration and high frequency of surveillance required.*   DVT  prophylaxis: Lovenox  Code Status: DNR personally discussed with his wife of record whose contact is on his chart MRS. Phineas Semen he stated that he does not she does not want to to suffer and that he is DNR  Family Communication: Courtenay Creger over the phone  Disposition Plan: To be determined  Consults called: None  Admission status: Inpatient    Cristal Deer MD [facility] Pager [phone]  If 7PM-7AM, please contact night-coverage [email or web address] Password [facility]  01/30/2021, 7:41 PM

## 2021-01-20 NOTE — ED Provider Notes (Addendum)
  Physical Exam  BP 110/62   Pulse 61   Temp 98.7 F (37.1 C) (Oral)   Resp 20   SpO2 100%   Physical Exam  ED Course/Procedures     Procedures  MDM  80 yo male with chronic health problems presents from SNF with hypoglycemia, not on hypoglycemic agents.  Seen and evaluated by Dr. Wyvonnia Dusky.  Initial bs 37<124<90<84 on d10 drip. Broad spectrum antibiotics initiated. History of uti with indwelling foley, multiple pressure ulcers as possible sources, ct abd/pelvis pending. Anemia with hgb 7.5 today, previous range last 6 months 6.6-10.5 Dr. Wyvonnia Dusky discussed with hospitalist (Dr. Olevia Bowens) and plan transfer. Current VS  Vitals:   02/04/2021 0534 02/01/2021 0653  BP: 106/62 110/62  Pulse: 68 61  Resp: (!) 22 20  Temp: 98.7 F (37.1 C)   SpO2: 100% 100%     Patient with episode of hypotension.  I ordered a liter of lactated ringer.  Nurse also called Dr. Olevia Bowens who also ordered a liter of lactated Ringer.  Blood pressure has improved to 105/60.    Pattricia Boss, MD 01/15/2021 1694    Pattricia Boss, MD 01/30/2021 [phone]

## 2021-01-20 NOTE — ED Notes (Signed)
Pt's CBG result was 99. Informed Magda Paganini - RN.

## 2021-01-20 NOTE — Progress Notes (Signed)
Pharmacy Antibiotic Note  Henry Carter is a 80 y.o. male admitted on 01/19/2021 with UTI and decubitis ulcers .  Pharmacy has been consulted for Zosyn dosing. SCr 0.99 on presentation. Zosyn 1x dose already given in the ER.  Plan: Zosyn 3.375g IV q8h (4h infusion) Monitor clinical progress, c/s, renal function F/u de-escalation plan/LOT     Temp (24hrs), Avg:98.7 F (37.1 C), Min:98.7 F (37.1 C), Max:98.7 F (37.1 C)  Recent Labs  Lab 01/30/2021 0620 01/26/2021 0841 02/07/2021 1208  WBC 2.1*  --   --   CREATININE 0.99  --   --   LATICACIDVEN 1.0 0.8 1.3    CrCl cannot be calculated (Unknown ideal weight.).    Allergies  Allergen Reactions   Ace Inhibitors     REACTION: angioedema    Arturo Morton, PharmD, BCPS Please check AMION for all [place] contact numbers Clinical Pharmacist 01/14/2021 3:47 PM

## 2021-01-20 NOTE — Progress Notes (Signed)
Upon arrival to floor patient cool to touch and non-verbal.  Unable to obtain any temp including rectal.  MD made aware and at bedside.  Warm blankets added and room temperature increased.  Temp check again at 1850 and still unable to get any reading rectally.  MD called and made aware of no change.

## 2021-01-20 NOTE — ED Notes (Signed)
Pt's CBG result was 123. Informed Ranette - RN.

## 2021-01-20 NOTE — Progress Notes (Addendum)
    OVERNIGHT PROGRESS REPORT  Notified by RN for continued hypothermia. Patient does not have a monitorable temperature. Environmental controls were started with room warmed and warm blankets applied. Patient's temperature did not increase over a 1 hour period.  Patient is transferred to stepdown and started with warming blanket and temperature monitoring. He appears to be at baseline mentation as found in the H&P    Gershon Cull MSNA MSN [facility]

## 2021-01-20 NOTE — Progress Notes (Addendum)
Pharmacy Antibiotic Note  Henry Carter is a 80 y.o. male admitted from SNF on 01/25/2021 with AMS, hypoglycemia, possible urosepsis.  Recent admission 11/2020 for bacteremia/urosepsis.  Pharmacy has been consulted for Vancomycin  dosing.  Plan: Vancomycin 1000 mg IV now,  then Vancomycin 750 mg IV q24h     Temp (24hrs), Avg:98.7 F (37.1 C), Min:98.7 F (37.1 C), Max:98.7 F (37.1 C)  Recent Labs  Lab 01/18/2021 0620  WBC PENDING  CREATININE 0.99  LATICACIDVEN 1.0    CrCl cannot be calculated (Unknown ideal weight.).    Allergies  Allergen Reactions   Ace Inhibitors     REACTION: angioedema     Henry Carter 02/09/2021 7:16 AM

## 2021-01-20 NOTE — ED Notes (Signed)
Pt's CBG result was 107. Informed Patty - RN.

## 2021-01-20 NOTE — ED Provider Notes (Signed)
[facility] EMERGENCY DEPT Provider Note   CSN: [id number] Arrival date & time: 01/31/2021  8841     History Chief Complaint  Patient presents with   Hypoglycemia    Henry Carter is a 80 y.o. male.  Level 5 caveat for nonverbal and altered mental status.  Patient from nursing facility with report of hypoglycemia.  Unclear why his sugar is being checked.  Outpatient labs yesterday showed a sugar of 29.  EMS was called this evening for a sugar of 42 that improved to 84 with dextrose 10%.  Patient reportedly nonverbal at baseline. It does not appear that he is diabetic or on any insulin He is unable to give any history. Foley in place draining cloudy urine. Foul-smelling wound to his sacrum and right hip.  Discussed with patient daughter Henry Carter by phone.  She was not aware he was coming to the hospital.  She reports no issues with hypoglycemia in the past and he is not a diabetic.  Patient has been progressively deteriorating for the past several months.  He has a chronic indwelling Foley as well as wounds to his buttocks and hips.  He was apparently living on his own 4 months ago and has declined since then. He has had recent admissions both to [facility] for sepsis and bacteremia  The history is provided by the patient.  Hypoglycemia     Past Medical History:  Diagnosis Date    iron deficiency 01/18/2013   Angioedema    BPH (benign prostatic hyperplasia)    COPD (chronic obstructive pulmonary disease) ([place])    STates he has copd   Essential hypertension    GERD (gastroesophageal reflux disease)    Helicobacter pylori gastritis 10/10/2012   Hyperlipidemia    IBS (irritable bowel syndrome)    Impotence    Peripheral vascular disease, unspecified ([place])    Personal history of colonic adenoma 09/22/2012   Personal history of tobacco use, presenting hazards to health     Patient Active Problem List   Diagnosis Date Noted    Palliative care by specialist    Goals of care, counseling/discussion    General weakness    Right leg DVT ([place]) 11/29/2020   Pancytopenia ([place]) 11/29/2020   Bilateral hydronephrosis 11/28/2020   Severe sepsis with acute organ dysfunction ([place]) 11/27/2020   Transaminitis 11/27/2020   Severe protein-calorie malnutrition ([place]) 11/27/2020   Bilateral edema of lower extremity 11/27/2020   Hyperglycemia 11/27/2020   Dehydration 11/27/2020   Hypothermia 11/27/2020   Bladder outlet obstruction 08/17/2019   Abdominal pain 08/17/2019   Chronic indwelling Foley catheter 06/29/2019   History of DVT (deep vein thrombosis) 06/29/2019   Acute bilateral obstructive uropathy    Hypokalemia    Acute metabolic encephalopathy    Hyponatremia    Hematuria 06/09/2019   ARF (acute renal failure) ([place]) 06/09/2019   Acute blood loss anemia 06/09/2019   Hyperkalemia 66/09/3014   Metabolic acidosis 04/20/3233   Left leg DVT ([place]) 05/18/2019   Left leg swelling 05/11/2019   Pre-operative cardiovascular examination 03/22/2019   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[place]) 07/11/2018   Right arm weakness 07/21/2017   Non compliance w medication regimen 07/30/2016   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   Mild cognitive impairment 12/11/2015   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   Healthcare maintenance 02/07/2013   Iron deficiency anemia due to chronic blood loss 01/18/2013   Diarrhea 09/22/2012   C O P D 04/08/2010  Atherosclerosis of native artery of extremity with intermittent claudication ([place]) 11/26/2008   Benign prostatic hyperplasia 10/20/2007   PERIPHERAL VASCULAR DISEASE 05/19/2007   Essential hypertension 02/01/2007   Hyperlipidemia 01/03/2007    Past Surgical History:  Procedure Laterality Date   abdominal surg for ulcers'  1980   COLONOSCOPY N/A 09/22/2012   Procedure: COLONOSCOPY;  Surgeon: Gatha Mayer, MD;  Location: [facility];  Service: Endoscopy;  Laterality: N/A;    distal aortogram     IVC FILTER INSERTION N/A 06/11/2019   Procedure: IVC FILTER INSERTION;  Surgeon: Waynetta Sandy, MD;  Location: [facility];  Service: Cardiovascular;  Laterality: N/A;   IVC VENOGRAPHY N/A 06/11/2019   Procedure: IVC Venography;  Surgeon: Waynetta Sandy, MD;  Location: [facility];  Service: Cardiovascular;  Laterality: N/A;   percutaneous transluminal angionplasty with placement of 2 self expanding stent in the distal left superfical femeral artery         Family History  Problem Relation Age of Onset   Alcohol abuse Father    Kidney disease Brother    Drug abuse Brother    Diabetes Brother    Diabetes Mother    Diabetes Brother    Colon cancer Neg Hx     Social History   Tobacco Use   Smoking status: Former    Packs/day: 0.25    Years: 50.00    Pack years: 12.50    Types: Cigarettes    Start date: 04/12/1957    Quit date: 04/12/2014    Years since quitting: 6.7   Smokeless tobacco: Never   Tobacco comments:    using chatix; stopped now; took x 1 week / had stopped 3 years prior to starting back in 2005  Vaping Use   Vaping Use: Never used  Substance Use Topics   Alcohol use: No    Alcohol/week: 0.0 standard drinks    Comment: OCC   Drug use: Yes    Comment: States marijuanna relaxes him;     Home Medications Prior to Admission medications   Medication Sig Start Date End Date Taking? Authorizing Provider  albuterol (VENTOLIN HFA) 108 (90 Base) MCG/ACT inhaler Inhale 2 puffs into the lungs every 6 (six) hours as needed for wheezing or shortness of breath. 12/04/20   Aline August, MD  collagenase (SANTYL) ointment Apply topically daily. For 10 days Apply to left hip wounds with black tissue present daily 12/04/20   Aline August, MD  ferrous sulfate 325 (65 FE) MG tablet Take 325 mg by mouth daily with breakfast.    [provider]  finasteride (PROSCAR) 5 MG tablet Take 5 mg by mouth daily.    [provider]  folic acid (FOLVITE) 1 MG tablet Take 1 tablet (1 mg total) by mouth daily. 12/04/20   Aline August, MD  loperamide (IMODIUM) 2 MG capsule Take 1 capsule (2 mg total) by mouth as needed for diarrhea or loose stools. 12/04/20   Aline August, MD  Multiple Vitamin (MULTIVITAMIN WITH MINERALS) TABS tablet Take 1 tablet by mouth daily. 12/04/20   Aline August, MD  pantoprazole (PROTONIX) 40 MG tablet Take 1 tablet (40 mg total) by mouth daily. 12/04/20   Aline August, MD    Allergies    Ace inhibitors  Review of Systems   Review of Systems  Unable to perform ROS: Patient nonverbal   Physical Exam Updated Vital Signs BP 106/62 (BP Location: Right Arm)   Pulse 68  Temp 98.7 F (37.1 C) (Oral)   Resp (!) 22   SpO2 100%   Physical Exam Constitutional:      Comments: Chronically ill-appearing holds head to the left side Groans to stimulus but does not follow commands or answer questions.  HENT:     Head: Normocephalic and atraumatic.     Nose: Nose normal.     Mouth/Throat:     Mouth: Mucous membranes are moist.  Cardiovascular:     Rate and Rhythm: Normal rate and regular rhythm.  Pulmonary:     Effort: No respiratory distress.     Breath sounds: No wheezing.  Abdominal:     Tenderness: There is no abdominal tenderness. There is no guarding or rebound.  Genitourinary:    Comments: Foley catheter in place draining cloudy urine with sediment.  Foul-smelling draining wounds to sacrum as well as right ischium Neurological:     Comments: Minimally responsive, does not follow commands or answer questions Nonverbal       ED Results / Procedures / Treatments   Labs (all labs ordered are listed, but only abnormal results are displayed) Labs Reviewed  CBC WITH DIFFERENTIAL/PLATELET - Abnormal; Notable for the following components:      Result Value   WBC 2.1 (*)    RBC 2.85 (*)    Hemoglobin 7.5 (*)    HCT 23.4 (*)    RDW 21.8 (*)    nRBC 5.7 (*)     Lymphs Abs 0.2 (*)    All other components within normal limits  COMPREHENSIVE METABOLIC PANEL - Abnormal; Notable for the following components:   BUN 29 (*)    Calcium 8.8 (*)    Total Protein 4.9 (*)    Albumin 2.2 (*)    All other components within normal limits  URINALYSIS, ROUTINE W REFLEX MICROSCOPIC - Abnormal; Notable for the following components:   APPearance HAZY (*)    Hgb urine dipstick SMALL (*)    Protein, ur 100 (*)    Leukocytes,Ua LARGE (*)    WBC, UA >50 (*)    Bacteria, UA FEW (*)    All other components within normal limits  CBG MONITORING, ED - Abnormal; Notable for the following components:   Glucose-Capillary 37 (*)    All other components within normal limits  CBG MONITORING, ED - Abnormal; Notable for the following components:   Glucose-Capillary 124 (*)    All other components within normal limits  CULTURE, BLOOD (ROUTINE X 2)  CULTURE, BLOOD (ROUTINE X 2)  URINE CULTURE  RESP PANEL BY RT-PCR (FLU A&B, COVID) ARPGX2  LACTIC ACID, PLASMA  LIPASE, BLOOD  CK  LACTIC ACID, PLASMA  CBG MONITORING, ED  CBG MONITORING, ED  CBG MONITORING, ED    EKG None  Radiology No results found.  Procedures .Critical Care Performed by: Ezequiel Essex, MD Authorized by: Ezequiel Essex, MD   Critical care provider statement:    Critical care time (minutes):  45   Critical care time was exclusive of:  Separately billable procedures and treating other patients   Critical care was necessary to treat or prevent imminent or life-threatening deterioration of the following conditions:  Dehydration and endocrine crisis   Critical care was time spent personally by me on the following activities:  Blood draw for specimens, development of treatment plan with patient or surrogate, discussions with consultants, evaluation of patient's response to treatment, examination of patient, obtaining history from patient or surrogate, review of old charts, re-evaluation of  patient's  condition, pulse oximetry, ordering and review of radiographic studies, ordering and review of laboratory studies and ordering and performing treatments and interventions   I assumed direction of critical care for this patient from another provider in my specialty: no     Care discussed with: admitting provider     Medications Ordered in ED Medications  dextrose 50 % solution 50 mL (50 mLs Intravenous Given 01/29/2021 0538)    ED Course  I have reviewed the triage vital signs and the nursing notes.  Pertinent labs & imaging results that were available during my care of the patient were reviewed by me and considered in my medical decision making (see chart for details).    MDM Rules/Calculators/A&P                          Chronically ill patient with chronic Foley catheter, decubitus ulcers here with hypoglycemia.  Patient nonverbal and unable to give any history.  Hypoglycemia treated with D50.  Blood sugar improved transiently 124 but trending down again will initiate D10 drip.  Concerned  he that he is septic from indwelling Foley catheter UTI versus decubitus ulcers versus both.  Infectious work-up pursued.  He is afebrile.  Blood cultures and urine culture will be obtained.  Patient does have a history of recurrent bacteremia.  Will obtain pelvic imaging to evaluate for deep space infection of his decubiti ulcers. Hemoglobin has downtrended to 7.5 from 9-10.  Continue D10 infusion for hypoglycemia. Broad spectrum antibiotics after cultures obtained.  Nursing staff to change Foley catheter. Vitals remain stable. Lactate normal. Does not appear to be septic.   Admission d/w Dr. Olevia Bowens. CT a/p pending.  Dr. Jeanell Sparrow aware of patient at shift change.  Final Clinical Impression(s) / ED Diagnoses Final diagnoses:  Hypoglycemia  Wound infection    Rx / DC Orders ED Discharge Orders     None        Nomie Buchberger, Annie Main, MD 01/18/2021 [phone]

## 2021-01-20 NOTE — ED Notes (Signed)
Report given to [facility] at [facility]

## 2021-01-20 NOTE — ED Triage Notes (Signed)
Pt BIB EMS for hypoglycemia -  per report , his initial  BGL was 42 and came back  up to 84 after  dextrose 10% was given by  EMS en route   Pt is nonverbal  / confused  and that his baseline - pt is from [facility] facility. BGL was 37 on arrival.  Pt is full code status

## 2021-01-20 NOTE — ED Notes (Signed)
Report given to Robin with [facility]

## 2021-01-21 ENCOUNTER — Inpatient Hospital Stay (HOSPITAL_COMMUNITY): Payer: Medicare HMO

## 2021-01-21 ENCOUNTER — Other Ambulatory Visit: Payer: Self-pay

## 2021-01-21 DIAGNOSIS — Z66 Do not resuscitate: Secondary | ICD-10-CM

## 2021-01-21 DIAGNOSIS — A419 Sepsis, unspecified organism: Secondary | ICD-10-CM

## 2021-01-21 DIAGNOSIS — E162 Hypoglycemia, unspecified: Secondary | ICD-10-CM | POA: Diagnosis not present

## 2021-01-21 DIAGNOSIS — G9341 Metabolic encephalopathy: Secondary | ICD-10-CM | POA: Diagnosis not present

## 2021-01-21 DIAGNOSIS — Z978 Presence of other specified devices: Secondary | ICD-10-CM | POA: Diagnosis not present

## 2021-01-21 DIAGNOSIS — R6521 Severe sepsis with septic shock: Secondary | ICD-10-CM

## 2021-01-21 DIAGNOSIS — J90 Pleural effusion, not elsewhere classified: Secondary | ICD-10-CM | POA: Diagnosis present

## 2021-01-21 LAB — CBC
HCT: 20.8 % — ABNORMAL LOW (ref 39.0–52.0)
Hemoglobin: 6.5 g/dL — CL (ref 13.0–17.0)
MCH: 26.1 pg (ref 26.0–34.0)
MCHC: 31.3 g/dL (ref 30.0–36.0)
MCV: 83.5 fL (ref 80.0–100.0)
Platelets: 148 10*3/uL — ABNORMAL LOW (ref 150–400)
RBC: 2.49 MIL/uL — ABNORMAL LOW (ref 4.22–5.81)
RDW: 22.2 % — ABNORMAL HIGH (ref 11.5–15.5)
WBC: 3.3 10*3/uL — ABNORMAL LOW (ref 4.0–10.5)
nRBC: 4.9 % — ABNORMAL HIGH (ref 0.0–0.2)

## 2021-01-21 LAB — BASIC METABOLIC PANEL
Anion gap: 5 (ref 5–15)
BUN: 27 mg/dL — ABNORMAL HIGH (ref 8–23)
CO2: 19 mmol/L — ABNORMAL LOW (ref 22–32)
Calcium: 8.1 mg/dL — ABNORMAL LOW (ref 8.9–10.3)
Chloride: 107 mmol/L (ref 98–111)
Creatinine, Ser: 0.91 mg/dL (ref 0.61–1.24)
GFR, Estimated: >60 mL/min (ref >60)
Glucose, Bld: 121 mg/dL — ABNORMAL HIGH (ref 70–99)
Potassium: 3.9 mmol/L (ref 3.5–5.1)
Sodium: 131 mmol/L — ABNORMAL LOW (ref 135–145)

## 2021-01-21 LAB — LACTIC ACID, PLASMA: Lactic Acid, Venous: 2.6 mmol/L (ref 0.5–1.9)

## 2021-01-21 LAB — HEMOGLOBIN AND HEMATOCRIT, BLOOD
HCT: 29.1 % — ABNORMAL LOW (ref 39.0–52.0)
Hemoglobin: 9.4 g/dL — ABNORMAL LOW (ref 13.0–17.0)

## 2021-01-21 LAB — URINE CULTURE: Culture: >=100000 — AB

## 2021-01-21 LAB — TSH: TSH: 15.247 u[IU]/mL — ABNORMAL HIGH (ref 0.350–4.500)

## 2021-01-21 LAB — PREPARE RBC (CROSSMATCH)

## 2021-01-21 LAB — HEMOGLOBIN A1C
Hgb A1c MFr Bld: 5.6 % (ref 4.8–5.6)
Mean Plasma Glucose: 114.02 mg/dL

## 2021-01-21 LAB — MRSA NEXT GEN BY PCR, NASAL: MRSA by PCR Next Gen: NOT DETECTED

## 2021-01-21 MED ORDER — BIOTENE DRY MOUTH MT LIQD: 15.0000 mL | OROMUCOSAL | Status: DC | PRN

## 2021-01-21 MED ORDER — HALOPERIDOL 0.5 MG PO TABS: 0.5000 mg | ORAL_TABLET | ORAL | Status: DC | PRN

## 2021-01-21 MED ORDER — ONDANSETRON 4 MG PO TBDP: 4.0000 mg | ORAL_TABLET | Freq: Four times a day (QID) | ORAL | Status: DC | PRN

## 2021-01-21 MED ORDER — POLYVINYL ALCOHOL 1.4 % OP SOLN: 1.0000 [drp] | Freq: Four times a day (QID) | OPHTHALMIC | Status: DC | PRN

## 2021-01-21 MED ORDER — SODIUM CHLORIDE 0.9 % IV SOLN
250.0000 mL | INTRAVENOUS | Status: DC
  Administered 2021-01-21: 250 mL via INTRAVENOUS

## 2021-01-21 MED ORDER — JUVEN PO PACK: 1.0000 | PACK | Freq: Two times a day (BID) | ORAL | Status: DC

## 2021-01-21 MED ORDER — ACETAMINOPHEN 650 MG RE SUPP: 650.0000 mg | Freq: Four times a day (QID) | RECTAL | Status: DC | PRN

## 2021-01-21 MED ORDER — GLYCOPYRROLATE 0.2 MG/ML IJ SOLN
0.2000 mg | INTRAMUSCULAR | Status: DC | PRN
  Administered 2021-01-21: 0.2 mg via INTRAVENOUS
  Filled 2021-01-21: qty 1

## 2021-01-21 MED ORDER — MORPHINE SULFATE (PF) 2 MG/ML IV SOLN
1.0000 mg | INTRAVENOUS | Status: DC | PRN
  Administered 2021-01-21 – 2021-01-23 (×5): 1 mg via INTRAVENOUS
  Filled 2021-01-21 (×5): qty 1

## 2021-01-21 MED ORDER — NOREPINEPHRINE 4 MG/250ML-% IV SOLN
2.0000 ug/min | INTRAVENOUS | Status: DC
  Administered 2021-01-21: 10 ug/min via INTRAVENOUS
  Administered 2021-01-21: 2 ug/min via INTRAVENOUS
  Filled 2021-01-21 (×2): qty 250

## 2021-01-21 MED ORDER — HALOPERIDOL LACTATE 5 MG/ML IJ SOLN: 0.5000 mg | INTRAMUSCULAR | Status: DC | PRN

## 2021-01-21 MED ORDER — HYDROCORTISONE SOD SUC (PF) 100 MG IJ SOLR
100.0000 mg | Freq: Once | INTRAMUSCULAR | Status: AC
  Administered 2021-01-21: 100 mg via INTRAVENOUS
  Filled 2021-01-21: qty 2

## 2021-01-21 MED ORDER — GLYCOPYRROLATE 0.2 MG/ML IJ SOLN
0.2000 mg | INTRAMUSCULAR | Status: DC | PRN
  Administered 2021-01-23: 0.2 mg via SUBCUTANEOUS
  Filled 2021-01-21: qty 1

## 2021-01-21 MED ORDER — VANCOMYCIN HCL 1250 MG/250ML IV SOLN
1250.0000 mg | INTRAVENOUS | Status: DC
  Administered 2021-01-21: 1250 mg via INTRAVENOUS
  Filled 2021-01-21: qty 250

## 2021-01-21 MED ORDER — DAKINS (1/4 STRENGTH) 0.125 % EX SOLN
Freq: Every day | CUTANEOUS | Status: DC
  Filled 2021-01-21: qty 473

## 2021-01-21 MED ORDER — CHLORHEXIDINE GLUCONATE 0.12 % MT SOLN
15.0000 mL | Freq: Two times a day (BID) | OROMUCOSAL | Status: DC
  Administered 2021-01-21: 15 mL via OROMUCOSAL
  Filled 2021-01-21: qty 15

## 2021-01-21 MED ORDER — ORAL CARE MOUTH RINSE
15.0000 mL | Freq: Two times a day (BID) | OROMUCOSAL | Status: DC
  Administered 2021-01-21: 15 mL via OROMUCOSAL

## 2021-01-21 MED ORDER — HALOPERIDOL LACTATE 2 MG/ML PO CONC
0.5000 mg | ORAL | Status: DC | PRN
  Filled 2021-01-21: qty 0.3

## 2021-01-21 MED ORDER — ACETAMINOPHEN 325 MG PO TABS: 650.0000 mg | ORAL_TABLET | Freq: Four times a day (QID) | ORAL | Status: DC | PRN

## 2021-01-21 MED ORDER — GLYCOPYRROLATE 1 MG PO TABS: 1.0000 mg | ORAL_TABLET | ORAL | Status: DC | PRN

## 2021-01-21 MED ORDER — ONDANSETRON HCL 4 MG/2ML IJ SOLN: 4.0000 mg | Freq: Four times a day (QID) | INTRAMUSCULAR | Status: DC | PRN

## 2021-01-21 MED ORDER — SODIUM CHLORIDE 0.9% IV SOLUTION: Freq: Once | INTRAVENOUS | Status: AC

## 2021-01-21 NOTE — Progress Notes (Signed)
Initial Nutrition Assessment  DOCUMENTATION CODES:   Severe malnutrition in context of chronic illness  INTERVENTION:  - will order Magic Cup TID with meals, each supplement provides 290 kcal and 9 grams of protein. - will order 1 packet Juven BID, each packet provides 95 calories, 2.5 grams of protein (collagen), and 9.8 grams of carbohydrate (3 grams sugar); also contains 7 grams of L-arginine and L-glutamine, 300 mg vitamin C, 15 mg vitamin E, 1.2 mcg vitamin B-12, 9.5 mg zinc, 200 mg calcium, and 1.5 g  Calcium Beta-hydroxy-Beta-methylbutyrate to support wound healing  - monitor for [place] decisions after conversation had with wife.    NUTRITION DIAGNOSIS:   Severe Malnutrition related to chronic illness as evidenced by severe fat depletion, severe muscle depletion.  GOAL:   Patient will meet greater than or equal to 90% of their needs  MONITOR:   PO intake, Supplement acceptance, Labs, Weight trends, Skin, Other (Comment) (GOC)  REASON FOR ASSESSMENT:   Malnutrition Screening Tool  ASSESSMENT:   80 y.o. male with medical history of normal pressures of GERD, COPD, IBS, BPH, PVD, impotence, hydrocephalus, cognitive impairment, iron deficiency anemia, left leg DVT, stroke, indwelling Foley catheter, and decubitus ulcers. He presented to the ED from nursing facility with CBG of 29 mg/dl.  Patient sleeping in bed with no visitors present. Able to talk with RN at bedside who reports patient has been unresponsive throughout shift. No PO given to patient. MD planning to discuss [place] with wife once she arrives. Wife is very hard of hearing.   Patient is known to this RD from hospitalization at the end of August. He has dentures but unsure of if they fit.   Weight today is 144 lb and PTA the most recently documented weight was on 07/06/19. Suspect currently documented weight is inaccurate d/t extent of edema to BLE and RUE.   Labs reviewed; Na: 131 mmol/l, BUN: 27 mg/dl, Ca: 8.1  mg/dl. Medications reviewed; 500 mg ascorbic acid BID, 100 mg colace BID, 325 mg ferrous sulfate/day, 1 mg folvite/day, 100 mg solu-cortef x1 dose 10/12, 75 mcg oral synthroid/day, 1 tablet multivitamin with minerals/day, 40 mg oral protonix/day.    NUTRITION - FOCUSED PHYSICAL EXAM:  Flowsheet Row Most Recent Value  Orbital Region Moderate depletion  Upper Arm Region Severe depletion  Thoracic and Lumbar Region Severe depletion  Buccal Region Severe depletion  Temple Region Moderate depletion  Clavicle Bone Region Severe depletion  Clavicle and Acromion Bone Region Severe depletion  Scapular Bone Region Severe depletion  Dorsal Hand Moderate depletion  Patellar Region Unable to assess  Anterior Thigh Region Unable to assess  Posterior Calf Region Unable to assess  Edema (RD Assessment) Severe  [BLE]  Hair Reviewed  Eyes Unable to assess  Mouth Unable to assess  Skin Reviewed  Nails Reviewed       Diet Order:   Diet Order             DIET - DYS 1 Room service appropriate? Yes with Assist; Fluid consistency: Thin  Diet effective now                   EDUCATION NEEDS:   Not appropriate for education at this time  Skin:  Skin Assessment: Skin Integrity Issues: Skin Integrity Issues:: Unstageable, Stage II Stage II: L hip Unstageable: full thickness to R hip; R buttocks; sacrum; bilateral heels  Last BM:  PTA/unknown  Height:   Ht Readings from Last 1 Encounters:  01/21/21 5'  7" (1.702 m)    Weight:   Wt Readings from Last 1 Encounters:  01/21/21 65.4 kg     Estimated Nutritional Needs:  Kcal:  2200-2400 kcal Protein:  110-125 grams Fluid:  >/= 2.5 L/day     Henry Matin, MS, RD, LDN, CNSC Inpatient Clinical Dietitian RD pager # available in [facility]  After hours/weekend pager # available in [facility]

## 2021-01-21 NOTE — Consult Note (Signed)
[facility] Nurse Consult Note: Remote consult completed with aide of nursing staff and photos placed in chart . Reason for Consult:Unstageable pressure injuries to right trochanter and sacrum  Bilateral heels with dry stable eschar. Left posterior tibial wound noted by bedside RN, Sharyn Lull From SNF and wounds are chronic nonhealing.  Wound type:Unstageable chronic pressure injuries.  Pressure Injury POA: Yes Measurement: Bedside RN To assess  Sacrum:  5 cm x 4 cm  Right hip:  8 cm x 6.5 cm  Right posterior tibia:  4 cm x 3 cm  Bilateral heels are 2 cm dry stable eschar  Wound bed:100% devitalized tissue.  Drainage (amount, consistency, odor) moderate purulence with necrotic odor Periwound: intact Dressing procedure/placement/frequency:Heels paint with betadine daily. Heel protectors Cleanse wounds to right hip, sacrum and left posterior tibia With NS and pat dry. Apply Dakins moist gauze.  Top with dry gauze, kerlix and tape or ABD /tape.  WIll ask PT to consult regarding hydrotherapy.  Prevalon boots Will need low air loss mattress if admitted to medical floor.  Will follow.   Domenic Moras MSN, RN, FNP-BC CWON Wound, Ostomy, Continence Nurse Pager [phone]

## 2021-01-21 NOTE — Progress Notes (Signed)
Spiritual Care Note  Covering [facility], got page from RN for emotional support for Mr Klahn' daughter Caren Griffins. Provided pastoral presence, emotional and logistical support, and prayer at bedside. Caren Griffins was tearful and appreciative of the opportunity to show care for her father.  Family is aware of chaplain availability, should other needs arise.   [place], [place], [facility] M-F daytime pager [phone] [facility] 24/7 pager [phone] Voicemail [phone]

## 2021-01-21 NOTE — Progress Notes (Signed)
Pharmacy Antibiotic Note  Henry Carter is a 80 y.o. male admitted on 01/19/2021 with UTI and decubitis ulcers .  Pharmacy has been consulted for Zosyn and vanc dosing.   Plan: Adjust vanc from 750mg  IV q24 to 1250mg  IV q24 - est AUC 489 (goal 400-550) using SCr 0.91 Continue current Zosyn dosing Daily SCr, monitor renal function closely as combo Vanc/Zosyn has shown to increase AKI risk  Height: 5\' 7"  (170.2 cm) Weight: 65.4 kg (144 lb 2.9 oz) IBW/kg (Calculated) : 66.1  Temp (24hrs), Avg:91.7 F (33.2 C), Min:85.5 F (29.7 C), Max:95 F (35 C)  Recent Labs  Lab 01/31/2021 0620 01/15/2021 0841 01/18/2021 1208 01/19/2021 1753 02/05/2021 2249 01/21/21 0057  WBC 2.1*  --   --   --  2.6* 3.3*  CREATININE 0.99  --   --   --  1.00 0.91  LATICACIDVEN 1.0 0.8 1.3 1.6 2.1* 2.6*     Estimated Creatinine Clearance: 60.9 mL/min (by C-G formula based on SCr of 0.91 mg/dL).    Allergies  Allergen Reactions   Ace Inhibitors     REACTION: angioedema     Adrian Saran, PharmD, BCPS Secure Chat if ?s 01/21/2021 9:02 AM

## 2021-01-21 NOTE — Progress Notes (Signed)
Attempted to call patient's daughter, Caren Griffins, no answer, voicemail left.

## 2021-01-21 NOTE — Progress Notes (Signed)
    OVERNIGHT PROGRESS REPORT  Patient's temp has slowly and steadily improved. He is currently receiving PRBC due to a low lab result.  Have attempted to notify his family due to his continuing lower BPs Attempts to contact the family have not been successful.  Patient is confirmed to be a DNR by Admitting Physician and is so noted.  Additional attempts to contact family member(s) will be conducted    Gershon Cull MSNA MSN [place]

## 2021-01-21 NOTE — Progress Notes (Signed)
    OVERNIGHT PROGRESS REPORT  Notified by RN for continued hypotension. Patient is currently receiving PRBC and being actively warmed.  Was able to speak to the patient's wife who confirms that this patient is a DO NOT RESUSCITATE.  She does confirm that she would like for him to have a short duration pressor which has been started.  I have consulted with the hospitalist on duty and the ICU staff is currently in process of completing new orders       Gershon Cull MSNA MSN [place]

## 2021-01-21 NOTE — Progress Notes (Signed)
Called d/t pt hypothermic, unable to read a temperature.  Pt responds, however nonverbal.  Still doesn't follow commands.  Room warmed and blankets applied.  Temperature recheck unchanged pt to transfer to ICU/SD per [facility], NP.  See Flowsheet for VS.

## 2021-01-21 NOTE — Progress Notes (Signed)
PROGRESS NOTE    Henry Carter  SAY:[id number] DOB: 1940/09/25 DOA: 01/10/2021 PCP: [name], MD    Brief Narrative:  80 year old male with a history of normal pressure hydrocephalus, prior stroke, chronic indwelling Foley catheter, decubitus ulcers, who was not doing well at home and had recently transition to skilled nursing facility.  The patient was brought to the hospital for altered mental status and had profound, persistent hypoglycemia.  He was also noted to be hypothermic and subsequently developed hypotension.  He was found to have likely infected sacral decubitus ulcers and possible UTI as a cause of his sepsis.  He was started on antibiotics, IV fluids and eventually required vasopressors.  Family has been contacted and plans are to discuss further goals of care.   Assessment & Plan:   Principal Problem:   Hypoglycemia Active Problems:   Hyperlipidemia   Essential hypertension   PERIPHERAL VASCULAR DISEASE   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HCC)   Left leg swelling   Acute metabolic encephalopathy   Chronic indwelling Foley catheter   Severe protein-calorie malnutrition (HCC)   Hypothermia   Goals of care, counseling/discussion   Pressure injury of skin   Pleural effusion, left   Severe sepsis with septic shock (HCC)   DNR (do not resuscitate)   Septic shock, possibly due to urinary tract infection versus infected decubitus ulcers. -Patient admitted with hypothermia, hypoglycemia, altered mental status, hypotension.  Lactic acid initially normal on arrival, subsequently trended up. -Blood culture sent on admission, currently in process -Patient started on broad-spectrum intravenous antibiotics -Despite receiving 3 to 4 L of IV fluid, he was persistently hypotensive and started on peripheral Levophed  Acute metabolic encephalopathy -Normally, it appears patient is awake and alert and can carry on conversation -Currently he is completely  unresponsive -Encephalopathy likely related to sepsis and hypoglycemia -At baseline, he has normal pressure hydrocephalus  Severe protein calorie malnutrition -Review of records indicate that he has had a chronic decline over the last several weeks to months, with decreased p.o. intake -Albumin currently 1.8, suspect this is lower since he is probably hemoconcentrated -Already appears to be third spacing IV fluids since he is developing worsening left pleural effusion  Chronic indwelling Foley catheter -Present on admission  Multiple sacral decubitus ulcers, present on admission -Started on intravenous antibiotics -Wound care consult requested  Goals of care discussion -Briefly discussed with patient's wife over the phone that despite aggressive measures, the patient is overall not doing well.  I am concerned that he may not survive this illness.  Patient's wife does not hear very well over the phone and plans to come to the hospital to discuss goals of care further.   DVT prophylaxis: lovenox  Code Status: DNR Family Communication: discussed with patient wife over the phone Disposition Plan: Status is: Inpatient  Remains inpatient appropriate because:Hemodynamically unstable, Altered mental status, Ongoing diagnostic testing needed not appropriate for outpatient work up, IV treatments appropriate due to intensity of illness or inability to take PO, and Inpatient level of care appropriate due to severity of illness  Dispo: The patient is from: SNF              Anticipated d/c is to:  TBD              Patient currently is not medically stable to d/c.   Difficult to place patient No         Consultants:    Procedures:    Antimicrobials:  Vancomycin 10/11> Unasyn  10/11>    Subjective: Patient is unresponsive  Objective: Vitals:   01/21/21 1700 01/21/21 1735 01/21/21 1800 01/21/21 1835  BP:    98/71  Pulse: (!) 110     Resp: 19 (!) 22 (!) 26 (!) 22  Temp: 98.8  F (37.1 C) 99 F (37.2 C) 99 F (37.2 C) 98.8 F (37.1 C)  TempSrc:      SpO2: 97%     Weight:      Height:        Intake/Output Summary (Last 24 hours) at 01/21/2021 1905 Last data filed at 01/21/2021 1900 Gross per 24 hour  Intake 2770.2 ml  Output 380 ml  Net 2390.2 ml   Filed Weights   01/21/21 0400  Weight: 65.4 kg    Examination:  General exam: unresponsive Respiratory system: bilateral rhonchi with diminished breath sounds on left side. Increased work of breathing.  Cardiovascular system: S1 & S2 heard, RRR. No JVD, murmurs, rubs, gallops or clicks.  Gastrointestinal system: Abdomen is nondistended, soft and nontender. No organomegaly or masses felt. Normal bowel sounds heard. Central nervous system: unable to assess. Extremities: anasarca with 2-3+ pitting edema bilaterally Skin: No rashes, lesions or ulcers Psychiatry: unable to assess     Data Reviewed: I have personally reviewed following labs and imaging studies  CBC: Recent Labs  Lab 01/11/2021 0620 02/03/2021 2249 01/21/21 0057 01/21/21 0717  WBC 2.1* 2.6* 3.3*  --   NEUTROABS 1.8  --   --   --   HGB 7.5* 7.0* 6.5* 9.4*  HCT 23.4* 21.7* 20.8* 29.1*  MCV 82.1 84.1 83.5  --   PLT 191 158 148*  --    Basic Metabolic Panel: Recent Labs  Lab 01/28/2021 0620 01/18/2021 2249 01/21/21 0057  NA 138  --  131*  K 4.3  --  3.9  CL 110  --  107  CO2 22  --  19*  GLUCOSE 90  --  121*  BUN 29*  --  27*  CREATININE 0.99 1.00 0.91  CALCIUM 8.8*  --  8.1*   GFR: Estimated Creatinine Clearance: 60.9 mL/min (by C-G formula based on SCr of 0.91 mg/dL). Liver Function Tests: Recent Labs  Lab 02/05/2021 0620  AST 35  ALT 14  ALKPHOS 64  BILITOT 0.3  PROT 4.9*  ALBUMIN 2.2*   Recent Labs  Lab 01/16/2021 0620  LIPASE 40   No results for input(s): AMMONIA in the last 168 hours. Coagulation Profile: No results for input(s): INR, PROTIME in the last 168 hours. Cardiac Enzymes: Recent Labs  Lab  02/06/2021 0620  CKTOTAL 91   BNP (last 3 results) No results for input(s): PROBNP in the last 8760 hours. HbA1C: Recent Labs    02/06/2021 2249  HGBA1C 5.6   CBG: Recent Labs  Lab 02/01/2021 1159 02/04/2021 1342 01/19/2021 1636 01/16/2021 1811 01/28/2021 2052  GLUCAP 115* 107* 123* 116* 135*   Lipid Profile: No results for input(s): CHOL, HDL, LDLCALC, TRIG, CHOLHDL, LDLDIRECT in the last 72 hours. Thyroid Function Tests: Recent Labs    01/21/21 0748  TSH 15.247*   Anemia Panel: No results for input(s): VITAMINB12, FOLATE, FERRITIN, TIBC, IRON, RETICCTPCT in the last 72 hours. Sepsis Labs: Recent Labs  Lab 01/10/2021 1208 02/06/2021 1753 01/30/2021 2249 01/21/21 0057  LATICACIDVEN 1.3 1.6 2.1* 2.6*    Recent Results (from the past 240 hour(s))  Blood culture (routine x 2)     Status: None (Preliminary result)   Collection Time: 01/24/2021  6:00 AM   Specimen: BLOOD  Result Value Ref Range Status   Specimen Description   Final    BLOOD BLOOD RIGHT FOREARM Performed at [facility], [address] [place], [place] [address]    Special Requests   Final    BOTTLES DRAWN AEROBIC AND ANAEROBIC Blood Culture adequate volume Performed at [facility], [address] [place], [place] [address]    Culture   Final    NO GROWTH < 24 HOURS Performed at [facility], [place] [address] [place], [place] [address]    Report Status PENDING  Incomplete  Urine Culture     Status: Abnormal   Collection Time: 02/03/2021  6:30 AM   Specimen: Urine, Catheterized  Result Value Ref Range Status   Specimen Description   Final    URINE, CATHETERIZED Performed at [facility], [address], [place], [place] [address]    Special Requests   Final    NONE Performed at [facility], [address], [place], [place] [address]    Culture >=100,000 COLONIES/mL YEAST (A)  Final   Report Status 01/21/2021 FINAL  Final   Resp Panel by RT-PCR (Flu A&B, Covid) Nasopharyngeal Swab     Status: None   Collection Time: 01/27/2021  8:00 AM   Specimen: Nasopharyngeal Swab; Nasopharyngeal(NP) swabs in vial transport medium  Result Value Ref Range Status   SARS Coronavirus 2 by RT PCR NEGATIVE NEGATIVE Final    Comment: (NOTE) SARS-CoV-2 target nucleic acids are NOT DETECTED.  The SARS-CoV-2 RNA is generally detectable in upper respiratory specimens during the acute phase of infection. The lowest concentration of SARS-CoV-2 viral copies this assay can detect is 138 copies/mL. A negative result does not preclude SARS-Cov-2 infection and should not be used as the sole basis for treatment or other patient management decisions. A negative result may occur with  improper specimen collection/handling, submission of specimen other than nasopharyngeal swab, presence of viral mutation(s) within the areas targeted by this assay, and inadequate number of viral copies(<138 copies/mL). A negative result must be combined with clinical observations, patient history, and epidemiological information. The expected result is Negative.  Fact Sheet for Patients:  EntrepreneurPulse.com.au  Fact Sheet for Healthcare Providers:  IncredibleEmployment.be  This test is no t yet approved or cleared by the [place] FDA and  has been authorized for detection and/or diagnosis of SARS-CoV-2 by FDA under an Emergency Use Authorization (EUA). This EUA will remain  in effect (meaning this test can be used) for the duration of the COVID-19 declaration under Section 564(b)(1) of the Act, 21 U.S.C.section 360bbb-3(b)(1), unless the authorization is terminated  or revoked sooner.       Influenza A by PCR NEGATIVE NEGATIVE Final   Influenza B by PCR NEGATIVE NEGATIVE Final    Comment: (NOTE) The Xpert Xpress SARS-CoV-2/FLU/RSV plus assay is intended as an aid in the diagnosis of influenza from  Nasopharyngeal swab specimens and should not be used as a sole basis for treatment. Nasal washings and aspirates are unacceptable for Xpert Xpress SARS-CoV-2/FLU/RSV testing.  Fact Sheet for Patients: EntrepreneurPulse.com.au  Fact Sheet for Healthcare Providers: IncredibleEmployment.be  This test is not yet approved or cleared by the [place] FDA and has been authorized for detection and/or diagnosis of SARS-CoV-2 by FDA under an Emergency Use Authorization (EUA). This EUA will remain in effect (meaning this test can be used) for the duration of the COVID-19 declaration under Section 564(b)(1) of the  Act, 21 U.S.C. section 360bbb-3(b)(1), unless the authorization is terminated or revoked.  Performed at [facility], [address], [place], [place] [address]   Blood culture (routine x 2)     Status: None (Preliminary result)   Collection Time: 01/19/2021  8:05 AM   Specimen: BLOOD  Result Value Ref Range Status   Specimen Description   Final    BLOOD BLOOD LEFT FOREARM Performed at [facility], [address], [place], [place] [address]    Special Requests   Final    BOTTLES DRAWN AEROBIC AND ANAEROBIC Blood Culture adequate volume Performed at [facility], [address] [place], [place] [address]    Culture   Final    NO GROWTH < 24 HOURS Performed at [facility], [place] [address], [place], [place] [address]    Report Status PENDING  Incomplete  MRSA Next Gen by PCR, Nasal     Status: None   Collection Time: 01/24/2021 11:04 PM   Specimen: Nasal Mucosa; Nasal Swab  Result Value Ref Range Status   MRSA by PCR Next Gen NOT DETECTED NOT DETECTED Final    Comment: (NOTE) The GeneXpert MRSA Assay (FDA approved for NASAL specimens only), is one component of a comprehensive MRSA colonization surveillance program. It is not intended to diagnose MRSA infection nor to  guide or monitor treatment for MRSA infections. Test performance is not FDA approved in patients less than 100 years old. Performed at [facility], [place] [address], [place], [place] [address]          Radiology Studies: CT Head Wo Contrast  Result Date: 01/25/2021 CLINICAL DATA:  Altered mental status. EXAM: CT HEAD WITHOUT CONTRAST TECHNIQUE: Contiguous axial images were obtained from the base of the skull through the vertex without intravenous contrast. COMPARISON:  Head CT 12/29/2020 FINDINGS: Brain: Stable ventriculomegaly out of proportion to the degree of cerebral atrophy. Stable age related periventricular white matter disease. No CT findings for acute hemispheric infarction or intracranial hemorrhage. No mass lesions. The brainstem and cerebellum are grossly normal. Vascular: Stable vascular calcifications. No aneurysm or hyperdense vessels. Skull: No skull fracture or bone lesions. Sinuses/Orbits: The paranasal sinuses and mastoid air cells are clear. The globes are intact. Remote blow in type fracture involving the right orbit is noted. Other: No scalp lesions or scalp hematoma. IMPRESSION: 1. Stable ventriculomegaly out of proportion to the degree of cerebral atrophy. 2. Stable age related periventricular white matter disease. 3. No acute intracranial findings or mass lesions. Electronically Signed   By: Marijo Sanes M.D.   On: 01/21/2021 07:50   CT ABDOMEN PELVIS W CONTRAST  Result Date: 02/07/2021 CLINICAL DATA:  Abdominal pain. EXAM: CT ABDOMEN AND PELVIS WITH CONTRAST TECHNIQUE: Multidetector CT imaging of the abdomen and pelvis was performed using the standard protocol following bolus administration of intravenous contrast. CONTRAST:  82mL OMNIPAQUE IOHEXOL 300 MG/ML  SOLN COMPARISON:  01/07/2021 FINDINGS: Lower chest: Bilateral pleural effusions, left larger than right with overlying atelectasis, left greater than right. The heart is within normal limits in  size. No pericardial effusion. Stable aortic and coronary artery calcifications. Hepatobiliary: No hepatic lesions or intrahepatic biliary dilatation. The gallbladder is grossly normal. Pancreas: Marked pancreatic atrophy. No mass, inflammation or ductal dilatation. Spleen: Normal size. No focal lesions. Adrenals/Urinary Tract: The adrenal glands and kidneys appear stable. Bilateral renal cysts are noted. The bladder contains a Foley catheter. Diffuse bladder wall thickening is noted. Enlarged prostate gland with  median lobe hypertrophy impressing on the base of the bladder. Stomach/Bowel: The stomach, duodenum, small bowel and colon are grossly normal. No findings for obstructive process. Vascular/Lymphatic: Severe/advanced atherosclerotic calcifications involving the aorta, iliac arteries and branch vessels. Stable 4.3 x 4.2 cm infrarenal abdominal aortic aneurysm extending right down to the bifurcation. Recommend follow-up every 12 months and vascular consultation. This recommendation follows ACR consensus guidelines: White Paper of the ACR Incidental Findings Committee II on Vascular Findings. J Am Coll Radiol 2013; 10:789-794. No abdominal or pelvic lymphadenopathy is identified. There is a IVC filter noted. Reproductive: Stable enlarged prostate gland with median lobe hypertrophy impressing on the base of the bladder. The seminal vesicles are unremarkable. Other: Diffuse mesenteric edema no overt ascites. Diffuse body wall edema. Musculoskeletal: Stable advanced degenerative changes involving the lumbar spine. No bone lesions or fractures. IMPRESSION: 1. Bilateral pleural effusions, left larger than right with overlying atelectasis, left greater than right. 2. Stable 4.3 x 4.2 cm infrarenal abdominal aortic aneurysm. 3. Stable enlarged prostate gland with median lobe hypertrophy impressing on the base of the bladder. 4. Diffuse bladder wall thickening could be due to chronic bladder outlet obstruction. 5.  Diffuse mesenteric edema and body wall edema but no overt ascites. Aortic Atherosclerosis (ICD10-I70.0). Electronically Signed   By: Marijo Sanes M.D.   On: 01/22/2021 08:00   DG CHEST PORT 1 VIEW  Result Date: 01/21/2021 CLINICAL DATA:  Pleural effusion EXAM: PORTABLE CHEST 1 VIEW COMPARISON:  01/15/2021 FINDINGS: There is a layering, moderate size left pleural effusion, with adjacent left basilar consolidation. Leftward mediastinal shift. No visible pneumothorax. Thoracic spondylosis. No acute osseous abnormality. Right glenohumeral arthritis IMPRESSION: Unchanged moderate size left pleural effusion with adjacent left basilar atelectasis. Electronically Signed   By: Maurine Simmering M.D.   On: 01/21/2021 07:54   DG Chest Portable 1 View  Result Date: 01/30/2021 CLINICAL DATA:  Hypoglycemia. Altered mental status. Combative. EXAM: PORTABLE CHEST 1 VIEW COMPARISON:  12/29/2020 FINDINGS: The cardiac silhouette, mediastinal and hilar contours are within normal limits. There is tortuosity and calcification of the thoracic aorta. Persistent left lower lobe process likely a combination effusion and atelectasis or infiltrate. The right lung is clear. IMPRESSION: Persistent left lower lobe process, likely a combination pleural effusion and atelectasis or infiltrate. Electronically Signed   By: Marijo Sanes M.D.   On: 02/07/2021 06:40        Scheduled Meds:  chlorhexidine  15 mL Mouth Rinse BID   Chlorhexidine Gluconate Cloth  6 each Topical Daily   collagenase   Topical Daily   mouth rinse  15 mL Mouth Rinse q12n4p   Continuous Infusions:  sodium chloride 10 mL/hr at 01/21/21 1735     LOS: 1 day    Critical care procedure note Authorized and performed by: Kathie Dike Total critical care time: Approximately 35 minutes Due to high probability of clinically significant, life-threatening deterioration, the patient required my highest level of preparedness to intervene emergently and I personally  spent this critical care time directly and personally managing the patient.  The critical care time included obtaining a history, examining the patient, pulse oximetry, ordering and review of studies, arranging urgent treatment with development of a management plan, evaluation of patient's response to treatment, frequent reassessment, discussions with other providers.  Critical care time was performed to assess and manage the high probability of imminent, life-threatening deterioration that could result in multiorgan failure.  It was exclusive of separate billable procedures and treating other patients and teaching time.  Please see MDM section and the rest of the of note for further information on patient assessment and treatment     Kathie Dike, MD [facility]   If 7PM-7AM, please contact night-coverage [email or web address]  01/21/2021, 7:05 PM

## 2021-01-21 NOTE — Progress Notes (Signed)
Met at the bedside with patient's wife and we discussed his current state.  We reviewed his overall decline over the last several weeks to months.  During his last admission, he was seen by palliative care and had reported that he would want to be DNR.  At that time, palliative care/hospice was recommended to follow the patient after discharge.  His wife does recognize that he is critically ill and that he may not survive this hospitalization.  Currently, she wishes to focus his care on his quality of life and comfort which appears to be very reasonable.  At her request, I also reached out to patient's daughter, Henry Carter and updated her on the patient's critical condition.  She agrees with the plan to transition to comfort measures I will be in to visit the patient later today.  Once vasopressors are turned off, I would anticipate in-hospital death.  Prognosis would be hours to days.  Time spent: 35 mins

## 2021-01-21 NOTE — Progress Notes (Signed)
Patient's daughter, Caren Griffins, at bedside, states "you can turn off his medication now" (referring to levophed). Per prior conversation (between MD, wife, and Caren Griffins), patient will be transitioning to comfort measures. Patient's wife, [name], visited and left, with no plans to return today. Patient CPOT 0 and in no apparent distress. PRN medications available for patient's comfort.

## 2021-01-22 DIAGNOSIS — G9341 Metabolic encephalopathy: Secondary | ICD-10-CM | POA: Diagnosis not present

## 2021-01-22 DIAGNOSIS — Z978 Presence of other specified devices: Secondary | ICD-10-CM | POA: Diagnosis not present

## 2021-01-22 DIAGNOSIS — Z66 Do not resuscitate: Secondary | ICD-10-CM | POA: Diagnosis not present

## 2021-01-22 DIAGNOSIS — E162 Hypoglycemia, unspecified: Secondary | ICD-10-CM | POA: Diagnosis not present

## 2021-01-22 LAB — TYPE AND SCREEN
ABO/RH(D): O POS
Antibody Screen: NEGATIVE
Unit division: 0

## 2021-01-22 LAB — BPAM RBC
Blood Product Expiration Date: 202210142359
ISSUE DATE / TIME: 202210120331
Unit Type and Rh: 5100

## 2021-01-22 MED ORDER — COLLAGENASE 250 UNIT/GM EX OINT
1.0000 "application " | TOPICAL_OINTMENT | Freq: Every day | CUTANEOUS | Status: DC
  Filled 2021-01-22: qty 30

## 2021-01-22 NOTE — Progress Notes (Signed)
Chaplain spent time with Mrs. Coval at [facility] bedside.  Chaplain was able to learn about Mrs. William's concerns and questions concerning her husband, as well as the tumultuous family dynamics surrounding them all.  She had shared with family that Bard had passed already because she believes that is what she had been told.  Because of that, it created some confusion for her and the rest of his family.  Mrs. Beverlin has been working to be there for Laydon and begin to process him no longer being here physically.  She had come to a place of accepting Jaiveer's death because they had already voiced to each other what they wanted or needed during this time.  Chaplain spent some time talking with her about waiting for a nurse or physician to declare him dead and how that process does not look the same for everybody.  Mrs. Pidcock voiced that she may not be here when he passes and Chaplain offered assurance that he would not be alone and that either a nurse or chaplain could provide presence with him.     Mrs. Prestwood is aware of what funeral home she would like to utilize for [place].  Chaplain let her know to give that to the nurse when it is time.  Chaplain worked to affirm Mrs. Deroos taking her time and taking it moment by moment as Chaplain could assess she was anxious and had a number of emotions happening at once.   Chaplain offered support, listening and presence.    01/22/21 1400  Clinical Encounter Type  Visited With Patient and family together  Visit Type Spiritual support;Initial  Spiritual Encounters  Spiritual Needs Emotional;Grief support;Prayer

## 2021-01-22 NOTE — Progress Notes (Signed)
Wife at bedside and updated. Pt resting comfortably.

## 2021-01-22 NOTE — Consult Note (Signed)
WOC follow-up: Refer to consult note from yesterday. Topical treatment orders were provided at that time for the bedside nurses to apply Dakins moistened gauze and for physical therapy to perform hydrotherapy.  Pt is now with comfort care goals, so aggressive plan of care will not be pursued. Revised dressing change orders for nurses to perform as follows: 1. Heels paint with betadine daily. Heel protectors 2. Cleanse wounds to right hip, sacrum and left posterior tibia With NS and pat dry. Apply Santyl and moist gauze.  Top with dry gauze, kerlix and tape or ABD /tape. Please re-consult if further assistance is needed.  Thank-you,  Julien Girt MSN, [place], [place], [place], [place]

## 2021-01-22 NOTE — Progress Notes (Signed)
Spoke to daughter Caren Griffins and updated

## 2021-01-22 NOTE — Progress Notes (Signed)
PROGRESS NOTE    Henry Carter  ION:[id number] DOB: 22-May-1940 DOA: 01/18/2021 PCP: [name], MD    Brief Narrative:  80 year old male with a history of normal pressure hydrocephalus, prior stroke, chronic indwelling Foley catheter, decubitus ulcers, who was not doing well at home and had recently transition to skilled nursing facility.  The patient was brought to the hospital for altered mental status and had profound, persistent hypoglycemia.  He was also noted to be hypothermic and subsequently developed hypotension.  He was found to have likely infected sacral decubitus ulcers and possible UTI as a cause of his sepsis.  He was started on antibiotics, IV fluids and eventually required vasopressors.  After discussion with patient's wife and daughter, decision made to transition towards comfort care.  Prognosis is poor, anticipate in-hospital death.   Assessment & Plan:   Principal Problem:   Hypoglycemia Active Problems:   Hyperlipidemia   Essential hypertension   PERIPHERAL VASCULAR DISEASE   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HCC)   Left leg swelling   Acute metabolic encephalopathy   Chronic indwelling Foley catheter   Severe protein-calorie malnutrition (HCC)   Hypothermia   Goals of care, counseling/discussion   Pressure injury of skin   Pleural effusion, left   Severe sepsis with septic shock (HCC)   DNR (do not resuscitate)   Septic shock, possibly due to urinary tract infection versus infected decubitus ulcers. -Patient admitted with hypothermia, hypoglycemia, altered mental status, hypotension.  Lactic acid initially normal on arrival, subsequently trended up. -Blood culture sent on admission, no growth as of yet -Patient was started on broad-spectrum intravenous antibiotics -Despite receiving 3 to 4 L of IV fluid, he was persistently hypotensive and started on peripheral Levophed -Overall care de-escalated based on goals of care discussion, see  below.  Acute metabolic encephalopathy -Normally, it appears patient is awake and alert and can carry on conversation -Currently he is completely unresponsive -Encephalopathy likely related to sepsis and hypoglycemia -At baseline, he has normal pressure hydrocephalus  Severe protein calorie malnutrition -Review of records indicate that he has had a chronic decline over the last several weeks to months, with decreased p.o. intake -Albumin currently 1.8, suspect this is lower since he is probably hemoconcentrated -Already appears to be third spacing IV fluids since he is developing worsening left pleural effusion  Chronic indwelling Foley catheter -Present on admission  Multiple sacral decubitus ulcers, present on admission -Initially treated with intravenous antibiotics, seen by wound care  Goals of care discussion -After discussing goals of care with patient's wife and daughter, decision was made to transition to comfort measures -With his ongoing hypotension, and mental status changes, anticipate prognosis to be hours to days.  Anticipate in-hospital death.   DVT prophylaxis: none  Code Status: DNR, comfort measures Family Communication: No family present today, unable to reach his wife or his daughter over the phone Disposition Plan: Status is: Inpatient  Remains inpatient appropriate because:Hemodynamically unstable, Altered mental status, Ongoing diagnostic testing needed not appropriate for outpatient work up, IV treatments appropriate due to intensity of illness or inability to take PO, and Inpatient level of care appropriate due to severity of illness  Dispo: The patient is from: SNF              Anticipated d/c is to:  TBD              Patient currently is not medically stable to d/c.   Difficult to place patient No  Consultants:    Procedures:    Antimicrobials:  Vancomycin 10/11> 10/12 Unasyn 10/11> 10/12   Subjective: Patient remains  unresponsive  Objective: Vitals:   01/22/21 0700 01/22/21 1040 01/22/21 1401 01/22/21 1814  BP:  (!) 66/50    Pulse: 95 (!) 47    Resp: 14 14 14  (!) 6  Temp: 97.7 F (36.5 C) 97.7 F (36.5 C)    TempSrc:  Oral    SpO2: 96% (!) 87%    Weight:      Height:        Intake/Output Summary (Last 24 hours) at 01/22/2021 2004 Last data filed at 01/22/2021 1500 Gross per 24 hour  Intake 151.88 ml  Output 60 ml  Net 91.88 ml   Filed Weights   01/21/21 0400  Weight: 65.4 kg    Examination:  General exam: Unresponsive Respiratory system: Scattered rhonchi bilaterally Cardiovascular system:RRR. No murmurs, rubs, gallops.     Data Reviewed: I have personally reviewed following labs and imaging studies  CBC: Recent Labs  Lab 02/07/2021 0620 02/02/2021 2249 01/21/21 0057 01/21/21 0717  WBC 2.1* 2.6* 3.3*  --   NEUTROABS 1.8  --   --   --   HGB 7.5* 7.0* 6.5* 9.4*  HCT 23.4* 21.7* 20.8* 29.1*  MCV 82.1 84.1 83.5  --   PLT 191 158 148*  --    Basic Metabolic Panel: Recent Labs  Lab 01/16/2021 0620 01/11/2021 2249 01/21/21 0057  NA 138  --  131*  K 4.3  --  3.9  CL 110  --  107  CO2 22  --  19*  GLUCOSE 90  --  121*  BUN 29*  --  27*  CREATININE 0.99 1.00 0.91  CALCIUM 8.8*  --  8.1*   GFR: Estimated Creatinine Clearance: 60.9 mL/min (by C-G formula based on SCr of 0.91 mg/dL). Liver Function Tests: Recent Labs  Lab 01/25/2021 0620  AST 35  ALT 14  ALKPHOS 64  BILITOT 0.3  PROT 4.9*  ALBUMIN 2.2*   Recent Labs  Lab 01/26/2021 0620  LIPASE 40   No results for input(s): AMMONIA in the last 168 hours. Coagulation Profile: No results for input(s): INR, PROTIME in the last 168 hours. Cardiac Enzymes: Recent Labs  Lab 01/14/2021 0620  CKTOTAL 91   BNP (last 3 results) No results for input(s): PROBNP in the last 8760 hours. HbA1C: Recent Labs    01/14/2021 2249  HGBA1C 5.6   CBG: Recent Labs  Lab 01/22/2021 1159 01/12/2021 1342 01/22/2021 1636  01/15/2021 1811 02/03/2021 2052  GLUCAP 115* 107* 123* 116* 135*   Lipid Profile: No results for input(s): CHOL, HDL, LDLCALC, TRIG, CHOLHDL, LDLDIRECT in the last 72 hours. Thyroid Function Tests: Recent Labs    01/21/21 0748  TSH 15.247*   Anemia Panel: No results for input(s): VITAMINB12, FOLATE, FERRITIN, TIBC, IRON, RETICCTPCT in the last 72 hours. Sepsis Labs: Recent Labs  Lab 01/21/2021 1208 02/08/2021 1753 01/10/2021 2249 01/21/21 0057  LATICACIDVEN 1.3 1.6 2.1* 2.6*    Recent Results (from the past 240 hour(s))  Blood culture (routine x 2)     Status: None (Preliminary result)   Collection Time: 01/19/2021  6:00 AM   Specimen: BLOOD  Result Value Ref Range Status   Specimen Description   Final    BLOOD BLOOD RIGHT FOREARM Performed at [facility], [address], [place], [place] [address]    Special Requests   Final    BOTTLES DRAWN AEROBIC  AND ANAEROBIC Blood Culture adequate volume Performed at [facility], [address] [place], [place] [address]    Culture   Final    NO GROWTH 2 DAYS Performed at [facility], [place] [address] [place], [place] [address]    Report Status PENDING  Incomplete  Urine Culture     Status: Abnormal   Collection Time: 01/27/2021  6:30 AM   Specimen: Urine, Catheterized  Result Value Ref Range Status   Specimen Description   Final    URINE, CATHETERIZED Performed at [facility], [address], [place], [place] [address]    Special Requests   Final    NONE Performed at [facility], [address] [place], [place] [address]    Culture >=100,000 COLONIES/mL YEAST (A)  Final   Report Status 01/21/2021 FINAL  Final  Resp Panel by RT-PCR (Flu A&B, Covid) Nasopharyngeal Swab     Status: None   Collection Time: 01/19/2021  8:00 AM   Specimen: Nasopharyngeal Swab; Nasopharyngeal(NP) swabs in vial transport medium  Result Value Ref Range Status   SARS  Coronavirus 2 by RT PCR NEGATIVE NEGATIVE Final    Comment: (NOTE) SARS-CoV-2 target nucleic acids are NOT DETECTED.  The SARS-CoV-2 RNA is generally detectable in upper respiratory specimens during the acute phase of infection. The lowest concentration of SARS-CoV-2 viral copies this assay can detect is 138 copies/mL. A negative result does not preclude SARS-Cov-2 infection and should not be used as the sole basis for treatment or other patient management decisions. A negative result may occur with  improper specimen collection/handling, submission of specimen other than nasopharyngeal swab, presence of viral mutation(s) within the areas targeted by this assay, and inadequate number of viral copies(<138 copies/mL). A negative result must be combined with clinical observations, patient history, and epidemiological information. The expected result is Negative.  Fact Sheet for Patients:  EntrepreneurPulse.com.au  Fact Sheet for Healthcare Providers:  IncredibleEmployment.be  This test is no t yet approved or cleared by the [place] FDA and  has been authorized for detection and/or diagnosis of SARS-CoV-2 by FDA under an Emergency Use Authorization (EUA). This EUA will remain  in effect (meaning this test can be used) for the duration of the COVID-19 declaration under Section 564(b)(1) of the Act, 21 U.S.C.section 360bbb-3(b)(1), unless the authorization is terminated  or revoked sooner.       Influenza A by PCR NEGATIVE NEGATIVE Final   Influenza B by PCR NEGATIVE NEGATIVE Final    Comment: (NOTE) The Xpert Xpress SARS-CoV-2/FLU/RSV plus assay is intended as an aid in the diagnosis of influenza from Nasopharyngeal swab specimens and should not be used as a sole basis for treatment. Nasal washings and aspirates are unacceptable for Xpert Xpress SARS-CoV-2/FLU/RSV testing.  Fact Sheet for  Patients: EntrepreneurPulse.com.au  Fact Sheet for Healthcare Providers: IncredibleEmployment.be  This test is not yet approved or cleared by the [place] FDA and has been authorized for detection and/or diagnosis of SARS-CoV-2 by FDA under an Emergency Use Authorization (EUA). This EUA will remain in effect (meaning this test can be used) for the duration of the COVID-19 declaration under Section 564(b)(1) of the Act, 21 U.S.C. section 360bbb-3(b)(1), unless the authorization is terminated or revoked.  Performed at [facility], [address] [place], [place] [address]   Blood culture (routine x 2)     Status: None (Preliminary result)   Collection Time: 01/10/2021  8:05 AM   Specimen: BLOOD  Result Value Ref Range Status   Specimen Description   Final    BLOOD BLOOD LEFT FOREARM Performed at [facility], [address] [place], [place] [address]    Special Requests   Final    BOTTLES DRAWN AEROBIC AND ANAEROBIC Blood Culture adequate volume Performed at [facility], [address], [place], [place] [address]    Culture   Final    NO GROWTH 2 DAYS Performed at [facility], [place] [address] [place], [place] [address]    Report Status PENDING  Incomplete  MRSA Next Gen by PCR, Nasal     Status: None   Collection Time: 02/04/2021 11:04 PM   Specimen: Nasal Mucosa; Nasal Swab  Result Value Ref Range Status   MRSA by PCR Next Gen NOT DETECTED NOT DETECTED Final    Comment: (NOTE) The GeneXpert MRSA Assay (FDA approved for NASAL specimens only), is one component of a comprehensive MRSA colonization surveillance program. It is not intended to diagnose MRSA infection nor to guide or monitor treatment for MRSA infections. Test performance is not FDA approved in patients less than 16 years old. Performed at [facility], [place] [address] [place],   [address]          Radiology Studies: DG CHEST PORT 1 VIEW  Result Date: 01/21/2021 CLINICAL DATA:  Pleural effusion EXAM: PORTABLE CHEST 1 VIEW COMPARISON:  02/03/2021 FINDINGS: There is a layering, moderate size left pleural effusion, with adjacent left basilar consolidation. Leftward mediastinal shift. No visible pneumothorax. Thoracic spondylosis. No acute osseous abnormality. Right glenohumeral arthritis IMPRESSION: Unchanged moderate size left pleural effusion with adjacent left basilar atelectasis. Electronically Signed   By: Maurine Simmering M.D.   On: 01/21/2021 07:54        Scheduled Meds:  collagenase  1 application Topical Daily   Continuous Infusions:  sodium chloride 10 mL/hr at 01/22/21 0700     LOS: 2 days    Time spent: 15 minutes    Kathie Dike, MD [facility]   If 7PM-7AM, please contact night-coverage [email or web address]  01/22/2021, 8:04 PM

## 2021-01-22 NOTE — Progress Notes (Signed)
    OVERNIGHT PROGRESS REPORT  Changed Level of Care and added Comfort Measures Only order to correspond with Dr Blythe Stanford notation of intent of Family and corresponding de-escalation of care.    Gershon Cull MSNA MSN ACNPC-AG Acute Care Nurse Practitioner [place]

## 2021-01-25 LAB — CULTURE, BLOOD (ROUTINE X 2)
Culture: NO GROWTH
Culture: NO GROWTH
Special Requests: ADEQUATE
Special Requests: ADEQUATE

## 2021-02-03 DIAGNOSIS — R531 Weakness: Secondary | ICD-10-CM | POA: Diagnosis not present

## 2021-02-03 DIAGNOSIS — N179 Acute kidney failure, unspecified: Secondary | ICD-10-CM | POA: Diagnosis not present

## 2021-02-03 DIAGNOSIS — N138 Other obstructive and reflux uropathy: Secondary | ICD-10-CM | POA: Diagnosis not present

## 2021-02-03 DIAGNOSIS — A419 Sepsis, unspecified organism: Secondary | ICD-10-CM | POA: Diagnosis not present

## 2021-02-03 DIAGNOSIS — E43 Unspecified severe protein-calorie malnutrition: Secondary | ICD-10-CM | POA: Diagnosis not present

## 2021-02-10 NOTE — Death Summary Note (Signed)
DEATH SUMMARY   Patient Details  Name: Henry Carter MRN: [id number] DOB: [birth year]  Admission/Discharge Information   Admit Date:  02/18/21  Date of Death: Date of Death: 21-Feb-2021  Time of Death: Time of Death: 67  Length of Stay: 3  Referring Physician: [name], MD   Reason(s) for Hospitalization  Acute metabolic encephalopathy due to severe hypoglycemia  Diagnoses  Preliminary cause of death:   Septic shock Secondary Diagnoses (including complications and co-morbidities):  Principal Problem:   Hypoglycemia Active Problems:   Hyperlipidemia   Essential hypertension   PERIPHERAL VASCULAR DISEASE   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HCC)   Left leg swelling   Acute metabolic encephalopathy   Chronic indwelling Foley catheter   Severe protein-calorie malnutrition (HCC)   Hypothermia   Goals of care, counseling/discussion   Pressure injury of skin   Pleural effusion, left   Severe sepsis with septic shock ([place])   DNR (do not resuscitate)   Brief Hospital Course (including significant findings, care, treatment, and services provided and events leading to death)  Henry Carter is a 80 y.o. year old male with a history of normal pressure hydrocephalus, prior stroke, chronic indwelling Foley catheter, multiple decubitus ulcers, who is not doing well at home and had recently transition to a skilled nursing facility.  Patient was brought to the hospital from skilled nursing facility for altered mental status and was found to have profound, persistent hypoglycemia.  He was also noted to be hypothermic and subsequently developed hypotension.  He received aggressive IV fluids per sepsis protocol, and was started on intravenous antibiotics for sepsis.  Despite these measures, he has had continued hypotension and worsening in his mental status.  He was started on vasopressors, but despite these interventions, he continued to be hypotensive and unresponsive.  It  appears that he has had a decline over the last several weeks to months.  Patient was noted to have severe protein calorie malnutrition.  He had been seen by palliative care on his last admission and was made DNR.  Recommendations at that time for hospice, the patient did not appear to be ready at that time to pursue palliative/hospice.  After extensive discussions with the patient's wife and his daughter regarding his multiple comorbidities, current state of health and expected prognosis, decision was made to transition the patient to comfort measures.  Vasopressors were discontinued at that point and direction of care was focused on quality of life.  Patient was monitored in the hospital and passed away on 02/22/23.  Family was made aware and provided support.    Pertinent Labs and Studies  Significant Diagnostic Studies CT Head Wo Contrast  Result Date: 02/18/2021 CLINICAL DATA:  Altered mental status. EXAM: CT HEAD WITHOUT CONTRAST TECHNIQUE: Contiguous axial images were obtained from the base of the skull through the vertex without intravenous contrast. COMPARISON:  Head CT 12/29/2020 FINDINGS: Brain: Stable ventriculomegaly out of proportion to the degree of cerebral atrophy. Stable age related periventricular white matter disease. No CT findings for acute hemispheric infarction or intracranial hemorrhage. No mass lesions. The brainstem and cerebellum are grossly normal. Vascular: Stable vascular calcifications. No aneurysm or hyperdense vessels. Skull: No skull fracture or bone lesions. Sinuses/Orbits: The paranasal sinuses and mastoid air cells are clear. The globes are intact. Remote blow in type fracture involving the right orbit is noted. Other: No scalp lesions or scalp hematoma. IMPRESSION: 1. Stable ventriculomegaly out of proportion to the degree of cerebral atrophy. 2. Stable age related periventricular white  matter disease. 3. No acute intracranial findings or mass lesions. Electronically  Signed   By: Marijo Sanes M.D.   On: 01/18/2021 07:50   CT ABDOMEN PELVIS W CONTRAST  Result Date: 02/05/2021 CLINICAL DATA:  Abdominal pain. EXAM: CT ABDOMEN AND PELVIS WITH CONTRAST TECHNIQUE: Multidetector CT imaging of the abdomen and pelvis was performed using the standard protocol following bolus administration of intravenous contrast. CONTRAST:  24mL OMNIPAQUE IOHEXOL 300 MG/ML  SOLN COMPARISON:  01/07/2021 FINDINGS: Lower chest: Bilateral pleural effusions, left larger than right with overlying atelectasis, left greater than right. The heart is within normal limits in size. No pericardial effusion. Stable aortic and coronary artery calcifications. Hepatobiliary: No hepatic lesions or intrahepatic biliary dilatation. The gallbladder is grossly normal. Pancreas: Marked pancreatic atrophy. No mass, inflammation or ductal dilatation. Spleen: Normal size. No focal lesions. Adrenals/Urinary Tract: The adrenal glands and kidneys appear stable. Bilateral renal cysts are noted. The bladder contains a Foley catheter. Diffuse bladder wall thickening is noted. Enlarged prostate gland with median lobe hypertrophy impressing on the base of the bladder. Stomach/Bowel: The stomach, duodenum, small bowel and colon are grossly normal. No findings for obstructive process. Vascular/Lymphatic: Severe/advanced atherosclerotic calcifications involving the aorta, iliac arteries and branch vessels. Stable 4.3 x 4.2 cm infrarenal abdominal aortic aneurysm extending right down to the bifurcation. Recommend follow-up every 12 months and vascular consultation. This recommendation follows ACR consensus guidelines: White Paper of the ACR Incidental Findings Committee II on Vascular Findings. J Am Coll Radiol 2013; 10:789-794. No abdominal or pelvic lymphadenopathy is identified. There is a IVC filter noted. Reproductive: Stable enlarged prostate gland with median lobe hypertrophy impressing on the base of the bladder. The seminal  vesicles are unremarkable. Other: Diffuse mesenteric edema no overt ascites. Diffuse body wall edema. Musculoskeletal: Stable advanced degenerative changes involving the lumbar spine. No bone lesions or fractures. IMPRESSION: 1. Bilateral pleural effusions, left larger than right with overlying atelectasis, left greater than right. 2. Stable 4.3 x 4.2 cm infrarenal abdominal aortic aneurysm. 3. Stable enlarged prostate gland with median lobe hypertrophy impressing on the base of the bladder. 4. Diffuse bladder wall thickening could be due to chronic bladder outlet obstruction. 5. Diffuse mesenteric edema and body wall edema but no overt ascites. Aortic Atherosclerosis (ICD10-I70.0). Electronically Signed   By: Marijo Sanes M.D.   On: 02/05/2021 08:00   DG CHEST PORT 1 VIEW  Result Date: 01/21/2021 CLINICAL DATA:  Pleural effusion EXAM: PORTABLE CHEST 1 VIEW COMPARISON:  01/31/2021 FINDINGS: There is a layering, moderate size left pleural effusion, with adjacent left basilar consolidation. Leftward mediastinal shift. No visible pneumothorax. Thoracic spondylosis. No acute osseous abnormality. Right glenohumeral arthritis IMPRESSION: Unchanged moderate size left pleural effusion with adjacent left basilar atelectasis. Electronically Signed   By: Maurine Simmering M.D.   On: 01/21/2021 07:54   DG Chest Portable 1 View  Result Date: 01/31/2021 CLINICAL DATA:  Hypoglycemia. Altered mental status. Combative. EXAM: PORTABLE CHEST 1 VIEW COMPARISON:  12/29/2020 FINDINGS: The cardiac silhouette, mediastinal and hilar contours are within normal limits. There is tortuosity and calcification of the thoracic aorta. Persistent left lower lobe process likely a combination effusion and atelectasis or infiltrate. The right lung is clear. IMPRESSION: Persistent left lower lobe process, likely a combination pleural effusion and atelectasis or infiltrate. Electronically Signed   By: Marijo Sanes M.D.   On: 01/30/2021 06:40     Microbiology Recent Results (from the past 240 hour(s))  Blood culture (routine x 2)  Status: None (Preliminary result)   Collection Time: 01/17/2021  6:00 AM   Specimen: BLOOD  Result Value Ref Range Status   Specimen Description   Final    BLOOD BLOOD RIGHT FOREARM Performed at [facility], [address], [place], [place] [address]    Special Requests   Final    BOTTLES DRAWN AEROBIC AND ANAEROBIC Blood Culture adequate volume Performed at [facility], [address], [place], [place] [address]    Culture   Final    NO GROWTH 3 DAYS Performed at [facility], [place] [address] [place], [place] [address]    Report Status PENDING  Incomplete  Urine Culture     Status: Abnormal   Collection Time: 01/19/2021  6:30 AM   Specimen: Urine, Catheterized  Result Value Ref Range Status   Specimen Description   Final    URINE, CATHETERIZED Performed at [facility], [address], [place], [place] [address]    Special Requests   Final    NONE Performed at [facility], [address], [place],  [address]    Culture >=100,000 COLONIES/mL YEAST (A)  Final   Report Status 01/21/2021 FINAL  Final  Resp Panel by RT-PCR (Flu A&B, Covid) Nasopharyngeal Swab     Status: None   Collection Time: 01/30/2021  8:00 AM   Specimen: Nasopharyngeal Swab; Nasopharyngeal(NP) swabs in vial transport medium  Result Value Ref Range Status   SARS Coronavirus 2 by RT PCR NEGATIVE NEGATIVE Final    Comment: (NOTE) SARS-CoV-2 target nucleic acids are NOT DETECTED.  The SARS-CoV-2 RNA is generally detectable in upper respiratory specimens during the acute phase of infection. The lowest concentration of SARS-CoV-2 viral copies this assay can detect is 138 copies/mL. A negative result does not preclude SARS-Cov-2 infection and should not be used as the sole basis for treatment or other patient management decisions.  A negative result may occur with  improper specimen collection/handling, submission of specimen other than nasopharyngeal swab, presence of viral mutation(s) within the areas targeted by this assay, and inadequate number of viral copies(<138 copies/mL). A negative result must be combined with clinical observations, patient history, and epidemiological information. The expected result is Negative.  Fact Sheet for Patients:  EntrepreneurPulse.com.au  Fact Sheet for Healthcare Providers:  IncredibleEmployment.be  This test is no t yet approved or cleared by the [place] FDA and  has been authorized for detection and/or diagnosis of SARS-CoV-2 by FDA under an Emergency Use Authorization (EUA). This EUA will remain  in effect (meaning this test can be used) for the duration of the COVID-19 declaration under Section 564(b)(1) of the Act, 21 U.S.C.section 360bbb-3(b)(1), unless the authorization is terminated  or revoked sooner.       Influenza A by PCR NEGATIVE NEGATIVE Final   Influenza B by PCR NEGATIVE NEGATIVE Final    Comment: (NOTE) The Xpert Xpress SARS-CoV-2/FLU/RSV plus assay is intended as an aid in the diagnosis of influenza from Nasopharyngeal swab specimens and should not be used as a sole basis for treatment. Nasal washings and aspirates are unacceptable for Xpert Xpress SARS-CoV-2/FLU/RSV testing.  Fact Sheet for Patients: EntrepreneurPulse.com.au  Fact Sheet for Healthcare Providers: IncredibleEmployment.be  This test is not yet approved or cleared by the [place] FDA and has been authorized for detection and/or diagnosis of SARS-CoV-2 by FDA under an Emergency Use Authorization (EUA). This EUA will remain in effect (meaning this test can be used) for the duration of  the COVID-19 declaration under Section 564(b)(1) of the Act, 21 U.S.C. section 360bbb-3(b)(1), unless the authorization  is terminated or revoked.  Performed at [facility], [address] [place], [place] [address]   Blood culture (routine x 2)     Status: None (Preliminary result)   Collection Time: 01/26/2021  8:05 AM   Specimen: BLOOD  Result Value Ref Range Status   Specimen Description   Final    BLOOD BLOOD LEFT FOREARM Performed at [facility], [address] [place], [place] [address]    Special Requests   Final    BOTTLES DRAWN AEROBIC AND ANAEROBIC Blood Culture adequate volume Performed at [facility], [address], [place], [place] [address]    Culture   Final    NO GROWTH 3 DAYS Performed at [facility], [place] [address], [place], [place] [address]    Report Status PENDING  Incomplete  MRSA Next Gen by PCR, Nasal     Status: None   Collection Time: 02/07/2021 11:04 PM   Specimen: Nasal Mucosa; Nasal Swab  Result Value Ref Range Status   MRSA by PCR Next Gen NOT DETECTED NOT DETECTED Final    Comment: (NOTE) The GeneXpert MRSA Assay (FDA approved for NASAL specimens only), is one component of a comprehensive MRSA colonization surveillance program. It is not intended to diagnose MRSA infection nor to guide or monitor treatment for MRSA infections. Test performance is not FDA approved in patients less than 58 years old. Performed at [facility], [place] [address], [place], [place] [address]     Lab Basic Metabolic Panel: Recent Labs  Lab 01/16/2021 0620 01/25/2021 2249 01/21/21 0057  NA 138  --  131*  K 4.3  --  3.9  CL 110  --  107  CO2 22  --  19*  GLUCOSE 90  --  121*  BUN 29*  --  27*  CREATININE 0.99 1.00 0.91  CALCIUM 8.8*  --  8.1*   Liver Function Tests: Recent Labs  Lab 01/29/2021 0620  AST 35  ALT 14  ALKPHOS 64  BILITOT 0.3  PROT 4.9*  ALBUMIN 2.2*   Recent Labs  Lab 01/24/2021 0620  LIPASE 40   No results for input(s): AMMONIA in the last 168 hours. CBC: Recent  Labs  Lab 02/08/2021 0620 01/27/2021 2249 01/21/21 0057 01/21/21 0717  WBC 2.1* 2.6* 3.3*  --   NEUTROABS 1.8  --   --   --   HGB 7.5* 7.0* 6.5* 9.4*  HCT 23.4* 21.7* 20.8* 29.1*  MCV 82.1 84.1 83.5  --   PLT 191 158 148*  --    Cardiac Enzymes: Recent Labs  Lab 02/03/2021 0620  CKTOTAL 91   Sepsis Labs: Recent Labs  Lab 02/05/2021 0620 01/19/2021 0841 01/21/2021 1208 01/19/2021 1753 01/19/2021 2249 01/21/21 0057  WBC 2.1*  --   --   --  2.6* 3.3*  LATICACIDVEN 1.0   < > 1.3 1.6 2.1* 2.6*   < > = values in this interval not displayed.    Procedures/Operations     [employer] 02-01-2021, 8:46 PM

## 2021-02-10 NOTE — Progress Notes (Signed)
Pt expired at 10:55am. Passing verified by 2 RNs at bedside. MD notified. Wife notified and reported she will be here in about 30 minutes with daughter. She requested chaplain be at bedside when present. PIV x2 and foley removed by CNA.

## 2021-02-10 DEATH — deceased

## 2021-07-02 IMAGING — CT CT ABD-PELV W/O CM
2 of 4 series · 16 of 46 positions shown, 18 images · non-contrast
Comparison: None.

CLINICAL DATA: Gross hematuria

EXAM:
CT ABDOMEN AND PELVIS WITHOUT CONTRAST
TECHNIQUE: Multidetector CT imaging of the abdomen and pelvis was performed
following the standard protocol without IV contrast.

[Series 3: ap without · axial · non-contrast · 0.82mm/px · z∈[+769,+1184]mm · 13 of 93 slices shown, 15 images]
[im 5/93  soft-tissue]
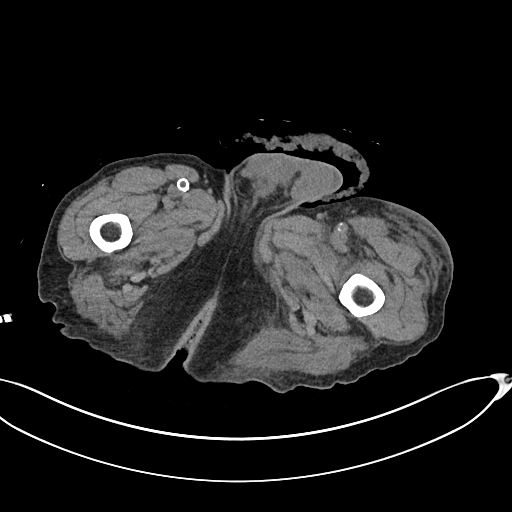
[im 5/93  bone]
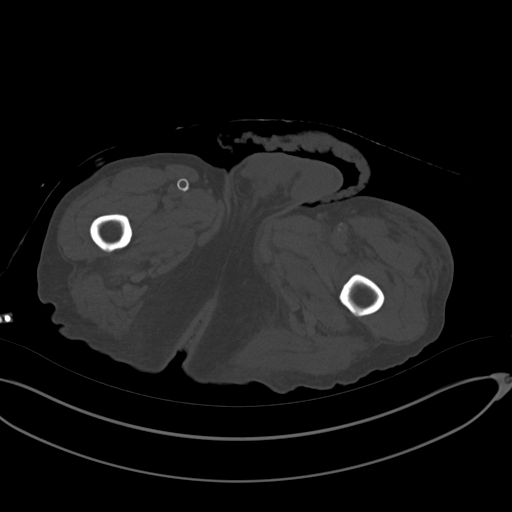
[im 15/93  soft-tissue]
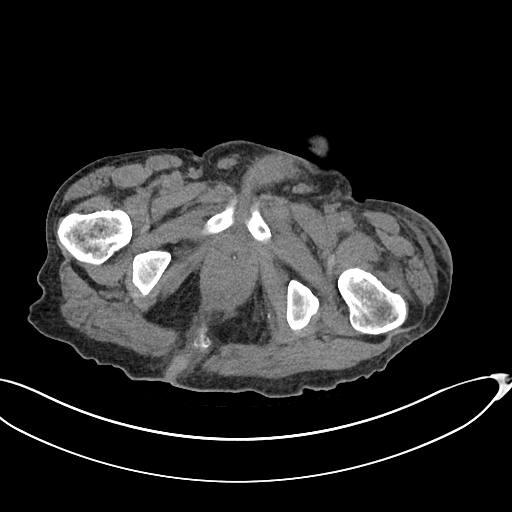
[im 20/93  soft-tissue]
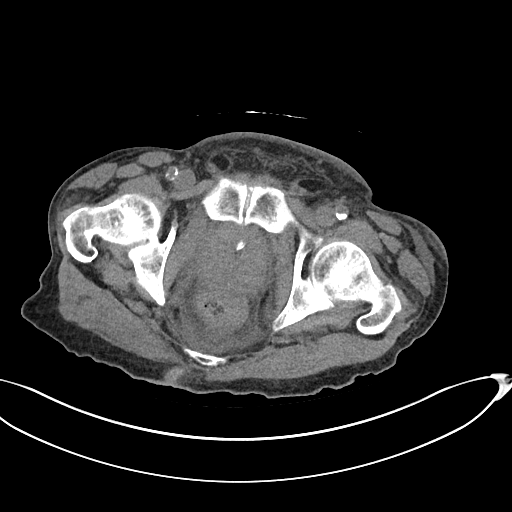
[im 25/93  soft-tissue]
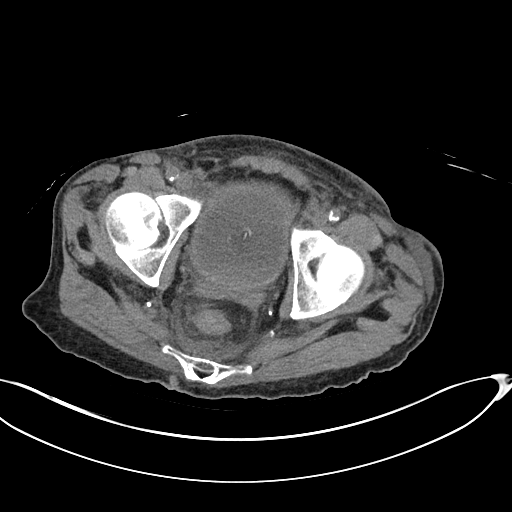
[im 34/93  soft-tissue]
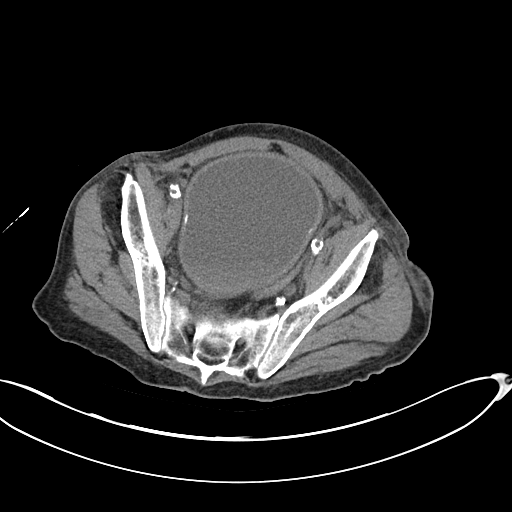
[im 39/93  soft-tissue]
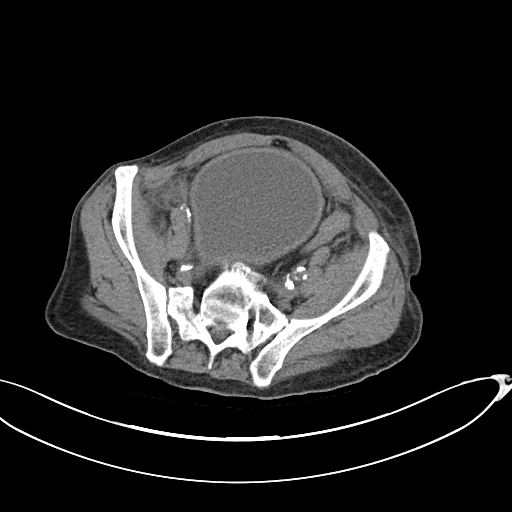
[im 49/93  soft-tissue]
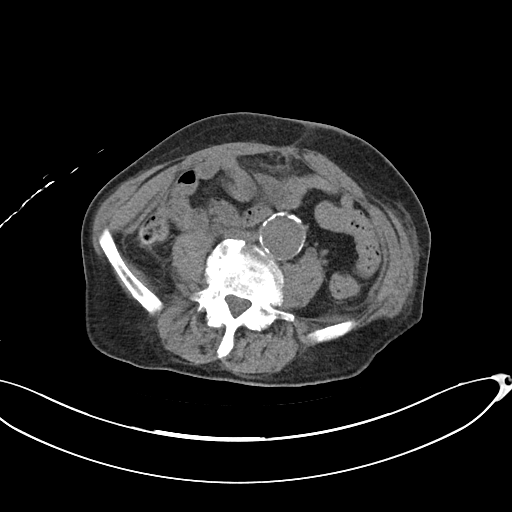
[im 54/93  soft-tissue]
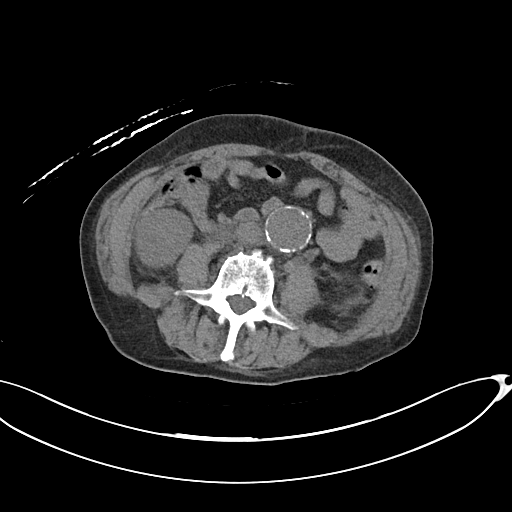
[im 59/93  soft-tissue]
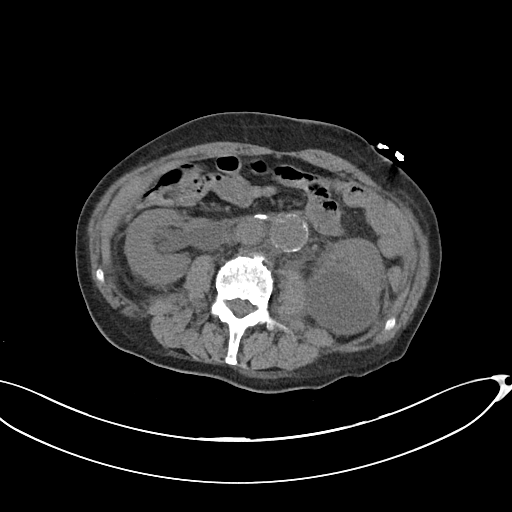
[im 59/93  bone]
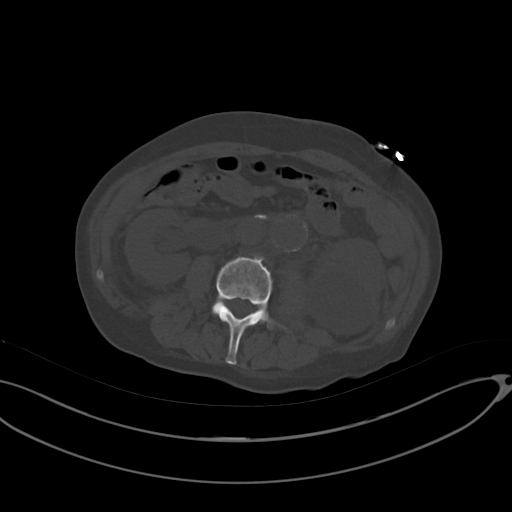
[im 68/93  soft-tissue]
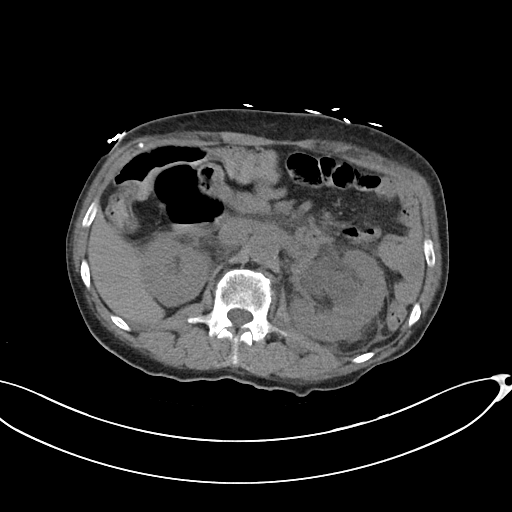
[im 73/93  soft-tissue]
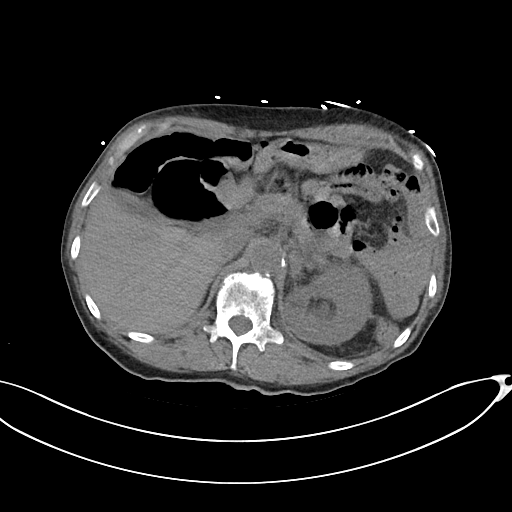
[im 78/93  soft-tissue]
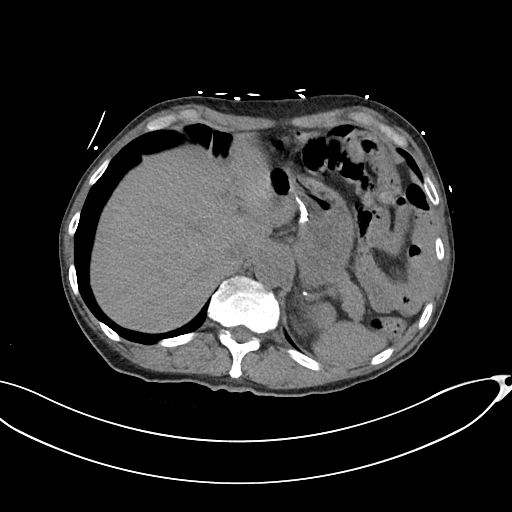
[im 88/93  soft-tissue]
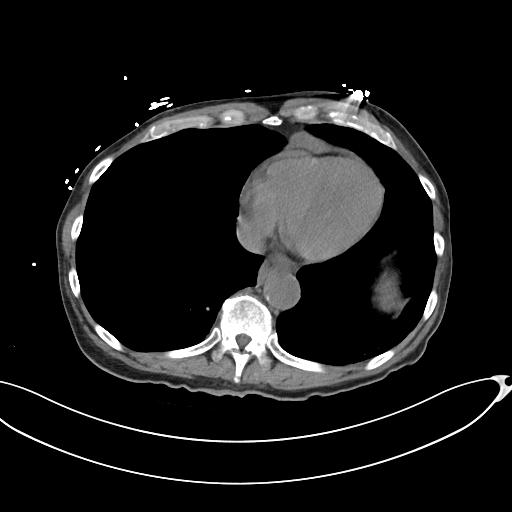

[Series 6: cor · coronal · 0.82mm/px · 3 of 96 slices shown]
[im 32/96  soft-tissue]
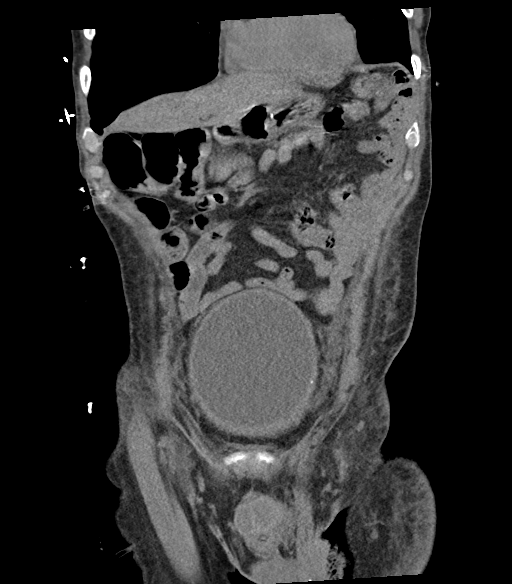
[im 43/96  soft-tissue]
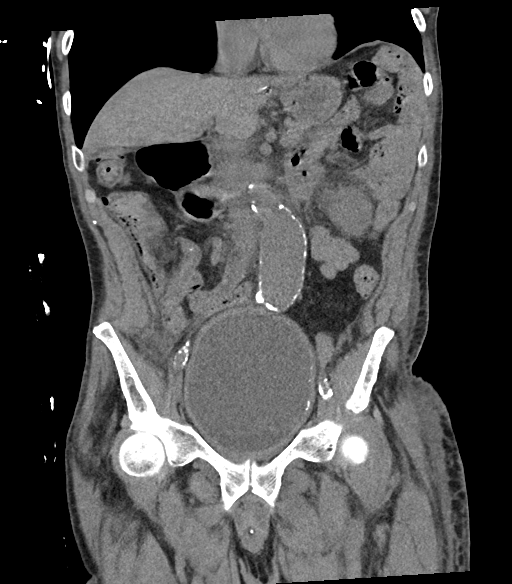
[im 53/96  soft-tissue]
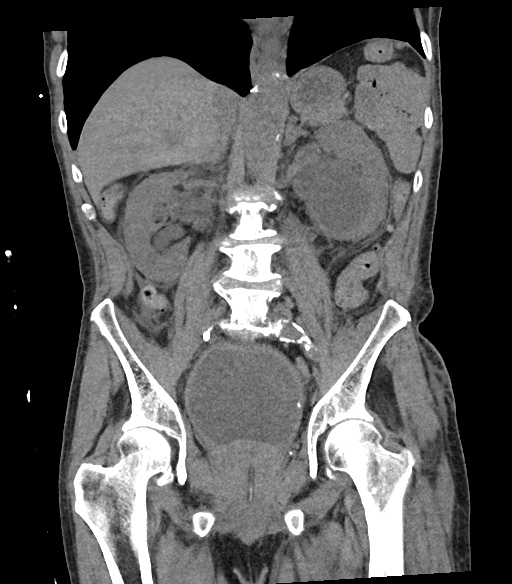

[16 of 46 positions shown; findings below may reference images not displayed]

FINDINGS: Lower chest: No acute pleural or parenchymal lung disease.

Hepatobiliary: Gallbladder is decompressed which limits its
evaluation. Unenhanced imaging of the liver is unremarkable.

Pancreas: Unremarkable. No pancreatic ductal dilatation or
surrounding inflammatory changes.

Spleen: Normal in size without focal abnormality.

Adrenals/Urinary Tract: There is bilateral obstructive uropathy,
left greater than right. I do not see an obstructing calculus.
Distension of the ureters and renal collecting systems could be due
to chronic bladder outlet obstruction given the degree of bladder
distention on this exam.

Diffuse mural calcifications are seen throughout the bladder. No
definite bladder wall mass or filling defect. Foley catheter is seen
within the bladder lumen.

The adrenals are unremarkable.

Stomach/Bowel: No bowel obstruction or ileus. Normal appendix right
lower quadrant. No wall thickening or inflammatory changes.

Vascular/Lymphatic: There is aneurysmal dilatation of the infrarenal
abdominal aorta, measuring up to 4 cm in anterior-posterior
dimension. Diffuse calcified atheromatous plaque throughout the
aorta and its branches.

No pathologic adenopathy within the abdomen or pelvis.

Reproductive: Prostate is enlarged, measuring 5.4 x 5.4 cm in
transverse direction.

Other: There is trace free fluid in the pelvis, nonspecific.
Perirectal fat stranding is noted, also nonspecific.

Musculoskeletal: No acute or destructive bony lesions. Reconstructed
images demonstrate no additional findings.
IMPRESSION: 1. Bilateral obstructive uropathy, left greater than right, which
may be due to bladder distension and chronic bladder outlet
obstruction. No evidence of urinary tract calculi.
2. Distended bladder despite Foley catheter. Please correlate with
catheter function. Diffuse mural calcifications of the bladder
without filling defect or mass.
3. Enlarged prostate.
4. 4 cm infrarenal abdominal aortic aneurysm. Recommend followup by
ultrasound in 1 year. This recommendation follows ACR consensus
guidelines: White Paper of the ACR Incidental Findings Committee II
aneurysm NOS ([id number]-T7X.I)
5. Trace pelvic free fluid.
6. Perirectal fat stranding of uncertain significance.
